# Patient Record
Sex: Male | Born: 1960
Health system: Southern US, Community
[De-identification: ages and names within clinical notes are randomized; demographics above are authoritative.]

## PROBLEM LIST (undated history)

## (undated) DIAGNOSIS — F1910 Other psychoactive substance abuse, uncomplicated: Secondary | ICD-10-CM

## (undated) DIAGNOSIS — I10 Essential (primary) hypertension: Secondary | ICD-10-CM

## (undated) DIAGNOSIS — F419 Anxiety disorder, unspecified: Secondary | ICD-10-CM

## (undated) DIAGNOSIS — K219 Gastro-esophageal reflux disease without esophagitis: Secondary | ICD-10-CM

## (undated) DIAGNOSIS — I2510 Atherosclerotic heart disease of native coronary artery without angina pectoris: Secondary | ICD-10-CM

## (undated) DIAGNOSIS — Z951 Presence of aortocoronary bypass graft: Secondary | ICD-10-CM

## (undated) DIAGNOSIS — F1010 Alcohol abuse, uncomplicated: Secondary | ICD-10-CM

## (undated) DIAGNOSIS — I219 Acute myocardial infarction, unspecified: Secondary | ICD-10-CM

## (undated) DIAGNOSIS — I6529 Occlusion and stenosis of unspecified carotid artery: Secondary | ICD-10-CM

## (undated) DIAGNOSIS — E785 Hyperlipidemia, unspecified: Secondary | ICD-10-CM

## (undated) DIAGNOSIS — I739 Peripheral vascular disease, unspecified: Secondary | ICD-10-CM

## (undated) DIAGNOSIS — I509 Heart failure, unspecified: Secondary | ICD-10-CM

## (undated) DIAGNOSIS — I5042 Chronic combined systolic (congestive) and diastolic (congestive) heart failure: Secondary | ICD-10-CM

## (undated) DIAGNOSIS — R911 Solitary pulmonary nodule: Secondary | ICD-10-CM

## (undated) DIAGNOSIS — I6521 Occlusion and stenosis of right carotid artery: Secondary | ICD-10-CM

## (undated) DIAGNOSIS — I714 Abdominal aortic aneurysm, without rupture: Secondary | ICD-10-CM

## (undated) DIAGNOSIS — I639 Cerebral infarction, unspecified: Secondary | ICD-10-CM

## (undated) HISTORY — DX: Other psychoactive substance abuse, uncomplicated: F19.10

## (undated) HISTORY — DX: Abdominal aortic aneurysm, without rupture: I71.4

## (undated) HISTORY — DX: Peripheral vascular disease, unspecified: I73.9

## (undated) HISTORY — DX: Acute myocardial infarction, unspecified: I21.9

## (undated) HISTORY — DX: Essential (primary) hypertension: I10

## (undated) HISTORY — DX: Occlusion and stenosis of right carotid artery: I65.21

## (undated) HISTORY — DX: Occlusion and stenosis of unspecified carotid artery: I65.29

## (undated) HISTORY — DX: Alcohol abuse, uncomplicated: F10.10

## (undated) HISTORY — DX: Hyperlipidemia, unspecified: E78.5

## (undated) HISTORY — DX: Chronic combined systolic (congestive) and diastolic (congestive) heart failure: I50.42

---

## 2001-01-09 ENCOUNTER — Emergency Department (HOSPITAL_COMMUNITY): Admission: EM | Admit: 2001-01-09 | Discharge: 2001-01-09 | Payer: Self-pay

## 2001-01-10 ENCOUNTER — Emergency Department (HOSPITAL_COMMUNITY): Admission: EM | Admit: 2001-01-10 | Discharge: 2001-01-10 | Payer: Self-pay

## 2001-01-11 ENCOUNTER — Emergency Department (HOSPITAL_COMMUNITY): Admission: EM | Admit: 2001-01-11 | Discharge: 2001-01-11 | Payer: Self-pay | Admitting: Emergency Medicine

## 2001-01-12 ENCOUNTER — Emergency Department (HOSPITAL_COMMUNITY): Admission: EM | Admit: 2001-01-12 | Discharge: 2001-01-12 | Payer: Self-pay | Admitting: Emergency Medicine

## 2002-06-24 ENCOUNTER — Encounter: Payer: Self-pay | Admitting: Emergency Medicine

## 2002-06-24 ENCOUNTER — Emergency Department (HOSPITAL_COMMUNITY): Admission: EM | Admit: 2002-06-24 | Discharge: 2002-06-24 | Payer: Self-pay | Admitting: Emergency Medicine

## 2003-03-08 ENCOUNTER — Emergency Department (HOSPITAL_COMMUNITY): Admission: EM | Admit: 2003-03-08 | Discharge: 2003-03-08 | Payer: Self-pay | Admitting: Emergency Medicine

## 2004-02-25 ENCOUNTER — Emergency Department (HOSPITAL_COMMUNITY): Admission: EM | Admit: 2004-02-25 | Discharge: 2004-02-25 | Payer: Self-pay | Admitting: Emergency Medicine

## 2004-07-08 ENCOUNTER — Emergency Department (HOSPITAL_COMMUNITY): Admission: EM | Admit: 2004-07-08 | Discharge: 2004-07-09 | Payer: Self-pay | Admitting: Emergency Medicine

## 2006-08-25 ENCOUNTER — Emergency Department (HOSPITAL_COMMUNITY): Admission: EM | Admit: 2006-08-25 | Discharge: 2006-08-25 | Payer: Self-pay | Admitting: Emergency Medicine

## 2006-10-03 ENCOUNTER — Emergency Department (HOSPITAL_COMMUNITY): Admission: EM | Admit: 2006-10-03 | Discharge: 2006-10-03 | Payer: Self-pay | Admitting: Emergency Medicine

## 2008-06-30 DIAGNOSIS — I219 Acute myocardial infarction, unspecified: Secondary | ICD-10-CM

## 2008-06-30 DIAGNOSIS — Z951 Presence of aortocoronary bypass graft: Secondary | ICD-10-CM

## 2008-06-30 HISTORY — PX: CORONARY ARTERY BYPASS GRAFT: SHX141

## 2008-06-30 HISTORY — DX: Acute myocardial infarction, unspecified: I21.9

## 2008-06-30 HISTORY — DX: Presence of aortocoronary bypass graft: Z95.1

## 2008-10-05 ENCOUNTER — Inpatient Hospital Stay (HOSPITAL_COMMUNITY): Admission: EM | Admit: 2008-10-05 | Discharge: 2008-10-15 | Payer: Self-pay | Admitting: Emergency Medicine

## 2008-10-05 ENCOUNTER — Ambulatory Visit: Payer: Self-pay | Admitting: Surgery

## 2008-10-05 ENCOUNTER — Ambulatory Visit: Payer: Self-pay | Admitting: Internal Medicine

## 2008-10-06 ENCOUNTER — Encounter: Payer: Self-pay | Admitting: Internal Medicine

## 2008-10-06 ENCOUNTER — Encounter: Payer: Self-pay | Admitting: Surgery

## 2008-10-10 ENCOUNTER — Encounter: Payer: Self-pay | Admitting: Surgery

## 2008-10-11 ENCOUNTER — Encounter: Payer: Self-pay | Admitting: Internal Medicine

## 2008-10-11 ENCOUNTER — Encounter: Payer: Self-pay | Admitting: Surgery

## 2008-10-19 DIAGNOSIS — F1910 Other psychoactive substance abuse, uncomplicated: Secondary | ICD-10-CM

## 2008-10-19 DIAGNOSIS — E785 Hyperlipidemia, unspecified: Secondary | ICD-10-CM

## 2008-10-19 DIAGNOSIS — D62 Acute posthemorrhagic anemia: Secondary | ICD-10-CM

## 2008-10-19 DIAGNOSIS — I25810 Atherosclerosis of coronary artery bypass graft(s) without angina pectoris: Secondary | ICD-10-CM | POA: Insufficient documentation

## 2008-10-19 DIAGNOSIS — I10 Essential (primary) hypertension: Secondary | ICD-10-CM | POA: Insufficient documentation

## 2008-10-19 DIAGNOSIS — M5137 Other intervertebral disc degeneration, lumbosacral region: Secondary | ICD-10-CM

## 2008-10-19 DIAGNOSIS — R079 Chest pain, unspecified: Secondary | ICD-10-CM

## 2008-10-19 HISTORY — DX: Atherosclerosis of coronary artery bypass graft(s) without angina pectoris: I25.810

## 2008-10-20 ENCOUNTER — Ambulatory Visit (HOSPITAL_COMMUNITY): Admission: RE | Admit: 2008-10-20 | Discharge: 2008-10-20 | Payer: Self-pay | Admitting: Surgery

## 2008-10-20 ENCOUNTER — Encounter: Payer: Self-pay | Admitting: Surgery

## 2008-10-30 ENCOUNTER — Ambulatory Visit: Payer: Self-pay | Admitting: Surgery

## 2008-11-07 ENCOUNTER — Encounter: Admission: RE | Admit: 2008-11-07 | Discharge: 2008-11-07 | Payer: Self-pay | Admitting: Surgery

## 2008-11-07 ENCOUNTER — Ambulatory Visit: Payer: Self-pay | Admitting: Surgery

## 2008-11-07 ENCOUNTER — Encounter: Payer: Self-pay | Admitting: Cardiology

## 2008-11-13 ENCOUNTER — Ambulatory Visit: Payer: Self-pay | Admitting: Cardiovascular Disease

## 2008-11-21 ENCOUNTER — Telehealth: Payer: Self-pay | Admitting: Cardiovascular Disease

## 2008-12-12 ENCOUNTER — Emergency Department (HOSPITAL_COMMUNITY): Admission: EM | Admit: 2008-12-12 | Discharge: 2008-12-13 | Payer: Self-pay | Admitting: Emergency Medicine

## 2009-01-16 ENCOUNTER — Encounter: Payer: Self-pay | Admitting: Cardiology

## 2009-12-13 ENCOUNTER — Emergency Department (HOSPITAL_COMMUNITY): Admission: EM | Admit: 2009-12-13 | Discharge: 2009-12-13 | Payer: Self-pay | Admitting: Emergency Medicine

## 2010-03-02 ENCOUNTER — Ambulatory Visit: Payer: Self-pay | Admitting: Interventional Cardiology

## 2010-03-03 ENCOUNTER — Inpatient Hospital Stay (HOSPITAL_COMMUNITY): Admission: EM | Admit: 2010-03-03 | Discharge: 2010-03-04 | Payer: Self-pay | Admitting: Emergency Medicine

## 2010-03-07 ENCOUNTER — Encounter (INDEPENDENT_AMBULATORY_CARE_PROVIDER_SITE_OTHER): Payer: Self-pay | Admitting: *Deleted

## 2010-04-19 ENCOUNTER — Emergency Department (HOSPITAL_COMMUNITY): Admission: EM | Admit: 2010-04-19 | Discharge: 2010-04-19 | Payer: Self-pay | Admitting: Emergency Medicine

## 2010-07-30 NOTE — Letter (Signed)
Summary: Appointment - Missed  [facility], Main Office  [address]   [place], [place] [address]   Phone: [phone]  Fax: [phone]     March 07, 2010 MRN: [id number]   [facility] [address] [place], [place]  [address]   Dear Mr. Grob,  Our records indicate we need to contact you to make an appointment with Dr. Clifton James. It is very important that we reach you to schedule this appointment to follow up on your care since your hospital stay. We look forward to participating in your health care needs. Please contact us at the number listed above at your earliest convenience to reschedule this appointment.     Sincerely, Neurosurgeon Team LG

## 2010-09-12 LAB — DIFFERENTIAL
Eosinophils Absolute: 0.1 10*3/uL (ref 0.0–0.7)
Lymphs Abs: 2 10*3/uL (ref 0.7–4.0)
Neutro Abs: 2.6 10*3/uL (ref 1.7–7.7)
Neutrophils Relative %: 51 % (ref 43–77)

## 2010-09-12 LAB — CBC
MCH: 30.8 pg (ref 26.0–34.0)
Platelets: 200 10*3/uL (ref 150–400)
RBC: 5.81 MIL/uL (ref 4.22–5.81)
WBC: 5 10*3/uL (ref 4.0–10.5)

## 2010-09-12 LAB — POCT CARDIAC MARKERS: Troponin i, poc: <0.05 ng/mL (ref 0.00–0.09)

## 2010-09-12 LAB — BASIC METABOLIC PANEL
Calcium: 9.3 mg/dL (ref 8.4–10.5)
Creatinine, Ser: 0.96 mg/dL (ref 0.4–1.5)
GFR calc Af Amer: >60 mL/min (ref >60)
GFR calc non Af Amer: >60 mL/min (ref >60)

## 2010-09-12 LAB — TROPONIN I: Troponin I: 0.01 ng/mL (ref 0.00–0.06)

## 2010-09-12 LAB — LIPID PANEL
LDL Cholesterol: 214 mg/dL — ABNORMAL HIGH (ref 0–99)
VLDL: 20 mg/dL (ref 0–40)

## 2010-09-12 LAB — D-DIMER, QUANTITATIVE: D-Dimer, Quant: <0.22 ug/mL-FEU (ref 0.00–0.48)

## 2010-09-12 LAB — CARDIAC PANEL(CRET KIN+CKTOT+MB+TROPI)
CK, MB: 2.1 ng/mL (ref 0.3–4.0)
Relative Index: INVALID (ref 0.0–2.5)
Total CK: 42 U/L (ref 7–232)
Total CK: 45 U/L (ref 7–232)
Troponin I: 0.01 ng/mL (ref 0.00–0.06)

## 2010-09-12 LAB — MRSA PCR SCREENING: MRSA by PCR: NEGATIVE

## 2010-09-15 LAB — DIFFERENTIAL
Basophils Relative: 1 % (ref 0–1)
Eosinophils Absolute: 0.2 10*3/uL (ref 0.0–0.7)
Neutrophils Relative %: 48 % (ref 43–77)

## 2010-09-15 LAB — CBC
Platelets: 166 10*3/uL (ref 150–400)
WBC: 6.2 10*3/uL (ref 4.0–10.5)

## 2010-09-15 LAB — PROTIME-INR
INR: 1.06 (ref 0.00–1.49)
Prothrombin Time: 13.7 seconds (ref 11.6–15.2)

## 2010-09-15 LAB — CK TOTAL AND CKMB (NOT AT ARMC)
CK, MB: 1.6 ng/mL (ref 0.3–4.0)
Relative Index: INVALID (ref 0.0–2.5)

## 2010-09-15 LAB — TROPONIN I: Troponin I: 0.01 ng/mL (ref 0.00–0.06)

## 2010-09-15 LAB — APTT: aPTT: 29 seconds (ref 24–37)

## 2010-09-15 LAB — BASIC METABOLIC PANEL
BUN: 6 mg/dL (ref 6–23)
Calcium: 9.1 mg/dL (ref 8.4–10.5)
Creatinine, Ser: 0.91 mg/dL (ref 0.4–1.5)
GFR calc non Af Amer: >60 mL/min (ref >60)

## 2010-10-07 LAB — POCT I-STAT, CHEM 8
BUN: 10 mg/dL (ref 6–23)
Creatinine, Ser: 1.2 mg/dL (ref 0.4–1.5)
Glucose, Bld: 85 mg/dL (ref 70–99)
Hemoglobin: 14.6 g/dL (ref 13.0–17.0)
TCO2: 19 mmol/L (ref 0–100)

## 2010-10-07 LAB — CBC
Hemoglobin: 14.1 g/dL (ref 13.0–17.0)
MCHC: 32.8 g/dL (ref 30.0–36.0)
MCV: 86.4 fL (ref 78.0–100.0)
RBC: 4.97 MIL/uL (ref 4.22–5.81)
WBC: 6.2 10*3/uL (ref 4.0–10.5)

## 2010-10-07 LAB — POCT CARDIAC MARKERS: CKMB, poc: <1 ng/mL — ABNORMAL LOW (ref 1.0–8.0)

## 2010-10-09 LAB — BASIC METABOLIC PANEL
CO2: 27 mEq/L (ref 19–32)
CO2: 32 mEq/L (ref 19–32)
CO2: 28 mEq/L (ref 19–32)
CO2: 29 mEq/L (ref 19–32)
CO2: 30 mEq/L (ref 19–32)
Calcium: 8.6 mg/dL (ref 8.4–10.5)
Calcium: 9.4 mg/dL (ref 8.4–10.5)
Chloride: 100 mEq/L (ref 96–112)
Chloride: 105 mEq/L (ref 96–112)
Chloride: 103 mEq/L (ref 96–112)
Chloride: 108 mEq/L (ref 96–112)
Creatinine, Ser: 0.77 mg/dL (ref 0.4–1.5)
Creatinine, Ser: 0.92 mg/dL (ref 0.4–1.5)
Creatinine, Ser: 1.19 mg/dL (ref 0.4–1.5)
GFR calc Af Amer: >60 mL/min (ref >60)
GFR calc Af Amer: >60 mL/min (ref >60)
GFR calc Af Amer: >60 mL/min (ref >60)
GFR calc Af Amer: >60 mL/min (ref >60)
GFR calc Af Amer: >60 mL/min (ref >60)
GFR calc non Af Amer: 60 mL/min — ABNORMAL LOW (ref >60)
GFR calc non Af Amer: >60 mL/min (ref >60)
GFR calc non Af Amer: >60 mL/min (ref >60)
Potassium: 3.5 mEq/L (ref 3.5–5.1)
Potassium: 4.1 mEq/L (ref 3.5–5.1)
Potassium: 4 mEq/L (ref 3.5–5.1)
Potassium: 4.2 mEq/L (ref 3.5–5.1)
Sodium: 137 mEq/L (ref 135–145)
Sodium: 136 mEq/L (ref 135–145)
Sodium: 139 mEq/L (ref 135–145)
Sodium: 139 mEq/L (ref 135–145)

## 2010-10-09 LAB — HEPATIC FUNCTION PANEL
ALT: 16 U/L (ref 0–53)
AST: 17 U/L (ref 0–37)
Albumin: 3.5 g/dL (ref 3.5–5.2)
Alkaline Phosphatase: 80 U/L (ref 39–117)
Total Protein: 6.2 g/dL (ref 6.0–8.3)

## 2010-10-09 LAB — POCT I-STAT 4, (NA,K, GLUC, HGB,HCT)
Glucose, Bld: 86 mg/dL (ref 70–99)
Glucose, Bld: 89 mg/dL (ref 70–99)
Glucose, Bld: 97 mg/dL (ref 70–99)
Glucose, Bld: 99 mg/dL (ref 70–99)
HCT: 28 % — ABNORMAL LOW (ref 39.0–52.0)
HCT: 29 % — ABNORMAL LOW (ref 39.0–52.0)
HCT: 31 % — ABNORMAL LOW (ref 39.0–52.0)
HCT: 42 % (ref 39.0–52.0)
Hemoglobin: 10.5 g/dL — ABNORMAL LOW (ref 13.0–17.0)
Hemoglobin: 14.3 g/dL (ref 13.0–17.0)
Hemoglobin: 15 g/dL (ref 13.0–17.0)
Hemoglobin: 9.5 g/dL — ABNORMAL LOW (ref 13.0–17.0)
Potassium: 4.4 mEq/L (ref 3.5–5.1)
Potassium: 4.5 mEq/L (ref 3.5–5.1)
Potassium: 5.5 mEq/L — ABNORMAL HIGH (ref 3.5–5.1)
Potassium: 5.5 mEq/L — ABNORMAL HIGH (ref 3.5–5.1)
Potassium: 6.1 mEq/L — ABNORMAL HIGH (ref 3.5–5.1)
Sodium: 135 mEq/L (ref 135–145)
Sodium: 139 mEq/L (ref 135–145)

## 2010-10-09 LAB — RAPID URINE DRUG SCREEN, HOSP PERFORMED
Amphetamines: NOT DETECTED
Barbiturates: NOT DETECTED
Benzodiazepines: NOT DETECTED
Tetrahydrocannabinol: POSITIVE — AB

## 2010-10-09 LAB — GLUCOSE, CAPILLARY
Glucose-Capillary: 102 mg/dL — ABNORMAL HIGH (ref 70–99)
Glucose-Capillary: 104 mg/dL — ABNORMAL HIGH (ref 70–99)
Glucose-Capillary: 109 mg/dL — ABNORMAL HIGH (ref 70–99)
Glucose-Capillary: 112 mg/dL — ABNORMAL HIGH (ref 70–99)
Glucose-Capillary: 121 mg/dL — ABNORMAL HIGH (ref 70–99)
Glucose-Capillary: 104 mg/dL — ABNORMAL HIGH (ref 70–99)
Glucose-Capillary: 116 mg/dL — ABNORMAL HIGH (ref 70–99)
Glucose-Capillary: 125 mg/dL — ABNORMAL HIGH (ref 70–99)
Glucose-Capillary: 98 mg/dL (ref 70–99)

## 2010-10-09 LAB — URINALYSIS, ROUTINE W REFLEX MICROSCOPIC
Bilirubin Urine: NEGATIVE
Glucose, UA: NEGATIVE mg/dL
Hgb urine dipstick: NEGATIVE
Protein, ur: NEGATIVE mg/dL
Urobilinogen, UA: 0.2 mg/dL (ref 0.0–1.0)

## 2010-10-09 LAB — CROSSMATCH: ABO/RH(D): O POS

## 2010-10-09 LAB — CREATININE, SERUM
Creatinine, Ser: 0.9 mg/dL (ref 0.4–1.5)
Creatinine, Ser: 1.01 mg/dL (ref 0.4–1.5)
GFR calc Af Amer: >60 mL/min (ref >60)
GFR calc Af Amer: >60 mL/min (ref >60)
GFR calc non Af Amer: >60 mL/min (ref >60)

## 2010-10-09 LAB — POCT I-STAT 3, ART BLOOD GAS (G3+)
Acid-base deficit: 2 mmol/L (ref 0.0–2.0)
Bicarbonate: 23.9 mEq/L (ref 20.0–24.0)
Bicarbonate: 24.1 mEq/L — ABNORMAL HIGH (ref 20.0–24.0)
Bicarbonate: 24.6 mEq/L — ABNORMAL HIGH (ref 20.0–24.0)
O2 Saturation: 100 %
O2 Saturation: 96 %
O2 Saturation: 97 %
Patient temperature: 34.7
Patient temperature: 36.3
Patient temperature: 36.3
TCO2: 25 mmol/L (ref 0–100)
TCO2: 25 mmol/L (ref 0–100)
TCO2: 26 mmol/L (ref 0–100)
TCO2: 26 mmol/L (ref 0–100)
pCO2 arterial: 41.9 mmHg (ref 35.0–45.0)
pCO2 arterial: 44.5 mmHg (ref 35.0–45.0)
pH, Arterial: 7.353 (ref 7.350–7.450)
pO2, Arterial: 61 mmHg — ABNORMAL LOW (ref 80.0–100.0)

## 2010-10-09 LAB — CBC
HCT: 26.8 % — ABNORMAL LOW (ref 39.0–52.0)
HCT: 29.6 % — ABNORMAL LOW (ref 39.0–52.0)
HCT: 46.3 % (ref 39.0–52.0)
Hemoglobin: 9.5 g/dL — ABNORMAL LOW (ref 13.0–17.0)
Hemoglobin: 10.5 g/dL — ABNORMAL LOW (ref 13.0–17.0)
Hemoglobin: 15.9 g/dL (ref 13.0–17.0)
Hemoglobin: 16.4 g/dL (ref 13.0–17.0)
Hemoglobin: 9.8 g/dL — ABNORMAL LOW (ref 13.0–17.0)
MCHC: 35.6 g/dL (ref 30.0–36.0)
MCHC: 33 g/dL (ref 30.0–36.0)
MCHC: 34.6 g/dL (ref 30.0–36.0)
MCHC: 35 g/dL (ref 30.0–36.0)
MCV: 88.7 fL (ref 78.0–100.0)
MCV: 88.1 fL (ref 78.0–100.0)
MCV: 89 fL (ref 78.0–100.0)
MCV: 89.1 fL (ref 78.0–100.0)
Platelets: 133 10*3/uL — ABNORMAL LOW (ref 150–400)
Platelets: 135 10*3/uL — ABNORMAL LOW (ref 150–400)
Platelets: 143 10*3/uL — ABNORMAL LOW (ref 150–400)
RBC: 3.02 MIL/uL — ABNORMAL LOW (ref 4.22–5.81)
RBC: 3.07 MIL/uL — ABNORMAL LOW (ref 4.22–5.81)
RBC: 3.16 MIL/uL — ABNORMAL LOW (ref 4.22–5.81)
RBC: 3.2 MIL/uL — ABNORMAL LOW (ref 4.22–5.81)
RBC: 5.1 MIL/uL (ref 4.22–5.81)
RBC: 5.11 MIL/uL (ref 4.22–5.81)
RDW: 13.9 % (ref 11.5–15.5)
WBC: 7.3 10*3/uL (ref 4.0–10.5)
WBC: 5.8 10*3/uL (ref 4.0–10.5)
WBC: 6.4 10*3/uL (ref 4.0–10.5)
WBC: 7.5 10*3/uL (ref 4.0–10.5)
WBC: 7.5 10*3/uL (ref 4.0–10.5)
WBC: 7.6 10*3/uL (ref 4.0–10.5)
WBC: 8.2 10*3/uL (ref 4.0–10.5)

## 2010-10-09 LAB — MAGNESIUM
Magnesium: 2.2 mg/dL (ref 1.5–2.5)
Magnesium: 2.2 mg/dL (ref 1.5–2.5)

## 2010-10-09 LAB — POCT I-STAT GLUCOSE
Operator id: 173791
Operator id: 3406

## 2010-10-09 LAB — CARDIAC PANEL(CRET KIN+CKTOT+MB+TROPI)
CK, MB: 1.3 ng/mL (ref 0.3–4.0)
CK, MB: 1.5 ng/mL (ref 0.3–4.0)
Relative Index: INVALID (ref 0.0–2.5)
Relative Index: INVALID (ref 0.0–2.5)
Relative Index: INVALID (ref 0.0–2.5)
Total CK: 40 U/L (ref 7–232)
Total CK: 43 U/L (ref 7–232)
Total CK: 44 U/L (ref 7–232)
Total CK: 46 U/L (ref 7–232)
Troponin I: <0.01 ng/mL (ref 0.00–0.06)
Troponin I: <0.01 ng/mL (ref 0.00–0.06)
Troponin I: <0.01 ng/mL (ref 0.00–0.06)

## 2010-10-09 LAB — PREPARE PLATELETS

## 2010-10-09 LAB — POCT I-STAT, CHEM 8
BUN: 9 mg/dL (ref 6–23)
Calcium, Ion: 1.12 mmol/L (ref 1.12–1.32)
Chloride: 105 mEq/L (ref 96–112)
Creatinine, Ser: 1.2 mg/dL (ref 0.4–1.5)
Sodium: 137 mEq/L (ref 135–145)

## 2010-10-09 LAB — DIFFERENTIAL
Basophils Absolute: 0.1 10*3/uL (ref 0.0–0.1)
Eosinophils Relative: 5 % (ref 0–5)
Lymphocytes Relative: 25 % (ref 12–46)
Lymphs Abs: 1.9 10*3/uL (ref 0.7–4.0)
Monocytes Absolute: 0.5 10*3/uL (ref 0.1–1.0)
Neutro Abs: 4.7 10*3/uL (ref 1.7–7.7)

## 2010-10-09 LAB — LIPID PANEL
Cholesterol: 287 mg/dL — ABNORMAL HIGH (ref 0–200)
LDL Cholesterol: 206 mg/dL — ABNORMAL HIGH (ref 0–99)
Total CHOL/HDL Ratio: 8 RATIO
Triglycerides: 225 mg/dL — ABNORMAL HIGH (ref <150)

## 2010-10-09 LAB — POCT CARDIAC MARKERS
Myoglobin, poc: 34 ng/mL (ref 12–200)
Myoglobin, poc: 53.1 ng/mL (ref 12–200)
Troponin i, poc: <0.05 ng/mL (ref 0.00–0.09)

## 2010-10-09 LAB — CK TOTAL AND CKMB (NOT AT ARMC): Total CK: 54 U/L (ref 7–232)

## 2010-10-09 LAB — PLATELET COUNT: Platelets: 120 10*3/uL — ABNORMAL LOW (ref 150–400)

## 2010-11-12 NOTE — Consult Note (Signed)
NAMEHAMMAD, FINKLER NO.:  [id number]   MEDICAL RECORD NO.:  [id number]          PATIENT TYPE:  INP   LOCATION:  4734                         FACILITY:  [facility]   PHYSICIAN:  Evelene Croon, M.D.     DATE OF BIRTH:  1960/07/29   DATE OF CONSULTATION:  10/07/2008  DATE OF DISCHARGE:                                 CONSULTATION   REFERRING PHYSICIAN:  Arturo Morton. Riley Kill, MD, [facility]   REASON FOR CONSULTATION:  Severe three-vessel coronary artery disease.   CLINICAL HISTORY:  I was asked by Dr. Riley Kill to evaluate Mr. Rehberg for  consideration of coronary artery bypass graft surgery.  He is a 50-year-  old gentleman with a history of cigarette smoking as well as  polysubstance abuse including marijuana, ethanol, and cocaine.  He  reports having a several-month history of intermittent left-sided chest  pain, which he describes as sharp.  This has typically not been  associated with same types of activity and have occurred with exertion  and rest.  He was admitted on October 05, 2008, after presenting with an  acute episode of sharp left-sided chest pain occurring after standing or  putting on his boots.  He described the pain as worse with the call for  deep breathing, it was also made worse by twisting motion or leaning  over.  His cardiac enzymes have been negative.  His initial  electrocardiogram showed no acute changes, but the followup  echocardiogram on October 06, 2008, showed new T-wave inversions  inferiorly.  He has continued to have chest pain since admission, which  has been fairly constant and not responding very well to pain medicine.  He underwent cardiac catheterization on October 06, 2008, by Dr. Riley Kill,  which showed three-vessel coronary artery disease.  The LAD was  diffusely diseased proximally with 80% stenosis.  There is a small first  diagonal branch at 90% proximal stenosis.  The LAD also had about 80%  midvessel stenosis.  The left circumflex had 60%  proximal stenosis and  then gave off a large marginal branch and had about 50% stenosis.  The  AV groove portion left circumflex had 50% stenosis.  The right coronary  artery was diffusely diseased with 80% and 90% proximal and mid vessel  stenosis.  There was about 70% stenosis from the down before the  posterior descending branch.  There was a small posterior descending and  a couple small posterior lateral branches distally.  Ejection fraction  was about 45% with global hypokinesis.   REVIEW OF SYSTEMS:  GENERAL:  He denies any fever or chills.  He has had  no recent weight changes.  He does report some fatigue.  EYES:  Negative.  ENT:  Negative.  ENDOCRINE:  He denies diabetes and  hypothyroidism.  CARDIOVASCULAR:  As above.  He denies any history of  PND or orthopnea.  He denies exertional dyspnea.  He has had no  peripheral edema or palpitations.  RESPIRATORY:  He denies cough and  sputum production.  GI:  He denies nausea, vomiting.  He denies melena  and bright red blood per rectum.  GU:  He denies dysuria and hematuria.  MUSCULOSKELETAL:  He denies arthralgias and myalgias.  He does have some  lower back pain.  NEUROLOGIC:  He denies any focal weakness.  He does  have numbness in his left leg and hip down, which has been a chronic  problem for him related to lumbar disk disease.  He was told in the past  that he needed to have surgery for his lumbar disk disease, but refused  to have it.  He denies any history of TIA or stroke.  HEMATOLOGIC:  Negative.   ALLERGIES:  Negative.   MEDICATIONS:  None.   PAST MEDICAL HISTORY:  Significant for hypertension.  He has a history  of chronic back pain due to lumbar disk disease.  He has had no previous  surgery.  He did have spider bite in the past that was treated as an  outpatient with antibiotics.  He has not been hospitalized.  He does not  have a primary physician.   SOCIAL HISTORY:  He lives in [place].  He works in  [employer].  He  is not married, but has a significant other.  He smoked 1 pack of  cigarette per day for 30 years.  He drinks about 6-pack per day of  alcohol and beer.  He smokes marijuana frequently and denies cocaine  use, but had positive drug screen.   FAMILY HISTORY:  Positive for history of hypertension and diabetes.   PHYSICAL EXAMINATION:  VITAL SIGNS:  His blood pressure is 140/84, his  pulse is 55 and regular, respiratory rate is 20 and unlabored.  Oxygen  saturation is 97% on room air.  GENERAL:  He is a well-developed white male who looks slightly older  than his stated age.  HEENT:  Normocephalic and atraumatic.  Pupils are equal and reactive to  light and accommodation.  Extraocular muscles are intact.  His throat is  clear.  Teeth are in poor condition.  NECK:  Normal carotid pulses bilaterally.  There are no bruits.  There  is no adenopathy or thyromegaly.  CARDIAC:  Regular rate and rhythm with normal S1 and S2.  There is no  murmur, rub, or gallop.  LUNGS:  Clear.  ABDOMEN:  Active bowel sounds.  His abdomen is soft, flat and nontender.  There are no palpable masses or organomegaly.  EXTREMITIES:  No peripheral edema.  Pedal pulses are palpable in the  right foot.  Dorsalis pedis and posterior tibialis diminished, but  palpable in the left foot.  SKIN:  Warm and dry.  NEUROLOGIC:  Alert and oriented x3.  Motor and sensory exam is grossly  normal.   LABORATORY EXAMINATION:  Laboratory examination reveals normal  electrolytes with BUN of 12, creatinine of 0.94 on admission.  White  blood cell count was 7.5 with hemoglobin of 16.4, hematocrit 46.3, and  platelet count 183.  Cardiac enzymes were negative.  Liver function  profile was within normal limits with an albumin of 3.5.  His hemoglobin  A1c was 5.3.  His drug screen was positive for opiates and cocaine as  well as THC.   IMPRESSION:  Mr. Santillana has severe three-vessel coronary artery disease   presenting with left-sided chest pain.  He ruled out for myocardial  infarction.  He has had some new inferior EKG changes since admission.  His symptoms are unusual for angina and that they have been persistent  and made worse by deep  breathing and position changes, which gives him  more of a pleuritic or pericardial quality.  It is certainly possible  that he could have some element of pericarditis causing persistent pain.  I agree that coronary artery bypass surgery is the best treatment to  prevent further ischemia and infarction.  I have discussed the operative  procedure with the patient and his family.  We discussed alternatives,  benefits, and risks including, but not limited to bleeding, blood  transfusion, infection, stroke, myocardial infarction, graft failure,  and death.  He understands and agrees to proceed.  We will tentatively  plan to do surgery next Wednesday, which is the first available day.  He  did receive 300 mg Plavix on presentation.      Evelene Croon, M.D.  Electronically Signed     BB/MEDQ  D:  10/07/2008  T:  10/08/2008  Job:  [id number]

## 2010-11-12 NOTE — Assessment & Plan Note (Signed)
OFFICE VISIT   Randy, [name]  DOB:  1960-08-26                                        Nov 07, 2008  CHART #:  [id number]   The patient returned today for followup status post coronary artery  bypass graft surgery x4 on October 11, 2008.  He has been feeling well  overall since discharge.  He has continued to abstain from smoking.   PHYSICAL EXAMINATION:  VITAL SIGNS:  Blood pressure is 116/72, pulse is  72 and regular, and his respiratory rate is 18 and nonlabored.  Oxygen  saturation on room air is 99%.  GENERAL:  He looks well.  CARDIAC:  Regular rate and rhythm with normal heart sounds.  LUNGS:  Clear.  CHEST:  Incision is healing well and the sternum is stable.  EXTREMITIES:  His leg incision is healing well.  There is no peripheral  edema.   Followup chest x-ray today shows clear lung fields and no pleural  effusions.   MEDICATIONS:  1. Lopressor 25 mg b.i.d.  2. Lisinopril 2.5 mg daily.  3. Zocor 40 mg daily.  4. Alprazolam 0.25 mg b.i.d. p.r.n.  5. Aspirin 81 mg daily.  6. Ultram p.r.n. for pain.   IMPRESSION:  Overall, the patient is recovering well following his  surgery.  I encouraged him to continue abstaining from smoking and to  continue walking as much as possible.  I told him he can return to  driving a car at this time, but should refrain from lifting anything  heavier than 10  pounds for a total of 3 months from date of surgery.  He has a followup  appointment to see Dr. Daleen [name] on Nov 13, 2008, and will contact me if he  develops any problems with his incision.   Randy [name], M.D.  Electronically Signed   BB/MEDQ  D:  11/07/2008  T:  11/08/2008  Job:  [id number]   cc:   Randy C. Wall, MD, [facility]

## 2010-11-12 NOTE — Op Note (Signed)
Randy [name], Randy [name]                ACCOUNT NO.:  [id number]   MEDICAL RECORD NO.:  [id number]          PATIENT TYPE:  INP   LOCATION:  2310                         FACILITY:  [facility]   PHYSICIAN:  Evelene Croon, M.D.     DATE OF BIRTH:  06-Oct-1960   DATE OF PROCEDURE:  10/11/2008  DATE OF DISCHARGE:                               OPERATIVE REPORT   PREOPERATIVE DIAGNOSIS:  Severe three-vessel coronary artery disease.   POSTOPERATIVE DIAGNOSIS:  Severe three-vessel coronary artery disease.   OPERATIVE PROCEDURE:  Median sternotomy, extracorporeal circulation,  coronary bypass graft surgery x4 using a left internal mammary artery  graft to left anterior descending coronary, with a saphenous vein graft  to diagonal branch of the LAD, a saphenous vein graft to the obtuse  marginal branch of the left circumflex coronary, and a saphenous vein  graft to the posterior descending branch of the right coronary.  Endoscopic vein harvesting from the right leg.   ATTENDING SURGEON:  Evelene Croon, MD   ASSISTANT:  Rowe Clack, P.A.-C.   ANESTHESIA:  General endotracheal.   CLINICAL HISTORY:  This patient is a 50 year old gentleman with history  of cigarette smoking as well as polysubstance abuse including marijuana,  ethanol, and cocaine who presented with reports of several month several-  month history of intermittent left-sided chest pain which is described  as sharp.  This has typically not been associated with the same type of  activity and has recurred with exertion and rest.  He was admitted on  October 05, 2008, after presenting with an acute episode of sharp left-  sided chest pain occurring after standing up after putting on his boots.  He described the pain as being worse with deep breathing.  His cardiac  enzymes were negative.  His initial electrocardiogram showed no acute  changes but the followup echocardiogram showed new T-wave inversions  inferiorly.  He continued to have chest  pain while in the hospital which  was more poor pleuritic in nature, worsening with position changes and  with deep breathing.  He underwent cardiac catheterization on October 07, 2007, by Dr. Riley Kill, which showed a three-vessel coronary artery  disease.  The LAD was diffusely diseased proximally with 80% stenosis.  There was small first diagonal branch that had 90% ostial and proximal  stenosis.  The LAD also had about 80% midvessel stenosis.  The left  circumflex had 60% proximal stenosis and then gave off a large marginal  branch that about 50% stenosis.  The right coronary artery was diffusely  diseased with 80% and 90% proximal and mid vessel stenosis.  There is  about 70% stenosis from the mid-to-distal portion before the posterior  descending branch.  The posterior descending and posterolateral branches  were fairly small vessels that had diffuse irregularities.  Ejection  fraction about 45% with global hypokinesis.  After review of the  angiogram examination, the patient was felt that coronary bypass graft  surgery was probably the best treatment.  The patient had preoperative  carotid Dopplers that showed greater than 80% a long right internal  carotid artery stenosis as well as a 60%-80% left internal carotid  artery stenosis.  He was seen in consultation by Dr. Myra Gianotti of vascular  surgery and Dr. Myra Gianotti felt that given the findings on his Doppler  examination and possibly some symptoms of cerebral ischemia but the  patient reported that he should have concomitant right carotid  endarterectomy.  I discussed the operative procedure of coronary bypass  surgery with he and his family including alternatives, benefits, and  risks including but not limited to bleeding, blood transfusion,  infection, stroke, myocardial infarction, graft failure, and death.  Also discussed the importance of maximum cardiac risk factor reduction  including complete smoking cessation.  He said he  understood all this  and agreed to proceed.   OPERATIVE PROCEDURE:  The patient was taken operating room and placed on  table in supine position.  After induction of general endotracheal  anesthesia, a Foley catheter was placed in bladder using sterile  technique.  Then the chest, abdomen and both lower extremities were  prepped and draped in usual sterile manner.  The right neck was prepped  into the operative field.  Preoperative intravenous antibiotics were  given.  Then, right carotid endarterectomy was performed by Dr. Durene Cal and will be dictated by him.  At the conclusion of that  procedure, the right neck was packed with gauze and closed with  interrupted silk sutures with hemostasis.  Then attention was turned to  coronary bypass surgery.  The chest was entered through a median  sternotomy incision.  Pericardium was opened in midline.  Examination  heart showed good ventricular contractility.  The ascending aorta had no  palpable plaques in it.  The patient did have a barrel-shaped chest with  obvious COPD.   Then, the left internal mammary artery was harvested from the chest wall  as pedicle graft.  This is a medium caliber vessel with excellent blood  flow through it.  At the same time, a segment of greater saphenous vein  was harvested from the right leg using endoscopic vein harvest  technique.  This vein was of medium size and good quality.   Then the patient was heparinized and when an adequate activated clotting  time was achieved, the distal ascending aorta was cannulated using a 20-  [place] aortic cannula for arterial inflow.  Venous outflow was achieved  using a 2-stage venous cannula for the right atrial appendage.  Antegrade cardioplegia and vent cannula was inserted aortic root.   The patient placed on cardiopulmonary bypass and distal coronaries  identified.  The LAD was a moderate-sized graftable vessel that was  diffusely diseased in this disease  extended down to the distal vessel.  The diagonal branch was a small moderate-sized graftable vessel that was  also diffusely diseased.  The obtuse marginal branch was intramyocardial  throughout its proximal and midportions.  It was located distally as the  exit muscle where it was suitable for grafting.  There were still some  patchy distal disease beyond this.  The right coronary artery was  diffusely diseased with a calcific plaque.  This extended down to the  distal vessel beyond the posterior descending branch.  The posterior  descending branch itself was suitable for grafting and had minimal  disease in it.  The posterolateral branches were small nongraftable  vessel vessels.  They appeared to communicate well with the posterior  descending on catheterization.   Then the aorta was cross-clamped and 500 mL of cold  blood antegrade  cardioplegia was administered in the aortic root with quick arrest the  heart.  Systemic hypothermia to 20 degrees centigrade and topical  hypothermia with iced saline was used.  A temperature probe was placed  in septum and an insulating pad in the pericardium.   The first distal anastomosis was performed to the obtuse marginal  branch.  The internal diameter distally was still about 1.75 mm.  The  conduit used was a segment of greater saphenous vein anastomosis was  performed in end-to-side manner using continuous 7-0 Prolene suture.  Flow was noted through the graft and was excellent.   The second distal anastomosis was performed to the posterior descending  coronary.  The internal diameter of this vessel was about 1.6 mm.  The  conduit used was the second segment of greater saphenous vein and the  anastomosis was performed in an end-to-side manner using continuous 7-0  Prolene suture.  Flow was noted through the graft and was excellent.  Then another dose of cardioplegia given down the vein grafts and the  aortic root.   The third distal  anastomosis was performed to the diagonal branch.  The  internal diameter was about 1.5 mm.  The conduit used was  third segment  greater saphenous vein and the anastomosis was performed in an end-to-  side manner using continuous 7-0 Prolene suture.  Flow was noted through  the graft and was excellent.   Fourth distal anastomosis was performed to the midportion of the left  anterior descending coronary.  The internal diameter of this  vessel was  about 1.75 mm.  The conduit used was the left internal mammary graft and  this was brought through an opening a left pericardium and anterior to  the phrenic nerve.  It was anastomosed the LAD in an end-to-side manner  using continuous 8-0 Prolene suture.  The pedicle was sutured to the  epicardium with 6-0 Prolene sutures.  The patient was then rewarmed to  37 degrees centigrade.  With the cross-clamp in place, the 3 proximal  vein graft with anastomoses were performed through the aortic root end-  to-side manner using continuous 8-0 Prolene suture.  The pedicle was  sutured to the epicardium with 6-0 Prolene sutures.  The patient was  then rewarmed to 37 degrees centigrade.  Then, the clamp was removed  from mammary pedicle.  There was rapid warming of the ventricular septum  and return of spontaneous ventricular fibrillation.  The cross-clamp was  removed with the time of 73 minutes.  The patient was returned to sinus  rhythm.  The proximal and distal anastomoses appeared hemostatic and  allowed the grafts satisfactory.  The graft markers were placed around  the proximal anastomoses.  Two temporary right ventricular and right  atrial pacing wires placed and brought out through the skin.   When the patient rewarmed to 37 degrees centigrade, he was weaned from  cardiopulmonary bypass on no inotropic agents.  Total bypass time was 89  minutes.  The cardiac function appeared excellent with a cardiac output  of 5 liters per minute.  Protamine was  given and the venous and aortic  cannulas were removed without difficult difficulty.  Hemostasis was  achieved.  Three chest tubes were placed with one in the posterior  pericardium, one in the left pleural space and one in the anterior  mediastinum.  The pericardium was closed over the heart.  The sternum  was closed with #6 stainless steel wires.  The fascia was closed with  continuous #1 Vicryl suture.  The subcutaneous tissue was closed with  continuous 2-0 Vicryl and skin with a 3-0 Vicryl subcuticular closure.  The lower extremity vein harvest site was closed in layers in similar  manner.   Then attention was turned to the right neck incision.  The silk sutures  were removed and the gauze packing removed.  There was complete  hemostasis of the carotid patch.  There was good pulsation in the  internal carotid artery beyond the patch.  The Doppler were used and  there was a good Doppler signal in the carotid artery distal to the  patch.  Then, a 15-French Blake drain was brought through a separate  stab incision and positioned in the bed of the carotid artery.  The  wound was closed in layers using continuous 2-0 Vicryl to close the  muscle fascia, 3-0 Vicryl to reapproximate the platysma muscle and 3-0  Vicryl subcuticular skin closure.  Sponge, needle and counts were  correct according to the scrub nurse.  A dry sterile dressing was  applied over the incisions and around the tubes.  The chest tubes were  connected to Pleur-Evac suction and the neck drain to bulb suction.  The  patient remained hemodynamically stable, transferred to the SICU in  guarded but stable condition.      Evelene Croon, M.D.  Electronically Signed     BB/MEDQ  D:  10/11/2008  T:  10/12/2008  Job:  [id number]   cc:   [name], MD

## 2010-11-12 NOTE — Op Note (Signed)
NAME:  Randy [name], Randy [name]                ACCOUNT NO.:  [id number]   MEDICAL RECORD NO.:  [id number]          PATIENT TYPE:  INP   LOCATION:  2310                         FACILITY:  [facility]   PHYSICIAN:  Juleen China IV, MDDATE OF BIRTH:  [birth year]   DATE OF PROCEDURE:  10/11/2008  DATE OF DISCHARGE:                               OPERATIVE REPORT   PREOPERATIVE DIAGNOSIS:  Symptomatic right carotid stenosis.   POSTOPERATIVE DIAGNOSIS:  Symptomatic right carotid stenosis.   PROCEDURE PERFORMED:  Right carotid endarterectomy with patch  angioplasty.   TYPE OF ANESTHESIA:  General.   BLOOD LOSS:  100 mL.   COMPLICATIONS:  None.   FINDINGS:  95% stenosis.  The plaque extended approximately 3 cm into  the internal carotid artery.   SPECIMENS:  Plaque.   ASSISTANT:  Wilmon Arms, PA   SURGEON:  1. Charlena Cross, MD   INDICATIONS:  Mr. Randy [name] is a 50 year old gentleman who was admitted  with chest pain.  His pre-CABG workup including carotid Dopplers, which  revealed a high-grade right carotid stenosis and moderate left carotid  stenosis.  After specific questioning regarding stroke symptoms, the  patient did describe a couple of episodes of slurred speech and trouble  getting his thoughts out.  I felt that this potentially represented  TIAs.  The most recent of his symptoms was over a month ago.  For that  reason, I have elected to proceed with carotid endarterectomy at the  time of his coronary bypass.   PROCEDURE:  The patient was identified in the holding area and taken to  room 12.  He was placed supine on the table.  General endotracheal  anesthesia was administered.  The patient was prepped and draped in  standard sterile fashion.  A time-out was called.  Antibiotics were  given.  Incision was made along the anterior border of the right  sternocleidomastoid.  Cautery was used to divide the subcutaneous  tissue.  The platysma muscle was divided with cautery.  The  internal  jugular vein was identified on its anterior medial border.  It was  traced cephalad until the common facial vein was identified.  I then  opened the carotid sheath above the common carotid artery.  Common  carotid artery was then fully exposed and mobilized down to the omohyoid  muscle.  An umbilical tape was passed.  The common facial vein was  dissected free and divided between 2-0 silk ties.  The superior thyroid  artery was identified and encircled with a 2-0 silk tie.  The external  carotid artery was identified and mobilized proximally and encircled  with blue vessel loop.  Next, proceeding cephalad, I identified the  hypoglossal nerve.  The nerve had to be fully mobilized in order to get  to the end of the plaque.  The plaque extended well up into his internal  carotid above the level of the hypoglossal nerve.  An [employer]  retractor was used to facilitate exposure.  Once I was able to dissect  the internal carotid artery above the plaque, it was encircled with  an  umbilical tape.  At this point in time, the patient was given systemic  heparinization.  Due to the high location of his plaque, I elected to  check a stump pressure to see if a shunt would be required.  The common  carotid artery was occluded followed by the external carotid artery and  the superior thyroid artery.  A 21-gauge needle was placed in the common  carotid artery and a stump pressure was checked.  The mean pressure was  28.  Based on this value, I felt that I should attempt to perform his  endarterectomy with a shunt in place.  Next, the internal carotid artery  was occluded with a Gregory clamp.  A #11 blade was used to make an  arteriotomy, which was extended along the anterior lateral surface of  the common and internal carotid artery.  Once I had opened the artery  beyond the plaque, a 10-French shunt was used.  The stenosis was  approximately 90%.  An endarterectomy was performed using a  Kleinert-  Administrator, arts.  Eversion endarterectomy was performed in the external  carotid artery.  A good distal endpoint was encountered and the plaque  was removed.  I did not feel like I could get further up on the internal  carotid artery and even though all the plaque was removed, I felt that  two tacking sutures should be placed.  These were done with 7-0 Prolene.  Next, the endarterectomized plane was irrigated with heparinized saline.  All potential embolic debris was removed.  A bovine pericardial patch  was selected.  Patch angioplasty was performed with a running 6-0  Prolene suture.  Prior to completion of the anastomosis, the shunt was  removed.  The internal, external, and common carotid arteries were all  flushed.  Heparinized saline was then again used to irrigate the artery.  The patch repair was then completed.  Clamp was removed from the  external carotid artery followed by the common carotid artery.  Approximately 15 seconds later, the internal carotid clamp was removed.  Two repair stitches were required at the distal patch.  Next, Doppler  was used to evaluate signal in the common external and internal carotid  arteries.  All had good signals.  At this point in time, hemostasis was  satisfactory.  A piece of Surgicel was placed over the patch repair  followed by two Ray-Tec.  Incision was partially reapproximated with two  horizontal mattress silk sutures.  Dr. Laneta Simmers then came in and performed  his portion of the procedure.  Dr. Laneta Simmers closed the carotid incision  with the drain.  The patient was taken to the Surgical Intensive Care  Unit intubated in stable condition.      [name], MD  Electronically Signed     VWB/MEDQ  D:  10/11/2008  T:  10/12/2008  Job:  [id number]

## 2010-11-12 NOTE — H&P (Signed)
NAMEKENNETT, SYMES NO.:  [id number]   MEDICAL RECORD NO.:  [id number]          PATIENT TYPE:  INP   LOCATION:  4740                         FACILITY:  [facility]   PHYSICIAN:  Hillis Range, MD       DATE OF BIRTH:  03/09/1961   DATE OF ADMISSION:  10/05/2008  DATE OF DISCHARGE:                              HISTORY & PHYSICAL   CHIEF COMPLAINT:  Chest pain.   HISTORY OF PRESENT ILLNESS:  Mr. Rinck is a 50 year old gentleman with  a history of hypertension and polysubstance abuse, who now presents with  acute onset of left-sided chest pain.  The patient reports being in his  usual state of health upon waking this morning.  He was putting his  clothes on and then over to tie up his boot.  He stood up and abruptly  had left-sided chest pain.  He describes his pain is severe (10/10) with  radiation to his back.  He dropped to the floor, but denies loss of  consciousness.  He reports ongoing pain since that time.  His left-sided  chest pain has been worse with cough and deep inspiration.  It is also  exacerbated by movement of his chest wall and palpation over the left  pectoralis muscle.  He denies shortness of breath, cough, nausea,  fevers, chills, presyncope, or syncope.  He denies any recent episodes  of similar pain in the past.  The patient has a history of noncompliance  and polysubstance abuse.  He drinks more than a six-pack a day, smokes 1  pack of cigarettes per day, smokes marijuana frequently, and is also  looking positive today.  He is unaware as to how cocaine may have  entered his system.  He is presently otherwise without complaint.   PAST MEDICAL HISTORY:  1. Hypertension.  2. Polysubstance abuse.  3. Intermittent chronic back pain.   PAST SURGICAL HISTORY:  None.   ALLERGIES:  None.   MEDICATIONS:  None.   SOCIAL HISTORY:  The patient lives in [place] and works in  [employer].  He has a 30 pack-year history of tobacco, which is  ongoing.  He drinks more than a 6-pack per day.  He frequently uses  marijuana.  He is unaware as to how cocaine may have entered his system.  He reports that he cannot afford blood pressure medications and  therefore does not take medications regularly for his hypertension.   FAMILY HISTORY:  Hypertension and diabetes.  The patient's sister had  multiple pulmonary emboli previously.   REVIEW OF SYSTEMS:  All systems are reviewed and negative except as  outlined in the HPI above.   PHYSICAL EXAMINATION:  VITALS: Blood pressure 140/87, heart rate 67,  respirations 18, sats 98% on room air, afebrile.  GENERAL:  The patient is a thin gentleman in no acute distress.  He is  alert and oriented x3.  He is mildly disheveled.  HEENT:  Normocephalic, atraumatic.  Sclerae clear.  Conjunctivae pink.  Oropharynx clear.  NECK:  Supple.  No thyromegaly, JVD, or bruits.  LUNGS:  Clear to  auscultation bilaterally.  HEART:  Regular rate and rhythm.  No murmurs, rubs, or gallops.  GI:  Soft, nontender, and nondistended.  Positive bowel sounds.  EXTREMITIES:  No clubbing, cyanosis, or edema.  No Homan or cords.  SKIN:  No ecchymoses or lacerations.  MUSCULOSKELETAL:  The patient has moderate tenderness to palpation over  the left pectoralis muscle and movement of the left pectoralis muscle  reproduces his chest pain.  PSYCHIATRIC:  Euthymic mood.  Full affect.  NEUROLOGIC:  Cranial nerves II through XII are intact.  Strength and  sensation are intact.   EKG today reveals sinus rhythm at 77 beats per minute and is otherwise  normal.   LABORATORY DATA:  Drug screen reveals positive for cocaine and opiates.  Urinalysis, otherwise unremarkable.  CK-MB 1.1, troponin less than 0.05,  and creatinine 1.2.  White blood cell count 7.5, hematocrit 46, and  platelets 183.   Chest x-ray, I personally reviewed the patient's chest x-ray, which  appears to me to be normal.   IMPRESSION:  Mr. Toso is a  50 year old gentleman with a history of  hypertension, polysubstance abuse, and noncompliance.  He now presents  with atypical chest pain.  I suspect that his chest pain may be  musculoskeletal in origin.  However, does have a pleuritic component and  the patient has a sister, who has had prior multiple pulmonary emboli.  I think that it is therefore prudent to proceed with a chest CT scan to  rule out pulmonary embolus.  This may also be helpful in evaluating for  any aortic diseases, which I think would be unlikely at this time.  I  think that acute coronary syndrome is also unlikely.  However, we will  admit the patient to telemetry and rule out myocardial infarction with  serial cardiac markers.  We will obtain a fasting lipid profile and  initiate the patient on simvastatin 20 mg daily at this time as well as  aspirin.  I think that his blood pressure will require an  antihypertensive.  I will initiate the patient on lisinopril, which is  on the [employer] $4 plan and once he quit smoking, consuming large  amounts of alcohol, and marijuana, hopefully he will be able to afford  this medication.  Further evaluation and management of the patient's  chest pain will be decided pending the results of the above.      Hillis Range, MD  Electronically Signed     JA/MEDQ  D:  10/05/2008  T:  10/06/2008  Job:  [id number]

## 2010-11-12 NOTE — Discharge Summary (Signed)
NAMEJACION, Randy [name] NO.:  [id number]   MEDICAL RECORD NO.:  [id number]          PATIENT TYPE:  INP   LOCATION:  2018                         FACILITY:  [facility]   PHYSICIAN:  Evelene Croon, M.D.     DATE OF BIRTH:  [birth year]   DATE OF ADMISSION:  10/05/2008  DATE OF DISCHARGE:  10/15/2008                               DISCHARGE SUMMARY   HISTORY:  The patient is a 50 year old gentleman who was admitted with  chest pain.  He has a history of cigarette smoking as well as  polysubstance abuse.  Positive for marijuana, ethanol, and cocaine on  his urine drug screen.  He reported having a several month history of  intermittent left-sided chest pain, which he would describe as sharp.  It was typically not associated with same types of activity and that had  occurred with both exertion and at rest.  He presented with acute  episode of sharp left-sided chest pain that occurred after standing or  putting on his boots.  He described the pain was worse with deep  breathing and was also worse with twisting motion or leaning over.  He  was felt to require admission for further evaluation and treatment.  His  initial electrocardiogram showed no acute changes.  His initial cardiac  enzymes were negative.  He was admitted for further medical  stabilization and study to include cardiac catheterization.   ALLERGIES:  No known drug allergies.   MEDICATIONS PRIOR TO ADMISSION:  None.   PAST MEDICAL HISTORY:  1. Hypertension.  2. 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   PAST SURGICAL HISTORY:  None.   PREVIOUS HOSPITALIZATIONS:  None.   PRIMARY CARE PHYSICIAN:  None.   REVIEW OF SYMPTOMS:  Please see the history and physical.   SOCIAL HISTORY:  He works in [employer] in [employer].  He smokes 1  pack of cigarettes per day for 30 years.  He drinks 6 packs of beer  daily.  He smokes marijuana frequently, but denies cocaine use, but as  noted did have a  positive drug screen.   FAMILY HISTORY:  Positive for hypertension and diabetes.   PHYSICAL EXAMINATION:  Please see the history and physical done at the  time of admission.   HOSPITAL COURSE:  The patient was admitted and additional EKG did reveal  some inferior T-wave changes that was new from the initial EKG.  He was  started on aspirin, statin, and ACE inhibitor.  He had a CT angiogram to  rule out pulmonary embolism.  A 2-D echocardiogram was also performed.  Cardiac catheterization was done on October 06, 2008, by Dr. Riley Kill.  This  revealed severe coronary artery disease to include diffuse LAD disease  with a proximal 80% stenosis, a small first diagonal branch with a 90%  proximal stenosis.  LAD also had a mid 80% distal stenosis.  The left  circumflex had a 60% proximal stenosis, it gave off a large marginal  branch that had a 50% stenosis.  The AV groove portion of the left  circumflex had  a 50% stenosis.  The right coronary artery was diffusely  diseased with an 80 and a 90% proximal and mid vessel stenosis.  There  was 70% stenosis in the posterior descending branch.  An ejection  fraction was 45% with evidence of global hypokinesis.  Disease finding  surgical consultation was obtained with Evelene Croon, M.D. to evaluate  the patient for surgical revascularization.  Dr. Laneta Simmers reviewed the  patient and studies and did agree with the recommendations.  A  preoperative carotid ultrasound was done, which revealed an 80% right  carotid stenosis with diastolic velocities of greater than 200.  He was  found to have a 60-80% left carotid stenosis.  Due to these findings,  the Vascular Surgery consultation was obtained with Dr. Myra Gianotti.  It was  Dr. Estanislado Spire recommendation upon evaluating his studies and the patient  that he should undergo a concurrent right carotid endarterectomy at the  time of coronary surgery so as to lower his risk of cerebrovascular  accident.   PROCEDURE:   The patient was felt to be medically stable to proceed and  on October 11, 2008, he underwent the following procedure, coronary artery  bypass grafting x4.  The following grafts were placed.  1. Left internal mammary artery to the LAD.  2. Saphenous vein graft to the diagonal.  3. Saphenous vein graft to the obtuse marginal.  4. Saphenous vein graft to the posterior descending.  Additionally, he      underwent a right carotid endarterectomy with bovine patch      angioplasty.  The coronary surgery was done by Dr. Myra Gianotti.  The      patient tolerated the procedure well, was taken to the Surgical      Intensive Care Unit in stable condition.   POSTOPERATIVE HOSPITAL COURSE:  The patient has progressed nicely  overall.  He has maintained stable hemodynamics.  He was neurologically  intact with no new findings.  All routine lines, monitors, drains,  devices have been discontinued in the standard fashion.  The patient  does have a moderate postoperative acute blood loss anemia, which was  stabilized.  His most recent hemoglobin and the hematocrit dated October 13, 2008, are 9.5 and 26.8 respectively.  Electrolytes, BUN and  creatinine are within normal limits.  He has required a moderate  diuresis for postoperative volume overload.  He has been started on a  statin for hyperlipidemia.  He has been started on low-dose ACE  inhibitor.  He has been weaned from oxygen and maintains good saturation  on room air.  He is currently maintaining a normal sinus rhythm, but has  had some ventricular ectopy.  His beta-blocker has been adjusted for  this.  He has also required some potassium supplementation.  He is  tolerating routine cardiac rehabilitation modality using standard  protocols.  Incisions are healing well without evidence of infection.  His overall status was felt to be tentatively stable for discharge on  today, date October 15, 2008, pending further evaluation by Cardiology to  see if he  requires any additional medication adjustment prior to going  home.   MEDICATIONS AT THE TIME OF THIS DICTATION:  1. Lopressor 25 mg twice daily.  2. Lasix 40 mg daily for 5 days.  3. Potassium chloride 20 mEq daily for 5 days.  4. Lisinopril 2.5 mg daily.  5. Zocor 40 mg daily.  6. Ultram 50 mg 1 every 4-6 hours p.r.n. as needed for pain.   INSTRUCTIONS:  The patient will receive written instructions regarding  medications, activity, diet, wound care, and followup.   FOLLOWUP:  Will include Dr. Daleen Squibb 2 weeks post discharge; Dr. Laneta Simmers on  Nov 01, 2008; and Dr. Myra Gianotti 2 weeks post discharge.  He has been seen  by social work.  He has also been seen by smoking cessation.  He  understands he requires aggressive lifestyle changes and appears to have  a good comprehension of the need for these changes.   FINAL DIAGNOSES:  1. Severe coronary artery disease, now status post surgical      revascularization as described above.  Other diagnoses extracranial      cerebrovascular occlusive disease, now status post right carotid      endarterectomy.  He will follow up with Dr. Myra Gianotti for a long-term      ultrasound monitoring.  2. Polysubstance abuse.  3. Hypertension.  4. Hyperlipidemia.  5. Acute blood loss anemia.  6. 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      Rowe Clack, P.A.-C.      Evelene Croon, M.D.  Electronically Signed    WEG/MEDQ  D:  10/15/2008  T:  10/16/2008  Job:  [id number]   cc:   [name], MD  Jesse Sans Wall, MD, [facility]

## 2010-11-12 NOTE — Cardiovascular Report (Signed)
NAME:  Randy [name], Randy [name] NO.:  [id number]   MEDICAL RECORD NO.:  [id number]           PATIENT TYPE:   LOCATION:                                 FACILITY:   PHYSICIAN:  Arturo Morton. Riley Kill, MD, FACCDATE OF BIRTH:  Apr 25, 1961   DATE OF PROCEDURE:  10/06/2008  DATE OF DISCHARGE:                            CARDIAC CATHETERIZATION   INDICATIONS:  Randy [name] is a 50 year old gentleman who presents with  chest pain.  He has had some recent cocaine exposure.  He says this  occurred last week unknowingly.  The current study was done to assess  coronary anatomy.   PROCEDURE:  1. Left heart catheterization.  2. Selective coronary territory.  3. Selective left ventriculography.  4. Intracoronary nitroglycerin administration.   DESCRIPTION OF THE PROCEDURE:  The patient was brought to cath lab,  prepped and draped in the usual fashion.  Through an anterior puncture,  the right femoral artery was easily entered and a 5-French sheath  placed.  Views of the left and right coronary arteries were obtained.  Left coronary artery arteriography was performed before and after  intracoronary nitroglycerin.  A Williams catheter was used to engage the  right coronary.  Central aortic and left ventricular pressures were  measured with a pigtail.  Ventriculography was performed in the RAO  projection.  Following a pressure pullback, I explained the need for  bypass surgery to the patient and also spoke with his family.  Surgical  consult was obtained.   He was taken to the holding area for sheath removal.  There were no  complications.   HEMODYNAMIC DATA:  1. Central aortic pressure was 143/97, mean 117.  2. LV pressure 120/7.  3. There was no gradient or pullback across the aortic valve.   ANGIOGRAPHIC DATA:  1. The left main coronary artery demonstrates some calcification and      abnormalities.  It seems opened up after intracoronary      nitroglycerin administration without  critical obstruction, but in      some views it does look as if there is plaque.  2. The left anterior descending artery is calcified, and is a severely      diseased vessel.  There are multiple areas of plaque up and down.      The whole segment proximally is narrow with about an 80% focal      stenosis before the diagonal and a 90% ostial diagonal.  There is      severe disease in the mid vessel as well.  The segment of the mid      vessel opens up and then the distal portion opens up as well after      intracoronary nitroglycerin with residual focal 80% narrowing      between these areas.  The LAD does look graftable, but is not ideal      distally for this.  3. The circumflex is a large-caliber vessel.  It also is somewhat      diseased.  There is 60% segmental plaquing before the AV  circumflex.  After intracoronary nitroglycerin, the AV circumflex      demonstrates about 50% area of proximal narrowing.  The mid      circumflex has 50-60% diffuse plaque and then diffuse luminal      irregularities distally, although the circumflex itself appears to      be a graftable vessel.  4. The right coronary artery is severely diseased from the proximal      mid junction all the way down to the distal vessel.  There are      multiple lesions including 80, 90, and 70% with diffuse disease      interspersed between these areas, probably is 50% involving the      posterolateral branch, and the distal vasculature, while graftable      does not appear to be normal as well.  5. The ventriculogram demonstrates what appears to be moderate global      hypokinesis with an ejection fraction of 45%.  There appears to be      global hypokinesis.  There is not definite mitral regurgitation.   CONCLUSION:  1. Severe three-vessel coronary artery disease.  2. Ischemic left ventricular dysfunction.   PLAN:  At the present time, we will get a surgical consult.  The patient  has severe coronary artery  disease.  The LAD is severely involved as is  the RCA diffusely and is not appropriate for percutaneous intervention.      Arturo Morton. Riley Kill, MD, [facility]  Electronically Signed     TDS/MEDQ  D:  10/06/2008  T:  10/07/2008  Job:  [id number]   cc:   Thomas C. Wall, MD, [facility]  CV Laboratory.

## 2010-11-12 NOTE — Consult Note (Signed)
NAME:  Randy [name], Randy [name]                ACCOUNT NO.:  [id number]   MEDICAL RECORD NO.:  [id number]          PATIENT TYPE:  INP   LOCATION:  2310                         FACILITY:  [facility]   PHYSICIAN:  Juleen China IV, MDDATE OF BIRTH:  Sep 18, 1960   DATE OF CONSULTATION:  10/10/2008  DATE OF DISCHARGE:                                 CONSULTATION   REASON FOR CONSULTATION:  Carotid disease.   HISTORY:  This is a 50 year old gentleman who was admitted with chest  pain.  He has undergone cardiac catheterization and is scheduled for  coronary artery bypass graft tomorrow.  His pre CABG workup revealed  that he has high-grade greater than 80% stenosis in his right carotid  artery and moderate 60-80% stenosis in his left carotid artery.  No flow  was noted in the left vertebral artery.  When asked about the specific  stroke-like symptoms, the patient reports chronic pain and numbness in  his left leg, however this has been attributed to his back and has been  going on for several years.  He does describe however, an episode which  has happened more than once where he has had difficulty getting his  thoughts out and his sister has described him as having some slurred  speech.  He even states that he wondered if at that time he was having a  mild stroke.  His most recent episode was several months ago.   The patient has a history of polysubstance abuse including marijuana,  alcohol and cocaine.  He also has a significant smoking history.  He  also suffers from hypertension.   REVIEW OF SYSTEMS:  Positive for chest pain and pain with deep  inspiration.  He does describe left leg pain as well as claudication  type symptoms.  He denies GI or GU complaints.  He denies hematology  complaints and psychiatric complaint other than substance abuse.   PAST MEDICAL HISTORY:  Positive for:  1. Hypertension.  2. Polysubstance abuse.  3. Chronic back pain.   MEDICATIONS:  None.   ALLERGIES:   None.   SOCIAL HISTORY:  Positive for 30-pack year cigarette history, six packs  of alcohol a day.  Occasional marijuana and cocaine.   FAMILY HISTORY:  Positive for hypertension and diabetes.   PHYSICAL EXAMINATION:  VITALS:  He is afebrile, hemodynamically stable.  GENERAL:  He is well appearing in no acute distress.  HEENT:  Normocephalic, atraumatic.  Pupils are equal.  NECK:  Supple.  No carotid bruits.  CARDIOVASCULAR:  Regular rate and rhythm.  PULMONARY:  Lungs are clear bilaterally.  ABDOMEN:  Soft, nontender.  EXTREMITIES:  Warm and well-perfused.  SKIN:  Without rash.  NEUROLOGIC:  Cranial nerves II-XII are grossly intact.  He has no focal  deficits.   DIAGNOSTIC STUDIES:  Carotid ultrasound was performed which reveals  greater than 80% right carotid stenosis with diastolic velocities  greater than 200.  He has 60-80% left carotid stenosis, right vertebral  flow was antegrade, the left vertebral artery is occluded.  Lower  extremity Dopplers:  The patient has a monophasic  signal in the left and  biphasic in the right.   ASSESSMENT AND PLAN:  Possible symptomatic right carotid stenosis.   PLAN:  I had a lengthy conversation with the patient as well as his  family.  After specific questioning, I felt that the episodes he had  with regards to difficulty word finding and slurred speech are likely  representative of a TIA.  Based on his carotid duplex which shows  moderate left-sided stenosis and high-grade right carotid stenosis with  significantly elevated diastolic velocities on the right, I feel that it  is safest to proceed with combined right carotid endarterectomy in  conjunction with his coronary artery bypass graft.  I spent an extensive  amount of time discussing this with the patient.  We discussed the risks  of the procedure including the risk of stroke and nerve injury.  The  patient and his family are all in agreement to proceed with combined  CABG  carotid.      [name], MD  Electronically Signed     VWB/MEDQ  D:  10/10/2008  T:  10/11/2008  Job:  [id number]

## 2010-11-12 NOTE — Assessment & Plan Note (Signed)
OFFICE VISIT   Randy, [name]  DOB:  Nov 19, 1960                                       10/30/2008  CHART#:[id number]   REASON FOR VISIT:  Followup.   HISTORY:  This is a 50 year old who had questionable TIAs.  He underwent  coronary artery bypass graft by Dr. Laneta Simmers.  At that time he underwent  concomitant right carotid endarterectomy with patch angioplasty.  He had  an uneventful recovery.  He is here for followup.  He is doing very  well.  He has no neurologic deficits.  He has stopped smoking.  His  incision looks okay, and he is going to see me back in 6 months.  We  will need to evaluate his left side, as it was 60-80% stenosed at his  original duplex.   [name], MD  Electronically Signed   VWB/MEDQ  D:  10/30/2008  T:  10/31/2008  Job:  [id number]

## 2010-11-12 NOTE — Assessment & Plan Note (Signed)
OFFICE VISIT   MAXWEL, MEADOWCROFT  DOB:  Feb 24, 1961                                        October 20, 2008  CHART #:  [id number]   The patient called today to report some pain in the right calf, which he  described as a burning pain which seems to improve with ambulation.  He  has also reported some swelling in his right lower leg, ankle, and foot.  When he was in the hospital he had no complaints of this.  We asked him  to go to the [facility] to have a lower  extremity Doppler exam to rule out DVT.  He is going to have his niece  drive him there this afternoon and get the study done which would be  called to our office.   Evelene Croon, M.D.  Electronically Signed   BB/MEDQ  D:  10/20/2008  T:  10/21/2008  Job:  [id number]

## 2011-03-18 ENCOUNTER — Observation Stay (HOSPITAL_COMMUNITY)
Admission: EM | Admit: 2011-03-18 | Discharge: 2011-03-20 | Disposition: A | Discharge status: Home / Self Care | Payer: Self-pay | Source: Ambulatory Visit | Attending: Internal Medicine | Admitting: Internal Medicine

## 2011-03-18 ENCOUNTER — Emergency Department (HOSPITAL_COMMUNITY): Payer: Self-pay

## 2011-03-18 ENCOUNTER — Encounter (HOSPITAL_COMMUNITY): Payer: Self-pay | Admitting: Radiology

## 2011-03-18 DIAGNOSIS — Z9119 Patient's noncompliance with other medical treatment and regimen: Secondary | ICD-10-CM | POA: Insufficient documentation

## 2011-03-18 DIAGNOSIS — R0789 Other chest pain: Principal | ICD-10-CM | POA: Insufficient documentation

## 2011-03-18 DIAGNOSIS — I2510 Atherosclerotic heart disease of native coronary artery without angina pectoris: Secondary | ICD-10-CM | POA: Insufficient documentation

## 2011-03-18 DIAGNOSIS — Z91199 Patient's noncompliance with other medical treatment and regimen due to unspecified reason: Secondary | ICD-10-CM | POA: Insufficient documentation

## 2011-03-18 DIAGNOSIS — I10 Essential (primary) hypertension: Secondary | ICD-10-CM | POA: Insufficient documentation

## 2011-03-18 DIAGNOSIS — I739 Peripheral vascular disease, unspecified: Secondary | ICD-10-CM | POA: Insufficient documentation

## 2011-03-18 DIAGNOSIS — R0602 Shortness of breath: Secondary | ICD-10-CM | POA: Insufficient documentation

## 2011-03-18 DIAGNOSIS — R911 Solitary pulmonary nodule: Secondary | ICD-10-CM | POA: Insufficient documentation

## 2011-03-18 DIAGNOSIS — Z951 Presence of aortocoronary bypass graft: Secondary | ICD-10-CM | POA: Insufficient documentation

## 2011-03-18 HISTORY — DX: Atherosclerotic heart disease of native coronary artery without angina pectoris: I25.10

## 2011-03-18 HISTORY — DX: Presence of aortocoronary bypass graft: Z95.1

## 2011-03-18 HISTORY — DX: Essential (primary) hypertension: I10

## 2011-03-18 LAB — COMPREHENSIVE METABOLIC PANEL
AST: 25 U/L (ref 0–37)
Albumin: 3.9 g/dL (ref 3.5–5.2)
Alkaline Phosphatase: 81 U/L (ref 39–117)
CO2: 22 mEq/L (ref 19–32)
Chloride: 100 mEq/L (ref 96–112)
Potassium: 4.2 mEq/L (ref 3.5–5.1)
Total Bilirubin: 0.3 mg/dL (ref 0.3–1.2)

## 2011-03-18 LAB — DIFFERENTIAL
Basophils Absolute: 0.1 10*3/uL (ref 0.0–0.1)
Lymphocytes Relative: 39 % (ref 12–46)
Lymphs Abs: 2.9 10*3/uL (ref 0.7–4.0)
Monocytes Absolute: 0.5 10*3/uL (ref 0.1–1.0)
Neutro Abs: 3.8 10*3/uL (ref 1.7–7.7)

## 2011-03-18 LAB — CBC
HCT: 52.4 % — ABNORMAL HIGH (ref 39.0–52.0)
Hemoglobin: 18.5 g/dL — ABNORMAL HIGH (ref 13.0–17.0)
MCHC: 35.3 g/dL (ref 30.0–36.0)
MCV: 86.8 fL (ref 78.0–100.0)

## 2011-03-18 LAB — POCT I-STAT TROPONIN I

## 2011-03-18 LAB — D-DIMER, QUANTITATIVE: D-Dimer, Quant: 0.37 ug/mL-FEU (ref 0.00–0.48)

## 2011-03-18 LAB — TROPONIN I: Troponin I: <0.3 ng/mL (ref <0.30)

## 2011-03-18 MED ORDER — IOHEXOL 300 MG/ML  SOLN
100.0000 mL | Freq: Once | INTRAMUSCULAR | Status: AC | PRN
  Administered 2011-03-18: 100 mL via INTRAVENOUS

## 2011-03-19 ENCOUNTER — Observation Stay (HOSPITAL_COMMUNITY): Payer: Self-pay

## 2011-03-19 DIAGNOSIS — R072 Precordial pain: Secondary | ICD-10-CM

## 2011-03-19 DIAGNOSIS — R911 Solitary pulmonary nodule: Secondary | ICD-10-CM

## 2011-03-19 DIAGNOSIS — F17200 Nicotine dependence, unspecified, uncomplicated: Secondary | ICD-10-CM

## 2011-03-19 DIAGNOSIS — J449 Chronic obstructive pulmonary disease, unspecified: Secondary | ICD-10-CM

## 2011-03-19 DIAGNOSIS — R0902 Hypoxemia: Secondary | ICD-10-CM

## 2011-03-19 LAB — DIFFERENTIAL
Basophils Absolute: 0.1 10*3/uL (ref 0.0–0.1)
Basophils Relative: 1 % (ref 0–1)
Eosinophils Absolute: 0.3 10*3/uL (ref 0.0–0.7)
Eosinophils Relative: 4 % (ref 0–5)
Lymphocytes Relative: 50 % — ABNORMAL HIGH (ref 12–46)
Monocytes Absolute: 0.5 10*3/uL (ref 0.1–1.0)

## 2011-03-19 LAB — CK TOTAL AND CKMB (NOT AT ARMC)
CK, MB: 2.1 ng/mL (ref 0.3–4.0)
CK, MB: 2.3 ng/mL (ref 0.3–4.0)
CK, MB: 2.5 ng/mL (ref 0.3–4.0)
Relative Index: INVALID (ref 0.0–2.5)
Relative Index: INVALID (ref 0.0–2.5)
Total CK: 36 U/L (ref 7–232)
Total CK: 38 U/L (ref 7–232)
Total CK: 42 U/L (ref 7–232)

## 2011-03-19 LAB — BASIC METABOLIC PANEL
BUN: 11 mg/dL (ref 6–23)
Creatinine, Ser: 1.25 mg/dL (ref 0.50–1.35)
GFR calc Af Amer: >60 mL/min (ref >60)
GFR calc non Af Amer: >60 mL/min (ref >60)
Glucose, Bld: 88 mg/dL (ref 70–99)
Potassium: 4 mEq/L (ref 3.5–5.1)

## 2011-03-19 LAB — CBC
HCT: 47.3 % (ref 39.0–52.0)
MCHC: 33.8 g/dL (ref 30.0–36.0)
Platelets: 197 10*3/uL (ref 150–400)
RDW: 14 % (ref 11.5–15.5)
WBC: 6.8 10*3/uL (ref 4.0–10.5)

## 2011-03-19 LAB — TROPONIN I: Troponin I: <0.3 ng/mL (ref <0.30)

## 2011-03-19 LAB — LIPID PANEL
HDL: 32 mg/dL — ABNORMAL LOW (ref >39)
LDL Cholesterol: 208 mg/dL — ABNORMAL HIGH (ref 0–99)

## 2011-03-19 MED ORDER — TECHNETIUM TC 99M TETROFOSMIN IV KIT
10.0000 | PACK | Freq: Once | INTRAVENOUS | Status: AC | PRN
  Administered 2011-03-19: 10 via INTRAVENOUS

## 2011-03-19 MED ORDER — TECHNETIUM TC 99M TETROFOSMIN IV KIT
30.0000 | PACK | Freq: Once | INTRAVENOUS | Status: AC | PRN
  Administered 2011-03-19: 30 via INTRAVENOUS

## 2011-03-20 LAB — BASIC METABOLIC PANEL
BUN: 15 mg/dL (ref 6–23)
CO2: 27 mEq/L (ref 19–32)
Calcium: 9.1 mg/dL (ref 8.4–10.5)
Creatinine, Ser: 0.88 mg/dL (ref 0.50–1.35)
Glucose, Bld: 97 mg/dL (ref 70–99)

## 2011-03-20 LAB — URINE DRUGS OF ABUSE SCREEN W ALC, ROUTINE (REF LAB)
Barbiturate Quant, Ur: NEGATIVE
Benzodiazepines.: POSITIVE — AB
Marijuana Metabolite: POSITIVE — AB
Opiate Screen, Urine: POSITIVE — AB
Phencyclidine (PCP): NEGATIVE
Propoxyphene: NEGATIVE

## 2011-03-20 LAB — CBC
Hemoglobin: 18.4 g/dL — ABNORMAL HIGH (ref 13.0–17.0)
MCH: 31.5 pg (ref 26.0–34.0)
MCHC: 35.8 g/dL (ref 30.0–36.0)
MCV: 87.9 fL (ref 78.0–100.0)
RBC: 5.85 MIL/uL — ABNORMAL HIGH (ref 4.22–5.81)

## 2011-03-23 LAB — OPIATE, QUANTITATIVE, URINE
Hydrocodone: NEGATIVE NG/ML
Oxycodone, ur: 4018 NG/ML — ABNORMAL HIGH
Oxymorphone: 847 NG/ML — ABNORMAL HIGH

## 2011-03-23 LAB — THC (MARIJUANA), URINE, CONFIRMATION: Marijuana, Ur-Confirmation: 200 NG/ML — ABNORMAL HIGH

## 2011-03-23 LAB — BENZODIAZEPINE, QUANTITATIVE, URINE
Lorazepam UR QT: NEGATIVE NG/ML
Oxazepam GC/MS Conf: NEGATIVE NG/ML

## 2011-03-24 NOTE — Discharge Summary (Signed)
Randy [name], HAGOOD NO.:  [id number]  MEDICAL RECORD NO.:  [id number]  LOCATION:  5504                         FACILITY:  [facility]  PHYSICIAN:  Marinda Elk, M.D.DATE OF BIRTH:  [birth year]  DATE OF ADMISSION:  03/18/2011 DATE OF DISCHARGE:                              DISCHARGE SUMMARY   PRIMARY CARE DOCTOR:  None.  CARDIOLOGIST:  Dr. Riley Kill.  DISCHARGE DIAGNOSES: 1. Atypical chest pain with a negative nuclear stress test. 2. Left lower lobe pulmonary nodule measuring 8 x 7 which has     increased in size to 10.9.  He will followup with Pulmonary as an     outpatient. 3. History of coronary artery disease. 4. Peripheral vascular disease. 5. Uncontrolled hypertension.  DISCHARGE MEDICATIONS: 1. Coreg 6.25 mg p.o. b.i.d. 2. Opana 100 mg t.i.d. 3. Vicodin 1-2 tablets q.4 h. p.r.n. 4. Prilosec 40 mg daily. 5. Simvastatin 40 mg daily. 6. Aspirin 81 mg daily.  PROCEDURES PERFORMED: 1. Nuclear stress test that showed mild LV with no ischemic changes.     His EF was 45%.  CT angio of the chest that showed no evidence of     aortic dissection or pulmonary embolism, small hiatal hernia.     Pulmonary nodule seen compared to prior study. 2. Chest x-ray showed no evidence of acute cardiopulmonary disease.  BRIEF ADMITTING H AND P:  This is a 50 year old male with past medical history of CABG, history of peripheral vascular disease, ongoing smoking that comes in polysubstance abuse with history of noncompliance, comes in with the above-mentioned complaint.  The patient is started having left chest pain, located on the left side below.  He claims the pain is worse with breathing.  Last year, apparently, he was supposed to have an outpatient stress test which he could not.  Pain is reproducible by light palpation.  But it is progressively gotten worse, so we are asked to admit him for further evaluate.  Please refer to dictation of March 18, 2011,  for further details.  ASSESSMENT/PLAN: 1. Atypical chest pain.  He was admitted to the telemetry floor with     no events.  Cardiac enzymes were cycled which were negative.  A     nuclear stress test was ordered that showed negative results.  So,     he will be followed up by his Cardiology as an outpatient.  He has     been noncompliant with his medication.  We will started him on     Coreg and a statin and we have given him his prescription.  He is     to follow up as an outpatient. 2. Left lower lobe pulmonary nodule.  Pulmonary was consulted.  They     recommended to follow up as an outpatient with a chest x-ray.  They     will call him with an appointment. 3. Peripheral vascular disease, on aspirin and statins.  Continue     current treatment. 4. Uncontrolled hypertension.  The patient has not been taking his     blood pressure medication.  He was started on Coreg and his blood     pressure slowly coming  down with a Coreg 6.125.  I will not add an     ACE at this time.  I do not how he is going to respond to the     Coreg.  He will follow up with primary care doctor as an     outpatient.     Marinda Elk, M.D.     AF/MEDQ  D:  03/20/2011  T:  03/20/2011  Job:  [id number]  cc:   Pulmonary Critical Care office  Electronically Signed by Marinda Elk M.D. on 03/24/2011 01:32:59 PM

## 2011-04-08 NOTE — H&P (Signed)
NAMEDERICO, MITTON NO.:  [id number]  MEDICAL RECORD NO.:  [id number]  LOCATION:  [facility]                         FACILITY:  [facility]  PHYSICIAN:  Jeoffrey Massed, MD    DATE OF BIRTH:  [birth year]  DATE OF ADMISSION:  03/18/2011 DATE OF DISCHARGE:                             HISTORY & PHYSICAL   PRIMARY CARDIOLOGIST:  Arturo Morton. Riley Kill, MD, [facility]  CHIEF COMPLAINT:  Chest pain, left-sided for the past 2 days.  HISTORY OF PRESENT ILLNESS:  The patient is a 50 year old white male with a history of coronary artery disease status post CABG, history of peripheral vascular disease status post carotid endarterectomy, history of hypertension, history of polysubstance abuse, history of noncompliance to medications comes in with the above-noted complaints. Per the patient, he has been having this left-sided chest pain mostly located on the left lateral part of his chest below his left nipple area for the past 2 days.  He claims that the pain is worse with breathing. He claims he has some mild shortness of breath with it.  Last year apparently he was supposed to have an outpatient stress test, which he does not have.  Pain is exquisitely reproducible even by with light touch and deep touch as well.  Because of significant cardiac risk factors, the patient is being admitted to the Hospitalist Service for further evaluation and treatment.  ALLERGIES:  The patient does not have any known drug allergies.  PAST MEDICAL HISTORY: 1. Coronary artery disease status post CABG. 2. History of carotid artery disease status post carotid     endarterectomy. 3. Noncompliance. 4. Hypertension. 5. Polysubstance abuse.  PAST SURGICAL HISTORY:  As above.  MEDICATIONS:  Noncompliant to medications.  SOCIAL HISTORY:  Lives in [place].  Works in [employer].  He has a 30- to 40-year pack history.  He apparently drinks of 3-4 bottles of beer daily.  FAMILY HISTORY:  Hypertension and  diabetes.  His sister had pulmonary emboli in the past.  REVIEW OF SYSTEMS:  A detailed review of 12 systems was done and these are negative except for the ones noted in the HPI.  PHYSICAL EXAM:  GENERAL:  The patient is lying in bed and holding his left side of chest in some mild-to-moderate discomfort, but awake and alert with speech clear. VITAL SIGNS:  His last set of vital signs shows a temperature to be afebrile, heart rate of 75, blood pressure 141/75, and pulse ox of 98% on 2 liters. HEENT:  Atraumatic, normocephalic.  Pupils equally reactive to light and accommodation. NECK:  Supple. CHEST:  Bilaterally clear to auscultation. CARDIOVASCULAR:  Heart sounds are regular.  No murmurs heard. ABDOMEN:  Soft, nontender, nondistended. EXTREMITIES:  No edema. NEUROLOGIC:  The patient is awake and alert and no focal neurological deficits. MUSCULOSKELETAL:  The patient has reproducible pain on the left lower part of his chest below his left nipple.  LABORATORY DATA: 1. CBC shows WBC of 7.5, hemoglobin of 18.5, hematocrit of 52.4, and a     platelet count of 290. 2. First set of cardiac enzymes are negative. 3. D-dimer 0.37. 4. Chemistry shows sodium of 136, potassium 4.2, chloride of 100,  bicarb of 22, glucose of 90, BUN of 6, creatinine of 0.94, and a     calcium of 9.5. 5. Total bilirubin is 0.3, alkaline phosphatase of 81, AST of 25, ALT     of 25, total protein of 7.3, albumin of 3.9.  RADIOLOGICAL STUDIES: 1. X-ray of the chest showed no evidence of acute cardiopulmonary     disease. 2. CT angiogram of the chest showed no evidence of aortic dissection     or pulmonary embolism.  Pulmonary nodule seen in the left lower     lobe on a prior study has increased from 8 x 7 mm to 10 x 9 mm.  ASSESSMENT: 1. Chest pain, persistent and severe, however, with reproducibility,     this is likely to be atypical.  The patient also has had a CT     angiogram of the chest, which  is negative for both pulmonary     embolism and aortic dissection. 2. History of coronary artery disease status post CABG. 3. History of hypertension. 4. Noncompliance to medications.  PLAN: 1. The patient will be admitted to telemetry unit. 2. Cardiac enzymes will be cycled. 3. He will be provided NSAIDs in the form of IV Toradol for pain along     with narcotics. 4. We will attempt to titrate of his Tridil drip as well. 5. Coreg, aspirin, and simvastatin will be started. 6. Further plan will depend as the patient's clinical course evolves. 7. If he is chest pain-free, he will be scheduled for a stress test by     [facility]. 8. DVT prophylaxis with Lovenox. 9. Code status, full code.  Total time spent for admission 45 minutes.     Jeoffrey Massed, MD     SG/MEDQ  D:  03/18/2011  T:  03/18/2011  Job:  [id number]  Electronically Signed by Jeoffrey Massed  on 04/08/2011 03:53:17 PM

## 2011-05-14 ENCOUNTER — Institutional Professional Consult (permissible substitution): Payer: Self-pay | Admitting: Pulmonary Disease

## 2011-05-16 ENCOUNTER — Encounter: Payer: Self-pay | Admitting: Pulmonary Disease

## 2011-05-19 ENCOUNTER — Encounter: Payer: Self-pay | Admitting: Pulmonary Disease

## 2011-05-19 ENCOUNTER — Ambulatory Visit (INDEPENDENT_AMBULATORY_CARE_PROVIDER_SITE_OTHER): Payer: Self-pay | Admitting: Pulmonary Disease

## 2011-05-19 VITALS — BP 156/100 | HR 78 | Temp 98.2°F | Ht 70.0 in | Wt 175.8 lb

## 2011-05-19 DIAGNOSIS — J984 Other disorders of lung: Secondary | ICD-10-CM

## 2011-05-19 DIAGNOSIS — R911 Solitary pulmonary nodule: Secondary | ICD-10-CM | POA: Insufficient documentation

## 2011-05-19 NOTE — Progress Notes (Signed)
  Subjective:    Patient ID: Randy [name], male    DOB: [birth year], 50 y.o.   MRN: [id number]  HPI The patient is a 50 year old male who I have been asked to see for an abnormal CT chest.  He was recently in the hospital where a CT of his chest showed a 1 cm lesion in the left lower lobe, and the nodule in question had very smooth borders.  No other significant abnormalities were noted.  The patient does have a long-standing history of smoking, and continues to do so.  He denies any hemoptysis, weight loss, or anorexia.  He has never lived in [place] or [place], and denies any prior tuberculosis exposure.  He has never had a PPD skin test done.  He did have a CT scan in 2010 which showed the lesion in question to be 7 x 8 mm.   Review of Systems  Constitutional: Negative for fever and unexpected weight change.  HENT: Positive for trouble swallowing and dental problem. Negative for ear pain, nosebleeds, congestion, sore throat, rhinorrhea, sneezing, postnasal drip and sinus pressure.   Eyes: Negative for redness and itching.  Respiratory: Positive for shortness of breath. Negative for cough, chest tightness and wheezing.   Cardiovascular: Positive for chest pain and palpitations. Negative for leg swelling.  Gastrointestinal: Negative for nausea and vomiting.  Genitourinary: Negative for dysuria.  Musculoskeletal: Negative for joint swelling.  Skin: Negative for rash.  Neurological: Negative for headaches.  Hematological: Does not bruise/bleed easily.  Psychiatric/Behavioral: Positive for dysphoric mood. The patient is not nervous/anxious.        Objective:   Physical Exam Constitutional:  Well developed, no acute distress  HENT:  Nares patent without discharge, but narrowed bilat.  Oropharynx without exudate, palate and uvula are normal  Eyes:  Perrla, eomi, no scleral icterus  Neck:  No JVD, no TMG  Cardiovascular:  Normal rate, regular rhythm, no rubs or gallops.  No  murmurs        Intact distal pulses  Pulmonary :  Normal breath sounds, no stridor or respiratory distress   No rales or wheezing, a few rhonchi noted.   Abdominal:  Soft, nondistended, bowel sounds present.  No tenderness noted.   Musculoskeletal:  No lower extremity edema noted.  Lymph Nodes:  No cervical lymphadenopathy noted  Skin:  No cyanosis noted  Neurologic:  Alert, appropriate, moves all 4 extremities without obvious deficit.         Assessment & Plan:

## 2011-05-19 NOTE — Assessment & Plan Note (Signed)
The patient has a 1 cm lesion in his left lower lobe that has grown by a few millimeters since 2010.  He has had significant risk for bronchogenic cancer with his long-standing history of smoking, but the lesion in question is also very round and smooth.  I have outlined various approaches to this including surveillance and biopsy.  The lesion in question is very small and deep inside the lung, and I have recommended surveillance for now.  I will do a PET scan to help guide more aggressive management if needed.  The patient and his family are agreeable to this approach.

## 2011-05-19 NOTE — Patient Instructions (Signed)
Will schedule for PET scan to further evaluate the spot on your lung. Will call with results once available.

## 2011-05-30 ENCOUNTER — Encounter (HOSPITAL_COMMUNITY): Payer: Self-pay

## 2011-05-30 ENCOUNTER — Encounter (HOSPITAL_COMMUNITY)
Admission: RE | Admit: 2011-05-30 | Discharge: 2011-05-30 | Disposition: A | Discharge status: Home / Self Care | Payer: Self-pay | Source: Ambulatory Visit | Attending: Pulmonary Disease | Admitting: Pulmonary Disease

## 2011-05-30 DIAGNOSIS — I714 Abdominal aortic aneurysm, without rupture, unspecified: Secondary | ICD-10-CM | POA: Insufficient documentation

## 2011-05-30 DIAGNOSIS — R911 Solitary pulmonary nodule: Secondary | ICD-10-CM

## 2011-05-30 DIAGNOSIS — J984 Other disorders of lung: Secondary | ICD-10-CM | POA: Insufficient documentation

## 2011-05-30 HISTORY — DX: Solitary pulmonary nodule: R91.1

## 2011-05-30 MED ORDER — FLUDEOXYGLUCOSE F - 18 (FDG) INJECTION
16.9000 | Freq: Once | INTRAVENOUS | Status: AC | PRN
  Administered 2011-05-30: 16.9 via INTRAVENOUS

## 2011-06-03 ENCOUNTER — Other Ambulatory Visit: Payer: Self-pay | Admitting: Pulmonary Disease

## 2011-06-03 DIAGNOSIS — R911 Solitary pulmonary nodule: Secondary | ICD-10-CM

## 2011-12-29 DIAGNOSIS — I714 Abdominal aortic aneurysm, without rupture, unspecified: Secondary | ICD-10-CM

## 2011-12-29 HISTORY — DX: Abdominal aortic aneurysm, without rupture: I71.4

## 2011-12-29 HISTORY — DX: Abdominal aortic aneurysm, without rupture, unspecified: I71.40

## 2012-01-07 ENCOUNTER — Encounter (HOSPITAL_COMMUNITY): Payer: Self-pay | Admitting: *Deleted

## 2012-01-07 ENCOUNTER — Emergency Department (HOSPITAL_COMMUNITY)
Admission: EM | Admit: 2012-01-07 | Discharge: 2012-01-07 | Disposition: A | Discharge status: Home / Self Care | Payer: Self-pay | Attending: Emergency Medicine | Admitting: Emergency Medicine

## 2012-01-07 ENCOUNTER — Emergency Department (HOSPITAL_COMMUNITY): Payer: Self-pay

## 2012-01-07 DIAGNOSIS — I714 Abdominal aortic aneurysm, without rupture, unspecified: Secondary | ICD-10-CM | POA: Insufficient documentation

## 2012-01-07 DIAGNOSIS — I10 Essential (primary) hypertension: Secondary | ICD-10-CM | POA: Insufficient documentation

## 2012-01-07 DIAGNOSIS — M62830 Muscle spasm of back: Secondary | ICD-10-CM

## 2012-01-07 DIAGNOSIS — M5380 Other specified dorsopathies, site unspecified: Secondary | ICD-10-CM | POA: Insufficient documentation

## 2012-01-07 DIAGNOSIS — I25810 Atherosclerosis of coronary artery bypass graft(s) without angina pectoris: Secondary | ICD-10-CM | POA: Insufficient documentation

## 2012-01-07 DIAGNOSIS — I252 Old myocardial infarction: Secondary | ICD-10-CM | POA: Insufficient documentation

## 2012-01-07 DIAGNOSIS — M549 Dorsalgia, unspecified: Secondary | ICD-10-CM | POA: Insufficient documentation

## 2012-01-07 MED ORDER — SODIUM CHLORIDE 0.9 % IV BOLUS (SEPSIS)
1000.0000 mL | Freq: Once | INTRAVENOUS | Status: AC
  Administered 2012-01-07: 1000 mL via INTRAVENOUS

## 2012-01-07 MED ORDER — ONDANSETRON 4 MG PO TBDP
8.0000 mg | ORAL_TABLET | Freq: Once | ORAL | Status: AC
  Administered 2012-01-07: 8 mg via ORAL
  Filled 2012-01-07: qty 2

## 2012-01-07 MED ORDER — DIAZEPAM 5 MG/ML IJ SOLN
10.0000 mg | Freq: Once | INTRAMUSCULAR | Status: AC
  Administered 2012-01-07: 10 mg via INTRAMUSCULAR
  Filled 2012-01-07: qty 2

## 2012-01-07 MED ORDER — HYDROCODONE-ACETAMINOPHEN 5-325 MG PO TABS: 1.0000 | ORAL_TABLET | Freq: Four times a day (QID) | ORAL | Status: AC | PRN

## 2012-01-07 MED ORDER — DIAZEPAM 5 MG PO TABS: 5.0000 mg | ORAL_TABLET | Freq: Four times a day (QID) | ORAL | Status: AC | PRN

## 2012-01-07 MED ORDER — HYDROMORPHONE HCL PF 2 MG/ML IJ SOLN
2.0000 mg | Freq: Once | INTRAMUSCULAR | Status: AC
  Administered 2012-01-07: 2 mg via INTRAMUSCULAR
  Filled 2012-01-07: qty 1

## 2012-01-07 MED ORDER — ONDANSETRON HCL 4 MG/2ML IJ SOLN
4.0000 mg | Freq: Once | INTRAMUSCULAR | Status: AC
  Administered 2012-01-07: 4 mg via INTRAVENOUS
  Filled 2012-01-07: qty 2

## 2012-01-07 NOTE — ED Notes (Addendum)
Pt states he was lifting his back and twisted his back. Pt states "It feels like someone took a sledgehammer up against my back." Pt states he is unable to walk without a lot of pain. Pt states "my pain is all right if I don't move, but if I barely move it hurts real bad."  PT also c/o LLE pain/numbness x couple weeks. States "it hurts just to walk on it 553ft.

## 2012-01-07 NOTE — ED Notes (Signed)
Reports injuring lower back today while working. No acute distress noted at triage.

## 2012-01-07 NOTE — ED Provider Notes (Signed)
History     CSN: [id number]  Arrival date & time 01/07/12  1830   First MD Initiated Contact with Patient 01/07/12 2009      Chief Complaint  Patient presents with  . Back Pain   HPI  History provided by the patient. 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 Patient states that he had a heavy piece of flooring and was trying to push into place and was bending over and stretching. Patient felt some stretching and sharp pains in the back following this they gradually increased and worsened throughout the evening and night. Patient reports taking 2 hydrocodone without any improvement of symptoms. Patient states pain is the point that he can no longer get up or move. Patient states she required significant help in coming to the emergency room. Patient also mentions having some low back soreness and pains for over the past month. Patient denies any other associated symptoms. Denies any urinary or fecal incontinence, urinary retention or perineal numbness. Denies any radiating pain to lower extremities, numbness or tingling in legs or weakness.      Past Medical History  Diagnosis Date  . Hypertension   . CAD (coronary artery disease)   . Hx of CABG 2010  . PVD (peripheral vascular disease)   . Polysubstance abuse   . Abnormal heart rhythm   . Heart attack 2010  . Hyperlipidemia   . Lung nodule     Past Surgical History  Procedure Date  . Coronary artery bypass graft 2010  . Carotid endarterectomy     Family History  Problem Relation Age of Onset  . Asthma Mother   . Lung cancer Father     History  Substance Use Topics  . Smoking status: Current Everyday Smoker -- 1.0 packs/day for 30 years    Types: Cigarettes  . Smokeless tobacco: Not on file  . Alcohol Use: Not on file      Review of Systems    Constitutional: Negative for fever, chills and unexpected weight change.  Respiratory: Negative for shortness of breath.   Cardiovascular: Negative for chest pain.  Gastrointestinal: Negative for nausea, vomiting, abdominal pain, diarrhea and constipation.  Genitourinary: Negative for dysuria, frequency, hematuria and flank pain.  Musculoskeletal: Positive for back pain.    Allergies  Review of patient's allergies indicates no known allergies.  Home Medications   Current Outpatient Rx  Name Route Sig Dispense Refill  . ASPIRIN 81 MG PO TABS Oral Take 81 mg by mouth daily.       BP 148/103  Pulse 83  Temp 98.5 F (36.9 C) (Oral)  Resp 18  SpO2 97%  Physical Exam  Nursing note and vitals reviewed. Constitutional: He is oriented to person, place, and time. He appears well-developed and well-nourished. No distress.  HENT:  Head: Normocephalic.  Cardiovascular: Normal rate and regular rhythm.   Pulmonary/Chest: Effort normal and breath sounds normal.  Abdominal: Soft. He exhibits no distension and no mass. There is no tenderness.  Musculoskeletal:       Lumbar back: He exhibits no bony tenderness.       Back:       Patient very sore and tight over bilateral erectus muscles of lumbar spine.  Neurological: He is alert and oriented to person, place, and time. He has normal strength. No sensory deficit.  Skin: Skin is  warm.  Psychiatric: He has a normal mood and affect. His behavior is normal.    ED Course  Procedures   Dg Lumbar Spine Complete  01/07/2012  *RADIOLOGY REPORT*  Clinical Data: Back injury  LUMBAR SPINE - COMPLETE 4+ VIEW  Comparison: None.  Findings: Anatomic alignment.  No vertebral body compression deformity.  No pars defect.  Mild facet arthropathy and disc narrowing in the lower lumbar spine.  Vascular calcifications are noted.  Infrarenal abdominal aortic aneurysm is suspected based on vascular calcifications.  IMPRESSION: Degenerative change  No acute bony  pathology.  Abdominal aortic aneurysm is suspected .  A PET CT from 2012 does demonstrate infrarenal abdominal aortic aneurysm.  Follow-up ultrasound can be performed.  Original Report Authenticated By: Donavan Burnet, M.D.     1. Back pain   2. Muscle spasm of back   3. AAA (abdominal aortic aneurysm)       MDM  8:38 PM patient seen and evaluated. Patient no acute distress. Patient with mechanical injury and soreness low back.  Patient developed some nausea and vomiting after pain medication administration. Patient given IV fluids and Zofran. Patient now feeling much better. Patient has a soft abdomen without pulsatile mass. Pain is also improved back pain significantly with increased movements and comfort. At this time patient requesting to return home. Patient advised of x-ray findings including signs of AAA. Patient was offered additional testing to evaluate his AAA today to be sure this was not causing his problems. Patient refused any further testing and reports feeling well. Patient wishes to return home and followup with his doctors. Patient advised he'll require followup with his doctors for possible ultrasound studies. He expresses understanding. Patient and family given strict return precautions.      Angus Seller, [place] 01/07/12 2256

## 2012-01-07 NOTE — ED Notes (Signed)
Pt d/c home in NAD. Pt voiced understanding of d/c instructions and this RN stressed the importance of follow up care.

## 2012-01-07 NOTE — ED Notes (Signed)
Provider aware of vital signs.

## 2012-01-08 ENCOUNTER — Telehealth: Payer: Self-pay | Admitting: Pulmonary Disease

## 2012-01-08 NOTE — ED Provider Notes (Signed)
Medical screening examination/treatment/procedure(s) were performed by non-physician practitioner and as supervising physician I was immediately available for consultation/collaboration.  Nora Rooke, MD 01/08/12 0906 

## 2012-01-08 NOTE — Telephone Encounter (Signed)
I spoke with pt and he states he was dx with AAA last night from the ED and was told to f/u with his pcp. I advised pt KC is his lung doctor not his pcp and will need to see his PCP. Pt states he is going to call his cardiologists to see what they rec first. Pt needed nothing further

## 2012-01-12 ENCOUNTER — Encounter (HOSPITAL_COMMUNITY): Payer: Self-pay | Admitting: *Deleted

## 2012-01-12 ENCOUNTER — Observation Stay (HOSPITAL_COMMUNITY)
Admission: EM | Admit: 2012-01-12 | Discharge: 2012-01-12 | Disposition: A | Discharge status: Home / Self Care | Payer: Self-pay | Attending: Emergency Medicine | Admitting: Emergency Medicine

## 2012-01-12 ENCOUNTER — Emergency Department (HOSPITAL_COMMUNITY): Payer: Self-pay

## 2012-01-12 DIAGNOSIS — I739 Peripheral vascular disease, unspecified: Secondary | ICD-10-CM | POA: Insufficient documentation

## 2012-01-12 DIAGNOSIS — M549 Dorsalgia, unspecified: Secondary | ICD-10-CM

## 2012-01-12 DIAGNOSIS — I714 Abdominal aortic aneurysm, without rupture, unspecified: Principal | ICD-10-CM | POA: Insufficient documentation

## 2012-01-12 DIAGNOSIS — I2510 Atherosclerotic heart disease of native coronary artery without angina pectoris: Secondary | ICD-10-CM | POA: Insufficient documentation

## 2012-01-12 DIAGNOSIS — Z951 Presence of aortocoronary bypass graft: Secondary | ICD-10-CM | POA: Insufficient documentation

## 2012-01-12 DIAGNOSIS — F17200 Nicotine dependence, unspecified, uncomplicated: Secondary | ICD-10-CM | POA: Insufficient documentation

## 2012-01-12 DIAGNOSIS — R109 Unspecified abdominal pain: Secondary | ICD-10-CM | POA: Insufficient documentation

## 2012-01-12 DIAGNOSIS — I10 Essential (primary) hypertension: Secondary | ICD-10-CM | POA: Insufficient documentation

## 2012-01-12 LAB — BASIC METABOLIC PANEL
BUN: 10 mg/dL (ref 6–23)
Creatinine, Ser: 0.89 mg/dL (ref 0.50–1.35)
GFR calc Af Amer: >90 mL/min (ref >90)
GFR calc non Af Amer: >90 mL/min (ref >90)
Potassium: 3.9 mEq/L (ref 3.5–5.1)

## 2012-01-12 LAB — CBC
HCT: 49.7 % (ref 39.0–52.0)
MCHC: 35.4 g/dL (ref 30.0–36.0)
RDW: 13.5 % (ref 11.5–15.5)

## 2012-01-12 MED ORDER — HYDROMORPHONE HCL PF 1 MG/ML IJ SOLN
1.0000 mg | Freq: Once | INTRAMUSCULAR | Status: AC
  Administered 2012-01-12: 1 mg via INTRAVENOUS
  Filled 2012-01-12: qty 1

## 2012-01-12 MED ORDER — GI COCKTAIL ~~LOC~~
30.0000 mL | Freq: Once | ORAL | Status: AC
  Administered 2012-01-12: 30 mL via ORAL
  Filled 2012-01-12: qty 30

## 2012-01-12 MED ORDER — ONDANSETRON HCL 4 MG/2ML IJ SOLN
4.0000 mg | INTRAMUSCULAR | Status: DC | PRN
  Administered 2012-01-12: 4 mg via INTRAVENOUS
  Filled 2012-01-12: qty 2

## 2012-01-12 MED ORDER — LABETALOL HCL 5 MG/ML IV SOLN
20.0000 mg | Freq: Once | INTRAVENOUS | Status: AC
  Administered 2012-01-12: 20 mg via INTRAVENOUS
  Filled 2012-01-12: qty 4

## 2012-01-12 MED ORDER — IOHEXOL 350 MG/ML SOLN
100.0000 mL | Freq: Once | INTRAVENOUS | Status: AC | PRN
  Administered 2012-01-12: 100 mL via INTRAVENOUS

## 2012-01-12 NOTE — ED Provider Notes (Signed)
Medical screening examination/treatment/procedure(s) were conducted as a shared visit with non-physician practitioner(s) and myself.  I personally evaluated the patient during the encounter  Miyeko Mahlum, MD 01/12/12 1523 

## 2012-01-12 NOTE — ED Notes (Addendum)
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 Pt was seen here on the 10th for same. States it is not helping and pain was so intense last night that he fell to the ground.

## 2012-01-12 NOTE — ED Notes (Signed)
Discharged with instructions verb understand pt ambulates without difficulty to exit

## 2012-01-12 NOTE — ED Provider Notes (Signed)
Medical screening examination/treatment/procedure(s) were conducted as a shared visit with non-physician practitioner(s) and myself.  I personally evaluated the patient during the encounter Smoker. Has htn.  C/o back pain, leg weakness, abd pain.  Pain so severe last night that he fainted.  abd soft with epigastric ttp.  Radial pulses nl, dp and pt pulses diminished.  ? Aaa.   Will get ct and consult vasc surgery.     Cheri Guppy, MD 01/12/12 [phone]

## 2012-01-12 NOTE — ED Provider Notes (Addendum)
History     CSN: [id number]  Arrival date & time 01/12/12  4098   First MD Initiated Contact with Patient 01/12/12 1000      Chief Complaint  Patient presents with  . Back Pain    (Consider location/radiation/quality/duration/timing/severity/associated sxs/prior treatment) HPI Comments: Patient is a current every day smoker with a history of uncontrolled hypertension, CAD, CABG, polysubstance abuse and peripheral vascular disease presents emergency department with chief complaint of back pain. Pain is located in the thoracic/lumbar spine area and has been present for a couple of weeks now, but has progressively worsened.  Severity now 10/10 described as throbbing & sharp, worsened by the slightest movements.associated s/s include mild abdominal pain & bilateral leg weakness, but he denies any chest pain, shortness of breath, dyspnea on exertion, lightheadedness, hematuria, dizziness. Patient states when he got up last night to go to the restroom he could not make it due to pain.  He also reports an episode of syncope on Friday triggered by pain.  Patient was found this morning lying on the floor by his relative and was unable to move because of his pain.   Patient was evaluated here on the 10th and it was suspected that he had a AAA, further imaging was recommend which was refused by pt.   Patient is a 51 y.o. male presenting with back pain. The history is provided by the patient.  Back Pain  Associated symptoms include abdominal pain. Pertinent negatives include no chest pain, no fever, no numbness, no headaches, no dysuria and no weakness.    Past Medical History  Diagnosis Date  . Hypertension   . CAD (coronary artery disease)   . Hx of CABG 2010  . PVD (peripheral vascular disease)   . Polysubstance abuse   . Abnormal heart rhythm   . Heart attack 2010  . Hyperlipidemia   . Lung nodule     Past Surgical History  Procedure Date  . Coronary artery bypass graft 2010  . Carotid  endarterectomy     Family History  Problem Relation Age of Onset  . Asthma Mother   . Lung cancer Father     History  Substance Use Topics  . Smoking status: Current Everyday Smoker -- 1.0 packs/day for 30 years    Types: Cigarettes  . Smokeless tobacco: Not on file  . Alcohol Use: Yes      Review of Systems  Constitutional: Positive for diaphoresis and activity change. Negative for fever, chills and appetite change.  HENT: Negative for congestion.   Eyes: Negative for visual disturbance.  Respiratory: Negative for shortness of breath.   Cardiovascular: Negative for chest pain and leg swelling.  Gastrointestinal: Positive for abdominal pain.  Genitourinary: Negative for dysuria, urgency and frequency.  Musculoskeletal: Positive for back pain.  Neurological: Positive for syncope. Negative for dizziness, weakness, light-headedness, numbness and headaches.  Psychiatric/Behavioral: Negative for confusion.  All other systems reviewed and are negative.    Allergies  Review of patient's allergies indicates no known allergies.  Home Medications   Current Outpatient Rx  Name Route Sig Dispense Refill  . ASPIRIN 81 MG PO TABS Oral Take 81 mg by mouth daily.     [name] DIAZEPAM 5 MG PO TABS Oral Take 1 tablet (5 mg total) by mouth every 6 (six) hours as needed for anxiety (spasms). 10 tablet 0  . HYDROCODONE-ACETAMINOPHEN 5-325 MG PO TABS Oral Take 1 tablet by mouth every 6 (six) hours as needed for pain. 10 tablet  0  . IBUPROFEN 200 MG PO TABS Oral Take 400 mg by mouth every 6 (six) hours as needed. For pain      BP 131/100  Pulse 54  Temp 97.6 F (36.4 C) (Oral)  Resp 5  SpO2 98%  Physical Exam  Nursing note and vitals reviewed. Constitutional: He is oriented to person, place, and time. He appears well-developed and well-nourished. He appears distressed.       Pt tearful d/t pain, hypertensive 169/101  HENT:  Head: Normocephalic and atraumatic.  Eyes: Conjunctivae and  EOM are normal.  Neck: Normal range of motion.  Cardiovascular: Normal rate, regular rhythm and normal heart sounds.        Intact radial pulses, decreased pedal pulse bilaterally. no edema. RRR  Pulmonary/Chest: Effort normal.  Abdominal:       Tender to mild palpation, no pulsatile mass. Bowel sounds and pulse on auscultation  Musculoskeletal: Normal range of motion.       Unable to asses ROM d/t pain   Neurological: He is alert and oriented to person, place, and time.       Intact distal sensation. Hamstring strength 4/5 bilaterally   Skin: Skin is warm and dry. No rash noted. He is not diaphoretic.  Psychiatric: He has a normal mood and affect. His behavior is normal.    ED Course  Procedures (including critical care time)  Labs Reviewed  CBC - Abnormal; Notable for the following:    Hemoglobin 17.6 (*)     All other components within normal limits  BASIC METABOLIC PANEL - Abnormal; Notable for the following:    Glucose, Bld 186 (*)     All other components within normal limits   Ct Angio Chest W/cm &/or Wo Cm  01/12/2012  *RADIOLOGY REPORT*  Clinical Data:  Severe mid to lower back pain question aneurysm, history hypertension, coronary artery disease post CABG, peripheral vascular disease, hyperlipidemia, lung nodule  CT ANGIOGRAPHY CHEST, ABDOMEN AND PELVIS  Technique:  Multidetector CT imaging through the chest, abdomen and pelvis was performed using the standard protocol during bolus administration of intravenous contrast.  Multiplanar reconstructed images including MIPs were obtained and reviewed to evaluate the vascular anatomy.  Contrast: OMNIPAQUE IOHEXOL 350 MG/ML SOLN  Comparison:  CTA chest 03/18/2011; noncontrast PET CT 05/30/2011  CTA CHEST  Findings: On precontrast images, extensive atherosclerotic calcifications are identified with postsurgical changes of CABG. No aneurysmal dilatation or high density crescents are seen at the aortic wall. 11 mm diameter isthmic  thyroid nodule. No aortic dissection identified post contrast. Small hiatal hernia. Pulmonary arteries grossly patent on non dedicated exam. No thoracic adenopathy. Dependent atelectasis in both lungs. No infiltrate, pleural effusion or pneumothorax. Noncalcified 12 x 9 mm left lower lobe pulmonary nodule, previously 11 x 9 mm. No new mass, nodule, infiltrate, or pleural effusion. No acute osseous findings.   Review of the MIP images confirms the above findings.  IMPRESSION: Extensive atherosclerotic disease changes with evidence of prior CABG. No evidence of thoracic aortic aneurysm or dissection. Isthmic thyroid nodule 11 mm diameter, unchanged, did not accumulate FDG on prior PET CT. Stable left lower lobe noncalcified pulmonary nodule. Small hiatal hernia.  CTA ABDOMEN AND PELVIS  Findings: Aneurysmal dilatation of infrarenal abdominal aorta up to 4.1 x 3.7 cm image 196, extending 4.3 cm length. Eccentric thrombus within the aortic lumen. Aneurysm extends to above the aortic bifurcation though there is aneurysmal dilatation of the left common iliac artery up to 14 mm  diameter. Thrombus fills left common iliac artery, question occlusion versus sub total occlusion.  No perianeurysmal hemorrhage or infiltration identified to suggest leak/retroperitoneal hemorrhage. No evidence of aortic dissection. 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 Mild plaque at origin of the SMA, likely less than 50%. Plaque at origin of IMA though it is is patent/enhances. Mild narrowing of the origins of the renal arteries noted bilaterally.  Tiny cyst at posterior aspect lower pole left kidney 8 mm diameter. Liver, spleen, pancreas, kidneys, and adrenal glands otherwise normal. Normal appendix. Stomach and bowel loops unremarkable for technique. Minimal dilatation of proximal left inguinal canal containing fat question inguinal hernia. Unremarkable bladder, ureters and prostate. No mass, adenopathy,  free fluid, or inflammatory process.   Review of the MIP images confirms the above findings.  IMPRESSION: 4 x 1 x 3.7 cm diameter abdominal aortic aneurysm without perianeurysmal hemorrhage/infiltration to suggest leak. 1.4 cm diameter aneurysmal dilatation of left common iliac artery, filled with low attenuation thrombus question occlusion versus sub total occlusion. Stenoses at the origins of the celiac artery, less at SMA, IMA and renal arteries. Cannot exclude tiny fat containing left inguinal hernia.  Original Report Authenticated By: Lollie Marrow, M.D.   Ct Angio Abd/pel W/ And/or W/o  01/12/2012  *RADIOLOGY REPORT*  Clinical Data:  Severe mid to lower back pain question aneurysm, history hypertension, coronary artery disease post CABG, peripheral vascular disease, hyperlipidemia, lung nodule  CT ANGIOGRAPHY CHEST, ABDOMEN AND PELVIS  Technique:  Multidetector CT imaging through the chest, abdomen and pelvis was performed using the standard protocol during bolus administration of intravenous contrast.  Multiplanar reconstructed images including MIPs were obtained and reviewed to evaluate the vascular anatomy.  Contrast: OMNIPAQUE IOHEXOL 350 MG/ML SOLN  Comparison:  CTA chest 03/18/2011; noncontrast PET CT 05/30/2011  CTA CHEST  Findings: On precontrast images, extensive atherosclerotic calcifications are identified with postsurgical changes of CABG. No aneurysmal dilatation or high density crescents are seen at the aortic wall. 11 mm diameter isthmic thyroid nodule. No aortic dissection identified post contrast. Small hiatal hernia. Pulmonary arteries grossly patent on non dedicated exam. No thoracic adenopathy. Dependent atelectasis in both lungs. No infiltrate, pleural effusion or pneumothorax. Noncalcified 12 x 9 mm left lower lobe pulmonary nodule, previously 11 x 9 mm. No new mass, nodule, infiltrate, or pleural effusion. No acute osseous findings.   Review of the MIP images confirms the above  findings.  IMPRESSION: Extensive atherosclerotic disease changes with evidence of prior CABG. No evidence of thoracic aortic aneurysm or dissection. Isthmic thyroid nodule 11 mm diameter, unchanged, did not accumulate FDG on prior PET CT. Stable left lower lobe noncalcified pulmonary nodule. Small hiatal hernia.  CTA ABDOMEN AND PELVIS  Findings: Aneurysmal dilatation of infrarenal abdominal aorta up to 4.1 x 3.7 cm image 196, extending 4.3 cm length. Eccentric thrombus within the aortic lumen. Aneurysm extends to above the aortic bifurcation though there is aneurysmal dilatation of the left common iliac artery up to 14 mm diameter. Thrombus fills left common iliac artery, question occlusion versus sub total occlusion.  No perianeurysmal hemorrhage or infiltration identified to suggest leak/retroperitoneal hemorrhage. No evidence of aortic dissection. 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 Mild plaque at origin of the SMA, likely less than 50%. Plaque at origin of IMA though it is is patent/enhances. Mild narrowing of the origins of the renal arteries noted bilaterally.  Tiny cyst at posterior aspect lower pole left  kidney 8 mm diameter. Liver, spleen, pancreas, kidneys, and adrenal glands otherwise normal. Normal appendix. Stomach and bowel loops unremarkable for technique. Minimal dilatation of proximal left inguinal canal containing fat question inguinal hernia. Unremarkable bladder, ureters and prostate. No mass, adenopathy, free fluid, or inflammatory process.   Review of the MIP images confirms the above findings.  IMPRESSION: 4 x 1 x 3.7 cm diameter abdominal aortic aneurysm without perianeurysmal hemorrhage/infiltration to suggest leak. 1.4 cm diameter aneurysmal dilatation of left common iliac artery, filled with low attenuation thrombus question occlusion versus sub total occlusion. Stenoses at the origins of the celiac artery, less at SMA, IMA and renal arteries.  Cannot exclude tiny fat containing left inguinal hernia.  Original Report Authenticated By: Lollie Marrow, M.D.     No diagnosis found.    MDM  Back pain, Abdominal pain, syncope, Hypertension, AAA  Consult: Vascular surgery, Dr. Arbie Cookey; discussed above CT findings and will come see pt in the ER.  The patient appears reasonably stabilized for admission considering the current resources, flow, and capabilities available in the ED at this time, and I doubt any other [facility] requiring further screening and/or treatment in the ED prior to admission.     3:26 PM - pt states that he does not want to be admitted to hospital and is comfortable going home. Pt does not appear distressed at all in comparison to earlier. Pt with history of polysubstance abuse and non compliance. Explained to pt I am not comfortable giving him a pain prescription and discussed conservative home therapies. Pt is to establish relationship with PCP Dr. Loleta Chance this week.       Jaci Carrel, PA-C 01/12/12 [address], PA-C 01/12/12 1530

## 2012-01-12 NOTE — Consult Note (Signed)
The patient presents to the emergency room today with back pain has been present since Friday. He does have a history of chronic back pain reports this to become more severe and he presented to the emergency room for further evaluation. He currently is denying any abdominal pain. 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 Vascular consultation was requested. The patient currently is comfortable on the ER stretcher specifically denying any abdominal pain he reports his back pain is better with pain medication. The pain is associated with movement and positioning causes more pain. It is rated as severe pain.  Past medical history is significant for coronary bypass grafting and combined carotid endarterectomy in 2010. The patient reports that he was supposed to followup regarding moderate stenosis in the left carotid did not keep his followup. I will arrange this as an outpatient.  Past Medical History  Diagnosis Date  . Hypertension   . CAD (coronary artery disease)   . Hx of CABG 2010  . PVD (peripheral vascular disease)   . Polysubstance abuse   . Abnormal heart rhythm   . Heart attack 2010  . Hyperlipidemia   . Lung nodule     History  Substance Use Topics  . Smoking status: Current Everyday Smoker -- 1.0 packs/day for 30 years    Types: Cigarettes  . Smokeless tobacco: Not on file  . Alcohol Use: Yes    Family History  Problem Relation Age of Onset  . Asthma Mother   . Lung cancer Father     No Known Allergies  Current facility-administered medications:gi cocktail (Maalox,Lidocaine,Donnatal), 30 mL, Oral, Once, Lisette Paz, PA-C, 30 mL at 01/12/12 1349;  HYDROmorphone (DILAUDID) injection 1 mg, 1 mg, Intravenous, Once, [employer], PA-C, 1 mg at 01/12/12 1109;  HYDROmorphone (DILAUDID) injection 1 mg, 1 mg, Intravenous, Once, [employer], PA-C, 1 mg at  01/12/12 1149 HYDROmorphone (DILAUDID) injection 1 mg, 1 mg, Intravenous, Once, [employer], PA-C, 1 mg at 01/12/12 1349;  iohexol (OMNIPAQUE) 350 MG/ML injection 100 mL, 100 mL, Intravenous, Once PRN, Medication Radiologist, MD, 100 mL at 01/12/12 1233;  labetalol (NORMODYNE,TRANDATE) injection 20 mg, 20 mg, Intravenous, Once, Lisette Paz, PA-C, 20 mg at 01/12/12 1141 ondansetron (ZOFRAN) injection 4 mg, 4 mg, Intravenous, PRN, Lisette Paz, PA-C, 4 mg at 01/12/12 1109 Current outpatient prescriptions:aspirin 81 MG tablet, Take 81 mg by mouth daily. , Disp: , Rfl: ;  diazepam (VALIUM) 5 MG tablet, Take 1 tablet (5 mg total) by mouth every 6 (six) hours as needed for anxiety (spasms)., Disp: 10 tablet, Rfl: 0;  HYDROcodone-acetaminophen (NORCO) 5-325 MG per tablet, Take 1 tablet by mouth every 6 (six) hours as needed for pain., Disp: 10 tablet, Rfl: 0 ibuprofen (ADVIL,MOTRIN) 200 MG tablet, Take 400 mg by mouth every 6 (six) hours as needed. For pain, Disp: , Rfl:   BP 149/103  Pulse 72  Temp 98.1 F (36.7 C) (Oral)  Resp 18  SpO2 99%  There is no height or weight on file to calculate BMI.       Review of systems: Negative except for past history  Physical exam well-developed white male no acute distress lying on a stretcher. 2+ radial pulses bilaterally 2+ right dorsalis pedis pulse and absent left pedal pulse Chest clear to auscultation bilaterally Neurologically he is grossly intact Extremities without cyanosis or deformities or edema Abdominal exam: No tenderness noted  no pulsatile mass noted Skin without ulcers or rashes  CT scan abdomen was reviewed. This does show 4.1 cm infrarenal aneurysm with no evidence of leak. He does have evidence of chronic occlusion of his left common iliac artery with some dilatation.  Impression and plan: Incidental finding of an asymptomatic 4.1 cm infrarenal abdominal aortic aneurysm. The patient does report clear-cut left leg claudication symptoms  which are not limiting to him. Patient also has questionable history of left carotid stenosis which is not had followup. I discussed all this at length with the patient and his family present. I feel it is acceptable for discharge from a vascular surgery standpoint. I will arrange followup in our office for ultrasound of his aorta in one year and followup of his left carotid as indicated

## 2012-01-13 ENCOUNTER — Telehealth: Payer: Self-pay | Admitting: Vascular Surgery

## 2012-01-13 ENCOUNTER — Other Ambulatory Visit: Payer: Self-pay | Admitting: *Deleted

## 2012-01-13 DIAGNOSIS — Z48812 Encounter for surgical aftercare following surgery on the circulatory system: Secondary | ICD-10-CM

## 2012-01-13 DIAGNOSIS — I6529 Occlusion and stenosis of unspecified carotid artery: Secondary | ICD-10-CM

## 2012-01-13 NOTE — ED Provider Notes (Signed)
Medical screening examination/treatment/procedure(s) were conducted as a shared visit with non-physician practitioner(s) and myself.  I personally evaluated the patient during the encounter  Woodfin Kiss, MD 01/13/12 1548 

## 2012-01-13 NOTE — Telephone Encounter (Addendum)
Message copied by Rosalyn Charters on Tue Jan 13, 2012 12:30 PM ------      Message from: Melene Plan      Created: Tue Jan 13, 2012 11:16 AM       Dr Ilean Skill saw him in the hosp. Dr Deneise Lever did a Right CEA on 10/11/08.He needs a carotid duplex and office visit with Rusty/Dr

## 2012-01-16 ENCOUNTER — Encounter: Payer: Self-pay | Admitting: Neurosurgery

## 2012-01-19 ENCOUNTER — Ambulatory Visit (INDEPENDENT_AMBULATORY_CARE_PROVIDER_SITE_OTHER): Payer: Self-pay | Admitting: Vascular Surgery

## 2012-01-19 ENCOUNTER — Ambulatory Visit (INDEPENDENT_AMBULATORY_CARE_PROVIDER_SITE_OTHER): Payer: Self-pay | Admitting: Neurosurgery

## 2012-01-19 ENCOUNTER — Encounter: Payer: Self-pay | Admitting: Neurosurgery

## 2012-01-19 VITALS — BP 187/118 | HR 72 | Resp 16 | Ht 70.0 in | Wt 178.8 lb

## 2012-01-19 DIAGNOSIS — I6522 Occlusion and stenosis of left carotid artery: Secondary | ICD-10-CM

## 2012-01-19 DIAGNOSIS — I6529 Occlusion and stenosis of unspecified carotid artery: Secondary | ICD-10-CM

## 2012-01-19 DIAGNOSIS — Z48812 Encounter for surgical aftercare following surgery on the circulatory system: Secondary | ICD-10-CM

## 2012-01-19 HISTORY — DX: Occlusion and stenosis of left carotid artery: I65.22

## 2012-01-19 NOTE — Progress Notes (Signed)
Carotid duplex performed @ [facility] 01/19/2012 

## 2012-01-19 NOTE — Progress Notes (Addendum)
[facility] Carotid Office Note  CC: Six-month carotid duplex Referring Physician: Brabham  History of Present Illness: 50 year old outpatient Dr. Myra Gianotti seen for known carotid stenosis. The patient was seen in the emergency room by Dr. Arbie Cookey just a couple weeks ago when he was evaluated for back pain and there was an incidental finding of a 4.1 cm AAA. At that point Dr. Arbie Cookey recommended yearly followup in that has been scheduled. The patient denies any signs or symptoms of CVA, TIA, amaurosis fugax or any neural deficit. The patient's main complaint is back pain. The patient is status post right CEA in April 2010.  Past Medical History  Diagnosis Date  . Hypertension   . CAD (coronary artery disease)   . Hx of CABG 2010  . PVD (peripheral vascular disease)   . Polysubstance abuse   . Abnormal heart rhythm   . Heart attack 2010  . Hyperlipidemia   . Lung nodule     ROS: [x]  Positive   [ ]  Denies    General: [ ]  Weight loss, [ ]  Fever, [ ]  chills Neurologic: [x ] Dizziness, [ ]  Blackouts, [ ]  Seizure [ ]  Stroke, [ ]  "Mini stroke", [ ]  Slurred speech, [ ]  Temporary blindness; [ x] weakness in arms or legs, [ ]  Hoarseness Cardiac: [x ] Chest pain/pressure, [ ]  Shortness of breath at rest [ ]  Shortness of breath with exertion, [ x] Atrial fibrillation or irregular heartbeat Vascular: [x ] Pain in legs with walking, [ ]  Pain in legs at rest, [ ]  Pain in legs at night,  [ ]  Non-healing ulcer, [ ]  Blood clot in vein/DVT,   Pulmonary: [ ]  Home oxygen, [ ]  Productive cough, [ ]  Coughing up blood, [ ]  Asthma,  [ ]  Wheezing Musculoskeletal:  [ ]  Arthritis, [ ]  Low back pain, [ ]  Joint pain Hematologic: [ ]  Easy Bruising, [ ]  Anemia; [ ]  Hepatitis Gastrointestinal: [ ]  Blood in stool, [ ]  Gastroesophageal Reflux/heartburn, [ ]  Trouble swallowing Urinary: [ ]  chronic Kidney disease, [ ]  on HD - [ ]  MWF or [ ]  TTHS, [ ]  Burning with urination, [ ]  Difficulty  urinating Skin: [ ]  Rashes, [ ]  Wounds Psychological: [ ]  Anxiety, [ ]  Depression   Social History History  Substance Use Topics  . Smoking status: Current Everyday Smoker -- 1.0 packs/day for 30 years    Types: Cigarettes  . Smokeless tobacco: Not on file  . Alcohol Use: Yes    Family History Family History  Problem Relation Age of Onset  . Asthma Mother   . Lung cancer Father     No Known Allergies  Current Outpatient Prescriptions  Medication Sig Dispense Refill  . aspirin 81 MG tablet Take 81 mg by mouth daily.       [name] ibuprofen (ADVIL,MOTRIN) 200 MG tablet Take 400 mg by mouth every 6 (six) hours as needed. For pain        Physical Examination  Filed Vitals:   01/19/12 1558  BP: 187/118  Pulse: 72  Resp:     Body mass index is 25.66 kg/(m^2).  General:  WDWN in NAD Gait: Normal HEENT: WNL Eyes: Pupils equal Pulmonary: normal non-labored breathing , without Rales, rhonchi,  wheezing Cardiac: RRR, without  Murmurs, rubs or gallops; Abdomen: soft, NT, no masses Skin: no rashes, ulcers noted  Vascular Exam Pulses: 3+ radial pulses bilaterally Carotid bruits: Mild carotid bruit heard on the left, right sided carotid  pulse to auscultation. Extremities without ischemic changes, no Gangrene , no cellulitis; no open wounds;  Musculoskeletal: no muscle wasting or atrophy   Neurologic: A&O X 3; Appropriate Affect ; SENSATION: normal; MOTOR FUNCTION:  moving all extremities equally. Speech is fluent/normal  Non-Invasive Vascular Imaging CAROTID DUPLEX 01/19/2012  Right ICA 20 - 39 % stenosis Left ICA 60 - 79 % stenosis   ASSESSMENT/PLAN: Asymptomatic patient with significant left-sided ICA stenosis, and a new finding of a 4.1 cm AAA. The patient will followup here in 6 months for repeat carotid duplex, and one year from his hospital visit for AAA duplex. The patient's questions were encouraged and answered, he is in agreement with this plan. The patient did have  significantly elevated blood pressure in both arms today and was recommended strongly he contact his primary care doctor in the next day or 2 to discuss medications and he agreed.  Lauree Chandler ANP   Clinic MD: Myra Gianotti

## 2012-01-26 NOTE — Procedures (Unsigned)
CAROTID DUPLEX EXAM  INDICATION:  Carotid stenosis.  HISTORY: Diabetes:  No Cardiac:  CAD, CABG, MI Hypertension:  Yes Smoking:  Currently Previous Surgery:  Right carotid endarterectomy 10/11/2008 CV History:  Currently asymptomatic, residual numbness of the right side of the neck since April 2010. Amaurosis [place] No, Paresthesias No, Hemiparesis No                                      RIGHT               LEFT Brachial systolic pressure:         174                 166 Brachial Doppler waveforms:         WNL                 WNL Vertebral direction of flow:        Antegrade           Antegrade DUPLEX VELOCITIES (cm/sec) CCA peak systolic                   54                  71 ECA peak systolic                   540                 141 ICA peak systolic                   109-P/49-P          161 ICA end diastolic                   38-P/15-P           70 PLAQUE MORPHOLOGY:                  Homogenous          Heterogenous PLAQUE AMOUNT:                      Extensive           Moderate to severe PLAQUE LOCATION:                    Endarterectomy/ECA  CCA/ICA  IMPRESSION: 1. Right internal carotid artery is patent with history of     endarterectomy, although previous site of DPA is larger in diameter     when compared to proximal to mid common carotid artery and     extensive homogenous plaque is noted, velocities are not     hemodynamically significant. 2. Right external carotid artery stenosis with peak systolic velocity     of 540 cm/sec present. 3. Left internal carotid artery stenosis present in the 60-79% range     by end diastolic velocity criteria. 4. Bilateral vertebral arteries appear patent and antegrade.  ___________________________________________ V. Charlena Cross, MD  SH/MEDQ  D:  01/19/2012  T:  01/19/2012  Job:  [id number]

## 2012-02-02 ENCOUNTER — Ambulatory Visit (HOSPITAL_COMMUNITY)
Admission: RE | Admit: 2012-02-02 | Discharge: 2012-02-02 | Disposition: A | Discharge status: Home / Self Care | Payer: Self-pay | Source: Ambulatory Visit | Attending: Family Medicine | Admitting: Family Medicine

## 2012-02-02 ENCOUNTER — Ambulatory Visit (INDEPENDENT_AMBULATORY_CARE_PROVIDER_SITE_OTHER): Payer: Self-pay | Admitting: Family Medicine

## 2012-02-02 ENCOUNTER — Encounter: Payer: Self-pay | Admitting: Family Medicine

## 2012-02-02 VITALS — BP 198/108 | HR 86 | Temp 98.7°F | Ht 70.0 in | Wt 177.0 lb

## 2012-02-02 DIAGNOSIS — Z720 Tobacco use: Secondary | ICD-10-CM | POA: Insufficient documentation

## 2012-02-02 DIAGNOSIS — R079 Chest pain, unspecified: Secondary | ICD-10-CM

## 2012-02-02 DIAGNOSIS — Z598 Other problems related to housing and economic circumstances: Secondary | ICD-10-CM

## 2012-02-02 DIAGNOSIS — E785 Hyperlipidemia, unspecified: Secondary | ICD-10-CM

## 2012-02-02 DIAGNOSIS — I25810 Atherosclerosis of coronary artery bypass graft(s) without angina pectoris: Secondary | ICD-10-CM

## 2012-02-02 DIAGNOSIS — F17200 Nicotine dependence, unspecified, uncomplicated: Secondary | ICD-10-CM

## 2012-02-02 DIAGNOSIS — I10 Essential (primary) hypertension: Secondary | ICD-10-CM

## 2012-02-02 DIAGNOSIS — Z5987 Material hardship due to limited financial resources, not elsewhere classified: Secondary | ICD-10-CM

## 2012-02-02 HISTORY — DX: Essential (primary) hypertension: I10

## 2012-02-02 MED ORDER — PRAVASTATIN SODIUM 40 MG PO TABS: 40.0000 mg | ORAL_TABLET | Freq: Every day | ORAL | Status: DC

## 2012-02-02 MED ORDER — LISINOPRIL-HYDROCHLOROTHIAZIDE 10-12.5 MG PO TABS: 1.0000 | ORAL_TABLET | Freq: Every day | ORAL | Status: DC

## 2012-02-02 MED ORDER — CARVEDILOL 12.5 MG PO TABS: 12.5000 mg | ORAL_TABLET | Freq: Two times a day (BID) | ORAL | Status: DC

## 2012-02-02 NOTE — Patient Instructions (Addendum)
Nice to meet you. Make sure to contact Rudell Cobb to relieve some financial stress. Need to start blood pressure medication to relieve stress on your heart and blood vessels.  Avoid using excess motrin/ibuprofen. Quitting smoking would be very beneficial. Make an appointment again in 1-2 weeks.  Hypertension As your heart beats, it forces blood through your arteries. This force is your blood pressure. If the pressure is too high, it is called hypertension (HTN) or high blood pressure. HTN is dangerous because you may have it and not know it. High blood pressure may mean that your heart has to work harder to pump blood. Your arteries may be narrow or stiff. The extra work puts you at risk for heart disease, stroke, and other problems.  Blood pressure consists of two numbers, a higher number over a lower, 110/72, for example. It is stated as "110 over 72." The ideal is below 120 for the top number (systolic) and under 80 for the bottom (diastolic). Write down your blood pressure today. You should pay close attention to your blood pressure if you have certain conditions such as:  Heart failure.   Prior heart attack.   Diabetes   Chronic kidney disease.   Prior stroke.   Multiple risk factors for heart disease.  To see if you have HTN, your blood pressure should be measured while you are seated with your arm held at the level of the heart. It should be measured at least twice. A one-time elevated blood pressure reading (especially in the Emergency Department) does not mean that you need treatment. There may be conditions in which the blood pressure is different between your right and left arms. It is important to see your caregiver soon for a recheck. Most people have essential hypertension which means that there is not a specific cause. This type of high blood pressure may be lowered by changing lifestyle factors such as:  Stress.   Smoking.   Lack of exercise.   Excessive weight.    Drug/tobacco/alcohol use.   Eating less salt.  Most people do not have symptoms from high blood pressure until it has caused damage to the body. Effective treatment can often prevent, delay or reduce that damage. TREATMENT  When a cause has been identified, treatment for high blood pressure is directed at the cause. There are a large number of medications to treat HTN. These fall into several categories, and your caregiver will help you select the medicines that are best for you. Medications may have side effects. You should review side effects with your caregiver. If your blood pressure stays high after you have made lifestyle changes or started on medicines,   Your medication(s) may need to be changed.   Other problems may need to be addressed.   Be certain you understand your prescriptions, and know how and when to take your medicine.   Be sure to follow up with your caregiver within the time frame advised (usually within two weeks) to have your blood pressure rechecked and to review your medications.   If you are taking more than one medicine to lower your blood pressure, make sure you know how and at what times they should be taken. Taking two medicines at the same time can result in blood pressure that is too low.  SEEK IMMEDIATE MEDICAL CARE IF:  You develop a severe headache, blurred or changing vision, or confusion.   You have unusual weakness or numbness, or a faint feeling.   You have  severe chest or abdominal pain, vomiting, or breathing problems.  MAKE SURE YOU:   Understand these instructions.   Will watch your condition.   Will get help right away if you are not doing well or get worse.  Document Released: 06/16/2005 Document Revised: 06/05/2011 Document Reviewed: 02/04/2008 [facility] Patient Information 2012 [place], [place].

## 2012-02-02 NOTE — Assessment & Plan Note (Signed)
Recommended cessation. Barrier is anxiety currently. F/u next visit.

## 2012-02-02 NOTE — Assessment & Plan Note (Signed)
Provided with info packet for orange card. SW consulted.

## 2012-02-02 NOTE — Assessment & Plan Note (Signed)
Will continue daily aspirin. Recommend smoking cessation. Will start beta blocker, ACE inhibitor, statin today.

## 2012-02-02 NOTE — Assessment & Plan Note (Signed)
Start pravastatin, followup lipid panel in 3 months. Likely will need high dose.

## 2012-02-02 NOTE — Assessment & Plan Note (Signed)
Patient with chronic chest pain since his post operative course from CABG. Most likely related tochest wall scarring. Normal EKG today. Will continue with medications and it risk factor modification. Ideally could send for echo to evaluate for pericardial changes.

## 2012-02-02 NOTE — Assessment & Plan Note (Signed)
Chronically uncontrolled in the setting of known CAD and AAA. No sign of malignant hypertension and negative EKG today. Will start beta blocker and low-dose ACE inhibitor/HCTZ according to [facility] formulary. Patient to start Coreg 12.5 mg twice a day tonight and start lisinopril 10/12.5 in 2 days. Followup in one to 2 weeks. Discussed applying for Medicaid versus orange card. Obtain labs at next visit including renal function, cholesterol, glucose, TSH.

## 2012-02-02 NOTE — Progress Notes (Signed)
Subjective:    Patient ID: Randy [name], male    DOB: 02-Jan-1961, 51 y.o.   MRN: [id number]  HPI New patient to establish care.  1. HTN. Seen by multiple providers recently with HTN. Most recent was 01/19/12 visit with blood pressure 180/118. Patient was told to find a PCP for treatment. He states he has never been on antihypertensive medication despite the fact he had a coronary artery bypass and MI in 2010. Recently found to have a 4 centimeter AAA during in ED visit. States he has some intermittent blurry vision and spots in his vision for the past several months. Has been taking ibuprofen for back pain. Smokes 2-3 cigarettes per day. Endorses some chronic chest pain with inspiration present since CABG in 2010. Denies leg edema, increased exertional dyspnea.  2. Pulmonary nodule. Followed by CT scan by Pulmonology at [facility].   3. HLD. States this has never been treated due to no insurance. Lab Results  Component Value Date   CHOL 283* 03/19/2011   HDL 32* 03/19/2011   LDLCALC 208* 03/19/2011   TRIG 214* 03/19/2011   CHOLHDL 8.8 03/19/2011   4. Tobacco abuse. Cut down to 2-3 cigarrettes daily. Continues due to anxiety. Not ready to quit.  Past Medical History  Diagnosis Date  . Hypertension   . CAD (coronary artery disease)   . Hx of CABG 2010  . PVD (peripheral vascular disease)   . Polysubstance abuse   . Abnormal heart rhythm   . Heart attack 2010  . Hyperlipidemia   . Lung nodule   . Irregular heartbeat   . AAA (abdominal aortic aneurysm) July 2013   Past Surgical History  Procedure Date  . Coronary artery bypass graft 2010  . Carotid endarterectomy    Family History  Problem Relation Age of Onset  . Asthma Mother   . Diabetes Mother   . Hyperlipidemia Mother   . Hypertension Mother   . Lung cancer Father   . Cancer Father   . Stroke Sister    History   Social History  . Marital Status: Widowed    Spouse Name: N/A    Number of Children: Y  . Years of  Education: N/A   Occupational History  . not working.     Social History Main Topics  . Smoking status: Current Everyday Smoker -- 1.0 packs/day for 30 years    Types: Cigarettes  . Smokeless tobacco: Not on file  . Alcohol Use: Yes  . Drug Use: No  . Sexually Active: Not on file   Other Topics Concern  . Not on file   Social History Narrative   Pt is widowed and lives alone.    Review of Systems See HPI otherwise negative.    Objective:   Physical Exam  Vitals reviewed. Constitutional: He is oriented to person, place, and time. He appears well-developed and well-nourished. No distress.  HENT:  Head: Normocephalic and atraumatic.  Right Ear: External ear normal.  Left Ear: External ear normal.  Nose: Nose normal.  Mouth/Throat: Oropharynx is clear and moist. No oropharyngeal exudate.  Eyes: EOM are normal. Pupils are equal, round, and reactive to light.  Neck: Normal range of motion. Neck supple. No thyromegaly present.  Cardiovascular: Normal rate, regular rhythm and normal heart sounds.   No murmur heard. Pulmonary/Chest: Effort normal and breath sounds normal. No respiratory distress. He has no wheezes. He has no rales.  Abdominal: Soft. Bowel sounds are normal. He exhibits no distension. There  is no tenderness. There is no rebound.  Musculoskeletal: He exhibits no edema and no tenderness.       Normal gait.  Neurological: He is alert and oriented to person, place, and time. No cranial nerve deficit. He exhibits normal muscle tone. Coordination normal.  Skin: No rash noted. He is not diaphoretic.  Psychiatric:       Mildly anxious. Normal speech and dress.     EKG: normal EKG, normal sinus rhythm, 72 bpm,  no ischemic changes or abnormal intervals.       Assessment & Plan:

## 2012-02-03 ENCOUNTER — Telehealth: Payer: Self-pay | Admitting: Clinical

## 2012-02-03 NOTE — Telephone Encounter (Signed)
Clinical Child psychotherapist (CSW) received a referral to assist pt with the Medicaid process. CSW contacted pt and informed him of where to apply for both medicaid (DSS) and disability (SSA). CSW encouraged pt to contact CSW if he had any trouble with the process. Pt appreciative and had no additional concerns.  Theresia Bough, MSW, Theresia Majors [phone]

## 2012-02-03 NOTE — Telephone Encounter (Signed)
Message copied by Theresia Bough on Tue Feb 03, 2012 11:17 AM ------      Message from: Durwin Reges      Created: Mon Feb 02, 2012  4:18 PM      Regarding: financial assist       This patient may qualify for medicaid and needs some help. Poor literacy. Please contact him with information. He was given [employer] info if he doesn't get medicaid.

## 2012-02-10 ENCOUNTER — Encounter: Payer: Self-pay | Admitting: Family Medicine

## 2012-02-10 ENCOUNTER — Ambulatory Visit (INDEPENDENT_AMBULATORY_CARE_PROVIDER_SITE_OTHER): Payer: Self-pay | Admitting: Family Medicine

## 2012-02-10 VITALS — BP 158/89 | HR 66 | Temp 98.2°F | Ht 70.0 in | Wt 179.0 lb

## 2012-02-10 DIAGNOSIS — F411 Generalized anxiety disorder: Secondary | ICD-10-CM

## 2012-02-10 DIAGNOSIS — I10 Essential (primary) hypertension: Secondary | ICD-10-CM

## 2012-02-10 DIAGNOSIS — F419 Anxiety disorder, unspecified: Secondary | ICD-10-CM

## 2012-02-10 DIAGNOSIS — I25810 Atherosclerosis of coronary artery bypass graft(s) without angina pectoris: Secondary | ICD-10-CM

## 2012-02-10 DIAGNOSIS — E785 Hyperlipidemia, unspecified: Secondary | ICD-10-CM

## 2012-02-10 DIAGNOSIS — Z720 Tobacco use: Secondary | ICD-10-CM

## 2012-02-10 DIAGNOSIS — F17200 Nicotine dependence, unspecified, uncomplicated: Secondary | ICD-10-CM

## 2012-02-10 DIAGNOSIS — I739 Peripheral vascular disease, unspecified: Secondary | ICD-10-CM

## 2012-02-10 LAB — CBC
HCT: 50.9 % (ref 39.0–52.0)
Hemoglobin: 17.1 g/dL — ABNORMAL HIGH (ref 13.0–17.0)
MCV: 90.9 fL (ref 78.0–100.0)
Platelets: 226 10*3/uL (ref 150–400)
RBC: 5.6 MIL/uL (ref 4.22–5.81)
WBC: 7.5 10*3/uL (ref 4.0–10.5)

## 2012-02-10 MED ORDER — LISINOPRIL-HYDROCHLOROTHIAZIDE 10-12.5 MG PO TABS: 2.0000 | ORAL_TABLET | Freq: Every day | ORAL | Status: DC

## 2012-02-10 NOTE — Patient Instructions (Addendum)
NIce to see you again Your blood pressure is much better! You have vascular disease. Best to quit smoking. Keep taking cholesterol medicine. Increase your lisinopril/HCTZ to twice daily. Make an appointment in 3 weeks.

## 2012-02-11 LAB — COMPREHENSIVE METABOLIC PANEL
BUN: 12 mg/dL (ref 6–23)
CO2: 29 mEq/L (ref 19–32)
Creat: 1 mg/dL (ref 0.50–1.35)
Glucose, Bld: 102 mg/dL — ABNORMAL HIGH (ref 70–99)
Total Bilirubin: 0.6 mg/dL (ref 0.3–1.2)
Total Protein: 6.5 g/dL (ref 6.0–8.3)

## 2012-02-12 DIAGNOSIS — F411 Generalized anxiety disorder: Secondary | ICD-10-CM

## 2012-02-12 DIAGNOSIS — I739 Peripheral vascular disease, unspecified: Secondary | ICD-10-CM | POA: Insufficient documentation

## 2012-02-12 HISTORY — DX: Generalized anxiety disorder: F41.1

## 2012-02-12 NOTE — Assessment & Plan Note (Signed)
Improved, still above goal. Will increase lisinopril-HCTZ to 20/25. Recheck renal function today. F/u in 3 weeks. Encouraged exercise as tolerated.

## 2012-02-12 NOTE — Assessment & Plan Note (Signed)
Has cut back. Not ready for cessation. F/u next visit.

## 2012-02-12 NOTE — Assessment & Plan Note (Signed)
Baseline LDL elevated to 165. His ideal goal is <70. He has never been compliant with a statin before, started pravastatin due to affordability. Will likely need to increase dose if tolerated. F/u lipid panel in 2 months.

## 2012-02-12 NOTE — Assessment & Plan Note (Signed)
Will re-address at next visit due to time constraints. With hx of substance abuse, not a great BZD candidate. Will screen for depression, mood disorder and likely start SSRI if desired.

## 2012-02-12 NOTE — Progress Notes (Signed)
  Subjective:    Patient ID: Randy [name], male    DOB: 07-11-1960, 51 y.o.   MRN: [id number]  HPI  1. HTN. Started coreg and lisinopril-HCTZ as directed. No side effects. His vision and energy are mildly improved. Denies any dyspnea, leg swelling.  2. HLD and history MI. Started pravastatin. No side effects noted. Has not been able to exercise much as he has some left thigh claudication with walking, resolves with rest.  3. PVD. Has been seen by Dr. Arbie Cookey for AAA, chronic stenosis of left iliac artery, asymptomatic carotid disease. Medical management recommended with surveillance. Patient still smoking, but cutting down. Unsure if he will be able to quit, states stress is a big barrier. Denies rest pain, syncope, abdominal pain.  4. Stress. "my nerves are shot." states he tried his friend's klonopin and slept well. Requests an rx for this today. No overwhelming depression. In with his daughter today.   Review of Systems See HPI otherwise negative.  reports that he has been smoking Cigarettes.  He has a 7.5 pack-year smoking history. He does not have any smokeless tobacco history on file.     Objective:   Physical Exam  Vitals reviewed. Constitutional: He is oriented to person, place, and time. He appears well-developed and well-nourished. No distress.  HENT:  Head: Normocephalic and atraumatic.  Mouth/Throat: Oropharynx is clear and moist.  Eyes: EOM are normal. Pupils are equal, round, and reactive to light.  Cardiovascular: Normal rate, regular rhythm and normal heart sounds.   No murmur heard. Pulmonary/Chest: Effort normal and breath sounds normal. No respiratory distress. He has no wheezes. He has no rales.  Abdominal: Soft. He exhibits no distension and no mass. There is no tenderness.  Musculoskeletal: He exhibits no edema and no tenderness.       Left thigh slightly atrophic. Left PT pulse diminished. Right DP pulse present.  Neurological: He is alert and oriented to person,  place, and time. No cranial nerve deficit. Coordination normal.  Skin: No rash noted. He is not diaphoretic.  Psychiatric: He has a normal mood and affect. His behavior is normal. Thought content normal.        Assessment & Plan:

## 2012-02-12 NOTE — Assessment & Plan Note (Signed)
Stable chest pain likely since CABG, nonexertional. Patient needs to continue being followed by cardiology. Now has orange card and will attempt new referral if required.

## 2012-02-12 NOTE — Assessment & Plan Note (Addendum)
No rest pain. Claudication symptoms stable to left thigh. Discussed behavior and medication therapy. Smoking cessation importance. BP and lipid control. He requested handicap sticker for car today, I discussed importance of maintaining activity level and will not provide this.

## 2012-03-10 ENCOUNTER — Ambulatory Visit: Payer: Self-pay | Admitting: Family Medicine

## 2012-07-09 ENCOUNTER — Other Ambulatory Visit: Payer: Self-pay | Admitting: *Deleted

## 2012-07-09 DIAGNOSIS — I6529 Occlusion and stenosis of unspecified carotid artery: Secondary | ICD-10-CM

## 2012-07-09 DIAGNOSIS — Z48812 Encounter for surgical aftercare following surgery on the circulatory system: Secondary | ICD-10-CM

## 2012-07-16 ENCOUNTER — Encounter: Payer: Self-pay | Admitting: Neurosurgery

## 2012-07-19 ENCOUNTER — Ambulatory Visit: Payer: Self-pay | Admitting: Neurosurgery

## 2012-07-19 ENCOUNTER — Other Ambulatory Visit: Payer: Self-pay

## 2012-10-28 ENCOUNTER — Telehealth: Payer: Self-pay | Admitting: Family Medicine

## 2012-10-28 ENCOUNTER — Encounter: Payer: Self-pay | Admitting: Family Medicine

## 2012-10-28 ENCOUNTER — Ambulatory Visit (INDEPENDENT_AMBULATORY_CARE_PROVIDER_SITE_OTHER): Payer: BC Managed Care – PPO | Admitting: Family Medicine

## 2012-10-28 VITALS — BP 162/90 | HR 71 | Temp 98.8°F | Ht 70.0 in | Wt 180.6 lb

## 2012-10-28 DIAGNOSIS — I10 Essential (primary) hypertension: Secondary | ICD-10-CM

## 2012-10-28 DIAGNOSIS — M545 Low back pain: Secondary | ICD-10-CM | POA: Insufficient documentation

## 2012-10-28 DIAGNOSIS — I714 Abdominal aortic aneurysm, without rupture, unspecified: Secondary | ICD-10-CM

## 2012-10-28 DIAGNOSIS — Z598 Other problems related to housing and economic circumstances: Secondary | ICD-10-CM

## 2012-10-28 DIAGNOSIS — E785 Hyperlipidemia, unspecified: Secondary | ICD-10-CM

## 2012-10-28 HISTORY — DX: Abdominal aortic aneurysm, without rupture, unspecified: I71.40

## 2012-10-28 HISTORY — DX: Abdominal aortic aneurysm, without rupture: I71.4

## 2012-10-28 MED ORDER — CYCLOBENZAPRINE HCL 10 MG PO TABS: 10.0000 mg | ORAL_TABLET | Freq: Three times a day (TID) | ORAL | Status: DC | PRN

## 2012-10-28 MED ORDER — HYDROCODONE-ACETAMINOPHEN 5-325 MG PO TABS: 1.0000 | ORAL_TABLET | Freq: Four times a day (QID) | ORAL | Status: DC | PRN

## 2012-10-28 MED ORDER — KETOROLAC TROMETHAMINE 60 MG/2ML IM SOLN
60.0000 mg | Freq: Once | INTRAMUSCULAR | Status: AC
  Administered 2012-10-28: 60 mg via INTRAMUSCULAR

## 2012-10-28 MED ORDER — LISINOPRIL-HYDROCHLOROTHIAZIDE 10-12.5 MG PO TABS: 2.0000 | ORAL_TABLET | Freq: Every day | ORAL | Status: DC

## 2012-10-28 MED ORDER — RANITIDINE HCL 300 MG PO CAPS: 300.0000 mg | ORAL_CAPSULE | Freq: Every evening | ORAL | Status: DC

## 2012-10-28 MED ORDER — CARVEDILOL 12.5 MG PO TABS: 12.5000 mg | ORAL_TABLET | Freq: Two times a day (BID) | ORAL | Status: DC

## 2012-10-28 MED ORDER — LOVASTATIN 20 MG PO TABS: 20.0000 mg | ORAL_TABLET | Freq: Every day | ORAL | Status: DC

## 2012-10-28 NOTE — Assessment & Plan Note (Signed)
Pravastatin is NOT on the $4 plan at any pharmacy, only at [facility]. Switching to lovastatin as this is on the $4 plan This will not likely be a high enough potency statin for his current hyperlipidemia. However taking ANY statin is better than not taking one at all, and he will not pravastatin secondary to financial difficulties. If needs increase in medication can attempt 40 mg (2 pills) of lovastatin in the future.

## 2012-10-28 NOTE — Telephone Encounter (Signed)
I just got off the phone with the [facility] pharmacy and they confirmed that Zantac IS on the $4. They said they will give the patient call. I sent in the lisinopril electronically

## 2012-10-28 NOTE — Assessment & Plan Note (Signed)
Improved on recheck. I provided him with refills for his blood pressure medications.

## 2012-10-28 NOTE — Assessment & Plan Note (Signed)
Reason for switch to lovastatin.

## 2012-10-28 NOTE — Assessment & Plan Note (Addendum)
Pain not evidence of current abdominal aneurysm. He has no thoracic pain.  Also fact that it is gradually improving albeit minimally and fact he has not had any catastrophic cardiovascular collapse argues against ruptured AAA. His blood pressure is much better on recheck once is able to sit for some time. He has good pulses bilateral lower extremities. No evidence of rupture nor dissection currently. Red flags regarding rupture/dissection provided for him.

## 2012-10-28 NOTE — Assessment & Plan Note (Signed)
Lumbar muscle spasm. Toradol 60 mg provided here in clinic. Treated with short-term course of hydrocodone plus Flexeril for pain relief. Limiting factor is that he has difficulty with ambulation secondary to chronic left leg pain. Did discuss natural course of back pain. Heat and massage for relief as well. Discussed that if he needs further medication for the back pain we can switch to something like tramadol.

## 2012-10-28 NOTE — Telephone Encounter (Signed)
Pt went to pharmacy to pick up meds and they didn't have the Lisinopril (says- no print) and the Zantac was not on the $4 plan - needs something cheaper  [facility] [address]

## 2012-10-28 NOTE — Telephone Encounter (Signed)
error 

## 2012-10-28 NOTE — Addendum Note (Signed)
Addended by: Farrell Ours on: 10/28/2012 05:38 PM   Modules accepted: Orders

## 2012-10-28 NOTE — Patient Instructions (Signed)
New medicines:  Lovastatin for cholesterol.  Stop the Pravastatin.  Ranitidine for acid reflux.  For your back:  Take the Norco if needed. Take the Flexeril for relief.    It was good to see you today

## 2012-10-28 NOTE — Progress Notes (Signed)
Subjective:    Randy [name] is a 52 y.o. male who presents to [facility] today with complaints of back pain:  1.  Back pain:  Describes aching pain in Left lumbar region of back, worse when standing or sitting for prolonged periods of time.  No pain cervical nor thoracic region of back.  Started on Sunday after he awoke. No inciting injuries or trauma. Is worse on left versus right side. Pain is 8 / 10, not relieved with OTC analgesics.  Does little exercise.   Has chronic numbness left thigh for the past year and a half. This is not changed with recent pain in his back. Otherwise he denies any  LE weakness or changes in gait.  No fevers or chills.  No incontinence of bladder or bowel.    Patient told he had herniated disc after ED visit for back pain several years ago. He has back spasms one to 2 times a year that only lasts about a week and a time. This feels like his usual back spasm. Also of note he had diagnosis of abdominal aortic aneurysm that was 4.1 cm in size. This was in July he has been followed by vascular surgery. His back pain has been constant and nonradiating he feels is gradually starting this very slowly improved in the past 4 days. Due to insurance he did not present until today.  2.   Hypertension:  Long-term problem for this patient.  No adverse effects from medication.  Not checking it regularly.  No HA, CP, dizziness, shortness of breath, palpitations, or LE swelling.  Has not been on any blood pressure medications "for months" as he has been out of them.  BP Readings from Last 3 Encounters:  10/28/12 186/96  02/10/12 158/89  02/02/12 198/108    3.  HLD:  Last lipid panel listed below.    Currently is supposed to be on pravastatin but he has not taken this for past several months to 2 insurance costs.  When taking the statin he denies any myalgias, icterus, jaundice.   Lab Results  Component Value Date   CHOL 283* 03/19/2011   CHOL  Value: 282        ATP III CLASSIFICATION:   <200     mg/dL   Desirable  161-096  mg/dL   Borderline High  >=045    mg/dL   High       * 4/0/9811   CHOL  Value: 287        ATP III CLASSIFICATION:  <200     mg/dL   Desirable  914-782  mg/dL   Borderline High  >=956    mg/dL   High       * 07/31/3084   Lab Results  Component Value Date   HDL 32* 03/19/2011   HDL 48 03/03/2010   HDL 36* 11/04/8467   Lab Results  Component Value Date   LDLCALC 208* 03/19/2011   LDLCALC  Value: 214        Total Cholesterol/HDL:CHD Risk Coronary Heart Disease Risk Table                     Men   Women  1/2 Average Risk   3.4   3.3  Average Risk       5.0   4.4  2 X Average Risk   9.6   7.1  3 X Average Risk  23.4   11.0  Use the calculated Patient Ratio above and the CHD Risk Table to determine the patient's CHD Risk.        ATP III CLASSIFICATION (LDL):  <100     mg/dL   Optimal  409-811  mg/dL   Near or Above                    Optimal  130-159  mg/dL   Borderline  914-782  mg/dL   High  >956     mg/dL   Very High* 07/31/3084   LDLCALC  Value: 206        Total Cholesterol/HDL:CHD Risk Coronary Heart Disease Risk Table                     Men   Women  1/2 Average Risk   3.4   3.3  Average Risk       5.0   4.4  2 X Average Risk   9.6   7.1  3 X Average Risk  23.4   11.0        Use the calculated Patient Ratio above and the CHD Risk Table to determine the patient's CHD Risk.        ATP III CLASSIFICATION (LDL):  <100     mg/dL   Optimal  578-469  mg/dL   Near or Above                    Optimal  130-159  mg/dL   Borderline  629-528  mg/dL   High  >413     mg/dL   Very High* 08/03/4008   Lab Results  Component Value Date   TRIG 214* 03/19/2011   TRIG 102 03/03/2010   TRIG 225* 10/06/2008   Lab Results  Component Value Date   CHOLHDL 8.8 03/19/2011   CHOLHDL 5.9 03/03/2010   CHOLHDL 8.0 10/06/2008    The following portions of the patient's history were reviewed and updated as appropriate: allergies, current medications, past medical history, family and social history, and  problem list. Patient is a nonsmoker.    PMH reviewed.  Past Medical History  Diagnosis Date  . Hypertension   . CAD (coronary artery disease)   . Hx of CABG 2010  . PVD (peripheral vascular disease)   . Polysubstance abuse   . Abnormal heart rhythm   . Heart attack 2010  . Hyperlipidemia   . Lung nodule   . Irregular heartbeat   . AAA (abdominal aortic aneurysm) July 2013   Past Surgical History  Procedure Laterality Date  . Coronary artery bypass graft  2010  . Carotid endarterectomy      Medications reviewed. Current Outpatient Prescriptions  Medication Sig Dispense Refill  . aspirin 81 MG tablet Take 81 mg by mouth daily.       . carvedilol (COREG) 12.5 MG tablet Take 1 tablet (12.5 mg total) by mouth 2 (two) times daily with a meal.  60 tablet  3  . cyclobenzaprine (FLEXERIL) 10 MG tablet Take 1 tablet (10 mg total) by mouth 3 (three) times daily as needed for muscle spasms.  30 tablet  0  . HYDROcodone-acetaminophen (NORCO) 5-325 MG per tablet Take 1 tablet by mouth every 6 (six) hours as needed for pain.  30 tablet  0  . ibuprofen (ADVIL,MOTRIN) 200 MG tablet Take 400 mg by mouth every 6 (six) hours as needed. For pain      . lisinopril-hydrochlorothiazide (PRINZIDE,ZESTORETIC) 10-12.5  MG per tablet Take 2 tablets by mouth daily.  30 tablet  3  . lovastatin (MEVACOR) 20 MG tablet Take 1 tablet (20 mg total) by mouth at bedtime.  90 tablet  3  . ranitidine (ZANTAC) 300 MG capsule Take 1 capsule (300 mg total) by mouth every evening.  30 capsule  3   No current facility-administered medications for this visit.    ROS as above otherwise neg.  No chest pain, palpitations, SOB, Fever, Chills, Abd pain, N/V/D.   Objective:   Physical Exam BP 186/96  Pulse 71  Temp(Src) 98.8 F (37.1 C) (Oral)  Ht 5\' 10"  (1.778 m)  Wt 180 lb 9.6 oz (81.92 kg)  BMI 25.91 kg/m2 Gen:  Alert, cooperative patient who appears stated age in no acute distress.  Vital signs reviewed. HEENT:  EOMI,  MMM Cardiac:  Regular rate and rhythm without murmur auscultated.  Good S1/S2. Pulm:  Clear to auscultation bilaterally with good air movement.  No wheezes or rales noted.   Abd:  Soft/nondistended/nontender.  Good bowel sounds throughout all four quadrants.  No masses noted, no palpable aneurysm nor pulsatile masses. Exts: Non edematous BL  LE, warm and well perfused.  Back:  Normal skin, Spine with normal alignment and no deformity.  No tenderness to vertebral process palpation.  Paraspinous muscles tender bilateral lumbar region but worse in the left side. He has palpable spasm noted left lumbar region. No pain thoracic area..   Range of motion is full at neck but decreased forward flexion secondary to pain in lumbar sacral regions.  Straight leg raise is positive for contralateral back pain. Neuro:  Sensation and motor function 5/5 bilateral lower extremities.  Patellar and Achilles  DTR's +2 patellar BL    No results found for this or any previous visit (from the past 72 hour(s)).

## 2012-11-04 ENCOUNTER — Telehealth: Payer: Self-pay | Admitting: Family Medicine

## 2012-11-04 NOTE — Telephone Encounter (Signed)
In order for the patient to get his Zantac (generic) on the $4 plan at [facility], it needs to be in the form of a tablet, not a capsule so he needs this corrected.  He would really appreciate if this could be corrected today please.

## 2012-11-05 ENCOUNTER — Other Ambulatory Visit: Payer: Self-pay | Admitting: Family Medicine

## 2012-11-05 MED ORDER — RANITIDINE HCL 300 MG PO TABS: 300.0000 mg | ORAL_TABLET | Freq: Every day | ORAL | Status: DC

## 2012-11-05 NOTE — Telephone Encounter (Signed)
rx sent to [facility]. Please notify pt.

## 2012-11-05 NOTE — Telephone Encounter (Signed)
Unable to reach patient,phone is off. Randy [name], Randy [name]

## 2012-12-30 ENCOUNTER — Other Ambulatory Visit (INDEPENDENT_AMBULATORY_CARE_PROVIDER_SITE_OTHER): Payer: BC Managed Care – PPO | Admitting: *Deleted

## 2012-12-30 DIAGNOSIS — I6529 Occlusion and stenosis of unspecified carotid artery: Secondary | ICD-10-CM

## 2012-12-30 DIAGNOSIS — Z48812 Encounter for surgical aftercare following surgery on the circulatory system: Secondary | ICD-10-CM

## 2013-01-03 ENCOUNTER — Telehealth: Payer: Self-pay | Admitting: Surgery

## 2013-01-03 ENCOUNTER — Ambulatory Visit (INDEPENDENT_AMBULATORY_CARE_PROVIDER_SITE_OTHER): Payer: BC Managed Care – PPO | Admitting: Surgery

## 2013-01-03 ENCOUNTER — Encounter: Payer: Self-pay | Admitting: Surgery

## 2013-01-03 DIAGNOSIS — M79609 Pain in unspecified limb: Secondary | ICD-10-CM | POA: Insufficient documentation

## 2013-01-03 DIAGNOSIS — I6529 Occlusion and stenosis of unspecified carotid artery: Secondary | ICD-10-CM

## 2013-01-03 NOTE — Telephone Encounter (Signed)
Informed patient of consult appt. with Dr.  Haroldine Laws on 01-10-13 at 12:30 for evaluation of vocal cords prior to have carotid surgery on 01-03-13 as per dr. Megan Salon orders on blue d/c 01-03-13

## 2013-01-03 NOTE — Progress Notes (Signed)
[facility] [place]   Patient name: Randy [name] MRN: [id number] DOB: 07/07/1960 Sex: male    No chief complaint on file.   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 On surveillance ultrasound he was found to have greater than 80% left carotid stenosis. He remained asymptomatic. Specifically, he denies numbness or weakness in either extremity. He denies slurred speech. He denies amaurosis fugax. The patient has a history of right carotid endarterectomy in 2010 which was done in conjunction with coronary artery bypass grafting.  The patient was found to have a dominant aortic aneurysm in July of 2013 on CT scan. He also suffers from claudication. CT scan suggested iliac occlusive disease. He states he cannot walk approximately one block before he has to stop because of leg pain.  The patient is managed for his hypercholesterolemia with a statin. He is on 2 medications for hypertension, however his blood pressure continues to run on the high side.  Past Medical History  Diagnosis Date  . Hypertension   . CAD (coronary artery disease)   . Hx of CABG 2010  . PVD (peripheral vascular disease)   . Polysubstance abuse   . Abnormal heart rhythm   . Heart attack 2010  . Hyperlipidemia   . Lung nodule   . Irregular heartbeat   . AAA (abdominal aortic aneurysm) July 2013    Past Surgical History  Procedure Laterality Date  . Coronary artery bypass graft  2010  . Carotid endarterectomy      History   Social History  . Marital Status: Widowed    Spouse Name: N/A    Number of Children: Y  . Years of Education: N/A   Occupational History  . not working.     Social History Main Topics  . Smoking status: Current Every Day Smoker -- 0.25 packs/day for 30 years    Types: Cigarettes  . Smokeless tobacco: Not on file  . Alcohol Use: Yes     Comment: 6/wk  . Drug Use: No  . Sexually Active: Not on file   Other  Topics Concern  . Not on file   Social History Narrative   Pt is widowed and lives alone.          Family History  Problem Relation Age of Onset  . Asthma Mother   . Diabetes Mother   . Hyperlipidemia Mother   . Hypertension Mother   . Lung cancer Father   . Cancer Father   . Stroke Sister     Allergies as of 01/03/2013  . (No Known Allergies)    Current Outpatient Prescriptions on File Prior to Visit  Medication Sig Dispense Refill  . aspirin 81 MG tablet Take 81 mg by mouth daily.       . carvedilol (COREG) 12.5 MG tablet Take 1 tablet (12.5 mg total) by mouth 2 (two) times daily with a meal.  60 tablet  3  . cyclobenzaprine (FLEXERIL) 10 MG tablet Take 1 tablet (10 mg total) by mouth 3 (three) times daily as needed for muscle spasms.  30 tablet  0  . HYDROcodone-acetaminophen (NORCO) 5-325 MG per tablet Take 1 tablet by mouth every 6 (six) hours as needed for pain.  30 tablet  0  . ibuprofen (ADVIL,MOTRIN) 200 MG tablet Take 400 mg by mouth every 6 (six) hours as needed. For pain      . lisinopril-hydrochlorothiazide (PRINZIDE,ZESTORETIC) 10-12.5 MG per tablet Take 2   tablets by mouth daily.  30 tablet  3  . lovastatin (MEVACOR) 20 MG tablet Take 1 tablet (20 mg total) by mouth at bedtime.  90 tablet  3  . ranitidine (ZANTAC) 300 MG tablet Take 1 tablet (300 mg total) by mouth at bedtime.  90 tablet  1   No current facility-administered medications on file prior to visit.     REVIEW OF SYSTEMS: Cardiovascular: No chest pain, chest pressure, palpitations, positive for pain in his legs with walking Pulmonary: No productive cough, asthma or wheezing. Positive for shortness of breath Neurologic: No weakness, paresthesias, aphasia, or amaurosis. No dizziness. Hematologic: No bleeding problems or clotting disorders. Musculoskeletal: No joint pain or joint swelling. Gastrointestinal: No blood in stool or hematemesis Genitourinary: No dysuria or hematuria. Psychiatric:: No  history of major depression. Integumentary: No rashes or ulcers. Constitutional: No fever or chills.  PHYSICAL EXAMINATION:   Vital signs are There were no vitals taken for this visit. General: The patient appears their stated age. HEENT:  No gross abnormalities Pulmonary:  Non labored breathing Musculoskeletal: There are no major deformities. Neurologic: No focal weakness or paresthesias are detected, Skin: There are no ulcer or rashes noted. Psychiatric: The patient has normal affect. Cardiovascular: There is a regular rate and rhythm without significant murmur appreciated. No carotid bruits. Pedal pulses are not palpable   Diagnostic Studies Carotid duplex has been reviewed. This shows 80-99% stenosis of the left internal carotid artery. There is irregular soft plaque within the right carotid artery with a proximally 60-79% stenosis.  Assessment: Asymptomatic left carotid stenosis Plan: The patient was scheduled for left carotid endarterectomy. The risks and benefits of the procedure were discussed with the patient. This included the risk of nerve injury, and the risk of stroke. He will continue his aspirin. His right carotid stenosis extended 3 cm into his internal carotid was a very high dissection. I suspect this will be the same Because he has previously had a right carotid endarterectomy I will send him to ENT for preoperative vocal cord evaluation.  Once he is recovered from his carotid endarterectomy he will need further evaluation of his claudication an abdominal aortic aneurysm. I suspect the best way to start his with a CT angiogram of the abdomen and pelvis. This could potentially be done while he is an inpatient.  His blood pressure remained elevated. I have encouraged him to speak with his medical doctor for further treatment of this.   .vqi  V. Wells Bran Aldridge IV, M.D. [facility] [place] Office: [phone] Pager:  [phone]    

## 2013-01-05 ENCOUNTER — Encounter (HOSPITAL_COMMUNITY): Payer: Self-pay | Admitting: Respiratory Therapy

## 2013-01-06 ENCOUNTER — Other Ambulatory Visit: Payer: Self-pay

## 2013-01-07 ENCOUNTER — Encounter (HOSPITAL_COMMUNITY): Payer: Self-pay

## 2013-01-07 ENCOUNTER — Encounter (HOSPITAL_COMMUNITY)
Admission: RE | Admit: 2013-01-07 | Discharge: 2013-01-07 | Disposition: A | Discharge status: Home / Self Care | Payer: BC Managed Care – PPO | Source: Ambulatory Visit | Attending: Surgery | Admitting: Surgery

## 2013-01-07 ENCOUNTER — Ambulatory Visit (HOSPITAL_COMMUNITY)
Admission: RE | Admit: 2013-01-07 | Discharge: 2013-01-07 | Disposition: A | Discharge status: Home / Self Care | Payer: BC Managed Care – PPO | Source: Ambulatory Visit | Attending: Anesthesiology | Admitting: Anesthesiology

## 2013-01-07 DIAGNOSIS — F17200 Nicotine dependence, unspecified, uncomplicated: Secondary | ICD-10-CM | POA: Insufficient documentation

## 2013-01-07 DIAGNOSIS — R911 Solitary pulmonary nodule: Secondary | ICD-10-CM | POA: Insufficient documentation

## 2013-01-07 DIAGNOSIS — Z01818 Encounter for other preprocedural examination: Secondary | ICD-10-CM | POA: Insufficient documentation

## 2013-01-07 DIAGNOSIS — Z951 Presence of aortocoronary bypass graft: Secondary | ICD-10-CM | POA: Insufficient documentation

## 2013-01-07 DIAGNOSIS — Z01812 Encounter for preprocedural laboratory examination: Secondary | ICD-10-CM | POA: Insufficient documentation

## 2013-01-07 HISTORY — DX: Gastro-esophageal reflux disease without esophagitis: K21.9

## 2013-01-07 HISTORY — DX: Anxiety disorder, unspecified: F41.9

## 2013-01-07 LAB — CBC
HCT: 44.1 % (ref 39.0–52.0)
Hemoglobin: 15.6 g/dL (ref 13.0–17.0)
MCH: 31 pg (ref 26.0–34.0)
MCHC: 35.4 g/dL (ref 30.0–36.0)

## 2013-01-07 LAB — TYPE AND SCREEN
ABO/RH(D): O POS
Antibody Screen: NEGATIVE

## 2013-01-07 LAB — COMPREHENSIVE METABOLIC PANEL
BUN: 7 mg/dL (ref 6–23)
Calcium: 9.6 mg/dL (ref 8.4–10.5)
Creatinine, Ser: 0.92 mg/dL (ref 0.50–1.35)
GFR calc Af Amer: >90 mL/min (ref >90)
Glucose, Bld: 95 mg/dL (ref 70–99)
Sodium: 140 mEq/L (ref 135–145)
Total Protein: 7.2 g/dL (ref 6.0–8.3)

## 2013-01-07 LAB — SURGICAL PCR SCREEN: Staphylococcus aureus: NEGATIVE

## 2013-01-07 LAB — PROTIME-INR: Prothrombin Time: 12.5 seconds (ref 11.6–15.2)

## 2013-01-07 NOTE — Pre-Procedure Instructions (Signed)
Randy [name]  01/07/2013   Your procedure is scheduled on:  01-13-2013  Thursday   Report to [facility] at 5:30 AM.    Call this number if you have problems the morning of surgery: [phone]   Remember:   Do not eat food or drink liquids after midnight.    Take these medicines the morning of surgery with A SIP OF WATER: carvedilol (Coreg),omeprazole (Prilosec)   Do not wear jewelry,.  Do not wear lotions, powders, or perfumes.   Do not shave 48 hours prior to surgery. Men may shave face and neck.  Do not bring valuables to the hospital.  [facility] is not responsible  for any belongings or valuables.  Contacts, dentures or bridgework may not be worn into surgery.  Leave suitcase in the car. After surgery it may be brought to your room.   For patients admitted to the hospital, checkout time is 11:00 AM the day of discharge.   Patients discharged the day of surgery will not be allowed to drive home.    Special Instructions: Shower using CHG 2 nights before surgery and the night before surgery.  If you shower the day of surgery use CHG.  Use special wash - you have one bottle of CHG for all showers.  You should use approximately 1/3 of the bottle for each shower.   Please read over the following fact sheets that you were given: Pain Booklet, Blood Transfusion Information and Surgical Site Infection Prevention

## 2013-01-12 MED ORDER — DEXTROSE 5 % IV SOLN
1.5000 g | INTRAVENOUS | Status: AC
  Administered 2013-01-13: 1.5 g via INTRAVENOUS
  Filled 2013-01-12: qty 1.5

## 2013-01-13 ENCOUNTER — Encounter (HOSPITAL_COMMUNITY)
Admission: RE | Disposition: A | Discharge status: Home / Self Care | Payer: Self-pay | Source: Ambulatory Visit | Attending: Surgery

## 2013-01-13 ENCOUNTER — Encounter (HOSPITAL_COMMUNITY): Payer: Self-pay | Admitting: Anesthesiology

## 2013-01-13 ENCOUNTER — Inpatient Hospital Stay (HOSPITAL_COMMUNITY): Payer: BC Managed Care – PPO

## 2013-01-13 ENCOUNTER — Inpatient Hospital Stay (HOSPITAL_COMMUNITY): Payer: BC Managed Care – PPO | Admitting: Anesthesiology

## 2013-01-13 ENCOUNTER — Inpatient Hospital Stay (HOSPITAL_COMMUNITY)
Admission: RE | Admit: 2013-01-13 | Discharge: 2013-01-14 | DRG: 015 | Disposition: A | Discharge status: Home / Self Care | Payer: BC Managed Care – PPO | Source: Ambulatory Visit | Attending: Surgery | Admitting: Surgery

## 2013-01-13 ENCOUNTER — Encounter (HOSPITAL_COMMUNITY): Payer: Self-pay | Admitting: *Deleted

## 2013-01-13 DIAGNOSIS — I2510 Atherosclerotic heart disease of native coronary artery without angina pectoris: Secondary | ICD-10-CM | POA: Diagnosis present

## 2013-01-13 DIAGNOSIS — Z79899 Other long term (current) drug therapy: Secondary | ICD-10-CM

## 2013-01-13 DIAGNOSIS — F411 Generalized anxiety disorder: Secondary | ICD-10-CM | POA: Diagnosis present

## 2013-01-13 DIAGNOSIS — I252 Old myocardial infarction: Secondary | ICD-10-CM

## 2013-01-13 DIAGNOSIS — E7800 Pure hypercholesterolemia, unspecified: Secondary | ICD-10-CM | POA: Diagnosis present

## 2013-01-13 DIAGNOSIS — Z7982 Long term (current) use of aspirin: Secondary | ICD-10-CM

## 2013-01-13 DIAGNOSIS — I499 Cardiac arrhythmia, unspecified: Secondary | ICD-10-CM | POA: Diagnosis present

## 2013-01-13 DIAGNOSIS — F1910 Other psychoactive substance abuse, uncomplicated: Secondary | ICD-10-CM | POA: Diagnosis present

## 2013-01-13 DIAGNOSIS — I10 Essential (primary) hypertension: Secondary | ICD-10-CM | POA: Diagnosis present

## 2013-01-13 DIAGNOSIS — I6529 Occlusion and stenosis of unspecified carotid artery: Principal | ICD-10-CM | POA: Diagnosis present

## 2013-01-13 DIAGNOSIS — Z538 Procedure and treatment not carried out for other reasons: Secondary | ICD-10-CM

## 2013-01-13 DIAGNOSIS — E785 Hyperlipidemia, unspecified: Secondary | ICD-10-CM | POA: Diagnosis present

## 2013-01-13 DIAGNOSIS — K219 Gastro-esophageal reflux disease without esophagitis: Secondary | ICD-10-CM | POA: Diagnosis present

## 2013-01-13 DIAGNOSIS — F17200 Nicotine dependence, unspecified, uncomplicated: Secondary | ICD-10-CM | POA: Diagnosis present

## 2013-01-13 DIAGNOSIS — Z951 Presence of aortocoronary bypass graft: Secondary | ICD-10-CM

## 2013-01-13 HISTORY — PX: ENDARTERECTOMY: SHX5162

## 2013-01-13 HISTORY — PX: CAROTID ENDARTERECTOMY: SUR193

## 2013-01-13 LAB — URINALYSIS, ROUTINE W REFLEX MICROSCOPIC
Bilirubin Urine: NEGATIVE
Hgb urine dipstick: NEGATIVE
Protein, ur: NEGATIVE mg/dL
Urobilinogen, UA: 1 mg/dL (ref 0.0–1.0)

## 2013-01-13 SURGERY — ENDARTERECTOMY, CAROTID
Anesthesia: General | Site: Neck | Laterality: Left | Wound class: Clean

## 2013-01-13 MED ORDER — FENTANYL CITRATE 0.05 MG/ML IJ SOLN
INTRAMUSCULAR | Status: DC | PRN
  Administered 2013-01-13: 100 ug via INTRAVENOUS

## 2013-01-13 MED ORDER — HYDROMORPHONE HCL PF 1 MG/ML IJ SOLN: 0.2500 mg | INTRAMUSCULAR | Status: DC | PRN

## 2013-01-13 MED ORDER — POTASSIUM CHLORIDE CRYS ER 20 MEQ PO TBCR: 20.0000 meq | EXTENDED_RELEASE_TABLET | Freq: Once | ORAL | Status: AC | PRN

## 2013-01-13 MED ORDER — SODIUM CHLORIDE 0.9 % IV SOLN
INTRAVENOUS | Status: DC
  Administered 2013-01-13: 20:00:00 via INTRAVENOUS
  Administered 2013-01-13: 125 mL/h via INTRAVENOUS

## 2013-01-13 MED ORDER — LIDOCAINE HCL (PF) 1 % IJ SOLN
INTRAMUSCULAR | Status: AC
  Filled 2013-01-13: qty 30

## 2013-01-13 MED ORDER — ALBUTEROL SULFATE HFA 108 (90 BASE) MCG/ACT IN AERS
INHALATION_SPRAY | RESPIRATORY_TRACT | Status: DC | PRN
  Administered 2013-01-13: 4 via RESPIRATORY_TRACT

## 2013-01-13 MED ORDER — GLYCOPYRROLATE 0.2 MG/ML IJ SOLN
INTRAMUSCULAR | Status: DC | PRN
  Administered 2013-01-13: 0.4 mg via INTRAVENOUS

## 2013-01-13 MED ORDER — SODIUM CHLORIDE 0.9 % IR SOLN
Status: DC | PRN
  Administered 2013-01-13: 07:00:00

## 2013-01-13 MED ORDER — PROMETHAZINE HCL 25 MG/ML IJ SOLN
INTRAMUSCULAR | Status: AC
  Filled 2013-01-13: qty 1

## 2013-01-13 MED ORDER — ACETAMINOPHEN 325 MG PO TABS: 325.0000 mg | ORAL_TABLET | ORAL | Status: DC | PRN

## 2013-01-13 MED ORDER — MORPHINE SULFATE 2 MG/ML IJ SOLN: 2.0000 mg | INTRAMUSCULAR | Status: DC | PRN

## 2013-01-13 MED ORDER — CARVEDILOL 12.5 MG PO TABS
12.5000 mg | ORAL_TABLET | Freq: Once | ORAL | Status: AC
  Administered 2013-01-13: 12.5 mg via ORAL
  Filled 2013-01-13 (×2): qty 1

## 2013-01-13 MED ORDER — ALUM & MAG HYDROXIDE-SIMETH 200-200-20 MG/5ML PO SUSP
15.0000 mL | ORAL | Status: DC | PRN
  Administered 2013-01-14: 30 mL via ORAL
  Filled 2013-01-13: qty 30

## 2013-01-13 MED ORDER — 0.9 % SODIUM CHLORIDE (POUR BTL) OPTIME
TOPICAL | Status: DC | PRN
  Administered 2013-01-13: 1000 mL

## 2013-01-13 MED ORDER — LIDOCAINE HCL 4 % MT SOLN
OROMUCOSAL | Status: DC | PRN
  Administered 2013-01-13: 4 mL via TOPICAL

## 2013-01-13 MED ORDER — METOPROLOL TARTRATE 1 MG/ML IV SOLN: 2.0000 mg | INTRAVENOUS | Status: DC | PRN

## 2013-01-13 MED ORDER — FAMOTIDINE IN NACL 20-0.9 MG/50ML-% IV SOLN
20.0000 mg | Freq: Two times a day (BID) | INTRAVENOUS | Status: DC
  Administered 2013-01-13: 20 mg via INTRAVENOUS
  Filled 2013-01-13 (×3): qty 50

## 2013-01-13 MED ORDER — CARVEDILOL 12.5 MG PO TABS
12.5000 mg | ORAL_TABLET | Freq: Two times a day (BID) | ORAL | Status: DC
  Administered 2013-01-13 – 2013-01-14 (×2): 12.5 mg via ORAL
  Filled 2013-01-13 (×4): qty 1

## 2013-01-13 MED ORDER — LISINOPRIL 20 MG PO TABS
20.0000 mg | ORAL_TABLET | Freq: Every day | ORAL | Status: DC
  Administered 2013-01-13: 20 mg via ORAL
  Filled 2013-01-13 (×2): qty 1

## 2013-01-13 MED ORDER — ACETAMINOPHEN 650 MG RE SUPP: 325.0000 mg | RECTAL | Status: DC | PRN

## 2013-01-13 MED ORDER — DOPAMINE-DEXTROSE 3.2-5 MG/ML-% IV SOLN: 3.0000 ug/kg/min | INTRAVENOUS | Status: DC

## 2013-01-13 MED ORDER — MAGNESIUM SULFATE 40 MG/ML IJ SOLN
2.0000 g | Freq: Once | INTRAMUSCULAR | Status: AC | PRN
  Filled 2013-01-13: qty 50

## 2013-01-13 MED ORDER — DEXTROSE 5 % IV SOLN
1.5000 g | Freq: Two times a day (BID) | INTRAVENOUS | Status: AC
  Administered 2013-01-13 – 2013-01-14 (×2): 1.5 g via INTRAVENOUS
  Filled 2013-01-13 (×2): qty 1.5

## 2013-01-13 MED ORDER — PROPOFOL 10 MG/ML IV BOLUS
INTRAVENOUS | Status: DC | PRN
  Administered 2013-01-13: 200 mg via INTRAVENOUS

## 2013-01-13 MED ORDER — GUAIFENESIN-DM 100-10 MG/5ML PO SYRP: 15.0000 mL | ORAL_SOLUTION | ORAL | Status: DC | PRN

## 2013-01-13 MED ORDER — PROMETHAZINE HCL 25 MG/ML IJ SOLN: 6.2500 mg | INTRAMUSCULAR | Status: DC | PRN

## 2013-01-13 MED ORDER — PHENOL 1.4 % MT LIQD: 1.0000 | OROMUCOSAL | Status: DC | PRN

## 2013-01-13 MED ORDER — ASPIRIN 81 MG PO CHEW
81.0000 mg | CHEWABLE_TABLET | Freq: Every day | ORAL | Status: DC
  Administered 2013-01-13: 81 mg via ORAL
  Filled 2013-01-13 (×2): qty 1

## 2013-01-13 MED ORDER — ROCURONIUM BROMIDE 100 MG/10ML IV SOLN
INTRAVENOUS | Status: DC | PRN
  Administered 2013-01-13: 50 mg via INTRAVENOUS

## 2013-01-13 MED ORDER — IOHEXOL 350 MG/ML SOLN
100.0000 mL | Freq: Once | INTRAVENOUS | Status: AC | PRN
  Administered 2013-01-13: 100 mL via INTRAVENOUS

## 2013-01-13 MED ORDER — SODIUM CHLORIDE 0.9 % IV SOLN: INTRAVENOUS | Status: DC

## 2013-01-13 MED ORDER — SIMVASTATIN 10 MG PO TABS
10.0000 mg | ORAL_TABLET | Freq: Every day | ORAL | Status: DC
  Administered 2013-01-13: 10 mg via ORAL
  Filled 2013-01-13 (×2): qty 1

## 2013-01-13 MED ORDER — LABETALOL HCL 5 MG/ML IV SOLN: 10.0000 mg | INTRAVENOUS | Status: DC | PRN

## 2013-01-13 MED ORDER — OXYCODONE-ACETAMINOPHEN 5-325 MG PO TABS: 1.0000 | ORAL_TABLET | ORAL | Status: DC | PRN

## 2013-01-13 MED ORDER — LISINOPRIL-HYDROCHLOROTHIAZIDE 10-12.5 MG PO TABS: 2.0000 | ORAL_TABLET | Freq: Every day | ORAL | Status: DC

## 2013-01-13 MED ORDER — SODIUM CHLORIDE 0.9 % IV SOLN: 500.0000 mL | Freq: Once | INTRAVENOUS | Status: AC | PRN

## 2013-01-13 MED ORDER — NEOSTIGMINE METHYLSULFATE 1 MG/ML IJ SOLN
INTRAMUSCULAR | Status: DC | PRN
  Administered 2013-01-13: 3 mg via INTRAVENOUS

## 2013-01-13 MED ORDER — OXYCODONE HCL 5 MG PO TABS: 5.0000 mg | ORAL_TABLET | Freq: Once | ORAL | Status: DC | PRN

## 2013-01-13 MED ORDER — OMEPRAZOLE MAGNESIUM 20 MG PO TBEC: 20.0000 mg | DELAYED_RELEASE_TABLET | Freq: Every day | ORAL | Status: DC

## 2013-01-13 MED ORDER — HYDRALAZINE HCL 20 MG/ML IJ SOLN: 10.0000 mg | INTRAMUSCULAR | Status: DC | PRN

## 2013-01-13 MED ORDER — OXYCODONE HCL 5 MG/5ML PO SOLN: 5.0000 mg | Freq: Once | ORAL | Status: DC | PRN

## 2013-01-13 MED ORDER — EPHEDRINE SULFATE 50 MG/ML IJ SOLN
INTRAMUSCULAR | Status: DC | PRN
  Administered 2013-01-13: 10 mg via INTRAVENOUS
  Administered 2013-01-13: 5 mg via INTRAVENOUS

## 2013-01-13 MED ORDER — ONDANSETRON HCL 4 MG/2ML IJ SOLN
INTRAMUSCULAR | Status: DC | PRN
  Administered 2013-01-13: 4 mg via INTRAVENOUS

## 2013-01-13 MED ORDER — THROMBIN 20000 UNITS EX SOLR
CUTANEOUS | Status: AC
  Filled 2013-01-13: qty 20000

## 2013-01-13 MED ORDER — MIDAZOLAM HCL 5 MG/5ML IJ SOLN
INTRAMUSCULAR | Status: DC | PRN
  Administered 2013-01-13: 2 mg via INTRAVENOUS

## 2013-01-13 MED ORDER — PANTOPRAZOLE SODIUM 20 MG PO TBEC
20.0000 mg | DELAYED_RELEASE_TABLET | Freq: Every day | ORAL | Status: DC
  Administered 2013-01-13: 20 mg via ORAL
  Filled 2013-01-13 (×2): qty 1

## 2013-01-13 MED ORDER — HYDROCHLOROTHIAZIDE 25 MG PO TABS
25.0000 mg | ORAL_TABLET | Freq: Every day | ORAL | Status: DC
  Administered 2013-01-13: 25 mg via ORAL
  Filled 2013-01-13 (×2): qty 1

## 2013-01-13 MED ORDER — DOCUSATE SODIUM 100 MG PO CAPS: 100.0000 mg | ORAL_CAPSULE | Freq: Every day | ORAL | Status: DC

## 2013-01-13 MED ORDER — PHENYLEPHRINE HCL 10 MG/ML IJ SOLN
10.0000 mg | INTRAVENOUS | Status: DC | PRN
  Administered 2013-01-13: 20 ug/min via INTRAVENOUS

## 2013-01-13 MED ORDER — LACTATED RINGERS IV SOLN
INTRAVENOUS | Status: DC | PRN
  Administered 2013-01-13: 07:00:00 via INTRAVENOUS

## 2013-01-13 MED ORDER — ONDANSETRON HCL 4 MG/2ML IJ SOLN: 4.0000 mg | Freq: Four times a day (QID) | INTRAMUSCULAR | Status: DC | PRN

## 2013-01-13 SURGICAL SUPPLY — 52 items
ADH SKN CLS APL DERMABOND .7 (GAUZE/BANDAGES/DRESSINGS) ×1
CANISTER SUCTION 2500CC (MISCELLANEOUS) ×2 IMPLANT
CATH ROBINSON RED A/P 18FR (CATHETERS) ×2 IMPLANT
CATH SUCT 10FR WHISTLE TIP (CATHETERS) ×2 IMPLANT
CLIP TI MEDIUM 24 (CLIP) ×2 IMPLANT
CLIP TI WIDE RED SMALL 24 (CLIP) ×2 IMPLANT
CLOTH BEACON ORANGE TIMEOUT ST (SAFETY) ×2 IMPLANT
COVER SURGICAL LIGHT HANDLE (MISCELLANEOUS) ×2 IMPLANT
CRADLE DONUT ADULT HEAD (MISCELLANEOUS) ×2 IMPLANT
DERMABOND ADVANCED (GAUZE/BANDAGES/DRESSINGS) ×1
DERMABOND ADVANCED .7 DNX12 (GAUZE/BANDAGES/DRESSINGS) ×1 IMPLANT
DRAIN CHANNEL 15F RND FF W/TCR (WOUND CARE) IMPLANT
DRAPE WARM FLUID 44X44 (DRAPE) ×2 IMPLANT
ELECT REM PT RETURN 9FT ADLT (ELECTROSURGICAL) ×2
ELECTRODE REM PT RTRN 9FT ADLT (ELECTROSURGICAL) ×1 IMPLANT
EVACUATOR SILICONE 100CC (DRAIN) IMPLANT
GLOVE BIO SURGEON STRL SZ7.5 (GLOVE) ×1 IMPLANT
GLOVE BIOGEL PI IND STRL 7.0 (GLOVE) IMPLANT
GLOVE BIOGEL PI IND STRL 7.5 (GLOVE) ×1 IMPLANT
GLOVE BIOGEL PI INDICATOR 7.0 (GLOVE) ×1
GLOVE BIOGEL PI INDICATOR 7.5 (GLOVE) ×4
GLOVE SS BIOGEL STRL SZ 7.5 (GLOVE) IMPLANT
GLOVE SUPERSENSE BIOGEL SZ 7.5 (GLOVE) ×1
GLOVE SURG SS PI 7.0 STRL IVOR (GLOVE) ×2 IMPLANT
GLOVE SURG SS PI 7.5 STRL IVOR (GLOVE) ×3 IMPLANT
GOWN PREVENTION PLUS XXLARGE (GOWN DISPOSABLE) ×3 IMPLANT
GOWN STRL NON-REIN LRG LVL3 (GOWN DISPOSABLE) ×4 IMPLANT
GOWN STRL REIN XL XLG (GOWN DISPOSABLE) ×2 IMPLANT
HEMOSTAT SNOW SURGICEL 2X4 (HEMOSTASIS) IMPLANT
INSERT FOGARTY SM (MISCELLANEOUS) IMPLANT
KIT BASIN OR (CUSTOM PROCEDURE TRAY) ×2 IMPLANT
KIT ROOM TURNOVER OR (KITS) ×2 IMPLANT
NS IRRIG 1000ML POUR BTL (IV SOLUTION) ×4 IMPLANT
PACK CAROTID (CUSTOM PROCEDURE TRAY) ×2 IMPLANT
PAD ARMBOARD 7.5X6 YLW CONV (MISCELLANEOUS) ×4 IMPLANT
PATCH VASCULAR VASCU GUARD 1X6 (Vascular Products) IMPLANT
SHUNT CAROTID BYPASS 10 (VASCULAR PRODUCTS) IMPLANT
SHUNT CAROTID BYPASS 12FRX15.5 (VASCULAR PRODUCTS) IMPLANT
SPONGE INTESTINAL PEANUT (DISPOSABLE) ×3 IMPLANT
SUT ETHILON 3 0 PS 1 (SUTURE) IMPLANT
SUT PROLENE 6 0 BV (SUTURE) ×3 IMPLANT
SUT PROLENE 7 0 BV 1 (SUTURE) IMPLANT
SUT SILK 3 0 (SUTURE) ×2
SUT SILK 3 0 TIES 17X18 (SUTURE)
SUT SILK 3-0 18XBRD TIE 12 (SUTURE) IMPLANT
SUT SILK 3-0 18XBRD TIE BLK (SUTURE) IMPLANT
SUT VIC AB 3-0 SH 27 (SUTURE) ×4
SUT VIC AB 3-0 SH 27X BRD (SUTURE) ×2 IMPLANT
SUT VICRYL 4-0 PS2 18IN ABS (SUTURE) ×2 IMPLANT
TOWEL OR 17X24 6PK STRL BLUE (TOWEL DISPOSABLE) ×2 IMPLANT
TOWEL OR 17X26 10 PK STRL BLUE (TOWEL DISPOSABLE) ×2 IMPLANT
WATER STERILE IRR 1000ML POUR (IV SOLUTION) ×2 IMPLANT

## 2013-01-13 NOTE — Interval H&P Note (Signed)
History and Physical Interval Note:  01/13/2013 7:13 AM  Randy [name]  has presented today for surgery, with the diagnosis of Left Internal Carotid Artery Stenosis  The various methods of treatment have been discussed with the patient and family. After consideration of risks, benefits and other options for treatment, the patient has consented to  Procedure(s): ENDARTERECTOMY CAROTID (Left) as a surgical intervention .  The patient's history has been reviewed, patient examined, no change in status, stable for surgery.  I have reviewed the patient's chart and labs.  Questions were answered to the patient's satisfaction.     Xan Sparkman IV, V. WELLS

## 2013-01-13 NOTE — H&P (View-Only) (Signed)
[facility]   Patient name: Randy [name] MRN: [id number] DOB: May 03, 1961 Sex: male    No chief complaint on file.   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 On surveillance ultrasound he was found to have greater than 80% left carotid stenosis. He remained asymptomatic. Specifically, he denies numbness or weakness in either extremity. He denies slurred speech. He denies amaurosis fugax. The patient has a history of right carotid endarterectomy in 2010 which was done in conjunction with coronary artery bypass grafting.  The patient was found to have a dominant aortic aneurysm in July of 2013 on CT scan. He also suffers from claudication. CT scan suggested iliac occlusive disease. He states he cannot walk approximately one block before he has to stop because of leg pain.  The patient is managed for his hypercholesterolemia with a statin. He is on 2 medications for hypertension, however his blood pressure continues to run on the high side.  Past Medical History  Diagnosis Date  . Hypertension   . CAD (coronary artery disease)   . Hx of CABG 2010  . PVD (peripheral vascular disease)   . Polysubstance abuse   . Abnormal heart rhythm   . Heart attack 2010  . Hyperlipidemia   . Lung nodule   . Irregular heartbeat   . AAA (abdominal aortic aneurysm) July 2013    Past Surgical History  Procedure Laterality Date  . Coronary artery bypass graft  2010  . Carotid endarterectomy      History   Social History  . Marital Status: Widowed    Spouse Name: N/A    Number of Children: Y  . Years of Education: N/A   Occupational History  . not working.     Social History Main Topics  . Smoking status: Current Every Day Smoker -- 0.25 packs/day for 30 years    Types: Cigarettes  . Smokeless tobacco: Not on file  . Alcohol Use: Yes     Comment: 6/wk  . Drug Use: No  . Sexually Active: Not on file   Other  Topics Concern  . Not on file   Social History Narrative   Pt is widowed and lives alone.          Family History  Problem Relation Age of Onset  . Asthma Mother   . Diabetes Mother   . Hyperlipidemia Mother   . Hypertension Mother   . Lung cancer Father   . Cancer Father   . Stroke Sister     Allergies as of 01/03/2013  . (No Known Allergies)    Current Outpatient Prescriptions on File Prior to Visit  Medication Sig Dispense Refill  . aspirin 81 MG tablet Take 81 mg by mouth daily.       . carvedilol (COREG) 12.5 MG tablet Take 1 tablet (12.5 mg total) by mouth 2 (two) times daily with a meal.  60 tablet  3  . cyclobenzaprine (FLEXERIL) 10 MG tablet Take 1 tablet (10 mg total) by mouth 3 (three) times daily as needed for muscle spasms.  30 tablet  0  . HYDROcodone-acetaminophen (NORCO) 5-325 MG per tablet Take 1 tablet by mouth every 6 (six) hours as needed for pain.  30 tablet  0  . ibuprofen (ADVIL,MOTRIN) 200 MG tablet Take 400 mg by mouth every 6 (six) hours as needed. For pain      . lisinopril-hydrochlorothiazide (PRINZIDE,ZESTORETIC) 10-12.5 MG per tablet Take 2  tablets by mouth daily.  30 tablet  3  . lovastatin (MEVACOR) 20 MG tablet Take 1 tablet (20 mg total) by mouth at bedtime.  90 tablet  3  . ranitidine (ZANTAC) 300 MG tablet Take 1 tablet (300 mg total) by mouth at bedtime.  90 tablet  1   No current facility-administered medications on file prior to visit.     REVIEW OF SYSTEMS: Cardiovascular: No chest pain, chest pressure, palpitations, positive for pain in his legs with walking Pulmonary: No productive cough, asthma or wheezing. Positive for shortness of breath Neurologic: No weakness, paresthesias, aphasia, or amaurosis. No dizziness. Hematologic: No bleeding problems or clotting disorders. Musculoskeletal: No joint pain or joint swelling. Gastrointestinal: No blood in stool or hematemesis Genitourinary: No dysuria or hematuria. Psychiatric:: No  history of major depression. Integumentary: No rashes or ulcers. Constitutional: No fever or chills.  PHYSICAL EXAMINATION:   Vital signs are There were no vitals taken for this visit. General: The patient appears their stated age. HEENT:  No gross abnormalities Pulmonary:  Non labored breathing Musculoskeletal: There are no major deformities. Neurologic: No focal weakness or paresthesias are detected, Skin: There are no ulcer or rashes noted. Psychiatric: The patient has normal affect. Cardiovascular: There is a regular rate and rhythm without significant murmur appreciated. No carotid bruits. Pedal pulses are not palpable   Diagnostic Studies Carotid duplex has been reviewed. This shows 80-99% stenosis of the left internal carotid artery. There is irregular soft plaque within the right carotid artery with a proximally 60-79% stenosis.  Assessment: Asymptomatic left carotid stenosis Plan: The patient was scheduled for left carotid endarterectomy. The risks and benefits of the procedure were discussed with the patient. This included the risk of nerve injury, and the risk of stroke. He will continue his aspirin. His right carotid stenosis extended 3 cm into his internal carotid was a very high dissection. I suspect this will be the same Because he has previously had a right carotid endarterectomy I will send him to ENT for preoperative vocal cord evaluation.  Once he is recovered from his carotid endarterectomy he will need further evaluation of his claudication an abdominal aortic aneurysm. I suspect the best way to start his with a CT angiogram of the abdomen and pelvis. This could potentially be done while he is an inpatient.  His blood pressure remained elevated. I have encouraged him to speak with his medical doctor for further treatment of this.   Marland Kitchenvqi  V. Charlena Cross, M.D. [facility] [place] Office: [phone] Pager:  [phone]

## 2013-01-13 NOTE — Anesthesia Postprocedure Evaluation (Signed)
Anesthesia Post Note  Patient: Randy [name]  Procedure(s) Performed: Procedure(s) (LRB): ATTEMPTED ENDARTERECTOMY CAROTID (Left)  Anesthesia type: general  Patient location: PACU  Post pain: Pain level controlled  Post assessment: Patient's Cardiovascular Status Stable  Last Vitals:  Filed Vitals:   01/13/13 1005  BP: 160/66  Pulse: 66  Temp:   Resp: 13    Post vital signs: Reviewed and stable  Level of consciousness: sedated  Complications: No apparent anesthesia complications

## 2013-01-13 NOTE — Anesthesia Preprocedure Evaluation (Addendum)
Anesthesia Evaluation  Patient identified by MRN, date of birth, ID band Patient awake    Reviewed: Allergy & Precautions, H&P , NPO status , Patient's Chart, lab work & pertinent test results  History of Anesthesia Complications Negative for: history of anesthetic complications  Airway Mallampati: II TM Distance: >3 FB Neck ROM: Full    Dental  (+) Dental Advisory Given and Poor Dentition   Pulmonary shortness of breath and lying, Current Smoker,    Pulmonary exam normal       Cardiovascular hypertension, Pt. 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   Neuro/Psych Anxiety negative neurological ROS     GI/Hepatic GERD-  Medicated,(+)     substance abuse   ,   Endo/Other    Renal/GU      Musculoskeletal   Abdominal   Peds  Hematology   Anesthesia Other Findings   Reproductive/Obstetrics                       Anesthesia Physical Anesthesia Plan  ASA: III  Anesthesia Plan: General   Post-op Pain Management:    Induction: Intravenous  Airway Management Planned: Oral ETT  Additional Equipment: Arterial line  Intra-op Plan:   Post-operative Plan: Extubation in OR  Informed Consent: I have reviewed the patients History and Physical, chart, labs and discussed the procedure including the risks, benefits and alternatives for the proposed anesthesia with the patient or authorized representative who has indicated his/her understanding and acceptance.   Dental advisory given  Plan Discussed with: CRNA, Anesthesiologist and Surgeon  Anesthesia Plan Comments:        Anesthesia Quick Evaluation

## 2013-01-13 NOTE — Preoperative (Signed)
Beta Blockers   Reason not to administer Beta Blockers:Not Applicable 

## 2013-01-13 NOTE — Transfer of Care (Signed)
Immediate Anesthesia Transfer of Care Note  Patient: Randy [name]  Procedure(s) Performed: Procedure(s): ATTEMPTED ENDARTERECTOMY CAROTID (Left)  Patient Location: PACU  Anesthesia Type:General  Level of Consciousness: awake, alert  and oriented  Airway & Oxygen Therapy: Patient Spontanous Breathing and Patient connected to nasal cannula oxygen  Post-op Assessment: Report given to PACU RN and Post -op Vital signs reviewed and stable  Post vital signs: Reviewed and stable  Complications: No apparent anesthesia complications

## 2013-01-13 NOTE — Op Note (Signed)
[facility]  Patient name: Randy [name] MRN: [id number] DOB: 1960/10/18 Sex: male  01/13/2013 Pre-operative Diagnosis: Asymptomatic left carotid stenosis Post-operative diagnosis:  Same Surgeon:  [name] Assistants:  Narda Amber Procedure:   1:  Exposure of left carotid artery   2:  Aborted left carotid endarterectomy secondary to calcification extending to the skull base Anesthesia:  Gen.  Blood Loss:  See anesthesia record Specimens:  None  Findings:  The patient's internal carotid artery was calcified and had plaque extending up to the skull base. I did not feel that it was safe to open the artery because of the amount of plaque present and because I could not get beyond the plaque despite full exposure.  Indications:  The patient is previously undergone right carotid endarterectomy. He has been followed with ultrasound and has not progressed to greater than 80% stenosis on the left. He comes in today for elective carotid endarterectomy.  Procedure:  The patient was identified in the holding area and taken to [facility] OR ROOM 11  The patient was then placed supine on the table. general anesthesia was administered.  The patient was prepped and draped in the usual sterile fashion.  A time out was called and antibiotics were administered.  An incision was made along the medial border of the sternocleidomastoid. Cautery was used to divide the subcutaneous tissue. The platysma muscle was divided with cautery. I then exposed the internal jugular vein. The common facial vein was dissected free and then ligated between 2-0 silk ties and metal clips. I then proceeded with dissection of the common carotid artery. The vagus nerve was identified on the posterior medial border and protected. The common carotid artery was then encircled with a umbilical tape. I then proceeded with cephalad dissection. The superior thyroid artery was identified and encircled with a 2-0 silk  tie. I then dissected out the external carotid artery. The hypoglossal nerve was identified. I then proceeded with dissection of the internal carotid artery. The plaque was very dense at the carotid bifurcation and then extended cephalad. I had to completely mobilize the hypoglossal nerve. This included ligation of the occipital artery. I was ultimately able to dissect out the internal carotid artery up to the styloid process. Even at this level there was significant calcification of the artery as well as plaque which could be visualized externally. I consulted several of my partners and after much deliberation, I did not feel that it was safe to open the artery because I did not feel like to get a distal endpoint. I did listen to the distal internal carotid artery with a hand-held Doppler and there was a good signal. With this I elected to terminate the procedure. The wound was irrigated. After hemostasis was satisfactory the carotid sheath was closed with a running 3-0 Vicryl. The platysma muscle was closed with a running 3-0 Vicryl and the skin was closed with a 4-0 Vicryl. Dermabond was then applied.   Disposition:  To PACU in stable condition.   Juleen China, M.D. [facility] [place] Office: [phone] Pager:  [phone]

## 2013-01-13 NOTE — Progress Notes (Signed)
Utilization review completed.  

## 2013-01-14 LAB — CBC
MCH: 29.9 pg (ref 26.0–34.0)
Platelets: 176 10*3/uL (ref 150–400)
RBC: 4.79 MIL/uL (ref 4.22–5.81)
RDW: 14.4 % (ref 11.5–15.5)

## 2013-01-14 LAB — BASIC METABOLIC PANEL
CO2: 27 mEq/L (ref 19–32)
Calcium: 8.3 mg/dL — ABNORMAL LOW (ref 8.4–10.5)
Creatinine, Ser: 0.9 mg/dL (ref 0.50–1.35)
GFR calc Af Amer: >90 mL/min (ref >90)
GFR calc non Af Amer: >90 mL/min (ref >90)
Sodium: 133 mEq/L — ABNORMAL LOW (ref 135–145)

## 2013-01-14 MED ORDER — OXYCODONE-ACETAMINOPHEN 5-325 MG PO TABS: 1.0000 | ORAL_TABLET | ORAL | Status: DC | PRN

## 2013-01-14 NOTE — Discharge Summary (Signed)
Physician Discharge Summary   Patient ID: Randy [name] MRN: [id number] DOB/AGE: 54-01-1961 54 y.o.   Admit date: 01/13/2013 Discharge date: 01/14/2013   Admission Diagnosis: Left carotid stenosis  Discharge Diagnoses:  Left carotid stenosis  Secondary Diagnoses: Past Medical History  Diagnosis Date  . Hypertension   . CAD (coronary artery disease)   . Hx of CABG 2010  . Polysubstance abuse   . Abnormal heart rhythm   . Heart attack 2010  . Hyperlipidemia   . Lung nodule   . Irregular heartbeat   . Shortness of breath     when lying flat on back  . PVD (peripheral vascular disease)   . AAA (abdominal aortic aneurysm) July 2013  . Carotid artery occlusion   . GERD (gastroesophageal reflux disease)   . Anxiety      Procedures: 1.  left carotid endarterectomy was not performed due to length of stenosis and calcification extending to the skull base   (01/13/2013)   Discharged Condition: good   Hospital Course:  Randy [name] is a 52 y.o. male with left asymptomatic internal carotid stenosis >80%.  Aborted left carotid endarterectomy secondary to calcification extending to the skull base  The patient post-operative course was:  unremarkable with intact neurologic function and stable hemodynamics.  The patient's incision had:  no hematoma.  The patient was  tolerating food by mouth on discharge.  The patient will follow up with Dr. Myra Gianotti in 2 weeks for wound check.   Consults:  none   Significant Diagnostic Studies: CBC    Component Value Date/Time   WBC 10.2 01/14/2013 0433   RBC 4.79 01/14/2013 0433   HGB 14.3 01/14/2013 0433   HCT 42.3 01/14/2013 0433   PLT 176 01/14/2013 0433   MCV 88.3 01/14/2013 0433   MCH 29.9 01/14/2013 0433   MCHC 33.8 01/14/2013 0433   RDW 14.4 01/14/2013 0433   LYMPHSABS 3.4 03/19/2011 0455   MONOABS 0.5 03/19/2011 0455   EOSABS 0.3 03/19/2011 0455   BASOSABS 0.1 03/19/2011 0455    BMET    Component Value Date/Time   NA 133*  01/14/2013 0433   K 3.8 01/14/2013 0433   CL 99 01/14/2013 0433   CO2 27 01/14/2013 0433   GLUCOSE 115* 01/14/2013 0433   BUN 10 01/14/2013 0433   CREATININE 0.90 01/14/2013 0433   CREATININE 1.00 02/10/2012 1654   CALCIUM 8.3* 01/14/2013 0433   GFRNONAA >90 01/14/2013 0433   GFRAA >90 01/14/2013 0433    COAG Lab Results  Component Value Date   INR 0.95 01/07/2013   INR 1.06 12/13/2009   INR 1.4 10/11/2008   No results found for this basename: PTT     Disposition: 01-Home or Self Care  Discharge Orders   Future Orders Complete By Expires     Call MD for:  redness, tenderness, or signs of infection (pain, swelling, bleeding, redness, odor or green/yellow discharge around incision site)  As directed     Call MD for:  severe or increased pain, loss or decreased feeling  in affected limb(s)  As directed     Call MD for:  temperature >100.5  As directed     Driving Restrictions  As directed     Comments:      No driving for 4 weeks    Increase activity slowly  As directed     Comments:      Walk with assistance use walker or cane as needed    Lifting  restrictions  As directed     Comments:      No lifting for 6 weeks    May shower   As directed     Resume previous diet  As directed          Medication List         aspirin 81 MG tablet  Take 81 mg by mouth daily.     carvedilol 12.5 MG tablet  Commonly known as:  COREG  Take 1 tablet (12.5 mg total) by mouth 2 (two) times daily with a meal.     lisinopril-hydrochlorothiazide 10-12.5 MG per tablet  Commonly known as:  PRINZIDE,ZESTORETIC  Take 2 tablets by mouth daily.     lovastatin 20 MG tablet  Commonly known as:  MEVACOR  Take 1 tablet (20 mg total) by mouth at bedtime.     omeprazole 20 MG tablet  Commonly known as:  PRILOSEC OTC  Take 20 mg by mouth daily.     oxyCODONE-acetaminophen 5-325 MG per tablet  Commonly known as:  PERCOCET/ROXICET  Take 1-2 tablets by mouth every 4 (four) hours as needed.      ranitidine 300 MG tablet  Commonly known as:  ZANTAC  Take 1 tablet (300 mg total) by mouth at bedtime.       F/U with Dr. Myra Gianotti in 2 weeks for wound check and discuss further treatment plans.    Thomasena Edis Jagger Demonte [facility] PA-C [facility] [place] Office: [phone]   01/14/2013, 7:52 AM --- For VQI Registry use --- Instructions: Press F2 to tab through selections.  Delete question if not applicable.   Modified Rankin score at D/C (0-6): Rankin Score=0  IV medication needed for:  1. Hypertension: No 2. Hypotension: No  Post-op Complications: No  1. Post-op CVA or TIA: No  If yes: Event classification (right eye, left eye, right cortical, left cortical, verterobasilar, other):   If yes: Timing of event (intra-op, <6 hrs post-op, >=6 hrs post-op, unknown):   2. CN injury: No  If yes: CN  injuried   3. Myocardial infarction: No  If yes: Dx by (EKG or clinical, Troponin):   4.  CHF: No  5.  Dysrhythmia (new): No  6. Wound infection: No  7. Reperfusion symptoms: No  8. Return to OR: No  If yes: return to OR for (bleeding, neurologic, other CEA incision, other):   Discharge medications: Statin use:  Yes ASA use:  Yes Beta blocker use:  Yes ACE-Inhibitor use:  Yes P2Y12 Antagonist use: [x ] None, [ ]  Plavix, [ ]  Plasugrel, [ ]  Ticlopinine, [ ]  Ticagrelor, [ ]  Other, [ ]  No for medical reason, [ ]  Non-compliant, [ ]  Not-indicated Anti-coagulant use:  [x ] None, [ ]  Warfarin, [ ]  Rivaroxaban, [ ]  Dabigatran, [ ]  Other, [ ]  No for medical reason, [ ]  Non-compliant, [ ]  Not-indicated

## 2013-01-14 NOTE — Progress Notes (Signed)
VASCULAR AND VEIN SURGERY POST - OP CEA NOTE  HPI:.id had a left Carotid Endarterectomy 1 day ago. Patient is doing well.  Patient denies headache; denies difficulty swallowing; denies weakness; denies symptoms of stroke or TIA  Filed Vitals:   01/14/13 0417  BP: 109/72  Pulse: 69  Temp: 98.5 F (36.9 C)  Resp: 18     Physical Exam: Pt is A&O x 3 Speech is fluent left Neck Wound is clean, dry, intact or healed Negative weakness in all extremities Negative tongue deviation Negative facial droop  Plan: Follow-up in 4 weeks with Carotid Duplex scan

## 2013-01-14 NOTE — Progress Notes (Signed)
Pt discharged to home via w/c with belongings and daugther, discharge instructions explained and discussed with patient and daugther, prescriptions, f/u appt given to patient. No complaints of pain at this time. Beryle Quant

## 2013-01-17 ENCOUNTER — Encounter (HOSPITAL_COMMUNITY): Payer: Self-pay | Admitting: Surgery

## 2013-01-21 ENCOUNTER — Telehealth: Payer: Self-pay | Admitting: Surgery

## 2013-01-21 ENCOUNTER — Telehealth: Payer: Self-pay

## 2013-01-21 NOTE — Telephone Encounter (Signed)
Phone call from pt's. daughter.  Stated that pt. did not receive any instructions regarding incision care when he was discharged from hospital.   Phone call returned to pt.  Advised he can shower and to use an antibacterial soap to cleanse incisional area.  Encouraged to keep area clean and dry.  Pt. questioned about "some swelling" in left neck area.  Advised small to moderate swelling is expected in the immediate post-operative period.  Encouraged to monitor for any increased swelling, redness, warmth, increased tenderness, fever/chills, and call to report symptoms.  Pt. verb. understanding.

## 2013-01-21 NOTE — Telephone Encounter (Signed)
Message copied by Jena Gauss on Fri Jan 21, 2013  4:17 PM ------      Message from: Phillips Odor      Created: Fri Jan 21, 2013  9:58 AM      Regarding: needs f/u appt. w/ [facility]      Contact: [phone]       S/P attempted CEA 7/17/ needs 2 week hospital f/u w/ Dr. Myra Gianotti for incision check.  (I was talking to pt. about his incision care, and he inquired about his f/u appt. ) ------

## 2013-01-25 NOTE — Discharge Summary (Signed)
Agree with above  Randy [name] 

## 2013-01-28 ENCOUNTER — Encounter: Payer: Self-pay | Admitting: Surgery

## 2013-01-31 ENCOUNTER — Other Ambulatory Visit: Payer: Self-pay

## 2013-01-31 ENCOUNTER — Encounter: Payer: Self-pay | Admitting: Surgery

## 2013-01-31 ENCOUNTER — Ambulatory Visit (INDEPENDENT_AMBULATORY_CARE_PROVIDER_SITE_OTHER): Payer: BC Managed Care – PPO | Admitting: Surgery

## 2013-01-31 VITALS — BP 176/104 | HR 70 | Temp 98.3°F | Resp 16 | Ht 70.5 in | Wt 181.0 lb

## 2013-01-31 DIAGNOSIS — I6529 Occlusion and stenosis of unspecified carotid artery: Secondary | ICD-10-CM

## 2013-01-31 DIAGNOSIS — M79609 Pain in unspecified limb: Secondary | ICD-10-CM

## 2013-01-31 MED ORDER — CLOPIDOGREL BISULFATE 75 MG PO TABS: ORAL_TABLET | ORAL | Status: DC

## 2013-01-31 NOTE — Progress Notes (Signed)
[facility]   Patient name: Randy [name] MRN: [id number] DOB: [birth year] Sex: male     Chief Complaint  Patient presents with  . Carotid    ATTEMPTED  left cea 01-13-13 C/O discuss what the next steps are for Carotid stenosis Left neck-painful    HISTORY OF PRESENT ILLNESS: The patient is back today for followup. He is status post attempt at left carotid endarterectomy on 01/13/2013. He has previously undergone right carotid endarterectomy in May of 2012 at the time of coronary artery bypass grafting. In the operating room in July, I was unable to palpate an end to his disease, dissecting all the way out to the skull base. After discussions with several of my partners I felt it was not safe to open the artery and therefore the incision was close. He is back today for followup. He complains of burning around his incision site as well as numbness. He has no other neurologic deficits.  Past Medical History  Diagnosis Date  . Hypertension   . CAD (coronary artery disease)   . Hx of CABG 2010  . Polysubstance abuse   . Abnormal heart rhythm   . Heart attack 2010  . Hyperlipidemia   . Lung nodule   . Irregular heartbeat   . Shortness of breath     when lying flat on back  . PVD (peripheral vascular disease)   . AAA (abdominal aortic aneurysm) July 2013  . Carotid artery occlusion   . GERD (gastroesophageal reflux disease)   . Anxiety     Past Surgical History  Procedure Laterality Date  . Coronary artery bypass graft  2010  . Endarterectomy Left 01/13/2013    Procedure: ATTEMPTED ENDARTERECTOMY CAROTID;  Surgeon: Nada Libman, MD;  Location: [facility] OR;  Service: Vascular;  Laterality: Left;  . Carotid endarterectomy Left 01-13-13    Attempted cea    History   Social History  . Marital Status: Widowed    Spouse Name: N/A    Number of Children: Y  . Years of Education: N/A   Occupational History  . not working.     Social History Main Topics   . Smoking status: Former Smoker -- 0.25 packs/day for 30 years    Types: Cigarettes    Quit date: 01/13/2013  . Smokeless tobacco: Never Used  . Alcohol Use: Yes     Comment: occasionally  . Drug Use: No  . Sexually Active: Not on file   Other Topics Concern  . Not on file   Social History Narrative   Pt is widowed and lives alone.          Family History  Problem Relation Age of Onset  . Asthma Mother   . Diabetes Mother   . Hyperlipidemia Mother   . Hypertension Mother   . Lung cancer Father   . Cancer Father   . Stroke Sister     Allergies as of 01/31/2013  . (No Known Allergies)    Current Outpatient Prescriptions on File Prior to Visit  Medication Sig Dispense Refill  . aspirin 81 MG tablet Take 81 mg by mouth daily.       . carvedilol (COREG) 12.5 MG tablet Take 1 tablet (12.5 mg total) by mouth 2 (two) times daily with a meal.  60 tablet  3  . lisinopril-hydrochlorothiazide (PRINZIDE,ZESTORETIC) 10-12.5 MG per tablet Take 2 tablets by mouth daily.  30 tablet  3  . lovastatin (MEVACOR) 20 MG tablet  Take 1 tablet (20 mg total) by mouth at bedtime.  90 tablet  3  . omeprazole (PRILOSEC OTC) 20 MG tablet Take 20 mg by mouth daily.      [name] oxyCODONE-acetaminophen (PERCOCET/ROXICET) 5-325 MG per tablet Take 1-2 tablets by mouth every 4 (four) hours as needed.  30 tablet  0  . ranitidine (ZANTAC) 300 MG tablet Take 1 tablet (300 mg total) by mouth at bedtime.  90 tablet  1   No current facility-administered medications on file prior to visit.     REVIEW OF SYSTEMS: No change from prior visit  PHYSICAL EXAMINATION:   Vital signs are BP 176/104  Pulse 70  Temp(Src) 98.3 F (36.8 C) (Oral)  Resp 16  Ht 5' 10.5" (1.791 m)  Wt 181 lb (82.101 kg)  BMI 25.6 kg/m2  SpO2 100% General: The patient appears their stated age. HEENT:  No gross abnormalities Pulmonary:  Non labored breathing Musculoskeletal: There are no major deformities. Neurologic: No focal  weakness or paresthesias are detected, Skin: There are no ulcer or rashes noted. Psychiatric: The patient has normal affect. Cardiovascular: There is a regular rate and rhythm without significant murmur appreciated.   Diagnostic Studies None  Assessment: Asymptomatic left carotid stenosis Plan: The patient is no longer a candidate for open carotid endarterectomy do to the extent of his disease. The only option available to treat his left carotid stenosis is carotid stenting. I have scheduled this for Tuesday, August 26. He will start Plavix 1 week prior to the procedure. I discussed the risks and benefits which include the risk of stroke with the patient.  [name], M.D. [facility] [place] Office: [phone] Pager:  [phone]

## 2013-01-31 NOTE — Progress Notes (Signed)
Per verbal order Dr. Myra Gianotti, Plavix 75 mg., tab daily, # 30; refills x 6 ordered at pharmacy.  Pt. Instructed to start Plavix 1 wk. Prior to procedure; instructed to start 1st dose 02/15/13.

## 2013-02-04 ENCOUNTER — Other Ambulatory Visit: Payer: Self-pay

## 2013-02-15 ENCOUNTER — Telehealth: Payer: Self-pay | Admitting: Family Medicine

## 2013-02-15 ENCOUNTER — Telehealth: Payer: Self-pay

## 2013-02-15 MED ORDER — CYCLOBENZAPRINE HCL 10 MG PO TABS: 10.0000 mg | ORAL_TABLET | Freq: Three times a day (TID) | ORAL | Status: DC | PRN

## 2013-02-15 NOTE — Telephone Encounter (Signed)
Pt. Called to request refill on Oxycodone.  Stated "my back is killing me."  Reports "increased pain in low back since last Wednesday or Thursday".  Also c/o numbness in left leg. States "I can't hardly walk."   No c/o abdominal pain. Voiced concern that he "has an aneurysm that Dr. Myra Gianotti is going to fix sometime."  Noted hx of 4.1cm. infrarenal AAA, and chronic left common iliac artery occlusion in ER note of 01/11/13.  Discussed with Dr. Myra Gianotti.  Advised that pt. should call his medical doctor, since the chronic back pain is not being managed by [facility].  Phone call back to pt.; spoke with daughter.  Informed that pt. needs to get evaluated by his medical doctor or neurologist, whomever pt. is managing his chronic back pain.  Daughter verb. understanding.

## 2013-02-15 NOTE — Telephone Encounter (Signed)
Dr. Jarvis Newcomer is now this patient's PCP.  Will send in 1 month's worth medication but he needs to make an appt to meet his new PCP.

## 2013-02-15 NOTE — Telephone Encounter (Signed)
The patients sister is calling asking Dr. Gwendolyn Grant for a refill on Cyclobenzaprine.  Randy [name] 's back has gone back out.

## 2013-02-17 ENCOUNTER — Encounter (HOSPITAL_COMMUNITY): Payer: Self-pay | Admitting: Pharmacy Technician

## 2013-02-22 ENCOUNTER — Encounter (HOSPITAL_COMMUNITY): Payer: Self-pay | Admitting: *Deleted

## 2013-02-22 ENCOUNTER — Encounter (HOSPITAL_COMMUNITY)
Admission: RE | Disposition: A | Discharge status: Home / Self Care | Payer: Self-pay | Source: Ambulatory Visit | Attending: Surgery

## 2013-02-22 ENCOUNTER — Inpatient Hospital Stay (HOSPITAL_COMMUNITY)
Admission: RE | Admit: 2013-02-22 | Discharge: 2013-02-23 | DRG: 839 | Disposition: A | Discharge status: Home / Self Care | Payer: BC Managed Care – PPO | Source: Ambulatory Visit | Attending: Surgery | Admitting: Surgery

## 2013-02-22 ENCOUNTER — Other Ambulatory Visit: Payer: Self-pay

## 2013-02-22 DIAGNOSIS — I252 Old myocardial infarction: Secondary | ICD-10-CM

## 2013-02-22 DIAGNOSIS — K219 Gastro-esophageal reflux disease without esophagitis: Secondary | ICD-10-CM | POA: Diagnosis present

## 2013-02-22 DIAGNOSIS — Z87891 Personal history of nicotine dependence: Secondary | ICD-10-CM

## 2013-02-22 DIAGNOSIS — I2510 Atherosclerotic heart disease of native coronary artery without angina pectoris: Secondary | ICD-10-CM | POA: Diagnosis present

## 2013-02-22 DIAGNOSIS — I6529 Occlusion and stenosis of unspecified carotid artery: Principal | ICD-10-CM | POA: Diagnosis present

## 2013-02-22 DIAGNOSIS — E785 Hyperlipidemia, unspecified: Secondary | ICD-10-CM | POA: Diagnosis present

## 2013-02-22 DIAGNOSIS — Z951 Presence of aortocoronary bypass graft: Secondary | ICD-10-CM

## 2013-02-22 DIAGNOSIS — Z7982 Long term (current) use of aspirin: Secondary | ICD-10-CM

## 2013-02-22 DIAGNOSIS — I739 Peripheral vascular disease, unspecified: Secondary | ICD-10-CM | POA: Diagnosis present

## 2013-02-22 DIAGNOSIS — I499 Cardiac arrhythmia, unspecified: Secondary | ICD-10-CM | POA: Diagnosis present

## 2013-02-22 DIAGNOSIS — I10 Essential (primary) hypertension: Secondary | ICD-10-CM | POA: Diagnosis present

## 2013-02-22 DIAGNOSIS — F411 Generalized anxiety disorder: Secondary | ICD-10-CM | POA: Diagnosis present

## 2013-02-22 DIAGNOSIS — I714 Abdominal aortic aneurysm, without rupture, unspecified: Secondary | ICD-10-CM | POA: Diagnosis present

## 2013-02-22 DIAGNOSIS — Z79899 Other long term (current) drug therapy: Secondary | ICD-10-CM

## 2013-02-22 HISTORY — PX: CAROTID STENT INSERTION: SHX5505

## 2013-02-22 LAB — POCT I-STAT, CHEM 8
BUN: 10 mg/dL (ref 6–23)
Calcium, Ion: 1.21 mmol/L (ref 1.12–1.23)
Creatinine, Ser: 0.9 mg/dL (ref 0.50–1.35)
TCO2: 25 mmol/L (ref 0–100)

## 2013-02-22 LAB — POCT ACTIVATED CLOTTING TIME
Activated Clotting Time: 319 seconds
Activated Clotting Time: 309 seconds

## 2013-02-22 SURGERY — CAROTID STENT INSERTION
Anesthesia: LOCAL | Laterality: Left

## 2013-02-22 MED ORDER — HYDRALAZINE HCL 20 MG/ML IJ SOLN: 10.0000 mg | INTRAMUSCULAR | Status: DC | PRN

## 2013-02-22 MED ORDER — METOPROLOL TARTRATE 1 MG/ML IV SOLN: 2.0000 mg | INTRAVENOUS | Status: DC | PRN

## 2013-02-22 MED ORDER — LISINOPRIL-HYDROCHLOROTHIAZIDE 10-12.5 MG PO TABS: 2.0000 | ORAL_TABLET | Freq: Every day | ORAL | Status: DC

## 2013-02-22 MED ORDER — ASPIRIN 81 MG PO TABS: 81.0000 mg | ORAL_TABLET | Freq: Every day | ORAL | Status: DC

## 2013-02-22 MED ORDER — NOREPINEPHRINE BITARTRATE 1 MG/ML IJ SOLN
INTRAMUSCULAR | Status: AC
  Filled 2013-02-22: qty 4

## 2013-02-22 MED ORDER — SIMVASTATIN 10 MG PO TABS
10.0000 mg | ORAL_TABLET | Freq: Every day | ORAL | Status: DC
  Administered 2013-02-22: 10 mg via ORAL
  Filled 2013-02-22 (×2): qty 1

## 2013-02-22 MED ORDER — MORPHINE SULFATE 10 MG/ML IJ SOLN
2.0000 mg | INTRAMUSCULAR | Status: DC | PRN
  Administered 2013-02-22 (×2): 2 mg via INTRAVENOUS

## 2013-02-22 MED ORDER — ATROPINE SULFATE 1 MG/ML IJ SOLN
INTRAMUSCULAR | Status: AC
  Filled 2013-02-22: qty 1

## 2013-02-22 MED ORDER — MORPHINE SULFATE 2 MG/ML IJ SOLN
INTRAMUSCULAR | Status: AC
  Filled 2013-02-22: qty 1

## 2013-02-22 MED ORDER — HYDROCHLOROTHIAZIDE 12.5 MG PO CAPS
12.5000 mg | ORAL_CAPSULE | Freq: Every day | ORAL | Status: DC
  Filled 2013-02-22: qty 1

## 2013-02-22 MED ORDER — SODIUM CHLORIDE 0.9 % IV SOLN
INTRAVENOUS | Status: DC
  Administered 2013-02-22: 1000 mL via INTRAVENOUS

## 2013-02-22 MED ORDER — GUAIFENESIN-DM 100-10 MG/5ML PO SYRP: 15.0000 mL | ORAL_SOLUTION | ORAL | Status: DC | PRN

## 2013-02-22 MED ORDER — SODIUM CHLORIDE 0.9 % IV SOLN: INTRAVENOUS | Status: DC

## 2013-02-22 MED ORDER — OMEPRAZOLE MAGNESIUM 20 MG PO TBEC: 20.0000 mg | DELAYED_RELEASE_TABLET | Freq: Every day | ORAL | Status: DC

## 2013-02-22 MED ORDER — ACETAMINOPHEN 325 MG RE SUPP
325.0000 mg | RECTAL | Status: DC | PRN
  Filled 2013-02-22: qty 2

## 2013-02-22 MED ORDER — ASPIRIN 81 MG PO CHEW
81.0000 mg | CHEWABLE_TABLET | Freq: Every day | ORAL | Status: DC
  Administered 2013-02-22: 81 mg via ORAL
  Filled 2013-02-22: qty 1

## 2013-02-22 MED ORDER — PANTOPRAZOLE SODIUM 40 MG PO TBEC
40.0000 mg | DELAYED_RELEASE_TABLET | Freq: Every day | ORAL | Status: DC
  Administered 2013-02-22: 40 mg via ORAL
  Filled 2013-02-22: qty 1

## 2013-02-22 MED ORDER — LIDOCAINE HCL (PF) 1 % IJ SOLN
INTRAMUSCULAR | Status: AC
  Filled 2013-02-22: qty 30

## 2013-02-22 MED ORDER — OXYCODONE-ACETAMINOPHEN 5-325 MG PO TABS: 1.0000 | ORAL_TABLET | ORAL | Status: DC | PRN

## 2013-02-22 MED ORDER — ONDANSETRON HCL 4 MG/2ML IJ SOLN: 4.0000 mg | Freq: Four times a day (QID) | INTRAMUSCULAR | Status: DC | PRN

## 2013-02-22 MED ORDER — CARVEDILOL 12.5 MG PO TABS
12.5000 mg | ORAL_TABLET | Freq: Two times a day (BID) | ORAL | Status: DC
  Administered 2013-02-23: 12.5 mg via ORAL
  Filled 2013-02-22 (×4): qty 1

## 2013-02-22 MED ORDER — LABETALOL HCL 5 MG/ML IV SOLN: 10.0000 mg | INTRAVENOUS | Status: DC | PRN

## 2013-02-22 MED ORDER — FAMOTIDINE 10 MG PO TABS
10.0000 mg | ORAL_TABLET | Freq: Two times a day (BID) | ORAL | Status: DC
  Administered 2013-02-22: 10 mg via ORAL
  Filled 2013-02-22 (×3): qty 1

## 2013-02-22 MED ORDER — CYCLOBENZAPRINE HCL 10 MG PO TABS: 10.0000 mg | ORAL_TABLET | Freq: Three times a day (TID) | ORAL | Status: DC | PRN

## 2013-02-22 MED ORDER — CLOPIDOGREL BISULFATE 75 MG PO TABS
75.0000 mg | ORAL_TABLET | Freq: Every day | ORAL | Status: DC
  Administered 2013-02-23: 75 mg via ORAL
  Filled 2013-02-22 (×2): qty 1

## 2013-02-22 MED ORDER — MORPHINE SULFATE 2 MG/ML IJ SOLN
INTRAMUSCULAR | Status: AC
  Administered 2013-02-22: 2 mg via INTRAVENOUS
  Filled 2013-02-22: qty 1

## 2013-02-22 MED ORDER — LISINOPRIL 10 MG PO TABS
10.0000 mg | ORAL_TABLET | Freq: Every day | ORAL | Status: DC
  Filled 2013-02-22: qty 1

## 2013-02-22 MED ORDER — ALUM & MAG HYDROXIDE-SIMETH 200-200-20 MG/5ML PO SUSP: 15.0000 mL | ORAL | Status: DC | PRN

## 2013-02-22 MED ORDER — MORPHINE SULFATE 2 MG/ML IJ SOLN
2.0000 mg | INTRAMUSCULAR | Status: DC | PRN
  Administered 2013-02-22: 2 mg via INTRAVENOUS

## 2013-02-22 MED ORDER — ACETAMINOPHEN 325 MG PO TABS: 325.0000 mg | ORAL_TABLET | ORAL | Status: DC | PRN

## 2013-02-22 MED ORDER — BIVALIRUDIN 250 MG IV SOLR
INTRAVENOUS | Status: AC
  Filled 2013-02-22: qty 250

## 2013-02-22 MED ORDER — PHENOL 1.4 % MT LIQD: 1.0000 | OROMUCOSAL | Status: DC | PRN

## 2013-02-22 NOTE — Interval H&P Note (Signed)
History and Physical Interval Note:  02/22/2013 7:30 AM  Randy [name]  has presented today for surgery, with the diagnosis of Stenosis  The various methods of treatment have been discussed with the patient and family. After consideration of risks, benefits and other options for treatment, the patient has consented to  Procedure(s): CAROTID STENT INSERTION (Left) as a surgical intervention .  The patient's history has been reviewed, patient examined, no change in status, stable for surgery.  I have reviewed the patient's chart and labs.  Questions were answered to the patient's satisfaction.     BRABHAM IV, V. WELLS

## 2013-02-22 NOTE — Op Note (Signed)
[facility]  Patient name: Randy [name] MRN: [id number] DOB: 09/26/1960 Sex: male  02/22/2013 Pre-operative Diagnosis: Asymptomatic left carotid stenosis Post-operative diagnosis:  Same Surgeon:  [name] Co-Surgeon:  Nanetta Batty Procedure Performed:  1.  ultrasound-guided access, right femoral artery  2.  aortic arch angiogram  3.  left carotid stem with distal embolic protection \   Indications:  The patient comes in today for possible carotid stenting. He has previously undergone an attempt at carotid endarterectomy, however because of the calcification up to the skull base, I did not open his carotid artery, as I felt that I could not get a good endpoint intraoperatively. He comes in today for stenting  Procedure:  The patient was identified in the holding area and taken to room 8.  The patient was then placed supine on the table and prepped and draped in the usual sterile fashion.  A time out was called.  Ultrasound was used to evaluate the right common femoral artery.  It was patent .  A digital ultrasound image was acquired.  A micropuncture needle was used to access the right common femoral artery under ultrasound guidance.  An 018 wire was advanced without resistance and a micropuncture sheath was placed.  The 018 wire was removed and a benson wire was placed.  The micropuncture sheath was exchanged for a 5 french sheath. Over the wire, a Omni flush catheter was advanced into the descending aorta and an aortic arch angiogram was performed. Next using a Simmons 2 catheter, the left carotid artery was selected and a left carotid angiogram with intracranial images was acquired. The intracranial images will be interpreted separately by neuroradiology  Findings:   Aortic arch:  A bovine aortic arch is identified. The innominate artery is widely patent. The right subclavian artery is widely patent. The right common carotid artery is widely patent.  There is a patulous dilatation of the right carotid bifurcation. The left common carotid artery is widely patent. There is a focal 80% stenosis at the carotid bifurcation. The left subclavian artery is patent throughout it's course.   Left carotid:  Left common carotid artery is patent throughout it's course. At the carotid bifurcation there is approximately 80% stenosis with ulcerated plaque.    Intervention:  After the above images were acquired, the decision was made to proceed with intervention. Over an 035 Benson wire a 6 [place] long sheath was inserted. An Angiomax bolus and continuous infusion was administered which was confirmed with an ACT greater than 300. Using a Simmons 2 catheter catheter the left common carotid artery was selected. The sheath was then advanced into the distal left common carotid artery. Next, a large NAV-6 filter was prepared on the back table filter was then positioned in the distal internal carotid artery and successfully deployed. Next, a 3 x 2 balloon was used to predilate the lesion. An Abbott XACT 10x8x40 was selected. This was successfully deployed across the lesion and then molded to confirmation with a 5.5x20 balloon. Completion angiography revealed excellent result with no residual stenosis. Completion images were then acquired. The filter was removed. The long sheath was exchanged out for a short 6 [place] sheath. The patient remained neurologically intact throughout the procedure.  Impression:  #1  successful stenting of the left carotid artery with distal embolic protection using a large NAV-6 filter and aXACT 10x8x40 stent  #2  3 intervention stenosis was 80%, down to less than 10% post procedure  Theotis Burrow, M.D. [facility] [place] Office: [phone] Pager:  [phone]

## 2013-02-22 NOTE — Progress Notes (Signed)
Utilization review completed.  

## 2013-02-22 NOTE — H&P (View-Only) (Signed)
[facility] [place]   Patient name: Randy [name] MRN: [id number] DOB: 04/24/1961 Sex: male     Chief Complaint  Patient presents with  . Carotid    ATTEMPTED  left cea 01-13-13 C/O discuss what the next steps are for Carotid stenosis Left neck-painful    HISTORY OF PRESENT ILLNESS: The patient is back today for followup. He is status post attempt at left carotid endarterectomy on 01/13/2013. He has previously undergone right carotid endarterectomy in May of 2012 at the time of coronary artery bypass grafting. In the operating room in July, I was unable to palpate an end to his disease, dissecting all the way out to the skull base. After discussions with several of my partners I felt it was not safe to open the artery and therefore the incision was close. He is back today for followup. He complains of burning around his incision site as well as numbness. He has no other neurologic deficits.  Past Medical History  Diagnosis Date  . Hypertension   . CAD (coronary artery disease)   . Hx of CABG 2010  . Polysubstance abuse   . Abnormal heart rhythm   . Heart attack 2010  . Hyperlipidemia   . Lung nodule   . Irregular heartbeat   . Shortness of breath     when lying flat on back  . PVD (peripheral vascular disease)   . AAA (abdominal aortic aneurysm) July 2013  . Carotid artery occlusion   . GERD (gastroesophageal reflux disease)   . Anxiety     Past Surgical History  Procedure Laterality Date  . Coronary artery bypass graft  2010  . Endarterectomy Left 01/13/2013    Procedure: ATTEMPTED ENDARTERECTOMY CAROTID;  Surgeon: Vance W Brabham, MD;  Location: [facility] OR;  Service: Vascular;  Laterality: Left;  . Carotid endarterectomy Left 01-13-13    Attempted cea    History   Social History  . Marital Status: Widowed    Spouse Name: N/A    Number of Children: Y  . Years of Education: N/A   Occupational History  . not working.     Social History Main Topics   . Smoking status: Former Smoker -- 0.25 packs/day for 30 years    Types: Cigarettes    Quit date: 01/13/2013  . Smokeless tobacco: Never Used  . Alcohol Use: Yes     Comment: occasionally  . Drug Use: No  . Sexually Active: Not on file   Other Topics Concern  . Not on file   Social History Narrative   Pt is widowed and lives alone.          Family History  Problem Relation Age of Onset  . Asthma Mother   . Diabetes Mother   . Hyperlipidemia Mother   . Hypertension Mother   . Lung cancer Father   . Cancer Father   . Stroke Sister     Allergies as of 01/31/2013  . (No Known Allergies)    Current Outpatient Prescriptions on File Prior to Visit  Medication Sig Dispense Refill  . aspirin 81 MG tablet Take 81 mg by mouth daily.       . carvedilol (COREG) 12.5 MG tablet Take 1 tablet (12.5 mg total) by mouth 2 (two) times daily with a meal.  60 tablet  3  . lisinopril-hydrochlorothiazide (PRINZIDE,ZESTORETIC) 10-12.5 MG per tablet Take 2 tablets by mouth daily.  30 tablet  3  . lovastatin (MEVACOR) 20 MG tablet   Take 1 tablet (20 mg total) by mouth at bedtime.  90 tablet  3  . omeprazole (PRILOSEC OTC) 20 MG tablet Take 20 mg by mouth daily.      . oxyCODONE-acetaminophen (PERCOCET/ROXICET) 5-325 MG per tablet Take 1-2 tablets by mouth every 4 (four) hours as needed.  30 tablet  0  . ranitidine (ZANTAC) 300 MG tablet Take 1 tablet (300 mg total) by mouth at bedtime.  90 tablet  1   No current facility-administered medications on file prior to visit.     REVIEW OF SYSTEMS: No change from prior visit  PHYSICAL EXAMINATION:   Vital signs are BP 176/104  Pulse 70  Temp(Src) 98.3 F (36.8 C) (Oral)  Resp 16  Ht 5' 10.5" (1.791 m)  Wt 181 lb (82.101 kg)  BMI 25.6 kg/m2  SpO2 100% General: The patient appears their stated age. HEENT:  No gross abnormalities Pulmonary:  Non labored breathing Musculoskeletal: There are no major deformities. Neurologic: No focal  weakness or paresthesias are detected, Skin: There are no ulcer or rashes noted. Psychiatric: The patient has normal affect. Cardiovascular: There is a regular rate and rhythm without significant murmur appreciated.   Diagnostic Studies None  Assessment: Asymptomatic left carotid stenosis Plan: The patient is no longer a candidate for open carotid endarterectomy do to the extent of his disease. The only option available to treat his left carotid stenosis is carotid stenting. I have scheduled this for Tuesday, August 26. He will start Plavix 1 week prior to the procedure. I discussed the risks and benefits which include the risk of stroke with the patient.  V. Wells Brabham IV, M.D. [facility] [place] Office: [phone] Pager:  [phone]   

## 2013-02-23 ENCOUNTER — Telehealth: Payer: Self-pay | Admitting: Surgery

## 2013-02-23 ENCOUNTER — Other Ambulatory Visit: Payer: Self-pay | Admitting: *Deleted

## 2013-02-23 DIAGNOSIS — Z01810 Encounter for preprocedural cardiovascular examination: Secondary | ICD-10-CM

## 2013-02-23 DIAGNOSIS — I739 Peripheral vascular disease, unspecified: Secondary | ICD-10-CM

## 2013-02-23 DIAGNOSIS — Z48812 Encounter for surgical aftercare following surgery on the circulatory system: Secondary | ICD-10-CM

## 2013-02-23 MED ORDER — CLOPIDOGREL BISULFATE 75 MG PO TABS: ORAL_TABLET | ORAL | Status: DC

## 2013-02-23 MED FILL — Sodium Chloride IV Soln 0.9%: INTRAVENOUS | Qty: 50 | Status: AC

## 2013-02-23 NOTE — Telephone Encounter (Addendum)
Message copied by Fredrich Birks on Wed Feb 23, 2013  1:21 PM ------      Message from: [place], [place] K      Created: Wed Feb 23, 2013  8:22 AM      Regarding: schedule                   ----- Message -----         From: Dara Lords, PA-C         Sent: 02/22/2013   3:32 PM           To: Sharee Pimple, CMA            S/p left carotid stent placement 02/22/13.  F/u with Dr. Myra Gianotti in 4 weeks.            Thanks,      Samantha ------  02/23/2013: spoke with patient to confirm, dpm

## 2013-02-23 NOTE — Discharge Summary (Signed)
I agree with the above. The patient is doing very well status post left carotid stent  Durene Cal

## 2013-02-23 NOTE — Progress Notes (Signed)
[facility] Progress Note  02/23/2013 8:18 AM 1 Day Post-Op  Subjective:  No complaints  Afebrile x 24 hrs VSS 98% RA  Filed Vitals:   02/23/13 0743  BP: 144/82  Pulse: 63  Temp: 97.6 F (36.4 C)  Resp:      Physical Exam: Neuro:  In tact Incision:  Right groin is soft; bandage is in tact and clean Extremity:  Right DP palpable pulse and right foot warm.    CBC    Component Value Date/Time   WBC 10.2 01/14/2013 0433   RBC 4.79 01/14/2013 0433   HGB 15.3 02/22/2013 0603   HCT 45.0 02/22/2013 0603   PLT 176 01/14/2013 0433   MCV 88.3 01/14/2013 0433   MCH 29.9 01/14/2013 0433   MCHC 33.8 01/14/2013 0433   RDW 14.4 01/14/2013 0433   LYMPHSABS 3.4 03/19/2011 0455   MONOABS 0.5 03/19/2011 0455   EOSABS 0.3 03/19/2011 0455   BASOSABS 0.1 03/19/2011 0455    BMET    Component Value Date/Time   NA 138 02/22/2013 0603   K 4.2 02/22/2013 0603   CL 101 02/22/2013 0603   CO2 27 01/14/2013 0433   GLUCOSE 107* 02/22/2013 0603   BUN 10 02/22/2013 0603   CREATININE 0.90 02/22/2013 0603   CREATININE 1.00 02/10/2012 1654   CALCIUM 8.3* 01/14/2013 0433   GFRNONAA >90 01/14/2013 0433   GFRAA >90 01/14/2013 0433     Intake/Output Summary (Last 24 hours) at 02/23/13 0818 Last data filed at 02/22/13 1400  Gross per 24 hour  Intake    465 ml  Output      0 ml  Net    465 ml      Assessment/Plan:  This is a 52 y.o. male who is s/p  1. ultrasound-guided access, right femoral artery  2. aortic arch angiogram  3. left carotid stem with distal embolic protection   1 Day Post-Op  -pt is doing well this am. -pt neuro exam is in tact -pt has ambulated -will discharge home today -f/u with Dr. Myra Gianotti in 4 weeks.   Doreatha Massed, PA-C [facility] [phone]

## 2013-02-23 NOTE — Discharge Summary (Signed)
[facility] Discharge Summary  Randy [name] May 03, 1961 52 y.o. male  [id number]  Admission Date: 02/22/2013  Discharge Date: 02/23/13  Physician: Nada Libman, MD  Admission Diagnosis: Stenosis   HPI:   This is a 52 y.o. male who is status post attempt at left carotid endarterectomy on 01/13/2013. He has previously undergone right carotid endarterectomy in May of 2012 at the time of coronary artery bypass grafting. In the operating room in July, I was unable to palpate an end to his disease, dissecting all the way out to the skull base. After discussions with several of my partners I felt it was not safe to open the artery and therefore the incision was close. He is back today for followup. He complains of burning around his incision site as well as numbness. He has no other neurologic deficits.  Hospital Course:  The patient was admitted to the hospital and taken to the operating room on 02/22/2013 and underwent  1. ultrasound-guided access, right femoral artery  2. aortic arch angiogram  3. left carotid stem with distal embolic protection  The pt tolerated the procedure well and was transported to the PACU in good condition.   By POD 1, the pt neuro status is in tact.  He is restarted on his plavix and given the nature of his atherosclerotic disease, he should stay on this medication.  The remainder of the hospital course consisted of increasing mobilization and increasing intake of solids without difficulty.    Recent Labs  02/22/13 0603  NA 138  K 4.2  CL 101  GLUCOSE 107*  BUN 10    Recent Labs  02/22/13 0603  HGB 15.3  HCT 45.0   No results found for this basename: INR,  in the last 72 hours  Discharge Instructions:   The patient is discharged to home with extensive instructions on wound care and progressive ambulation.  They are instructed not to drive or perform any heavy lifting until returning to see the physician in his  office.  Discharge Orders   Future Orders Complete By Expires   Call MD for:  redness, tenderness, or signs of infection (pain, swelling, bleeding, redness, odor or green/yellow discharge around incision site)  As directed    Call MD for:  severe or increased pain, loss or decreased feeling  in affected limb(s)  As directed    Call MD for:  temperature >100.5  As directed    CAROTID Sugery: Call MD for difficulty swallowing or speaking; weakness in arms or legs that is a new symtom; severe headache.  If you have increased swelling in the neck and/or  are having difficulty breathing, CALL 911  As directed    Discharge wound care:  As directed    Comments:     Shower daily with soap and water starting 02/23/13   Driving Restrictions  As directed    Comments:     No driving for 2 weeks   Lifting restrictions  As directed    Comments:     No lifting for 2 weeks   Resume previous diet  As directed       Discharge Diagnosis:  Stenosis  Secondary Diagnosis: Patient Active Problem List   Diagnosis Date Noted  . Pain in limb 01/03/2013  . Lumbago 10/28/2012  . AAA (abdominal aortic aneurysm) without rupture 10/28/2012  . Peripheral vascular disease 02/12/2012  . Anxiety 02/12/2012  . Uncontrolled hypertension 02/02/2012  . Tobacco abuse 02/02/2012  .  Financial difficulties 02/02/2012  . Occlusion and stenosis of carotid artery without mention of cerebral infarction 01/19/2012  . Pulmonary nodule 05/19/2011  . HYPERLIPIDEMIA-MIXED 10/19/2008  . ACUTE POSTHEMORRHAGIC ANEMIA 10/19/2008  . OTH MIXED/UNSPEC NONDEPENDENT DRUG ABUSE UNSPEC 10/19/2008  . HYPERTENSION, UNSPECIFIED 10/19/2008  . CAD, ARTERY BYPASS GRAFT 10/19/2008  . DISC DISEASE, LUMBAR 10/19/2008  . CHEST PAIN-UNSPECIFIED 10/19/2008   Past Medical History  Diagnosis Date  . Hypertension   . CAD (coronary artery disease)   . Hx of CABG 2010  . Polysubstance abuse   . Abnormal heart rhythm   . Heart attack 2010  .  Hyperlipidemia   . Lung nodule   . Irregular heartbeat   . Shortness of breath     when lying flat on back  . PVD (peripheral vascular disease)   . AAA (abdominal aortic aneurysm) July 2013  . Carotid artery occlusion   . GERD (gastroesophageal reflux disease)   . Anxiety       Medication List         aspirin 81 MG tablet  Take 81 mg by mouth daily.     carvedilol 12.5 MG tablet  Commonly known as:  COREG  Take 1 tablet (12.5 mg total) by mouth 2 (two) times daily with a meal.     clopidogrel 75 MG tablet  Commonly known as:  PLAVIX  Take 1 tablet daily, beginning 1 week prior to procedure; start 1st dose on 02/15/13.     cyclobenzaprine 10 MG tablet  Commonly known as:  FLEXERIL  Take 1 tablet (10 mg total) by mouth 3 (three) times daily as needed for muscle spasms.     lisinopril-hydrochlorothiazide 10-12.5 MG per tablet  Commonly known as:  PRINZIDE,ZESTORETIC  Take 2 tablets by mouth daily.     lovastatin 20 MG tablet  Commonly known as:  MEVACOR  Take 1 tablet (20 mg total) by mouth at bedtime.     omeprazole 20 MG tablet  Commonly known as:  PRILOSEC OTC  Take 20 mg by mouth daily.     oxyCODONE-acetaminophen 5-325 MG per tablet  Commonly known as:  PERCOCET/ROXICET  Take 1-2 tablets by mouth every 4 (four) hours as needed.     ranitidine 300 MG tablet  Commonly known as:  ZANTAC  Take 1 tablet (300 mg total) by mouth at bedtime.         Disposition: home  Patient's condition: is Good  Follow up: 1. Dr.  Myra Gianotti in 4 weeks.   Doreatha Massed, PA-C [facility] [phone]  --- For [facility] use --- Instructions: Press F2 to tab through selections.  Delete question if not applicable.   Modified Rankin score at D/C (0-6): 0  IV medication needed for:  1. Hypertension: No 2. Hypotension: No  Post-op Complications: No  1. Post-op CVA or TIA: No  If yes: Event classification (right eye, left eye, right cortical, left  cortical, verterobasilar, other): n/a  If yes: Timing of event (intra-op, <6 hrs post-op, >=6 hrs post-op, unknown): n/a  2. CN injury: No  If yes: CN n/a injuried   3. Myocardial infarction: No  If yes: Dx by (EKG or clinical, Troponin): n/a  4.  CHF: No  5.  Dysrhythmia (new): No  6. Wound infection: No  7. Reperfusion symptoms: No  8. Return to OR: No  If yes: return to OR for (bleeding, neurologic, other CEA incision, other): n/a  Discharge medications: Statin use:  Yes If  No: [ ]  For Medical reasons, [ ]  Non-compliant, [ ]  Not-indicated ASA use:  Yes  If No: [ ]  For Medical reasons, [ ]  Non-compliant, [ ]  Not-indicated Beta blocker use:  Yes If No: [ ]  For Medical reasons, [ ]  Non-compliant, [ ]  Not-indicated ACE-Inhibitor use:  Yes If No: [ ]  For Medical reasons, [ ]  Non-compliant, [ ]  Not-indicated P2Y12 Antagonist use: yes, [x ] Plavix, [ ]  Plasugrel, [ ]  Ticlopinine, [ ]  Ticagrelor, [ ]  Other, [ ]  No for medical reason, [ ]  Non-compliant, [ ]  Not-indicated Anti-coagulant use:  no, [ ]  Warfarin, [ ]  Rivaroxaban, [ ]  Dabigatran, [ ]  Other, [ ]  No for medical reason, [ ]  Non-compliant, [ ]  Not-indicated

## 2013-02-23 NOTE — Telephone Encounter (Addendum)
Message copied by Fredrich Birks on Wed Feb 23, 2013  1:30 PM ------      Message from: [place], [place] K      Created: Wed Feb 23, 2013 10:39 AM      Regarding: schedule                   ----- Message -----         From: Dara Lords, PA-C         Sent: 02/23/2013   8:37 AM           To: Sharee Pimple, CMA            When he comes back for his appt in 4 weeks, he needs a carotid duplex, bilateral lower extremity duplex and ABI's.            Thanks,      Samantha ------  02/23/13: spoke with patient again to change dated and time to allow for ultrasounds, pt is aware, mailed letter, dpm

## 2013-02-25 ENCOUNTER — Telehealth: Payer: Self-pay

## 2013-02-25 NOTE — Telephone Encounter (Signed)
Phone call from pt.  Reports noticing increased bruising in right groin and right scrotal area since yesterday.  Describes as "a little swollen, and soft".  Denies pain.  Stated he walked around a lot yesterday, and questioned if he "over-did- it."   Pt. on Plavix and Aspirin, post (L) carotid stent.  Reassured pt. that with the blood thinners he is taking, and with the amt. Of pressure that is applied post-procedure, the bruising is not unusual.  Encouraged to continue to take it slow, and avoid doing anything strenuous for the next few days.  Advised to monitor for any signs if increased swelling, firmness, or tenderness of site.  Encouraged to call office with any worsening of symptoms, or any concerns.  Verb. understanding.

## 2013-03-01 ENCOUNTER — Telehealth: Payer: Self-pay

## 2013-03-01 ENCOUNTER — Encounter: Payer: Self-pay | Admitting: Vascular Surgery

## 2013-03-01 ENCOUNTER — Ambulatory Visit (INDEPENDENT_AMBULATORY_CARE_PROVIDER_SITE_OTHER): Payer: BC Managed Care – PPO | Admitting: Vascular Surgery

## 2013-03-01 DIAGNOSIS — I6529 Occlusion and stenosis of unspecified carotid artery: Secondary | ICD-10-CM

## 2013-03-01 NOTE — Telephone Encounter (Signed)
Phone call from pt. With c/o increased swelling in the right scrotum.  Stated it is "a little firm, tender, and pulls when I walk."  Denies fever/ chills.  Denies redness.  States it looks purple.  Denies any increase in swelling of right groin, from Friday, when initially reported.  States "it pulls so much when I walk, it feels like it could tear the skin."    Discussed with Dr. Arbie Cookey.  Advised to bring to office today for evaluation.  Offered appt. Today at 2:00 PM.  Agrees.

## 2013-03-01 NOTE — Progress Notes (Signed)
The patient has today for evaluation of his right groin. He called late last week and this morning with for concern regarding swelling and discomfort in his right groin and testicles. He is status post stenting of his left carotid artery on 02/22/2013 be a right groin approach.  Is in does have a fullness and thickening with bruising and also bruising into the scrotum. He does not have a great deal of hematoma in the scrotum. There is no pulsatile nature of this does not have any evidence of false aneurysm in his right foot is well-perfused  Impression and plan: Hematoma right groin status post right groin access for her left carotid stenting and Plavix use. He is having a great deal of discomfort and is requesting narcotics. He was written prescription for Percocet 10/31/2023 #40 no refills. He will keep his appointment as planned in one month with Dr. Myra Gianotti

## 2013-03-03 NOTE — Consult Note (Signed)
NAMEZEBEDIAH, Randy [name] NO.:  [id number]  MEDICAL RECORD NO.:  [id number]  LOCATION:  3S15C                        FACILITY:  [facility]  PHYSICIAN:  Jaquaveon Bilal K. Sostenes Kauffmann, M.D.DATE OF BIRTH:  10/27/1960  DATE OF CONSULTATION: DATE OF DISCHARGE:  02/23/2013                                CONSULTATION   CLINICAL HISTORY:  Progressive  internal carotid artery stenosis.  EXAMINATION:  Intracranial interpretation of left common carotid arteriogram before and after placement of stent assisted angioplasty of  the proximal left internal carotid artery with distal protection.  Left common carotid arteriogram before stent  placement demonstrates the left internal carotid artery to be patent.  There is suggestion of a focal stenosis of the left internal carotid artery proximal petrous segment.  Distal to this, the flow appears normal.  This  is  associated with approximately 50% stenosis of the left internal carotid artery in the petrous cavernous junction.  Mild atherosclerotic irregularity seen of the vessel distal to this.  The supraclinoid segment is widely patent.  The left middle and the left anterior cerebral arteries opacify normally into the capillary and venous phases.  Post stent placement with angioplasty demonstrates brisk  flow into the left internal carotid artery cervical segment with no change  in the  50% stenosis of the petrous cavernous segment.  __________ is also seen in the left middle and the left anterior cerebral artery into the capillary and venous phases.  No changes seen in the mild narrowing of the distal cervical segment of the left internal carotid artery.  IMPRESSION: 1. Pre  And  post left internal carotid artery angioplasty     demonstrating no  distal intracranial, intraluminal     filling defects or stenosis. 2. No change in the 50% stenosis of the left internal carotid artery petrous cavernous segment.    ______________________________ [place]ville Silos. Corliss Skains, M.D.    SKD/MEDQ  D:  03/02/2013  T:  03/02/2013  Job:  [id number]

## 2013-03-28 ENCOUNTER — Other Ambulatory Visit: Payer: BC Managed Care – PPO

## 2013-03-28 ENCOUNTER — Ambulatory Visit (INDEPENDENT_AMBULATORY_CARE_PROVIDER_SITE_OTHER): Payer: BC Managed Care – PPO | Admitting: Family Medicine

## 2013-03-28 ENCOUNTER — Ambulatory Visit: Payer: BC Managed Care – PPO | Admitting: Surgery

## 2013-03-28 ENCOUNTER — Encounter: Payer: Self-pay | Admitting: Family Medicine

## 2013-03-28 VITALS — BP 146/81 | HR 77 | Temp 97.9°F | Ht 70.0 in | Wt 176.1 lb

## 2013-03-28 DIAGNOSIS — Z789 Other specified health status: Secondary | ICD-10-CM

## 2013-03-28 DIAGNOSIS — F1010 Alcohol abuse, uncomplicated: Secondary | ICD-10-CM

## 2013-03-28 DIAGNOSIS — M545 Low back pain: Secondary | ICD-10-CM

## 2013-03-28 DIAGNOSIS — K219 Gastro-esophageal reflux disease without esophagitis: Secondary | ICD-10-CM | POA: Insufficient documentation

## 2013-03-28 MED ORDER — RANITIDINE HCL 300 MG PO TABS: 150.0000 mg | ORAL_TABLET | Freq: Two times a day (BID) | ORAL | Status: DC

## 2013-03-28 MED ORDER — CELECOXIB 100 MG PO CAPS: 100.0000 mg | ORAL_CAPSULE | Freq: Two times a day (BID) | ORAL | Status: DC

## 2013-03-28 NOTE — Assessment & Plan Note (Signed)
My first visit with pt. Multi-modal etiology, complicated with vasculopathy. Difficult to tease out component treatable by NSAID or other pain medication. Reluctant to treat with any sedating medication with history of polysubstance abuse and current drinking 10-12 beers per week.  P: Heating pad/massage Celebrex 100mg  BID trial Discussed plan to see back in 1 month, and at least 2 weeks after any surgery to assess chronic pain and healthcare maintenance.

## 2013-03-28 NOTE — Assessment & Plan Note (Signed)
CAGE negative (0/4). Reports cutting down from 12 beers/day to current 12 beers per week. Denied any social/professional problems. We discussed interactions with medications used to treat back pain.

## 2013-03-28 NOTE — Progress Notes (Deleted)
PROCEDURE NOTE: Shave biopsy of wart.   Procedure precepted with Dr. Armen Pickup.   Patient given informed consent, signed copy in the chart. Appropriate time out taken. Anal area on right buttock was prepped with iodine and 5 cc lidocaine 2% with epinephrine was injected. Once proper anesthesia was achieved, a razor blade was used to shave an area of skin approximately 1.5 cm by 1.5 cm.  Hemostasis achieved with gauze, and a sterile dressing was applied.  The specimen was sent for pathologic examination.  The patient tolerated the procedure well. Minimal bleeding controlled with steri-strips and gauze. Patient given post-procedure instructions and wound care supplies, and all questions were answered.   I will notify her of pathology results.

## 2013-03-28 NOTE — Patient Instructions (Addendum)
Thank you for coming in today!  Start taking Celebrex twice a day.  Continue heating pad, exercise as tolerated.  Come back and see me in a month and we'll see how celebrex has helped.   Please feel free to call with any questions or concerns at any time, at [phone]. - Dr. Jarvis Newcomer

## 2013-03-28 NOTE — Assessment & Plan Note (Signed)
Unsure of previous EGD but patient denies Dx ulcers. Changing zantac to 150mg  BID.

## 2013-03-28 NOTE — Progress Notes (Signed)
Patient ID: Randy [name], male   DOB: July 01, 1960, 52 y.o.   MRN: [id number] Subjective:  HPI:   Randy [name] is a 52 y.o. male presenting for evaluation of low back problems  Back Pain: Symptoms have been present for many years and include pain in the lumbar area midline and L > R (aching and throbbing in character; 10/10 in severity). Initial inciting event: none. Symptoms are constant throughout the day. Alleviating factors identifiable by patient are medication percocet and flexiril and heating pad. Reports taking 2 flexiril in the morning and one in the afternoon, and one percocet every morning. Exacerbating factors identifiable by patient are recumbency, sitting, standing and walking. Previous workup: Xray showing mild degenerative changes. Pt unable to exercise due to this and severe leg pain secondary to claudication. Pt to follow up for continued evaluation with vascular surgeon next week.   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   Pt with history of polysubstance abuse. Reports smoking cessation on July 17, no recent cravings, and has cut down from 12 beers per night to 12 beers per week. CAGE negative (0/4). Denies illicit drug use or history of misusing medications.   Review of Systems:  Per HPI. All other systems reviewed and are negative.    Objective:  Physical Exam: BP 146/81  Pulse 77  Temp(Src) 97.9 F (36.6 C) (Oral)  Ht 5\' 10"  (1.778 m)  Wt 176 lb 1.6 oz (79.878 kg)  BMI 25.27 kg/m2  Gen: 53 y.o. male in NAD HEENT: MMM, EOMI, PERRL CV: RRR, no MRG Resp: Non-labored, CTAB, no wheezes noted Abd: Soft, NTND, BS present, no guarding or organomegaly MSK: No edema noted, full ROM Neuro: Alert and oriented, CN II-XII grossly intact, speech normal Back:  Normal skin. Spine with normal alignment and no deformity. Marked tenderness to palpation along midline and L paraspinal musculature. Muscles without spasm.  Range of  motion is full at neck and diminished flexion at the lumbar sacral regions. Straight leg raise is positive at 90 degrees bilaterally. Neuro:  Sensation limited in L thigh and calf. Patellar and achilles DTR's 2+   Assessment:     Randy [name] is a 52 y.o. male presenting for evaluation of back pain and to establish therapeutic relationship.     Plan:     See problem list for problem-specific plans.  Total visit time was >59min, more than half of which was spent giving direct patient counseling.

## 2013-04-01 ENCOUNTER — Encounter: Payer: Self-pay | Admitting: Surgery

## 2013-04-04 ENCOUNTER — Ambulatory Visit (HOSPITAL_COMMUNITY)
Admission: RE | Admit: 2013-04-04 | Discharge: 2013-04-04 | Disposition: A | Discharge status: Home / Self Care | Payer: BC Managed Care – PPO | Source: Ambulatory Visit | Attending: Surgery | Admitting: Surgery

## 2013-04-04 ENCOUNTER — Ambulatory Visit (INDEPENDENT_AMBULATORY_CARE_PROVIDER_SITE_OTHER): Payer: BC Managed Care – PPO | Admitting: Surgery

## 2013-04-04 ENCOUNTER — Ambulatory Visit (INDEPENDENT_AMBULATORY_CARE_PROVIDER_SITE_OTHER)
Admission: RE | Admit: 2013-04-04 | Discharge: 2013-04-04 | Disposition: A | Discharge status: Home / Self Care | Payer: BC Managed Care – PPO | Source: Ambulatory Visit | Attending: Surgery | Admitting: Surgery

## 2013-04-04 ENCOUNTER — Encounter: Payer: Self-pay | Admitting: Surgery

## 2013-04-04 DIAGNOSIS — I6529 Occlusion and stenosis of unspecified carotid artery: Secondary | ICD-10-CM

## 2013-04-04 DIAGNOSIS — Z01810 Encounter for preprocedural cardiovascular examination: Secondary | ICD-10-CM

## 2013-04-04 DIAGNOSIS — Z951 Presence of aortocoronary bypass graft: Secondary | ICD-10-CM | POA: Insufficient documentation

## 2013-04-04 DIAGNOSIS — Z48812 Encounter for surgical aftercare following surgery on the circulatory system: Secondary | ICD-10-CM

## 2013-04-04 DIAGNOSIS — I739 Peripheral vascular disease, unspecified: Secondary | ICD-10-CM

## 2013-04-04 DIAGNOSIS — M79609 Pain in unspecified limb: Secondary | ICD-10-CM

## 2013-04-04 NOTE — Progress Notes (Signed)
Vascular and Vein Specialist of [facility]   Patient name: Randy [name] MRN: [id number] DOB: 01/15/1961 Sex: male     Chief Complaint  Patient presents with  . Re-evaluation    pt c/o L LE pain/burning - s/p L carotid stent placement states     HISTORY OF PRESENT ILLNESS: The patient comes in today for continued followup of his lower extremity claudication. He recently underwent left carotid stenting with distal embolic protection on 02/22/2013. Previously he had an attempt at carotid endarterectomy. I never opened the carotid artery because of the calcification it went up to the skull base. He has done well from the carotid stenting perspective. His biggest complaint today is that of leg pain. This is been gone on for several months. His left leg bothers him more so than the right. He experiences cramps in his left thigh at approximately 50 feet. He also will have right calf cramping with prolonged ambulation. His symptoms are alleviated with rest. He denies rest pain or ulceration.  Past Medical History  Diagnosis Date  . Hypertension   . CAD (coronary artery disease)   . Hx of CABG 2010  . Polysubstance abuse   . Abnormal heart rhythm   . Heart attack 2010  . Hyperlipidemia   . Lung nodule   . Irregular heartbeat   . Shortness of breath     when lying flat on back  . PVD (peripheral vascular disease)   . AAA (abdominal aortic aneurysm) July 2013  . Carotid artery occlusion   . GERD (gastroesophageal reflux disease)   . Anxiety     Past Surgical History  Procedure Laterality Date  . Coronary artery bypass graft  2010  . Endarterectomy Left 01/13/2013    Procedure: ATTEMPTED ENDARTERECTOMY CAROTID;  Surgeon: Nada Libman, MD;  Location: [facility] OR;  Service: Vascular;  Laterality: Left;  . Carotid endarterectomy Left 01-13-13    Attempted cea    History   Social History  . Marital Status: Widowed    Spouse Name: N/A    Number of Children: Y  . Years of Education: N/A    Occupational History  . not working.     Social History Main Topics  . Smoking status: Former Smoker -- 0.25 packs/day for 30 years    Types: Cigarettes    Quit date: 01/13/2013  . Smokeless tobacco: Never Used  . Alcohol Use: Yes     Comment: occasionally  . Drug Use: No  . Sexual Activity: Not on file   Other Topics Concern  . Not on file   Social History Narrative   Pt is widowed and lives alone.          Family History  Problem Relation Age of Onset  . Asthma Mother   . Diabetes Mother   . Hyperlipidemia Mother   . Hypertension Mother   . Lung cancer Father   . Cancer Father   . Stroke Sister     Allergies as of 04/04/2013  . (No Known Allergies)    Current Outpatient Prescriptions on File Prior to Visit  Medication Sig Dispense Refill  . aspirin 81 MG tablet Take 81 mg by mouth daily.       . carvedilol (COREG) 12.5 MG tablet Take 1 tablet (12.5 mg total) by mouth 2 (two) times daily with a meal.  60 tablet  3  . celecoxib (CELEBREX) 100 MG capsule Take 1 capsule (100 mg total) by mouth 2 (two) times daily.  60 capsule  1  . clopidogrel (PLAVIX) 75 MG tablet Take 1 tablet daily, beginning 1 week prior to procedure; start 1st dose on 02/15/13.  30 tablet  11  . cyclobenzaprine (FLEXERIL) 10 MG tablet Take 1 tablet (10 mg total) by mouth 3 (three) times daily as needed for muscle spasms.  30 tablet  0  . lisinopril-hydrochlorothiazide (PRINZIDE,ZESTORETIC) 10-12.5 MG per tablet Take 2 tablets by mouth daily.  30 tablet  3  . lovastatin (MEVACOR) 20 MG tablet Take 1 tablet (20 mg total) by mouth at bedtime.  90 tablet  3  . oxyCODONE-acetaminophen (PERCOCET/ROXICET) 5-325 MG per tablet Take 1-2 tablets by mouth every 4 (four) hours as needed.  30 tablet  0  . ranitidine (ZANTAC) 300 MG tablet Take 0.5 tablets (150 mg total) by mouth 2 (two) times daily.  90 tablet  1   No current facility-administered medications on file prior to visit.     REVIEW OF  SYSTEMS: No changes from prior visit  PHYSICAL EXAMINATION:   Vital signs are BP 121/84  Pulse 61  Ht 5\' 10"  (1.778 m)  Wt 188 lb 3.2 oz (85.367 kg)  BMI 27 kg/m2  SpO2 100% General: The patient appears their stated age. HEENT:  No gross abnormalities Pulmonary:  Non labored breathing Musculoskeletal: There are no major deformities. Neurologic: No focal weakness or paresthesias are detected, Skin: There are no ulcer or rashes noted. Psychiatric: The patient has normal affect. Cardiovascular: There is a regular rate and rhythm without significant murmur appreciated. Pedal pulses are nonpalpable   Diagnostic Studies Carotid duplex: Left carotid stent is widely patent. Lower extremity duplex shows a high-grade stenosis within the mid right superficial femoral artery. The patient has monophasic left sided waveforms beginning in the common femoral artery, suggesting aortoiliac disease. ABI on the right is 0.83. On the left is 0.64  Assessment: #1: Carotid stenosis #2 lower extremity peripheral vascular disease  Plan: #1: The patient's carotid stent is widely patent. He will continue with routine surveillance in 6 months. #2: The patient's lower extremity symptoms are to the point where intervention is needed. He has quit smoking. His left leg is the most worrisome. My plan will be for ultrasound access in the left groin, performing an abdominal aortogram with bilateral runoff. He most likely has inflow disease on the left which could be stented. This will also enable me to potentially treat the right leg at the same time. This procedure is been scheduled for October 21  V. Charlena Cross, M.D. V[facility] Vein Specialists of [place] Office: [phone] Pager:  [phone]

## 2013-04-07 ENCOUNTER — Other Ambulatory Visit: Payer: Self-pay

## 2013-04-12 NOTE — Progress Notes (Signed)
I agree with the above  Randy [name] 

## 2013-04-18 ENCOUNTER — Encounter (HOSPITAL_COMMUNITY): Payer: Self-pay | Admitting: Pharmacy Technician

## 2013-04-19 ENCOUNTER — Ambulatory Visit (HOSPITAL_COMMUNITY)
Admission: RE | Admit: 2013-04-19 | Discharge: 2013-04-19 | Disposition: A | Discharge status: Home / Self Care | Payer: BC Managed Care – PPO | Source: Ambulatory Visit | Attending: Surgery | Admitting: Surgery

## 2013-04-19 ENCOUNTER — Encounter (HOSPITAL_COMMUNITY)
Admission: RE | Disposition: A | Discharge status: Home / Self Care | Payer: Self-pay | Source: Ambulatory Visit | Attending: Surgery

## 2013-04-19 ENCOUNTER — Telehealth: Payer: Self-pay | Admitting: Surgery

## 2013-04-19 DIAGNOSIS — I70219 Atherosclerosis of native arteries of extremities with intermittent claudication, unspecified extremity: Secondary | ICD-10-CM

## 2013-04-19 DIAGNOSIS — I714 Abdominal aortic aneurysm, without rupture, unspecified: Secondary | ICD-10-CM | POA: Insufficient documentation

## 2013-04-19 DIAGNOSIS — I7092 Chronic total occlusion of artery of the extremities: Secondary | ICD-10-CM | POA: Insufficient documentation

## 2013-04-19 DIAGNOSIS — Z01812 Encounter for preprocedural laboratory examination: Secondary | ICD-10-CM | POA: Insufficient documentation

## 2013-04-19 HISTORY — PX: ABDOMINAL AORTAGRAM: SHX5454

## 2013-04-19 LAB — POCT I-STAT, CHEM 8
BUN: 21 mg/dL (ref 6–23)
Calcium, Ion: 1.23 mmol/L (ref 1.12–1.23)
Chloride: 102 mEq/L (ref 96–112)
Glucose, Bld: 97 mg/dL (ref 70–99)
HCT: 45 % (ref 39.0–52.0)
Potassium: 4.4 mEq/L (ref 3.5–5.1)

## 2013-04-19 LAB — POCT ACTIVATED CLOTTING TIME: Activated Clotting Time: 176 seconds

## 2013-04-19 SURGERY — ABDOMINAL AORTAGRAM
Anesthesia: LOCAL

## 2013-04-19 MED ORDER — METOPROLOL TARTRATE 1 MG/ML IV SOLN: 2.0000 mg | INTRAVENOUS | Status: DC | PRN

## 2013-04-19 MED ORDER — LABETALOL HCL 5 MG/ML IV SOLN: 10.0000 mg | INTRAVENOUS | Status: DC | PRN

## 2013-04-19 MED ORDER — HYDRALAZINE HCL 20 MG/ML IJ SOLN: 10.0000 mg | INTRAMUSCULAR | Status: DC | PRN

## 2013-04-19 MED ORDER — MORPHINE SULFATE 2 MG/ML IJ SOLN
INTRAMUSCULAR | Status: AC
  Filled 2013-04-19: qty 1

## 2013-04-19 MED ORDER — HEPARIN SODIUM (PORCINE) 1000 UNIT/ML IJ SOLN
INTRAMUSCULAR | Status: AC
  Filled 2013-04-19: qty 1

## 2013-04-19 MED ORDER — ALUM & MAG HYDROXIDE-SIMETH 200-200-20 MG/5ML PO SUSP: 15.0000 mL | ORAL | Status: DC | PRN

## 2013-04-19 MED ORDER — FENTANYL CITRATE 0.05 MG/ML IJ SOLN
INTRAMUSCULAR | Status: AC
  Filled 2013-04-19: qty 2

## 2013-04-19 MED ORDER — OXYCODONE HCL 5 MG PO TABS
5.0000 mg | ORAL_TABLET | ORAL | Status: DC | PRN
  Administered 2013-04-19: 10 mg via ORAL
  Filled 2013-04-19: qty 2

## 2013-04-19 MED ORDER — MIDAZOLAM HCL 2 MG/2ML IJ SOLN
INTRAMUSCULAR | Status: AC
  Filled 2013-04-19: qty 2

## 2013-04-19 MED ORDER — GUAIFENESIN-DM 100-10 MG/5ML PO SYRP: 15.0000 mL | ORAL_SOLUTION | ORAL | Status: DC | PRN

## 2013-04-19 MED ORDER — LIDOCAINE HCL (PF) 1 % IJ SOLN
INTRAMUSCULAR | Status: AC
  Filled 2013-04-19: qty 30

## 2013-04-19 MED ORDER — ACETAMINOPHEN 325 MG RE SUPP: 325.0000 mg | RECTAL | Status: DC | PRN

## 2013-04-19 MED ORDER — MORPHINE SULFATE 10 MG/ML IJ SOLN: 2.0000 mg | INTRAMUSCULAR | Status: DC | PRN

## 2013-04-19 MED ORDER — ONDANSETRON HCL 4 MG/2ML IJ SOLN: 4.0000 mg | Freq: Four times a day (QID) | INTRAMUSCULAR | Status: DC | PRN

## 2013-04-19 MED ORDER — ACETAMINOPHEN 325 MG PO TABS: 325.0000 mg | ORAL_TABLET | ORAL | Status: DC | PRN

## 2013-04-19 MED ORDER — PHENOL 1.4 % MT LIQD: 1.0000 | OROMUCOSAL | Status: DC | PRN

## 2013-04-19 MED ORDER — HEPARIN (PORCINE) IN NACL 2-0.9 UNIT/ML-% IJ SOLN
INTRAMUSCULAR | Status: AC
  Filled 2013-04-19: qty 1000

## 2013-04-19 MED ORDER — SODIUM CHLORIDE 0.9 % IV SOLN: 1.0000 mL/kg/h | INTRAVENOUS | Status: DC

## 2013-04-19 MED ORDER — SODIUM CHLORIDE 0.9 % IV SOLN
INTRAVENOUS | Status: DC
  Administered 2013-04-19: 07:00:00 via INTRAVENOUS

## 2013-04-19 MED ORDER — LABETALOL HCL 5 MG/ML IV SOLN
INTRAVENOUS | Status: AC
  Filled 2013-04-19: qty 4

## 2013-04-19 NOTE — Telephone Encounter (Addendum)
Message copied by Fredrich Birks on Tue Apr 19, 2013 11:44 AM ------      Message from: Melene Plan      Created: Tue Apr 19, 2013 10:48 AM                   ----- Message -----         From: Nada Libman, MD         Sent: 04/19/2013   9:19 AM           To: Reuel Derby, Melene Plan, RN            04/19/2013:                  Surgeon:  [name]      Procedure Performed:       1.  ultrasound-guided access, left femoral artery       2.  ultrasound-guided access, right femoral artery       3.  abdominal aortogram       4.   Bilateral lower extremity runoff       5.  failed attempt at stenting of the left common iliac artery                  Please have the patient followup in the office within the next one to 2 weeks ------  04/19/13: lm for pt, dpm

## 2013-04-19 NOTE — Op Note (Signed)
Vascular and Vein Specialists of [facility]  Patient name: Randy [name] MRN: [id number] DOB: 23-Jul-1960 Sex: male  04/19/2013 Pre-operative Diagnosis: Bilateral claudication Post-operative diagnosis:  Same Surgeon:  [name] Procedure Performed:  1.  ultrasound-guided access, left femoral artery  2.  ultrasound-guided access, right femoral artery  3.  abdominal aortogram  4.   Bilateral lower extremity runoff  5.  failed attempt at stenting of the left common iliac artery   Indications:  The patient comes in for further evaluation and possible treatment of bilateral claudication, left greater than right  Procedure:  The patient was identified in the holding area and taken to room 8.  The patient was then placed supine on the table and prepped and draped in the usual sterile fashion.  A time out was called.  Ultrasound was used to evaluate the left common femoral artery.  It was patent .  A digital ultrasound image was acquired.  A micropuncture needle was used to access the left common femoral artery under ultrasound guidance.  An 018 wire was advanced without resistance and a micropuncture sheath was placed.  The 018 wire was removed and a benson wire was placed.  The micropuncture sheath was exchanged for a 5 french sheath.  I could not get the wire to advance into the aorta.  A Kumpe catheter was inserted.  A contrast injection was performed with a Kumpe catheter which revealed a left common iliac occlusion. After this finding, I elected to gain access into the right common femoral artery for better diagnostic imaging.  Under ultrasound guidance the right common femoral artery was cannulated with a micropuncture needle after local anesthesia was infiltrated.  An 018 wire was advanced without resistance into the aorta and a micropuncture sheath was placed.  A Benson wire followed and a 5 French sheath was inserted.  An omni-flush catheter was advanced to the level of L1 and an  abdominal aortogram was performed.  The catheter was then pulled down to the aortic bifurcation and bilateral lower extremity runoff was performed.  Findings:   Aortogram:  No significant renal artery stenosis is identified.  Diffuse irregularity is visualized within the infrarenal abdominal aorta.  Aneurysmal degeneration is also visualized.  The right common, external, and internal iliac arteries are widely patent.  The left common iliac artery is occluded just beyond its origin.  There is reconstitution at the level of the hypogastric artery.  The left external iliac artery appears to be widely patent.  Right Lower Extremity:  The right common femoral and profunda femoral arteries are widely patent.  The superficial femoral artery is patent however there is approximately a 99% short segment stenosis vs. occlusion at the adductor canal.  The popliteal artery is patent throughout it's course.  Two-vessel runoff via the peroneal and posterior tibial artery are encountered.  Left Lower Extremity:  The left common femoral artery is patent throughout it's course.   The profunda femoral artery is patent.  The superficial femoral artery is occluded with reconstitution of the above-knee popliteal artery.  Tibial vessel evaluation is limited secondary to proximal disease.  There appears to be a posterior tibial artery down to the ankle.  Intervention:  After the above images were acquired, I tried to cross the total occlusion in the left common iliac artery.  5000 units of heparin were administered.  I used a Glidewire and a quick cross from the left-sided sheath.  I was unable to gain reentry across the lesion.  I then elected to attempt to cross the lesion from the right side.  I initially used an omni-flush catheter but could not push the wire through the occlusion.  I then 5 to advance a quick cross catheter over the bifurcation, however there was not enough support to assist with crossing the lesion.  I then  tried a Motarjame catheter and again tried to cross the lesion without success.  After spending approximately 45 minutes try to cross the lesion which I would have ultimately stented, I elected to terminate the procedure and consider surgical revascularization.  Catheters and wires were removed.  The patient be taken to this holding area for sheath pull.  Impression:  #1  aneurysmal changes identified within the infrarenal abdominal aorta  #2  occluded left common iliac artery  #3  high-grade right superficial femoral artery stenosis vs. occlusion at the adductor canal  #4  occluded left superficial femoral artery  #5  posterior tibial and peroneal runoff on the right with posterior tibial runoff on the left.  Tibial vessel evaluation was severely limited to 2 proximal disease.  The patient was brought back for discussions of aortobifemoral bypass   V. Durene Cal, M.D. [facility] Vein Specialists of [place] Office: [phone] Pager:  [phone]

## 2013-04-20 ENCOUNTER — Ambulatory Visit: Payer: BC Managed Care – PPO | Admitting: Family Medicine

## 2013-04-28 ENCOUNTER — Telehealth: Payer: Self-pay

## 2013-04-28 DIAGNOSIS — I739 Peripheral vascular disease, unspecified: Secondary | ICD-10-CM

## 2013-04-28 MED ORDER — OXYCODONE HCL 5 MG PO TABS: ORAL_TABLET | ORAL | Status: DC

## 2013-04-28 NOTE — Telephone Encounter (Addendum)
Phone call from pt.  Requesting refill of Oxycodone and Flexeril.  States he has a "burning" in the left lower leg; describes that "it burns like a wildfire".  Reports swelling and brusing in the left groin; states "this is how it was with my procedure in August, when he went in my right groin."  States the swelling has moved down in the scrotal area.  Describes as purple/black in color, and soft.  Advised to position hips/ pelvic area so it is elevated above the level of the heart, to reduce the swelling.  Notified Dr. Myra Gianotti about pt's request for refill of Oxycodone and Flexeril.  Advised may give pt. Oxycodone 5 mg IR tabs, take 1 tab, po. q 4 hrs / prn; # 30; no refills.  Notified pt. that an Rx will be available to pick up at the front desk.  States he will pick up the Rx tomorrow.

## 2013-05-06 ENCOUNTER — Encounter: Payer: Self-pay | Admitting: Surgery

## 2013-05-09 ENCOUNTER — Encounter: Payer: Self-pay | Admitting: Surgery

## 2013-05-09 ENCOUNTER — Ambulatory Visit (INDEPENDENT_AMBULATORY_CARE_PROVIDER_SITE_OTHER): Payer: BC Managed Care – PPO | Admitting: Surgery

## 2013-05-09 VITALS — BP 168/86 | HR 60 | Ht 70.0 in | Wt 188.0 lb

## 2013-05-09 DIAGNOSIS — I739 Peripheral vascular disease, unspecified: Secondary | ICD-10-CM

## 2013-05-09 HISTORY — DX: Peripheral vascular disease, unspecified: I73.9

## 2013-05-09 NOTE — Progress Notes (Signed)
Vascular and Vein Specialist of [facility]   Patient name: Randy [name] MRN: [id number] DOB: Jul 31, 1960 Sex: male     Chief Complaint  Patient presents with  . Re-evaluation    2 wk f/u s/p aortogram     HISTORY OF PRESENT ILLNESS: The patient is back today for discussions regarding his claudication.  He is well known to me, having undergone right carotid endarterectomy and left carotid stenting.  He suffers from lifestyle limiting claudication, particularly in his left leg at approximately one block.  He recently underwent angiography which revealed an occluded left common iliac artery as well as an occluded left superficial femoral artery and a high-grade stenosis vs. occlusion within the distal right superficial femoral artery.  He denies ulcers or rest pain.  Past Medical History  Diagnosis Date  . Hypertension   . CAD (coronary artery disease)   . Hx of CABG 2010  . Polysubstance abuse   . Abnormal heart rhythm   . Heart attack 2010  . Hyperlipidemia   . Lung nodule   . Irregular heartbeat   . Shortness of breath     when lying flat on back  . PVD (peripheral vascular disease)   . AAA (abdominal aortic aneurysm) July 2013  . Carotid artery occlusion   . GERD (gastroesophageal reflux disease)   . Anxiety     Past Surgical History  Procedure Laterality Date  . Coronary artery bypass graft  2010  . Endarterectomy Left 01/13/2013    Procedure: ATTEMPTED ENDARTERECTOMY CAROTID;  Surgeon: Nada Libman, MD;  Location: [facility] OR;  Service: Vascular;  Laterality: Left;  . Carotid endarterectomy Left 01-13-13    Attempted cea    History   Social History  . Marital Status: Widowed    Spouse Name: N/A    Number of Children: Y  . Years of Education: N/A   Occupational History  . not working.     Social History Main Topics  . Smoking status: Former Smoker -- 0.25 packs/day for 30 years    Types: Cigarettes    Quit date: 01/13/2013  . Smokeless tobacco: Never Used  .  Alcohol Use: Yes     Comment: occasionally  . Drug Use: No  . Sexual Activity: Not on file   Other Topics Concern  . Not on file   Social History Narrative   Pt is widowed and lives alone.          Family History  Problem Relation Age of Onset  . Asthma Mother   . Diabetes Mother   . Hyperlipidemia Mother   . Hypertension Mother   . Lung cancer Father   . Cancer Father   . Stroke Sister     Allergies as of 05/09/2013  . (No Known Allergies)    Current Outpatient Prescriptions on File Prior to Visit  Medication Sig Dispense Refill  . aspirin EC 81 MG tablet Take 81 mg by mouth daily.      . carvedilol (COREG) 12.5 MG tablet Take 1 tablet (12.5 mg total) by mouth 2 (two) times daily with a meal.  60 tablet  3  . celecoxib (CELEBREX) 100 MG capsule Take 1 capsule (100 mg total) by mouth 2 (two) times daily.  60 capsule  1  . clopidogrel (PLAVIX) 75 MG tablet Take 1 tablet daily, beginning 1 week prior to procedure; start 1st dose on 02/15/13.  30 tablet  11  . cyclobenzaprine (FLEXERIL) 10 MG tablet Take 1 tablet (10  mg total) by mouth 3 (three) times daily as needed for muscle spasms.  30 tablet  0  . lisinopril-hydrochlorothiazide (PRINZIDE,ZESTORETIC) 10-12.5 MG per tablet Take 2 tablets by mouth daily.  30 tablet  3  . lovastatin (MEVACOR) 20 MG tablet Take 1 tablet (20 mg total) by mouth at bedtime.  90 tablet  3  . oxyCODONE (OXY IR/ROXICODONE) 5 MG immediate release tablet Take 1 tab, po, q 4 hrs. prn/ pain  30 tablet  0  . ranitidine (ZANTAC) 300 MG tablet Take 0.5 tablets (150 mg total) by mouth 2 (two) times daily.  90 tablet  1   No current facility-administered medications on file prior to visit.     REVIEW OF SYSTEMS: No changes from prior visit  PHYSICAL EXAMINATION:   Vital signs are BP 168/86  Pulse 60  Ht 5\' 10"  (1.778 m)  Wt 188 lb (85.276 kg)  BMI 26.98 kg/m2  SpO2 98% General: The patient appears their stated age. HEENT:  No gross  abnormalities Pulmonary:  Non labored breathing Abdomen: Soft and non-tender Musculoskeletal: There are no major deformities. Neurologic: No focal weakness or paresthesias are detected, Skin: There are no ulcer or rashes noted. Psychiatric: The patient has normal affect.    Diagnostic Studies None  Assessment: Bilateral claudication, left greater than right Plan: I spent approximately 30 minutes discussing the patient's angiogram and treatment options.  We discussed proceeding with an aortobifemoral bypass graft to help with his claudication symptoms.  This would also treat his small aneurysm within his aorta. I would not address his left superficial femoral artery stenosis or right superficial femoral artery stenosis at the time of his aortobifem.  We discussed the risks and benefits of the operation as well as the recovery period.  I talked about the risk of sexual dysfunction, incisional hernia, cardiopulmonary complications.  The patient like to discuss this with his daughter and contact me for scheduling.  He will need to be off of his Plavix for 5 days prior to his operation.  [name], M.D. [facility] Specialists of [place] Office: [phone] Pager:  [phone]

## 2013-05-10 ENCOUNTER — Telehealth: Payer: Self-pay | Admitting: Family Medicine

## 2013-05-10 DIAGNOSIS — I10 Essential (primary) hypertension: Secondary | ICD-10-CM

## 2013-05-10 MED ORDER — LISINOPRIL-HYDROCHLOROTHIAZIDE 10-12.5 MG PO TABS: 2.0000 | ORAL_TABLET | Freq: Every day | ORAL | Status: DC

## 2013-05-10 NOTE — Telephone Encounter (Signed)
Pt called and needs to have a refill on his lisinopril jw

## 2013-05-10 NOTE — Telephone Encounter (Signed)
Refill sent electronically. Thanks.

## 2013-05-11 ENCOUNTER — Encounter: Payer: Self-pay | Admitting: Family Medicine

## 2013-05-11 ENCOUNTER — Ambulatory Visit (INDEPENDENT_AMBULATORY_CARE_PROVIDER_SITE_OTHER): Payer: BC Managed Care – PPO | Admitting: Family Medicine

## 2013-05-11 VITALS — BP 170/103 | HR 71 | Temp 98.4°F | Wt 188.0 lb

## 2013-05-11 DIAGNOSIS — M5137 Other intervertebral disc degeneration, lumbosacral region: Secondary | ICD-10-CM

## 2013-05-11 DIAGNOSIS — I10 Essential (primary) hypertension: Secondary | ICD-10-CM

## 2013-05-11 DIAGNOSIS — K219 Gastro-esophageal reflux disease without esophagitis: Secondary | ICD-10-CM

## 2013-05-11 DIAGNOSIS — M5380 Other specified dorsopathies, site unspecified: Secondary | ICD-10-CM

## 2013-05-11 DIAGNOSIS — M62830 Muscle spasm of back: Secondary | ICD-10-CM

## 2013-05-11 MED ORDER — LISINOPRIL-HYDROCHLOROTHIAZIDE 20-25 MG PO TABS: 1.0000 | ORAL_TABLET | Freq: Every day | ORAL | Status: DC

## 2013-05-11 MED ORDER — RANITIDINE HCL 300 MG PO TABS: 300.0000 mg | ORAL_TABLET | Freq: Two times a day (BID) | ORAL | Status: DC

## 2013-05-11 MED ORDER — CYCLOBENZAPRINE HCL 10 MG PO TABS: 10.0000 mg | ORAL_TABLET | Freq: Three times a day (TID) | ORAL | Status: DC | PRN

## 2013-05-11 NOTE — Patient Instructions (Addendum)
Thank you for coming in today.  KEEP taking RANITIDINE, but take a whole pill twice a day.   As we spoke about, I think the flexeril can really help you when you need it, and only when you need it.  They should be taken for a few days following your back going out, and not everyday.  Use caution if taking it and operating a vehicle or other heavy machinery.  I expect these to last more than 2 months.   As you leave, make an appointment to follow up with me in 6 months.    Take care and seek immediate care sooner than 6 months if you develop any concerns.  Please feel free to call with any questions or concerns at any time, at [phone]. - Dr. Jarvis Newcomer

## 2013-05-14 NOTE — Assessment & Plan Note (Signed)
PPI may interfere with plavix. Continuing zantac at 300mg  BID. Encouraged patient not to take baking soda at night, but reviewed lifestyle modifications.

## 2013-05-14 NOTE — Assessment & Plan Note (Signed)
Medications refilled

## 2013-05-14 NOTE — Progress Notes (Signed)
Patient ID: [name], male   DOB: [birth year], 52 y.o.   MRN: [id number] Subjective:  HPI:   Randy [name] is a 52 y.o. male with h/o HTN, CAD s/p CABG, PVD s/p CEA, AAA, lumbar DJD, and tobacco use is here for back pain and medicine refills.   Randy [name] has chronic back pain that limits his ability to function some days. He reports "throwing his back out" usually by rotation while lifting. Episodes of exacerbation last several days during which he lays in bed doing nothing. He has tried tylenol and NSAIDs without relief. He has had oxycodone which does help and says flexeril also helps very much. He has taken these 3 times per day in the past, everyday. Pain is worse when sitting upright. He denies saddle anesthesia, urinary/bowel incontinence.   Review of Systems:  Per HPI. All other systems reviewed and are negative.    Objective:  Physical Exam: BP 170/103  Pulse 71  Temp(Src) 98.4 F (36.9 C) (Oral)  Wt 188 lb (85.276 kg)  Gen: 52 y.o. male in NAD HEENT: MMM, oropharynx clear, poor dentition.  CV: RRR, no MRG Resp: Non-labored, CTAB, diminished air movement bilaterally Abd: Soft, NTND, BS present, no guarding or organomegaly MSK: Back flexion mildly limited, extension moderately limited, no midline tenderness, paraspinal spasm noted in lower thoracic/lumber region.  Neuro: Alert and oriented, CN II-XII grossly intact, speech normal    Assessment:     Randy [name] is a 52 y.o. male with history of HTN, CAD s/p CABG, PVD s/p CEA, AAA, lumbar DJD, and tobacco use is here for back pain and medicine refills.    Plan:  Meds refilled for HTN, GERD Flu shot today.  Flexeril 10mg  #30 expected to last > 2 months.   See problem list for problem-specific plans.

## 2013-05-14 NOTE — Assessment & Plan Note (Signed)
Do not think opioid analgesic or muscle relaxant are long-term solutions, but did prescribe flexeril 10mg  #30 for sparing use only as needed. Expect this quantity to last > 2 months.

## 2013-05-27 ENCOUNTER — Other Ambulatory Visit: Payer: Self-pay | Admitting: Family Medicine

## 2013-06-22 ENCOUNTER — Telehealth: Payer: Self-pay | Admitting: *Deleted

## 2013-06-22 NOTE — Telephone Encounter (Signed)
I called to check with Randy [name] about scheduling his Aortobifem BPG with Dr Myra Gianotti. He wants to wait to schedule sometime after he gets everything caught up after the first of the year. He said he would call us back to schedule.

## 2013-10-22 ENCOUNTER — Encounter (HOSPITAL_COMMUNITY): Payer: Self-pay | Admitting: Emergency Medicine

## 2013-10-22 ENCOUNTER — Emergency Department (HOSPITAL_COMMUNITY): Payer: 59

## 2013-10-22 ENCOUNTER — Observation Stay (HOSPITAL_COMMUNITY)
Admission: EM | Admit: 2013-10-22 | Discharge: 2013-10-23 | Disposition: A | Discharge status: Home / Self Care | Payer: 59 | Attending: Emergency Medicine | Admitting: Emergency Medicine

## 2013-10-22 DIAGNOSIS — I739 Peripheral vascular disease, unspecified: Secondary | ICD-10-CM

## 2013-10-22 DIAGNOSIS — Z7902 Long term (current) use of antithrombotics/antiplatelets: Secondary | ICD-10-CM | POA: Insufficient documentation

## 2013-10-22 DIAGNOSIS — Z7982 Long term (current) use of aspirin: Secondary | ICD-10-CM | POA: Insufficient documentation

## 2013-10-22 DIAGNOSIS — I2510 Atherosclerotic heart disease of native coronary artery without angina pectoris: Secondary | ICD-10-CM | POA: Insufficient documentation

## 2013-10-22 DIAGNOSIS — I252 Old myocardial infarction: Secondary | ICD-10-CM | POA: Insufficient documentation

## 2013-10-22 DIAGNOSIS — R52 Pain, unspecified: Secondary | ICD-10-CM | POA: Insufficient documentation

## 2013-10-22 DIAGNOSIS — I10 Essential (primary) hypertension: Secondary | ICD-10-CM | POA: Insufficient documentation

## 2013-10-22 DIAGNOSIS — M545 Low back pain, unspecified: Secondary | ICD-10-CM

## 2013-10-22 DIAGNOSIS — E785 Hyperlipidemia, unspecified: Secondary | ICD-10-CM

## 2013-10-22 DIAGNOSIS — Z79899 Other long term (current) drug therapy: Secondary | ICD-10-CM | POA: Insufficient documentation

## 2013-10-22 DIAGNOSIS — F1910 Other psychoactive substance abuse, uncomplicated: Secondary | ICD-10-CM

## 2013-10-22 DIAGNOSIS — R630 Anorexia: Secondary | ICD-10-CM | POA: Insufficient documentation

## 2013-10-22 DIAGNOSIS — R0789 Other chest pain: Principal | ICD-10-CM | POA: Insufficient documentation

## 2013-10-22 DIAGNOSIS — Z720 Tobacco use: Secondary | ICD-10-CM

## 2013-10-22 DIAGNOSIS — Z87891 Personal history of nicotine dependence: Secondary | ICD-10-CM | POA: Insufficient documentation

## 2013-10-22 DIAGNOSIS — Z789 Other specified health status: Secondary | ICD-10-CM

## 2013-10-22 DIAGNOSIS — R079 Chest pain, unspecified: Secondary | ICD-10-CM | POA: Diagnosis present

## 2013-10-22 DIAGNOSIS — M5137 Other intervertebral disc degeneration, lumbosacral region: Secondary | ICD-10-CM | POA: Diagnosis present

## 2013-10-22 DIAGNOSIS — F411 Generalized anxiety disorder: Secondary | ICD-10-CM | POA: Insufficient documentation

## 2013-10-22 DIAGNOSIS — K219 Gastro-esophageal reflux disease without esophagitis: Secondary | ICD-10-CM | POA: Insufficient documentation

## 2013-10-22 DIAGNOSIS — Z951 Presence of aortocoronary bypass graft: Secondary | ICD-10-CM | POA: Insufficient documentation

## 2013-10-22 DIAGNOSIS — I25810 Atherosclerosis of coronary artery bypass graft(s) without angina pectoris: Secondary | ICD-10-CM

## 2013-10-22 LAB — BASIC METABOLIC PANEL
BUN: 12 mg/dL (ref 6–23)
CO2: 23 mEq/L (ref 19–32)
Calcium: 9.1 mg/dL (ref 8.4–10.5)
Chloride: 98 mEq/L (ref 96–112)
Creatinine, Ser: 0.82 mg/dL (ref 0.50–1.35)
GFR calc Af Amer: >90 mL/min (ref >90)
GFR calc non Af Amer: >90 mL/min (ref >90)
Glucose, Bld: 93 mg/dL (ref 70–99)
Potassium: 3.6 mEq/L — ABNORMAL LOW (ref 3.7–5.3)
Sodium: 138 mEq/L (ref 137–147)

## 2013-10-22 LAB — CBC
HCT: 46.8 % (ref 39.0–52.0)
Hemoglobin: 16.4 g/dL (ref 13.0–17.0)
MCH: 30.7 pg (ref 26.0–34.0)
MCHC: 35 g/dL (ref 30.0–36.0)
MCV: 87.6 fL (ref 78.0–100.0)
Platelets: 191 10*3/uL (ref 150–400)
RBC: 5.34 MIL/uL (ref 4.22–5.81)
RDW: 13.9 % (ref 11.5–15.5)
WBC: 8 10*3/uL (ref 4.0–10.5)

## 2013-10-22 LAB — I-STAT TROPONIN, ED: Troponin i, poc: 0.02 ng/mL (ref 0.00–0.08)

## 2013-10-22 LAB — PRO B NATRIURETIC PEPTIDE: Pro B Natriuretic peptide (BNP): 355.2 pg/mL — ABNORMAL HIGH (ref 0–125)

## 2013-10-22 LAB — PROTIME-INR
INR: 0.89 (ref 0.00–1.49)
Prothrombin Time: 11.9 seconds (ref 11.6–15.2)

## 2013-10-22 MED ORDER — ASPIRIN 325 MG PO TABS
325.0000 mg | ORAL_TABLET | ORAL | Status: AC
  Administered 2013-10-22: 325 mg via ORAL
  Filled 2013-10-22: qty 1

## 2013-10-22 MED ORDER — NITROGLYCERIN 0.4 MG SL SUBL
0.4000 mg | SUBLINGUAL_TABLET | SUBLINGUAL | Status: DC | PRN
  Administered 2013-10-22: 0.4 mg via SUBLINGUAL
  Filled 2013-10-22 (×2): qty 1

## 2013-10-22 NOTE — ED Notes (Signed)
Pt. reports mid chest pain with palpitations and SOB onset 2 days ago , denies nausea or diaphoresis , history of CAD / CABG .

## 2013-10-23 ENCOUNTER — Observation Stay (HOSPITAL_COMMUNITY): Payer: 59

## 2013-10-23 ENCOUNTER — Encounter (HOSPITAL_COMMUNITY): Payer: Self-pay | Admitting: *Deleted

## 2013-10-23 DIAGNOSIS — I10 Essential (primary) hypertension: Secondary | ICD-10-CM

## 2013-10-23 DIAGNOSIS — I739 Peripheral vascular disease, unspecified: Secondary | ICD-10-CM

## 2013-10-23 DIAGNOSIS — F17200 Nicotine dependence, unspecified, uncomplicated: Secondary | ICD-10-CM

## 2013-10-23 DIAGNOSIS — M545 Low back pain, unspecified: Secondary | ICD-10-CM

## 2013-10-23 DIAGNOSIS — F1910 Other psychoactive substance abuse, uncomplicated: Secondary | ICD-10-CM

## 2013-10-23 DIAGNOSIS — E785 Hyperlipidemia, unspecified: Secondary | ICD-10-CM

## 2013-10-23 DIAGNOSIS — R079 Chest pain, unspecified: Secondary | ICD-10-CM | POA: Diagnosis present

## 2013-10-23 DIAGNOSIS — K219 Gastro-esophageal reflux disease without esophagitis: Secondary | ICD-10-CM

## 2013-10-23 DIAGNOSIS — F1010 Alcohol abuse, uncomplicated: Secondary | ICD-10-CM

## 2013-10-23 DIAGNOSIS — I25810 Atherosclerosis of coronary artery bypass graft(s) without angina pectoris: Secondary | ICD-10-CM

## 2013-10-23 LAB — RAPID URINE DRUG SCREEN, HOSP PERFORMED
Amphetamines: NOT DETECTED
BARBITURATES: NOT DETECTED
BENZODIAZEPINES: NOT DETECTED
Cocaine: NOT DETECTED
Opiates: NOT DETECTED
Tetrahydrocannabinol: POSITIVE — AB

## 2013-10-23 LAB — ETHANOL: ALCOHOL ETHYL (B): 26 mg/dL — AB (ref 0–11)

## 2013-10-23 LAB — TROPONIN I: Troponin I: <0.3 ng/mL (ref <0.30)

## 2013-10-23 LAB — LIPASE, BLOOD: Lipase: 55 U/L (ref 11–59)

## 2013-10-23 MED ORDER — TECHNETIUM TC 99M SESTAMIBI - CARDIOLITE
30.0000 | Freq: Once | INTRAVENOUS | Status: AC | PRN
  Administered 2013-10-23: 30 via INTRAVENOUS

## 2013-10-23 MED ORDER — HEPARIN SODIUM (PORCINE) 5000 UNIT/ML IJ SOLN
5000.0000 [IU] | Freq: Three times a day (TID) | INTRAMUSCULAR | Status: DC
  Filled 2013-10-23 (×4): qty 1

## 2013-10-23 MED ORDER — VITAMIN B-1 100 MG PO TABS
100.0000 mg | ORAL_TABLET | Freq: Every day | ORAL | Status: DC
  Administered 2013-10-23: 100 mg via ORAL
  Filled 2013-10-23: qty 1

## 2013-10-23 MED ORDER — ASPIRIN EC 81 MG PO TBEC
81.0000 mg | DELAYED_RELEASE_TABLET | Freq: Every day | ORAL | Status: DC
  Administered 2013-10-23: 81 mg via ORAL
  Filled 2013-10-23: qty 1

## 2013-10-23 MED ORDER — FAMOTIDINE 20 MG PO TABS
20.0000 mg | ORAL_TABLET | Freq: Every day | ORAL | Status: DC
  Administered 2013-10-23: 20 mg via ORAL
  Filled 2013-10-23: qty 1

## 2013-10-23 MED ORDER — LISINOPRIL-HYDROCHLOROTHIAZIDE 20-25 MG PO TABS: 1.0000 | ORAL_TABLET | Freq: Every day | ORAL | Status: DC

## 2013-10-23 MED ORDER — ONDANSETRON HCL 4 MG/2ML IJ SOLN: 4.0000 mg | Freq: Three times a day (TID) | INTRAMUSCULAR | Status: AC | PRN

## 2013-10-23 MED ORDER — REGADENOSON 0.4 MG/5ML IV SOLN
0.4000 mg | Freq: Once | INTRAVENOUS | Status: DC
  Filled 2013-10-23: qty 5

## 2013-10-23 MED ORDER — SODIUM CHLORIDE 0.9 % IV BOLUS (SEPSIS)
500.0000 mL | Freq: Once | INTRAVENOUS | Status: AC
  Administered 2013-10-23: 500 mL via INTRAVENOUS

## 2013-10-23 MED ORDER — SIMVASTATIN 10 MG PO TABS
10.0000 mg | ORAL_TABLET | Freq: Every day | ORAL | Status: DC
  Filled 2013-10-23: qty 1

## 2013-10-23 MED ORDER — ADULT MULTIVITAMIN W/MINERALS CH
1.0000 | ORAL_TABLET | Freq: Every day | ORAL | Status: DC
  Administered 2013-10-23: 1 via ORAL
  Filled 2013-10-23: qty 1

## 2013-10-23 MED ORDER — REGADENOSON 0.4 MG/5ML IV SOLN
INTRAVENOUS | Status: AC
  Administered 2013-10-23: 0.4 mg via INTRAVENOUS
  Filled 2013-10-23: qty 5

## 2013-10-23 MED ORDER — REGADENOSON 0.4 MG/5ML IV SOLN
0.4000 mg | Freq: Once | INTRAVENOUS | Status: AC
  Administered 2013-10-23: 0.4 mg via INTRAVENOUS
  Filled 2013-10-23: qty 5

## 2013-10-23 MED ORDER — LORAZEPAM 2 MG/ML IJ SOLN: 1.0000 mg | Freq: Four times a day (QID) | INTRAMUSCULAR | Status: DC | PRN

## 2013-10-23 MED ORDER — ATORVASTATIN CALCIUM 80 MG PO TABS
80.0000 mg | ORAL_TABLET | Freq: Every day | ORAL | Status: DC
  Filled 2013-10-23: qty 1

## 2013-10-23 MED ORDER — CLOPIDOGREL BISULFATE 75 MG PO TABS
75.0000 mg | ORAL_TABLET | Freq: Every day | ORAL | Status: DC
  Administered 2013-10-23: 75 mg via ORAL
  Filled 2013-10-23: qty 1

## 2013-10-23 MED ORDER — CARVEDILOL 12.5 MG PO TABS
12.5000 mg | ORAL_TABLET | Freq: Two times a day (BID) | ORAL | Status: DC
  Administered 2013-10-23: 12.5 mg via ORAL
  Filled 2013-10-23 (×3): qty 1

## 2013-10-23 MED ORDER — SODIUM CHLORIDE 0.9 % IV SOLN
INTRAVENOUS | Status: DC
  Administered 2013-10-23: 10 mL/h via INTRAVENOUS

## 2013-10-23 MED ORDER — THIAMINE HCL 100 MG/ML IJ SOLN
100.0000 mg | Freq: Every day | INTRAMUSCULAR | Status: DC
  Filled 2013-10-23: qty 1

## 2013-10-23 MED ORDER — LISINOPRIL 20 MG PO TABS
20.0000 mg | ORAL_TABLET | Freq: Every day | ORAL | Status: DC
  Administered 2013-10-23: 20 mg via ORAL
  Filled 2013-10-23: qty 1

## 2013-10-23 MED ORDER — HYDROCHLOROTHIAZIDE 25 MG PO TABS
25.0000 mg | ORAL_TABLET | Freq: Every day | ORAL | Status: DC
  Administered 2013-10-23: 25 mg via ORAL
  Filled 2013-10-23: qty 1

## 2013-10-23 MED ORDER — TECHNETIUM TC 99M SESTAMIBI - CARDIOLITE
10.0000 | Freq: Once | INTRAVENOUS | Status: AC | PRN
  Administered 2013-10-23: 10 via INTRAVENOUS

## 2013-10-23 MED ORDER — LORAZEPAM 1 MG PO TABS: 1.0000 mg | ORAL_TABLET | Freq: Four times a day (QID) | ORAL | Status: DC | PRN

## 2013-10-23 MED ORDER — MORPHINE SULFATE 2 MG/ML IJ SOLN
2.0000 mg | INTRAMUSCULAR | Status: DC | PRN
  Administered 2013-10-23: 2 mg via INTRAVENOUS
  Filled 2013-10-23: qty 1

## 2013-10-23 MED ORDER — FOLIC ACID 1 MG PO TABS
1.0000 mg | ORAL_TABLET | Freq: Every day | ORAL | Status: DC
  Administered 2013-10-23: 1 mg via ORAL
  Filled 2013-10-23: qty 1

## 2013-10-23 NOTE — H&P (Signed)
Attending Addendum  I examined the patient and discussed the assessment and plan with Dr. Durene Cal. I have reviewed the note and agree.  53 yo M with chest pain and non CAD s/p 4 vessel bypass admitted for chest pain rule out. Patient with only mild and intermittent pain with coughing this AM. Cough productive of yellow green sputum. Trop negative. EKG normal this AM. CXR w/o infiltrate.  BP 170/102  Pulse 74  Temp(Src) 97.5 F (36.4 C) (Oral)  Resp 18  Ht 5' 10.5" (1.791 m)  Wt 182 lb (82.555 kg)  BMI 25.74 kg/m2  SpO2 92% General appearance: alert, cooperative and no distress Lungs: clear to auscultation bilaterally Heart: regular rate and rhythm, S1, S2 normal, no murmur, click, rub or gallop Extremities: extremities normal, atraumatic, no cyanosis or edema  A/P: Improved.  Likely bronchitis. Agree with and appreciate cardiology plan for cardiac stress test today.     Dessa Phi, MD FAMILY [facility]ICINE TEACHING SERVICE

## 2013-10-23 NOTE — H&P (Signed)
[facility] History and Physical Service Pager: [phone]  Patient name: Randy [name] Medical record number: [id number] Date of birth: 04/15/1961 Age: 53 y.o. Gender: male  Primary Care Provider: Hazeline JunkerGrunz, Ryan, MD Consultants: Cardiology Code Status: Full  Chief Complaint: Chest Pain  Assessment and Plan: Randy [name] is a 53 y.o. male presenting with Chest pain. PMH is significant for MI (2010), CAD s/p CABG and stent in 2010, HTN, PVD, AAA, Carotid artery occlusion s/p CEA 12/2012, and anxiety.   # Cardiovascular: Chest Pain- Atypical chest pain (reproductiable with palpation) likely MSK related but given extensive cardiac Hx as noted above will admit to telemetry. [facility] (reports last seen in 2010). CXR: No acute abnormalities. BNP mildly elevated (355). 03/2011 lexiscan Nuclear stress test that showed mild LV with no ischemic changes. His EF was 45%. Wells Criteria 0, no PE workup planned.  - Cycle Troponins; iTrop negative in ED - Repeat EKG in am; EKG showed NSR, poor baseline, Nonspecific Twave changes in ED - Obtain ECHO - Consult [facility] in am; NPO after midnight  CABG - Evelene CroonBryan Bartle, M.D. - 10/11/08   1. Left internal mammary artery to the LAD.   2. Saphenous vein graft to the diagonal.   3. Saphenous vein graft to the obtuse marginal.   4. Saphenous vein graft to the posterior descending.  - Continue: ASA, Plavix, Nitrostat, and HTN meds as below - Morphine for pain control  # HTN - BP well controlled - Continue Home meds: Coreg 12.5 BID; Lisinopril-HCTZ 20/25 -brief period hypotension in ED but noted as asymptomatic and resolved by time of our visit , held BP meds until this AM  # Alcohol Use; Hx of Illicit drug use (THC, Cocaine) - Reports last drink <12 hrs (several LITs) - Ethanol level: pending (BAC 0.026 consistent with drinking earlier today) - Check UDS (positive for marijuana as  expected) - CIWA  # Hx of CEA 2014 - Attempt at left carotid endarterectomy on 01/13/2013 Dr Myra GianottiBrabham "unable to palpate an end to his disease, dissecting all the way out to the skull base. After discussions with several of my partners I felt it was not safe to open the artery" - Right carotid endarterectomy in May of 2012   # AAA: infrarenal abdominal aortic aneurysm has mildly enlarged, measuring 4.3 cm (01/13/13) and previously measuring 4.1 cm (12/2011) large amount of mural thrombus along the posterior aspect of the aneurysm. There is diffuse atherosclerotic disease throughout the abdominal aorta with irregular plaque - Continue conservative management (watchful waiting)  # PAD: CT Angio 12/2012 shows chronic occlusion or near occlusion of the left common iliac artery. Diffuse peripheral vascular disease. Segmental occlusions of the left mid and distal superficial femoral artery. - Current symptoms of Claudication - [facility]- Last seen 05/09/13  # HLD: Continue Mevacor; Consider Swithcing to Lipitor given extensive  # GERD: Continue Zantac  FEN/GI: NPO sips w/ meds; KVO  Prophylaxis: Heparin Subq  Disposition: Admit to tele; Discharge pending Cardiology recs in AM  History of Present Illness: Randy HooseKeith W Proud is a 53 y.o. male presenting with chest pain x 2-3 days. He describes the pain as "vibrating, fluttering", and says it is left-sided, non-radiating, and worse when lying on his back. He hasn't taken anything to help with the pain, and denies any SOB, N/V, or diaphoresis. He describes the pain as constant. He does not know  whether exertion makes his pain worse as he has severe claudication that limits him to about 50 steps on a regular basis.  He does back pain (lumbar) which he reports is chronic. He admits to alcohol use (several [place] ice teas today) and THC use occasionally but denies current tobacco use (quit 2010- ~40  pack year hx) or other illicit drug use. He reports MI, CABG, and Stents in 2010, CEA in 2014 and current symptoms of leg claudication. He also reports know AAA currently being monitored.   Review Of Systems: Per HPI with the following additions: Daughter present on admission  Otherwise 12 point review of systems was performed and was unremarkable.  Patient Active Problem List   Diagnosis Date Noted  . Acute chest pain 10/23/2013  . PVD (peripheral vascular disease) with claudication 05/09/2013  . Alcohol consumption heavy 03/28/2013  . GERD (gastroesophageal reflux disease) 03/28/2013  . Pain in limb 01/03/2013  . Lumbago 10/28/2012  . AAA (abdominal aortic aneurysm) without rupture 10/28/2012  . Peripheral vascular disease 02/12/2012  . Anxiety 02/12/2012  . Uncontrolled hypertension 02/02/2012  . Tobacco abuse 02/02/2012  . Financial difficulties 02/02/2012  . Occlusion and stenosis of carotid artery without mention of cerebral infarction 01/19/2012  . Pulmonary nodule 05/19/2011  . HYPERLIPIDEMIA-MIXED 10/19/2008  . ACUTE POSTHEMORRHAGIC ANEMIA 10/19/2008  . OTH MIXED/UNSPEC NONDEPENDENT DRUG ABUSE UNSPEC 10/19/2008  . HYPERTENSION, UNSPECIFIED 10/19/2008  . CAD, ARTERY BYPASS GRAFT 10/19/2008  . DISC DISEASE, LUMBAR 10/19/2008  . CHEST PAIN-UNSPECIFIED 10/19/2008   Past Medical History: Past Medical History  Diagnosis Date  . Hypertension   . CAD (coronary artery disease)   . Hx of CABG 2010  . Polysubstance abuse   . Abnormal heart rhythm   . Heart attack 2010  . Hyperlipidemia   . Lung nodule   . Irregular heartbeat   . Shortness of breath     when lying flat on back  . PVD (peripheral vascular disease)   . AAA (abdominal aortic aneurysm) July 2013  . Carotid artery occlusion   . GERD (gastroesophageal reflux disease)   . Anxiety    Past Surgical History: Past Surgical History  Procedure Laterality Date  . Coronary artery bypass graft  2010  .  Endarterectomy Left 01/13/2013    Procedure: ATTEMPTED ENDARTERECTOMY CAROTID;  Surgeon: Nada Libman, MD;  [facility] OR;  Service: Vascular;  Laterality: Left;  . Carotid endarterectomy Left 01-13-13    Attempted cea   Social History: History  Substance Use Topics  . Smoking status: Former Smoker -- 0.25 packs/day for 30 years    Types: Cigarettes    Quit date: 01/13/2013  . Smokeless tobacco: Never Used  . Alcohol Use: Yes     Comment: occasionally   Additional social history:   Please also refer to relevant sections of EMR.  Family History: Family History  Problem Relation Age of Onset  . Asthma Mother   . Diabetes Mother   . Hyperlipidemia Mother   . Hypertension Mother   . Lung cancer Father   . Cancer Father   . Stroke Sister    Allergies and Medications: No Known Allergies No current facility-administered medications on file prior to encounter.   Current Outpatient Prescriptions on File Prior to Encounter  Medication Sig Dispense Refill  . aspirin EC 81 MG tablet Take 81 mg by mouth daily.      [name] lisinopril-hydrochlorothiazide (PRINZIDE,ZESTORETIC) 20-25 MG per tablet Take 1 tablet by mouth daily.  90  tablet  5  . lovastatin (MEVACOR) 20 MG tablet Take 1 tablet (20 mg total) by mouth at bedtime.  90 tablet  3   Objective: BP 89/64  Pulse 76  Resp 17  SpO2 93% Exam: General: male appears older than stated age; NAD, resting comfortably in bed HEENT: MMM, poor dentition, PERRL  Cardiovascular: RRR no m/r/g; chest pain reproducible with palpation approximately mid clavicular line on left side of chest Respiratory: CTAB, decreased breath sounds; No rales, wheezing or rhonchi; occasional cough Abdomen: SNTND Extremities: Warm; No Edema; No pulses palpable in left foot  Neuro: A&O; Gross motor intact  Labs and Imaging: CBC BMET   Recent Labs Lab 10/22/13 2235  WBC 8.0  HGB 16.4  HCT 46.8  PLT 191    Recent Labs Lab 10/22/13 2235  NA 138  K 3.6*   CL 98  CO2 23  BUN 12  CREATININE 0.82  GLUCOSE 93  CALCIUM 9.1     Troponin (Point of Care Test)  Recent Labs  10/22/13 2244  TROPIPOC 0.02   Lipase     Component Value Date/Time   LIPASE 55 10/23/2013 0034   BNP    Component Value Date/Time   PROBNP 355.2* 10/22/2013 2235   Dg Chest 2 View 10/23/2013   IMPRESSION: Post CABG.  No acute abnormalities.  Small hiatal hernia.     Wenda Low, MD 10/23/2013, 12:52 AM PGY-1, [facility] Family Medicine FPTS Intern pager: [phone], text pages welcome

## 2013-10-23 NOTE — Progress Notes (Signed)
Utilization review completed.  

## 2013-10-23 NOTE — Progress Notes (Signed)
Pt stated he had chest pain 4/10 on arrival to department which is worse with deep breathing and lying on his back.  Troponin negative.  VSS, am EKG obtained with no acute changes noted.  O2 at 2L applied per [place], pt repositioned self on left side with some relief.  2 mg morphine sulfate IV per prn orders administered per protocol with relief.  Will continue to monitor closely.

## 2013-10-23 NOTE — ED Notes (Signed)
Family at bedside. 

## 2013-10-23 NOTE — ED Notes (Signed)
BP low; pt asymptomatic. md aware and orders received.

## 2013-10-23 NOTE — Consult Note (Addendum)
CARDIOLOGY CONSULT NOTE   Patient ID: Randy [name] MRN: [id number], DOB/AGE: Feb 28, 1961   Admit date: 10/22/2013 Date of Consult: 10/23/2013  Primary Physician: Hazeline Junker, MD Primary Cardiologist: ?  Reason for consult:  Chest pain  Problem List  Past Medical History  Diagnosis Date  . Hypertension   . CAD (coronary artery disease)   . Hx of CABG 2010  . Polysubstance abuse   . Abnormal heart rhythm   . Heart attack 2010  . Hyperlipidemia   . Lung nodule   . Irregular heartbeat   . Shortness of breath     when lying flat on back  . PVD (peripheral vascular disease)   . AAA (abdominal aortic aneurysm) July 2013  . Carotid artery occlusion   . GERD (gastroesophageal reflux disease)   . Anxiety     Past Surgical History  Procedure Laterality Date  . Coronary artery bypass graft  2010  . Endarterectomy Left 01/13/2013    Procedure: ATTEMPTED ENDARTERECTOMY CAROTID;  Surgeon: Nada Libman, MD;  Location: [facility] OR;  Service: Vascular;  Laterality: Left;  . Carotid endarterectomy Left 01-13-13    Attempted cea    Allergies  No Known Allergies  HPI   53 year old male with high blood pressure, known CAD, CABG in 2010 (CABG x 4 LIMA to LAD, SVG to diagonal, OM, PDA and right carotid endarterectomy ) with no cardiology follow up (per patient nobody told him to follow. He also has h/o hyperlipidemia, hypertension, alcohol abuse and peripheral vascular disease (the patient is planned to have aortobifemoral bypass, h/o B/L endarterectomy at the time of CABG) who presented with anterior chest pressure associated with palpitations. It happen several times in the last couple of days and it is non-exertional. Also associated with mild SOB.  The patient state that he has been complaint with his medicines including aspirin and Plavix and has been doing well since surgery until now. Patient denies diaphoresis however has mild nausea. He stopped smoking the last July. Pain free right  now. He denies syncope, PND. He states that he has experienced orthopnea ever since he had CABG.   Inpatient Medications  . aspirin EC  81 mg Oral Daily  . carvedilol  12.5 mg Oral BID WC  . clopidogrel  75 mg Oral Q breakfast  . famotidine  20 mg Oral Daily  . folic acid  1 mg Oral Daily  . heparin  5,000 Units Subcutaneous 3 times per day  . hydrochlorothiazide  25 mg Oral Daily  . lisinopril  20 mg Oral Daily  . multivitamin with minerals  1 tablet Oral Daily  . simvastatin  10 mg Oral q1800  . thiamine  100 mg Oral Daily   Or  . thiamine  100 mg Intravenous Daily   Family History Family History  Problem Relation Age of Onset  . Asthma Mother   . Diabetes Mother   . Hyperlipidemia Mother   . Hypertension Mother   . Lung cancer Father   . Cancer Father   . Stroke Sister      Social History History   Social History  . Marital Status: Widowed    Spouse Name: N/A    Number of Children: Y  . Years of Education: N/A   Occupational History  . not working.     Social History Main Topics  . Smoking status: Former Smoker -- 0.25 packs/day for 30 years    Types: Cigarettes    Quit date: 01/13/2013  .  Smokeless tobacco: Never Used  . Alcohol Use: Yes     Comment: occasionally  . Drug Use: No  . Sexual Activity: Not on file   Other Topics Concern  . Not on file   Social History Narrative   Pt is widowed and lives alone.           Review of Systems  General:  No chills, fever, night sweats or weight changes.  Cardiovascular:  No chest pain, dyspnea on exertion, edema, orthopnea, palpitations, paroxysmal nocturnal dyspnea. Dermatological: No rash, lesions/masses Respiratory: No cough, dyspnea Urologic: No hematuria, dysuria Abdominal:   No nausea, vomiting, diarrhea, bright red blood per rectum, melena, or hematemesis Neurologic:  No visual changes, wkns, changes in mental status. All other systems reviewed and are otherwise negative except as noted  above.  Physical Exam  Blood pressure 156/88, pulse 76, temperature 97.6 F (36.4 C), temperature source Oral, resp. rate 18, height 5' 10.5" (1.791 m), weight 182 lb (82.555 kg), SpO2 97.00%.  General: Pleasant, NAD Psych: Normal affect. Neuro: Alert and oriented X 3. Moves all extremities spontaneously. HEENT: Normal  Neck: Supple without bruits or JVD. Lungs:  Resp regular and unlabored, CTA. Heart: RRR no s3, s4, or murmurs. Abdomen: Soft, non-tender, non-distended, BS + x 4.  Extremities: No clubbing, no edema, no peripheral pulses on LE. DP/PT/Radials 2+ and equal bilaterally.  Labs  Recent Labs  10/23/13 0034 10/23/13 0602  TROPONINI <0.30 <0.30   Lab Results  Component Value Date   WBC 8.0 10/22/2013   HGB 16.4 10/22/2013   HCT 46.8 10/22/2013   MCV 87.6 10/22/2013   PLT 191 10/22/2013    Recent Labs Lab 10/22/13 2235  NA 138  K 3.6*  CL 98  CO2 23  BUN 12  CREATININE 0.82  CALCIUM 9.1  GLUCOSE 93   Lab Results  Component Value Date   CHOL 283* 03/19/2011   HDL 32* 03/19/2011   LDLCALC 208* 03/19/2011   TRIG 214* 03/19/2011   Lab Results  Component Value Date   DDIMER 0.37 03/18/2011   Radiology/Studies  Dg Chest 2 View  10/23/2013   CLINICAL DATA:  Chest pain, shortness of breath, history heart disease post CABG, hypertension, former smoker  EXAM: CHEST  2 VIEW  COMPARISON:  01/07/2013  FINDINGS: Normal heart size post CABG.  Mediastinal contours and pulmonary vascularity normal.  Lungs clear.  No infiltrate, pleural effusion or pneumothorax.  Stent identified in the LEFT cervical region.  No acute osseous findings.  Small hiatal hernia noted.  IMPRESSION: Post CABG.  No acute abnormalities.  Small hiatal hernia.   Electronically Signed   By: Ulyses Southward M.D.   On: 10/23/2013 00:30   Echocardiogram - 2010 LEFT VENTRICLE: - LVEF is normal at approximately 60%. Endocardium is difficult to see, cannot fully evaluate regional wall motion. - Left ventricular  size was normal. - Left ventricular wall thickness was mildly increased.  AORTIC VALVE: - Aortic valve thickness was mildly increased. - The aortic valve was mildly calcified.  Doppler interpretation(s): - There was no significant aortic valvular regurgitation.  MITRAL VALVE: - There was mild thickening of the mitral valve.  Doppler interpretation(s): - There was no significant mitral valvular regurgitation.  LEFT ATRIUM: - Left atrial size was normal.  RIGHT VENTRICLE: - Right ventricular size was normal. - Right ventricular systolic function was normal.  TRICUSPID VALVE: - The tricuspid valve was grossly normal.  Doppler interpretation(s): - There was trivial tricuspid valvular regurgitation.  RIGHT ATRIUM: - Right atrial size was normal.  SUMMARY - LVEF is normal at approximately 60%. Endocardium is difficult to see, cannot fully evaluate regional wall motion. Left ventricular wall thickness was mildly increased.  ECG: NSR, normal ECG, unchanged from 02/22/2013    ASSESSMENT AND PLAN  A 53 year old male with polysubstance abuse, known CAD, prior CABG in 2010, PVD (s/p endarterectomy, planned B/P aorto-femoral bypass)  1. Chest pain with palpitations - no episodes here, no arrhythmias on telemetry. ECG normal, Troponins negative x 2. However with significant history and planned high risk vascular surgery we will order a Lexiscan nuclear stress test for risk stratification.  If negative, he could be discharged and will need a follow up in our clinic.   2. Hypertension - hypertensive today, but hypotensive on admission, we will follow and adjust as needed  3. Hyperlipidemia - significant TAG and LDL elevation, low HDL, with h/o severe PVD and CAD he nedd to be on at least moderate dose of highly potent statin - we would recommend to switch to atrovastatin 80 mg po daily.     Signed, Lars MassonKatarina H Nashia Remus, MD, [facility] 10/23/2013, 8:02 AM

## 2013-10-23 NOTE — Discharge Summary (Signed)
[facility]  Patient name: Randy HammersKeith W Vahey Medical record number: [id number] Date of birth: 11/16/1960 Age: 53 y.o. Gender: male Date of Admission: 10/22/2013  Date of Discharge: 10/23/13 Admitting Physician: Lora PaulaJosalyn C Funches, MD  Primary Care Provider: Hazeline JunkerGrunz, Ryan, MD Consultants: CardiologY (Dr. Delton SeeNelson)  Indication for Hospitalization: Chest Pain  Discharge Diagnoses/Problem List:  Acute chest pain (resolved) CAD, PAD Hx MI 2010, CAD s/p CABG and stenting AAA, carotid artery occlusion s/p CEA 01/13/13 HTN HLD GERD Hx of EtOH and illicit drug use ([facility])  Disposition: discharge home  Discharge Condition: stable  Brief Hospital Course:  Randy [name] is a 53 y.o. male presenting with Chest pain. PMH is significant for MI (2010), CAD s/p CABG and stent in 2010, HTN, PVD, AAA, Carotid artery occlusion s/p CEA 12/2012, Hx of Cocaine and THC use and anxiety. His EKG showed NSR and Troponin's were negative. Cardiology was consulted due to his extensive cardiac history and [facility] was [facility] which showed no pharmacologic-induced ischemia and EF of 48%. He had resolution of his pain and was discharged home to resume his home medications and to follow-up outpt with cardiology.  # Cardiovascular: Chest Pain- Atypical chest pain (reproductiable with palpation and cough) likely MSK related but given extensive cardiac Hx [MI (2010), CAD s/p CABG and stent in 2010, HTN, PVD, AAA, Carotid artery occlusion s/p CEA 12/2012] was admitted and Cardiology consulted.  CXR showed No acute abnormalities. BNP mildly elevated (355). Lexiscan Nuclear stress test (03/2011) that showed mild LV with no ischemic changes with EF was 45%. Wells Criteria 0, no PE workup done. Troponins were negative x 3, and EKG showed NSR. [facility] that showed no pharmacologic-induced ischemia and EF of 48%. Continued: ASA, Plavix,  Nitrostat, and HTN meds as below   # HTN- BP well controlled. Continued Home meds: Coreg 12.5 BID; Lisinopril-HCTZ 20/25   # Alcohol Use; Hx of Illicit drug use (THC, Cocaine) BAC was 0.026 consistent with drinking prior to admission. UDS positive for marijuana as expected. Maintained on CIWA while inpatient  # Hx of CEA 2014  Status post right carotid endarterectomy in May of 2012.  Attempt left carotid endarterectomy on 01/13/2013 Dr Myra GianottiBrabham "unable to palpate an end to his disease, dissecting all the way out to the skull base. After discussions with several of my partners I felt it was not safe to open the artery"    # AAA: Infrarenal abdominal aortic aneurysm has mildly enlarged, measuring 4.3 cm (01/13/13) and previously measuring 4.1 cm (12/2011) large amount of mural thrombus along the posterior aspect of the aneurysm. There is diffuse atherosclerotic disease throughout the abdominal aorta with irregular plaque. Continue conservative management (watchful waiting)   # PAD: CT Angio 12/2012 shows chronic occlusion or near occlusion of the left common iliac artery. Diffuse peripheral vascular disease. Segmental occlusions of the left mid and distal superficial femoral artery. Has current symptoms of Claudication and is followed [facility]abham [facility]- Last seen 05/09/13.    # HLD: Continue Mevacor; Consider Swithcing to Lipitor given extensive  # GERD: Continue Zantac   Issues for Follow Up:  1. Ensure resolution of chest pain. Encourage good control of long-term issues, esp HLD and HTN. 2. Consider change from Mevacor to high-intensity statin (high-dose Lipitor or Crestor)  Significant Procedures: Lexiscan Myoview, 4/26 FINDINGS:  The stress SPECT images demonstrate physiologic distribution of  radiopharmaceutical.  Rest images demonstrate no perfusion defects.  The gated stress SPECT images demonstrate normal left ventricular  myocardial thickening. No  focal wall motion abnormality is seen.  Calculated left ventricular end-diastolic volume 113 mL,  end-systolic volume 59 mL, ejection fraction of 48%.  IMPRESSION:  1. Negative for pharmacologic-stress induced ischemia.  2. Left ventricular ejection fraction 48%.  Significant Labs and Imaging:   Recent Labs Lab 10/22/13 2235  WBC 8.0  HGB 16.4  HCT 46.8  PLT 191    Recent Labs Lab 10/22/13 2235  NA 138  K 3.6*  CL 98  CO2 23  GLUCOSE 93  BUN 12  CREATININE 0.82  CALCIUM 9.1      Recent Labs Lab 10/23/13 0034 10/23/13 0602 10/23/13 1213  TROPONINI <0.30 <0.30 <0.30   BNP    Component Value Date/Time   PROBNP 355.2* 10/22/2013 2235   Lipase     Component Value Date/Time   LIPASE 55 10/23/2013 0034   Dg Chest 2 View 10/23/2013  FINDINGS: Normal heart size post CABG.  Mediastinal contours and pulmonary vascularity normal.  Lungs clear.  No infiltrate, pleural effusion or pneumothorax.  Stent identified in the LEFT cervical region.  No acute osseous findings.  Small hiatal hernia noted.  IMPRESSION: Post CABG.  No acute abnormalities.  Small hiatal hernia.     Results/Tests Pending at Time of Discharge: none  Discharge Medications:    Medication List         aspirin EC 81 MG tablet  Take 81 mg by mouth daily.     carvedilol 12.5 MG tablet  Commonly known as:  COREG  Take 12.5 mg by mouth 2 (two) times daily with a meal.     clopidogrel 75 MG tablet  Commonly known as:  PLAVIX  Take 75 mg by mouth daily with breakfast.     lisinopril-hydrochlorothiazide 20-25 MG per tablet  Commonly known as:  PRINZIDE,ZESTORETIC  Take 1 tablet by mouth daily.     lovastatin 20 MG tablet  Commonly known as:  MEVACOR  Take 1 tablet (20 mg total) by mouth at bedtime.     ranitidine 300 MG tablet  Commonly known as:  ZANTAC  Take 300 mg by mouth daily.       Discharge Instructions: Please refer to Patient Instructions section of EMR for full details.  Patient was  counseled important signs and symptoms that should prompt return to medical care, changes in medications, dietary instructions, activity restrictions, and follow up appointments.   Follow-Up Appointments: Follow-up Information   Follow up with Hazeline Junker, MD On 10/31/2013. (Apt at 10am)    Specialty:  Family Medicine   Contact information:   [address] [place] [address] [phone]       Schedule an appointment as soon as possible for a visit with [facility]. (Call to schedule hospital follow-up (with a heart doctor))    Contact information:   [address] [place] [address] [phone]     The above was prepared primarily by Dr. Gayla Doss, MD PGY-1, completed in full by me. Bobbye Morton, MD 10/23/2013, 4:49 PM PGY-2, [facility]

## 2013-10-23 NOTE — Discharge Summary (Signed)
Attending Addendum  I examined the patient and discussed the discharge plan with Dr. Casper Harrison. I have reviewed the note and agree.    Patient should be switched to atorvastatin 40 or crestor 20 (high intensity statin) as tolerated.  Dessa Phi, MD FAMILY MEDICINE TEACHING SERVICE

## 2013-10-23 NOTE — Discharge Instructions (Signed)
Randy [name] you were hospitalized for chest pain. Due to your history of heart problems, Cardiology was consulted and Stress test performed. Your stress test was negative. You should continue taking your regular home medicines. You should see Dr. Jarvis Newcomer on 5/4 at 10 AM for follow-up. He may want to adjust some medicines, at that time.

## 2013-10-23 NOTE — Progress Notes (Signed)
Dr. Cristal Deer of [facility] made aware stress test being negative. Received order to D/C home. All materials provided and questions answered.  Pt discharged home, self care with family. Left unit ambulatory.

## 2013-10-23 NOTE — ED Provider Notes (Signed)
CSN: [id number]     Arrival date & time 10/22/13  2231 History   First MD Initiated Contact with Patient 10/22/13 2236     Chief Complaint  Patient presents with  . Chest Pain     (Consider location/radiation/quality/duration/timing/severity/associated sxs/prior Treatment) HPI Comments: 53 year old male with high blood pressure, CABG, CAD, heart attack, lipids, vascular disease, alcohol abuse presents with anterior chest ache and pressure. Patient has had intermittent symptoms the past 2 days lasting 5-10 minutes. They do occur with exertion it certain times. Patient unsure of similar to previous cardiac history symptoms. Patient denies diaphoresis however has mild nausea. Patient is on aspirin and Plavix and has been taking them. Pain nonradiating. Patient denies recent surgery, DVT, PE, leg pain or swelling, active cancer. Patient unsure cardiologist name.  Patient is a 53 y.o. male presenting with chest pain. The history is provided by the patient.  Chest Pain Associated symptoms: no abdominal pain, no back pain, no fever, no headache, no shortness of breath and not vomiting     Past Medical History  Diagnosis Date  . Hypertension   . CAD (coronary artery disease)   . Hx of CABG 2010  . Polysubstance abuse   . Abnormal heart rhythm   . Heart attack 2010  . Hyperlipidemia   . Lung nodule   . Irregular heartbeat   . Shortness of breath     when lying flat on back  . PVD (peripheral vascular disease)   . AAA (abdominal aortic aneurysm) July 2013  . Carotid artery occlusion   . GERD (gastroesophageal reflux disease)   . Anxiety    Past Surgical History  Procedure Laterality Date  . Coronary artery bypass graft  2010  . Endarterectomy Left 01/13/2013    Procedure: ATTEMPTED ENDARTERECTOMY CAROTID;  Surgeon: Nada LibmanVance W Brabham, MD;  [facility];  Service: Vascular;  Laterality: Left;  . Carotid endarterectomy Left 01-13-13    Attempted cea   Family History  Problem Relation  Age of Onset  . Asthma Mother   . Diabetes Mother   . Hyperlipidemia Mother   . Hypertension Mother   . Lung cancer Father   . Cancer Father   . Stroke Sister    History  Substance Use Topics  . Smoking status: Former Smoker -- 0.25 packs/day for 30 years    Types: Cigarettes    Quit date: 01/13/2013  . Smokeless tobacco: Never Used  . Alcohol Use: Yes     Comment: occasionally    Review of Systems  Constitutional: Positive for appetite change. Negative for fever and chills.  HENT: Negative for congestion.   Eyes: Negative for visual disturbance.  Respiratory: Negative for shortness of breath.   Cardiovascular: Positive for chest pain. Negative for leg swelling.  Gastrointestinal: Negative for vomiting and abdominal pain.  Genitourinary: Negative for dysuria and flank pain.  Musculoskeletal: Negative for back pain, neck pain and neck stiffness.  Skin: Negative for rash.  Neurological: Negative for light-headedness and headaches.      Allergies  Review of patient's allergies indicates no known allergies.  Home Medications   Prior to Admission medications   Medication Sig Start Date End Date Taking? Authorizing Provider  aspirin EC 81 MG tablet Take 81 mg by mouth daily.   Yes Historical Provider, MD  carvedilol (COREG) 12.5 MG tablet Take 12.5 mg by mouth 2 (two) times daily with a meal.   Yes Historical Provider, MD  clopidogrel (PLAVIX) 75 MG tablet Take 75 mg  by mouth daily with breakfast.   Yes Historical Provider, MD  lisinopril-hydrochlorothiazide (PRINZIDE,ZESTORETIC) 20-25 MG per tablet Take 1 tablet by mouth daily. 05/11/13  Yes Hazeline Junker, MD  lovastatin (MEVACOR) 20 MG tablet Take 1 tablet (20 mg total) by mouth at bedtime. 10/28/12  Yes Tobey Grim, MD  ranitidine (ZANTAC) 300 MG tablet Take 300 mg by mouth daily.   Yes Historical Provider, MD   BP 156/88  Pulse 76  Temp(Src) 97.6 F (36.4 C) (Oral)  Resp 18  Ht 5' 10.5" (1.791 m)  Wt 182 lb (82.555  kg)  BMI 25.74 kg/m2  SpO2 97% Physical Exam  Nursing note and vitals reviewed. Constitutional: He is oriented to person, place, and time. He appears well-developed and well-nourished.  HENT:  Head: Normocephalic and atraumatic.  Eyes: Conjunctivae are normal. Right eye exhibits no discharge. Left eye exhibits no discharge.  Neck: Normal range of motion. Neck supple. No tracheal deviation present.  Cardiovascular: Normal rate and regular rhythm.   Pulmonary/Chest: Effort normal and breath sounds normal.  Abdominal: Soft. He exhibits no distension. There is no tenderness. There is no guarding.  Musculoskeletal: He exhibits no edema and no tenderness.  Neurological: He is alert and oriented to person, place, and time.  Skin: Skin is warm. No rash noted.  Psychiatric: He has a normal mood and affect.    ED Course  Procedures (including critical care time) Labs Review Labs Reviewed  BASIC METABOLIC PANEL - Abnormal; Notable for the following:    Potassium 3.6 (*)    All other components within normal limits  PRO B NATRIURETIC PEPTIDE - Abnormal; Notable for the following:    Pro B Natriuretic peptide (BNP) 355.2 (*)    All other components within normal limits  URINE RAPID DRUG SCREEN (HOSP PERFORMED) - Abnormal; Notable for the following:    Tetrahydrocannabinol POSITIVE (*)    All other components within normal limits  CBC  PROTIME-INR  TROPONIN I  LIPASE, BLOOD  ETHANOL  TROPONIN I  TROPONIN I  I-STAT TROPOININ, ED    Imaging Review Dg Chest 2 View  10/23/2013   CLINICAL DATA:  Chest pain, shortness of breath, history heart disease post CABG, hypertension, former smoker  EXAM: CHEST  2 VIEW  COMPARISON:  01/07/2013  FINDINGS: Normal heart size post CABG.  Mediastinal contours and pulmonary vascularity normal.  Lungs clear.  No infiltrate, pleural effusion or pneumothorax.  Stent identified in the LEFT cervical region.  No acute osseous findings.  Small hiatal hernia noted.   IMPRESSION: Post CABG.  No acute abnormalities.  Small hiatal hernia.   Electronically Signed   By: Ulyses Southward M.D.   On: 10/23/2013 00:30     EKG Interpretation   Date/Time:  Saturday October 22 2013 22:33:09 EDT Ventricular Rate:  90 PR Interval:  132 QRS Duration: 86 QT Interval:  374 QTC Calculation: 457 R Axis:   64 Text Interpretation:  Normal sinus rhythm Nonspecific T wave abnormality  Abnormal ECG poor baseline Confirmed by Shardee Dieu  MD, Dhara Schepp ([id number]) on  10/22/2013 11:07:19 PM      MDM   Final diagnoses:  Acute chest pain  CAD, ARTERY BYPASS GRAFT   Patient with known coronary artery disease and up abuse history. Chest pain improved in the emergency department patient felt improved however still mild pain with high risk history plan for telemetry observation and further evaluation. Spoke with family practice resident for admission. Patient had aspirin today. EKG no acute ST  findings. Troponin negative. Chest x-ray results reviewed no acute findings.  The patients results and plan were reviewed and discussed.   Any x-rays performed were personally reviewed by myself.   Differential diagnosis were considered with the presenting HPI.  EKG: reviewd  Filed Vitals:   10/23/13 0245 10/23/13 0300 10/23/13 0318 10/23/13 0340  BP: 108/80 102/73  156/88  Pulse: 74 70  76  Temp:    97.6 F (36.4 C)  TempSrc:    Oral  Resp: 16 19  18   Height:    5' 10.5" (1.791 m)  Weight:    182 lb (82.555 kg)  SpO2: 93% 93% 96% 97%    Admission/ observation were discussed with the admitting physician, patient and/or family and they are comfortable with the plan.     Enid Skeens, MD 10/23/13 [phone]

## 2013-10-31 ENCOUNTER — Encounter: Payer: Self-pay | Admitting: Family Medicine

## 2013-10-31 ENCOUNTER — Ambulatory Visit (INDEPENDENT_AMBULATORY_CARE_PROVIDER_SITE_OTHER): Payer: 59 | Admitting: Family Medicine

## 2013-10-31 VITALS — BP 180/90 | HR 88 | Temp 98.0°F | Ht 70.0 in | Wt 183.8 lb

## 2013-10-31 DIAGNOSIS — R079 Chest pain, unspecified: Secondary | ICD-10-CM

## 2013-10-31 DIAGNOSIS — F1910 Other psychoactive substance abuse, uncomplicated: Secondary | ICD-10-CM

## 2013-10-31 DIAGNOSIS — F17200 Nicotine dependence, unspecified, uncomplicated: Secondary | ICD-10-CM

## 2013-10-31 DIAGNOSIS — I714 Abdominal aortic aneurysm, without rupture, unspecified: Secondary | ICD-10-CM

## 2013-10-31 DIAGNOSIS — I739 Peripheral vascular disease, unspecified: Secondary | ICD-10-CM

## 2013-10-31 DIAGNOSIS — M5137 Other intervertebral disc degeneration, lumbosacral region: Secondary | ICD-10-CM

## 2013-10-31 DIAGNOSIS — Z720 Tobacco use: Secondary | ICD-10-CM

## 2013-10-31 DIAGNOSIS — I10 Essential (primary) hypertension: Secondary | ICD-10-CM

## 2013-10-31 MED ORDER — ATORVASTATIN CALCIUM 80 MG PO TABS: 80.0000 mg | ORAL_TABLET | Freq: Every day | ORAL | Status: DC

## 2013-10-31 MED ORDER — CYCLOBENZAPRINE HCL 10 MG PO TABS: 10.0000 mg | ORAL_TABLET | Freq: Three times a day (TID) | ORAL | Status: DC | PRN

## 2013-10-31 MED ORDER — AMLODIPINE BESYLATE 10 MG PO TABS: 10.0000 mg | ORAL_TABLET | Freq: Every day | ORAL | Status: DC

## 2013-10-31 NOTE — Progress Notes (Signed)
Patient ID: Randy [name], male   DOB: 10/23/1960, 53 y.o.   MRN: [id number]   Subjective:  HPI:   Randy [name] is a 53 y.o. male with a history of cigarette smoking, HTN, CAD s/p CABG in 2010, and severe PAD here for hospital follow up.   He was recently admitted for chest pain that was atypical. Myoview showed no inducible ischemia and EF of 48%. He does not have chest pain today.  He quit smoking on January 13, 2013!   He did not take his blood pressure medications this morning because he was out of the house before remembering.    He experiences symptoms of claudication in < 100 meters. A walking stick does help him. He is due for an aortobifem bypass but has been told that recovery would take up to 6 months. 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 He is worried about not making money during that time, so he is not going to have the procedure as long as he is able to work.   His back hurts all the time constantly, unchanged from previous. Oxycodone makes him nauseous and irritable. He has used most of the flexeril prescribed at our last visit. He also reports smoking marijuana for the pain occasionally.   Review of Systems:  Per HPI. All other systems reviewed and are negative.    Past Medical History: Patient Active Problem List   Diagnosis Date Noted  . Acute chest pain 10/23/2013  . Chest pain 10/23/2013  . PVD (peripheral vascular disease) with claudication 05/09/2013  . Alcohol consumption heavy 03/28/2013  . GERD (gastroesophageal reflux disease) 03/28/2013  . Pain in limb 01/03/2013  . Lumbago 10/28/2012  . AAA (abdominal aortic aneurysm) without rupture 10/28/2012  . Peripheral vascular disease 02/12/2012  . Anxiety 02/12/2012  . Uncontrolled hypertension 02/02/2012  . Tobacco abuse 02/02/2012  . Financial difficulties 02/02/2012  . Occlusion and stenosis of carotid artery without mention of cerebral infarction 01/19/2012  .  Pulmonary nodule 05/19/2011  . HYPERLIPIDEMIA-MIXED 10/19/2008  . ACUTE POSTHEMORRHAGIC ANEMIA 10/19/2008  . OTH MIXED/UNSPEC NONDEPENDENT DRUG ABUSE UNSPEC 10/19/2008  . HYPERTENSION, UNSPECIFIED 10/19/2008  . CAD, ARTERY BYPASS GRAFT 10/19/2008  . DISC DISEASE, LUMBAR 10/19/2008  . CHEST PAIN-UNSPECIFIED 10/19/2008    Medications: reviewed and updated Current Outpatient Prescriptions  Medication Sig Dispense Refill  . aspirin EC 81 MG tablet Take 81 mg by mouth daily.      . carvedilol (COREG) 12.5 MG tablet Take 12.5 mg by mouth 2 (two) times daily with a meal.      . clopidogrel (PLAVIX) 75 MG tablet Take 75 mg by mouth daily with breakfast.      . lisinopril-hydrochlorothiazide (PRINZIDE,ZESTORETIC) 20-25 MG per tablet Take 1 tablet by mouth daily.  90 tablet  5  . lovastatin (MEVACOR) 20 MG tablet Take 1 tablet (20 mg total) by mouth at bedtime.  90 tablet  3  . ranitidine (ZANTAC) 300 MG tablet Take 300 mg by mouth daily.       No current facility-administered medications for this visit.    Objective:  Physical Exam: BP 180/90  Pulse 88  Temp(Src) 98 F (36.7 C) (Oral)  Ht 5\' 10"  (1.778 m)  Wt 183 lb 12.8 oz (83.371 kg)  BMI 26.37 kg/m2  Gen: Chronically-ill appearing 53 y.o. male in NAD HEENT: MMM, EOMI, PERRL, anicteric sclerae CV: RRR, no MRG, no JVD Resp: Non-labored, CTAB, no  wheezes noted Abd: Soft, NTND, BS present, no guarding or organomegaly MSK: Normal muscle bulk, full ROM Back:  Normal skin. Spine with normal alignment and no deformity. No tenderness to vertebral process palpation. Paraspinous muscles are tender and with spasm.   Range of motion is full at neck and lumbar sacral regions. Straight leg raise is negative. Neuro:  Sensation and motor function 5/5 bilateral lower extremities. Patellar and achilles DTR's 2+ Neuro: Alert and oriented, speech normal Lower extremities: with sparse hair, good cap refill, weakly palpable DP pulses. No edema. No  dermatitis or varicose veins.  Assessment:     Randy [name] is a 53 y.o. male here for hospital follow up.     Plan:     See problem list for problem-specific plans. - Add norvasc - Return in 2 weeks for BP check - Encouraged continued smoking cessation - Increase statin potency: D/C lovastatin, start lipitor 80mg

## 2013-10-31 NOTE — Assessment & Plan Note (Addendum)
Measured at 4.3cm July 2014. Encouraged continued smoking cessation and strict medical adherence to keep BP down. Added norvasc today. He currently wishes not to undergo bypass due to concern of recovery time and its financial impact on his family.

## 2013-10-31 NOTE — Assessment & Plan Note (Signed)
No symptoms today - Add norvasc - Return in 2 weeks for BP check - Encouraged continued smoking cessation

## 2013-10-31 NOTE — Assessment & Plan Note (Signed)
Discussed smoking cessation at length. Controlling BP: Add norvasc, recheck BP in 2 weeks. Stressed importance of adherence. Augmenting statin to lipitor 80mg . Discussed 5.5cm critical cutoff.

## 2013-10-31 NOTE — Assessment & Plan Note (Signed)
Prescribed flexeril 10 mg #30. No red flags for cauda equina.

## 2013-10-31 NOTE — Patient Instructions (Signed)
Thank you for coming in today!  STOP taking mevacor (lovastatin) START taking lipitor 80mg  once everyday  START taking amlodipine 10mg  once every day TAKE flexeril 10mg  only as needed for back spasms when you absolutely need it.   COME BACK IN 2 WEEKS FOR A BLOOD PRESSURE CHECK. Make sure you take your medications up until then and on that day.   As you leave, make an appointment to follow up with me in 6 months.   Take care and seek immediate care sooner if you develop any concerns.  Please feel free to call with any questions or concerns at any time, at [phone]. - Dr. Jarvis Newcomer

## 2013-10-31 NOTE — Assessment & Plan Note (Signed)
+   THC at admission in April 2015.

## 2013-10-31 NOTE — Assessment & Plan Note (Signed)
Denies today.

## 2013-11-07 ENCOUNTER — Ambulatory Visit: Payer: 59 | Admitting: Pharmacist

## 2013-11-14 ENCOUNTER — Ambulatory Visit (INDEPENDENT_AMBULATORY_CARE_PROVIDER_SITE_OTHER): Payer: 59 | Admitting: *Deleted

## 2013-11-14 VITALS — BP 180/90

## 2013-11-14 DIAGNOSIS — I10 Essential (primary) hypertension: Secondary | ICD-10-CM

## 2013-11-14 NOTE — Progress Notes (Signed)
  Pt in clinic today for blood pressure check.  Blood pressure 180/90 manually.  Pt denies any chest pain, dizzy, numbness/tingling of extremities.   Pt stated after starting Norvasc, he feels tired all day and sometimes lightedness.  Pt took medication as prescribed.  Will forward to PCP.  Clovis Pu, RN

## 2013-11-22 ENCOUNTER — Telehealth: Payer: Self-pay | Admitting: *Deleted

## 2013-11-22 NOTE — Telephone Encounter (Signed)
Message copied by Farrell Ours on Tue Nov 22, 2013  8:52 AM ------      Message from: Jacquelin Hawking A      Created: Fri Nov 18, 2013 11:56 PM       Please inform patient to schedule appointment to see PCP this week so blood pressure can be better managed. Thanks! ------

## 2013-11-25 ENCOUNTER — Encounter: Payer: Self-pay | Admitting: Family Medicine

## 2013-11-25 ENCOUNTER — Ambulatory Visit (INDEPENDENT_AMBULATORY_CARE_PROVIDER_SITE_OTHER): Payer: 59 | Admitting: Family Medicine

## 2013-11-25 VITALS — BP 150/92 | HR 76 | Ht 70.0 in | Wt 180.6 lb

## 2013-11-25 DIAGNOSIS — IMO0002 Reserved for concepts with insufficient information to code with codable children: Secondary | ICD-10-CM

## 2013-11-25 DIAGNOSIS — I10 Essential (primary) hypertension: Secondary | ICD-10-CM

## 2013-11-25 DIAGNOSIS — S86919A Strain of unspecified muscle(s) and tendon(s) at lower leg level, unspecified leg, initial encounter: Secondary | ICD-10-CM | POA: Insufficient documentation

## 2013-11-25 MED ORDER — TRAMADOL HCL 50 MG PO TABS: 50.0000 mg | ORAL_TABLET | Freq: Three times a day (TID) | ORAL | Status: DC | PRN

## 2013-11-25 MED ORDER — HYDRALAZINE HCL 25 MG PO TABS: 25.0000 mg | ORAL_TABLET | Freq: Three times a day (TID) | ORAL | Status: DC

## 2013-11-25 NOTE — Progress Notes (Signed)
Patient ID: Randy [name], male   DOB: 06/26/1961, 53 y.o.   MRN: [id number]   Subjective:  HPI:   Randy [name] is a 53 y.o. male with a history of (previous) cigarette smoking, uncontrolled HTN, CAD s/p CABG in 2010, and severe PAD here for HTN follow up and back pain.   He has been taking his medications approximately 50% of the days since his last visit. He developed blurry vision after starting amlodipine. He feels no different from before - no HA, CP, SOB, dizziness, light-headedness, palpitations, N/V/D, no LE edema.   While working on his car 3 days ago he twisted his body to the right after a deadbolt released and he immediately felt severe pain in his right upper leg/groin. The area became swollen and his ROM has been drastically decreased. He has not been able to work or traverse stairs. He has tried tylenol, NSAIDs, and ice. It is improving slowly.   Review of Systems:  Per HPI. All other systems reviewed and are negative.    Past Medical History: Patient Active Problem List   Diagnosis Date Noted  . Muscle strain of lower extremity 11/25/2013  . Chest pain 10/23/2013  . PVD (peripheral vascular disease) with claudication 05/09/2013  . Alcohol consumption heavy 03/28/2013  . GERD (gastroesophageal reflux disease) 03/28/2013  . Pain in limb 01/03/2013  . Lumbago 10/28/2012  . AAA (abdominal aortic aneurysm) without rupture 10/28/2012  . Peripheral vascular disease 02/12/2012  . Anxiety 02/12/2012  . Uncontrolled hypertension 02/02/2012  . Tobacco abuse 02/02/2012  . Financial difficulties 02/02/2012  . Occlusion and stenosis of carotid artery without mention of cerebral infarction 01/19/2012  . Pulmonary nodule 05/19/2011  . HYPERLIPIDEMIA-MIXED 10/19/2008  . OTH MIXED/UNSPEC NONDEPENDENT DRUG ABUSE UNSPEC 10/19/2008  . CAD, ARTERY BYPASS GRAFT 10/19/2008  . DISC DISEASE, LUMBAR 10/19/2008  . CHEST PAIN-UNSPECIFIED 10/19/2008    Medications: reviewed and  updated Current Outpatient Prescriptions  Medication Sig Dispense Refill  . aspirin EC 81 MG tablet Take 81 mg by mouth daily.      [name]. atorvastatin (LIPITOR) 80 MG tablet Take 1 tablet (80 mg total) by mouth daily.  90 tablet  3  . carvedilol (COREG) 12.5 MG tablet Take 12.5 mg by mouth 2 (two) times daily with a meal.      . clopidogrel (PLAVIX) 75 MG tablet Take 75 mg by mouth daily with breakfast.      . cyclobenzaprine (FLEXERIL) 10 MG tablet Take 1 tablet (10 mg total) by mouth 3 (three) times daily as needed for muscle spasms.  30 tablet  0  . lisinopril-hydrochlorothiazide (PRINZIDE,ZESTORETIC) 20-25 MG per tablet Take 1 tablet by mouth daily.  90 tablet  5  . ranitidine (ZANTAC) 300 MG tablet Take 300 mg by mouth daily.      . traMADol (ULTRAM) 50 MG tablet Take 1 tablet (50 mg total) by mouth every 8 (eight) hours as needed.  20 tablet  0   No current facility-administered medications for this visit.    Objective:  Physical Exam: BP 150/92  Pulse 76  Ht 5\' 10"  (1.778 m)  Wt 180 lb 9.6 oz (81.92 kg)  BMI 25.91 kg/m2  Gen: Chronically ill-appearing 53 y.o. male sitting with right leg extended in NAD HEENT: MMM, EOMI, PERRL, anicteric sclera, poor dentition CV: RRR, no MRG, no JVD, distant heart sounds Resp: Non-labored, CTAB, no wheezes noted MSK: No edema noted, right leg without deformity, nocifensive response to ROM but still full ROM.  Pain to palpation along medial sartorius/adductus longus.   Assessment:     Randy [name] is a 53 y.o. male here for HTN follow up and leg pain.     Plan:     See problem list for problem-specific plans.

## 2013-11-25 NOTE — Assessment & Plan Note (Signed)
Moderate-severe causing severe pain that has been slowly improving but is limiting ability to work. Prescribing short course of tramadol.

## 2013-11-25 NOTE — Assessment & Plan Note (Signed)
Did not tolerate amlodipine (blurry vision) but has been taking it about every other day. Even with that, pressures are improved, but will try hydralazine instead. Could consider adding norvasc back at low dose to reach goal. F/u 4 weeks. Reviewed DASH diet, exercise as tolerated, and congratulated about smoking cessation.

## 2013-12-19 ENCOUNTER — Ambulatory Visit (INDEPENDENT_AMBULATORY_CARE_PROVIDER_SITE_OTHER): Payer: 59 | Admitting: Family Medicine

## 2013-12-19 ENCOUNTER — Encounter: Payer: Self-pay | Admitting: Family Medicine

## 2013-12-19 VITALS — BP 185/96 | HR 80 | Temp 98.1°F | Ht 70.0 in | Wt 182.6 lb

## 2013-12-19 DIAGNOSIS — I10 Essential (primary) hypertension: Secondary | ICD-10-CM

## 2013-12-19 MED ORDER — AMLODIPINE BESYLATE 5 MG PO TABS: 5.0000 mg | ORAL_TABLET | Freq: Every day | ORAL | Status: DC

## 2013-12-19 NOTE — Patient Instructions (Signed)
Let's go back to the low dose of amlodipine (5mg ) once per day and stop hydralazine. I don't think your blurry vision is from a specific medicine. We'll follow up in 1 month.

## 2013-12-19 NOTE — Progress Notes (Signed)
Patient ID: Randy [name], male   DOB: 04/11/1961, 53 y.o.   MRN: [id number]   Subjective:  HPI:   Randy HooseKeith W [name] is a 53 y.o. male with a history of (previous) cigarette smoking, uncontrolled HTN, CAD s/p CABG in 2010, and severe PAD here for HTN follow up.  At our last visit we stopped amlodipine because he has blurry vision going from 5mg  to 10mg . Hydralazine 25mg  TID was started. He has been taking his medications fairly well. Reports rarely taking middle of the day dose of hydralazine and missing other doses an average of 2 days per week. Still with blurry vision worsened by standing up. His claudication is stable (40-50 feet, and walks very slowly).   Denies HA, CP, change in SOB, dizziness, light-headedness, palpitations, N/V/D, LE edema.   Of note, a recent myoview showed no pharmacologic-stress induced ischemia and an EF of 48%.  Review of Systems:  Per HPI. All other systems reviewed and are negative.    Past Medical History: Patient Active Problem List   Diagnosis Date Noted  . Muscle strain of lower extremity 11/25/2013  . Chest pain 10/23/2013  . PVD (peripheral vascular disease) with claudication 05/09/2013  . Alcohol consumption heavy 03/28/2013  . GERD (gastroesophageal reflux disease) 03/28/2013  . Pain in limb 01/03/2013  . Lumbago 10/28/2012  . AAA (abdominal aortic aneurysm) without rupture 10/28/2012  . Peripheral vascular disease 02/12/2012  . Anxiety 02/12/2012  . Uncontrolled hypertension 02/02/2012  . Tobacco abuse 02/02/2012  . Financial difficulties 02/02/2012  . Occlusion and stenosis of carotid artery without mention of cerebral infarction 01/19/2012  . Pulmonary nodule 05/19/2011  . HYPERLIPIDEMIA-MIXED 10/19/2008  . OTH MIXED/UNSPEC NONDEPENDENT DRUG ABUSE UNSPEC 10/19/2008  . CAD, ARTERY BYPASS GRAFT 10/19/2008  . DISC DISEASE, LUMBAR 10/19/2008  . CHEST PAIN-UNSPECIFIED 10/19/2008    Medications: reviewed and updated Current Outpatient  Prescriptions  Medication Sig Dispense Refill  . aspirin EC 81 MG tablet Take 81 mg by mouth daily.      [name]. atorvastatin (LIPITOR) 80 MG tablet Take 1 tablet (80 mg total) by mouth daily.  90 tablet  3  . carvedilol (COREG) 12.5 MG tablet Take 12.5 mg by mouth 2 (two) times daily with a meal.      . clopidogrel (PLAVIX) 75 MG tablet Take 75 mg by mouth daily with breakfast.      . cyclobenzaprine (FLEXERIL) 10 MG tablet Take 1 tablet (10 mg total) by mouth 3 (three) times daily as needed for muscle spasms.  30 tablet  0  . hydrALAZINE (APRESOLINE) 25 MG tablet Take 1 tablet (25 mg total) by mouth 3 (three) times daily.  90 tablet  0  . lisinopril-hydrochlorothiazide (PRINZIDE,ZESTORETIC) 20-25 MG per tablet Take 1 tablet by mouth daily.  90 tablet  5  . ranitidine (ZANTAC) 300 MG tablet Take 300 mg by mouth daily.      . traMADol (ULTRAM) 50 MG tablet Take 1 tablet (50 mg total) by mouth every 8 (eight) hours as needed.  20 tablet  0   No current facility-administered medications for this visit.    Objective:  Physical Exam: BP 185/96  Pulse 80  Temp(Src) 98.1 F (36.7 C) (Oral)  Ht 5\' 10"  (1.778 m)  Wt 182 lb 9.6 oz (82.827 kg)  BMI 26.20 kg/m2 Manual BP: 160/85  Gen: Gruff 53 y.o. male in NAD HEENT: MMM, EOMI, PERRL, anicteric sclerae CV: RRR, no MRG, no JVD, DP pulses hardly palpable Resp: Non-labored, CTAB, no  wheezes noted Abd: Soft, NTND, BS present, no guarding or organomegaly; no abd bruit MSK: No edema noted, full ROM Neuro: Alert and oriented, speech normal   Assessment:     Randy [name] is a 53 y.o. male here for uncontrolled HTN.    Plan:     See problem list for problem-specific plans.

## 2013-12-19 NOTE — Assessment & Plan Note (Signed)
Still not at goal on hydralazine despite reporting relatively good compliance, still with symptoms previously attributed to norvasc, clearly this was not the cause of blurry vision. We will return to amlodipine, at a lower dose, and stop hydralazine because I frankly don't think his compliance will allow good control with any medication regimen and I don't want to bottom him out when/if he does take all his medicines.

## 2014-01-11 ENCOUNTER — Ambulatory Visit (INDEPENDENT_AMBULATORY_CARE_PROVIDER_SITE_OTHER): Payer: 59 | Admitting: Family Medicine

## 2014-01-11 ENCOUNTER — Encounter: Payer: Self-pay | Admitting: Family Medicine

## 2014-01-11 VITALS — BP 185/121 | HR 74 | Temp 98.3°F | Wt 181.0 lb

## 2014-01-11 DIAGNOSIS — I10 Essential (primary) hypertension: Secondary | ICD-10-CM

## 2014-01-11 NOTE — Assessment & Plan Note (Addendum)
Uncontrolled, chronic, still stable/worsening with reported symptoms of intermittently decreased cerebral perfusion. Possibly due to early-dose response that isn't sustained (he takes meds quite early in the morning) vs. dehydration (not orthostatic in the office today) vs. Delayed equilibration to lowered BP on new regimen. With BP elevation today I continue to suspect some degree of non-compliance, though these symptoms limit ability to augment anti-hypertensive medications. Expect systolic BP will be difficult to improve given degree of PVD, but diastolic remains troublingly high. Discussed lifestyle modifications at length including decreased salt, increased fruit/vegetables (components of DASH diet), alcohol cessation, and importance of regular aerobic exercise and the impact weight loss will have.

## 2014-01-11 NOTE — Progress Notes (Signed)
Patient ID: Randy [name], male   DOB: 02/18/1961, 53 y.o.   MRN: [id number]   Subjective:  HPI:   Randy [name] is a 53 y.o. male with a history of HTN, MI, PVD here for uncontrolled HTN.   Patient reports symptoms of light-headedness that is sometimes brought on by standing up or by being outside (90+ deg) for extended periods of time, but also while standing inside. Very rarely when sitting. This has happened perhaps slightly more in the past month, though he can't be sure. It has been associated with nausea with stomach content emesis once, but denies "room spinning" symptoms. He also reports chronic changes consistent with presbyopia.   He reports 100% compliance with medications, drinking less over the weekend ("maybe 2-3 bottles of beer") and drinking the same amount of water. He has a salt shaker at the table and uses it "a lot," reports 1 serving of fruit and 2 servings of vegetables per day.  Does not check BP at home. No CP, SOB, HA, scotomata, LE swelling, orthopnea, or palpitations. Still not smoking.   Review of Systems:  Per HPI. All other systems reviewed and are negative.    Past Medical History: Patient Active Problem List   Diagnosis Date Noted  . Muscle strain of lower extremity 11/25/2013  . Chest pain 10/23/2013  . PVD (peripheral vascular disease) with claudication 05/09/2013  . Alcohol consumption heavy 03/28/2013  . GERD (gastroesophageal reflux disease) 03/28/2013  . Pain in limb 01/03/2013  . Lumbago 10/28/2012  . AAA (abdominal aortic aneurysm) without rupture 10/28/2012  . Peripheral vascular disease 02/12/2012  . Anxiety 02/12/2012  . Uncontrolled hypertension 02/02/2012  . Tobacco abuse 02/02/2012  . Financial difficulties 02/02/2012  . Occlusion and stenosis of carotid artery without mention of cerebral infarction 01/19/2012  . Pulmonary nodule 05/19/2011  . HYPERLIPIDEMIA-MIXED 10/19/2008  . OTH MIXED/UNSPEC NONDEPENDENT DRUG ABUSE UNSPEC 10/19/2008    . CAD, ARTERY BYPASS GRAFT 10/19/2008  . DISC DISEASE, LUMBAR 10/19/2008  . CHEST PAIN-UNSPECIFIED 10/19/2008    Medications: reviewed and updated Current Outpatient Prescriptions  Medication Sig Dispense Refill  . amLODipine (NORVASC) 5 MG tablet Take 1 tablet (5 mg total) by mouth daily.  90 tablet  3  . aspirin EC 81 MG tablet Take 81 mg by mouth daily.      [name]. atorvastatin (LIPITOR) 80 MG tablet Take 1 tablet (80 mg total) by mouth daily.  90 tablet  3  . carvedilol (COREG) 12.5 MG tablet Take 12.5 mg by mouth 2 (two) times daily with a meal.      . clopidogrel (PLAVIX) 75 MG tablet Take 75 mg by mouth daily with breakfast.      . cyclobenzaprine (FLEXERIL) 10 MG tablet Take 1 tablet (10 mg total) by mouth 3 (three) times daily as needed for muscle spasms.  30 tablet  0  . lisinopril-hydrochlorothiazide (PRINZIDE,ZESTORETIC) 20-25 MG per tablet Take 1 tablet by mouth daily.  90 tablet  5  . ranitidine (ZANTAC) 300 MG tablet Take 300 mg by mouth daily.      . traMADol (ULTRAM) 50 MG tablet Take 1 tablet (50 mg total) by mouth every 8 (eight) hours as needed.  20 tablet  0   No current facility-administered medications for this visit.    Objective:  Physical Exam: BP 185/121  Pulse 74  Temp(Src) 98.3 F (36.8 C) (Oral)  Wt 181 lb (82.101 kg)  Gen: Chronically ill-appearing 53 y.o. male in NAD HEENT: MMM, poor  dentition. Undilated fundoscopic exam reveals no papilledema and cotton wool spots vs. Drusen bilaterally.  CV: RRR, no MRG, no JVD, no LE edema      Chemistry    Lab Results  Component Value Date   WBC 8.0 10/22/2013   HGB 16.4 10/22/2013   HCT 46.8 10/22/2013   MCV 87.6 10/22/2013   PLT 191 10/22/2013   Assessment:     Randy [name] is a 53 y.o. male here for uncontrolled HTN.     Plan:     See problem list for problem-specific plans.

## 2014-02-08 ENCOUNTER — Telehealth: Payer: Self-pay | Admitting: Family Medicine

## 2014-02-08 ENCOUNTER — Telehealth: Payer: Self-pay | Admitting: *Deleted

## 2014-02-08 ENCOUNTER — Encounter: Payer: Self-pay | Admitting: Family Medicine

## 2014-02-08 ENCOUNTER — Other Ambulatory Visit: Payer: Self-pay | Admitting: *Deleted

## 2014-02-08 ENCOUNTER — Encounter (HOSPITAL_COMMUNITY): Payer: Self-pay | Admitting: Emergency Medicine

## 2014-02-08 ENCOUNTER — Ambulatory Visit (INDEPENDENT_AMBULATORY_CARE_PROVIDER_SITE_OTHER): Payer: 59 | Admitting: Family Medicine

## 2014-02-08 ENCOUNTER — Inpatient Hospital Stay (HOSPITAL_COMMUNITY)
Admission: EM | Admit: 2014-02-08 | Discharge: 2014-02-12 | DRG: 065 | Disposition: A | Discharge status: Home Health | Payer: Medicare Other | Attending: Family Medicine | Admitting: Family Medicine

## 2014-02-08 ENCOUNTER — Ambulatory Visit (HOSPITAL_COMMUNITY)
Admission: RE | Admit: 2014-02-08 | Discharge: 2014-02-08 | Disposition: A | Discharge status: Home / Self Care | Payer: Medicare Other | Source: Ambulatory Visit | Attending: Family Medicine | Admitting: Family Medicine

## 2014-02-08 ENCOUNTER — Emergency Department (HOSPITAL_COMMUNITY): Payer: Medicare Other

## 2014-02-08 VITALS — BP 163/107 | HR 82 | Temp 97.8°F | Ht 70.0 in | Wt 177.0 lb

## 2014-02-08 DIAGNOSIS — R279 Unspecified lack of coordination: Secondary | ICD-10-CM | POA: Insufficient documentation

## 2014-02-08 DIAGNOSIS — I6350 Cerebral infarction due to unspecified occlusion or stenosis of unspecified cerebral artery: Principal | ICD-10-CM | POA: Diagnosis present

## 2014-02-08 DIAGNOSIS — F1910 Other psychoactive substance abuse, uncomplicated: Secondary | ICD-10-CM | POA: Diagnosis present

## 2014-02-08 DIAGNOSIS — F1010 Alcohol abuse, uncomplicated: Secondary | ICD-10-CM | POA: Diagnosis present

## 2014-02-08 DIAGNOSIS — I639 Cerebral infarction, unspecified: Secondary | ICD-10-CM

## 2014-02-08 DIAGNOSIS — I10 Essential (primary) hypertension: Secondary | ICD-10-CM

## 2014-02-08 DIAGNOSIS — Z9119 Patient's noncompliance with other medical treatment and regimen: Secondary | ICD-10-CM

## 2014-02-08 DIAGNOSIS — R278 Other lack of coordination: Secondary | ICD-10-CM

## 2014-02-08 DIAGNOSIS — Z825 Family history of asthma and other chronic lower respiratory diseases: Secondary | ICD-10-CM

## 2014-02-08 DIAGNOSIS — Z87891 Personal history of nicotine dependence: Secondary | ICD-10-CM | POA: Diagnosis not present

## 2014-02-08 DIAGNOSIS — Z7902 Long term (current) use of antithrombotics/antiplatelets: Secondary | ICD-10-CM

## 2014-02-08 DIAGNOSIS — Z833 Family history of diabetes mellitus: Secondary | ICD-10-CM

## 2014-02-08 DIAGNOSIS — F1911 Other psychoactive substance abuse, in remission: Secondary | ICD-10-CM

## 2014-02-08 DIAGNOSIS — R110 Nausea: Secondary | ICD-10-CM

## 2014-02-08 DIAGNOSIS — R51 Headache: Secondary | ICD-10-CM | POA: Insufficient documentation

## 2014-02-08 DIAGNOSIS — R112 Nausea with vomiting, unspecified: Secondary | ICD-10-CM

## 2014-02-08 DIAGNOSIS — I714 Abdominal aortic aneurysm, without rupture, unspecified: Secondary | ICD-10-CM

## 2014-02-08 DIAGNOSIS — I252 Old myocardial infarction: Secondary | ICD-10-CM

## 2014-02-08 DIAGNOSIS — K219 Gastro-esophageal reflux disease without esophagitis: Secondary | ICD-10-CM | POA: Diagnosis present

## 2014-02-08 DIAGNOSIS — F419 Anxiety disorder, unspecified: Secondary | ICD-10-CM

## 2014-02-08 DIAGNOSIS — I2510 Atherosclerotic heart disease of native coronary artery without angina pectoris: Secondary | ICD-10-CM | POA: Diagnosis present

## 2014-02-08 DIAGNOSIS — Z951 Presence of aortocoronary bypass graft: Secondary | ICD-10-CM | POA: Diagnosis not present

## 2014-02-08 DIAGNOSIS — Z91199 Patient's noncompliance with other medical treatment and regimen due to unspecified reason: Secondary | ICD-10-CM | POA: Diagnosis not present

## 2014-02-08 DIAGNOSIS — R42 Dizziness and giddiness: Secondary | ICD-10-CM | POA: Diagnosis not present

## 2014-02-08 DIAGNOSIS — R29898 Other symptoms and signs involving the musculoskeletal system: Secondary | ICD-10-CM

## 2014-02-08 DIAGNOSIS — G8190 Hemiplegia, unspecified affecting unspecified side: Secondary | ICD-10-CM | POA: Diagnosis present

## 2014-02-08 DIAGNOSIS — R269 Unspecified abnormalities of gait and mobility: Secondary | ICD-10-CM

## 2014-02-08 DIAGNOSIS — Z8249 Family history of ischemic heart disease and other diseases of the circulatory system: Secondary | ICD-10-CM | POA: Diagnosis not present

## 2014-02-08 DIAGNOSIS — Z789 Other specified health status: Secondary | ICD-10-CM

## 2014-02-08 DIAGNOSIS — E785 Hyperlipidemia, unspecified: Secondary | ICD-10-CM | POA: Diagnosis present

## 2014-02-08 DIAGNOSIS — Z823 Family history of stroke: Secondary | ICD-10-CM

## 2014-02-08 DIAGNOSIS — I739 Peripheral vascular disease, unspecified: Secondary | ICD-10-CM

## 2014-02-08 DIAGNOSIS — I25810 Atherosclerosis of coronary artery bypass graft(s) without angina pectoris: Secondary | ICD-10-CM

## 2014-02-08 LAB — BASIC METABOLIC PANEL
Anion gap: 13 (ref 5–15)
BUN: 14 mg/dL (ref 6–23)
CHLORIDE: 102 meq/L (ref 96–112)
CO2: 28 meq/L (ref 19–32)
CREATININE: 1.21 mg/dL (ref 0.50–1.35)
Calcium: 9.1 mg/dL (ref 8.4–10.5)
GFR calc non Af Amer: 66 mL/min — ABNORMAL LOW (ref >90)
GFR, EST AFRICAN AMERICAN: 76 mL/min — AB (ref >90)
GLUCOSE: 110 mg/dL — AB (ref 70–99)
Potassium: 4.1 mEq/L (ref 3.7–5.3)
Sodium: 143 mEq/L (ref 137–147)

## 2014-02-08 LAB — CBC WITH DIFFERENTIAL/PLATELET
BASOS PCT: 0 % (ref 0–1)
Basophils Absolute: 0 10*3/uL (ref 0.0–0.1)
EOS ABS: 0.2 10*3/uL (ref 0.0–0.7)
Eosinophils Relative: 2 % (ref 0–5)
HEMATOCRIT: 47 % (ref 39.0–52.0)
HEMOGLOBIN: 16 g/dL (ref 13.0–17.0)
LYMPHS ABS: 2 10*3/uL (ref 0.7–4.0)
Lymphocytes Relative: 25 % (ref 12–46)
MCH: 30 pg (ref 26.0–34.0)
MCHC: 34 g/dL (ref 30.0–36.0)
MCV: 88.2 fL (ref 78.0–100.0)
MONO ABS: 0.7 10*3/uL (ref 0.1–1.0)
MONOS PCT: 8 % (ref 3–12)
Neutro Abs: 5.1 10*3/uL (ref 1.7–7.7)
Neutrophils Relative %: 65 % (ref 43–77)
Platelets: 171 10*3/uL (ref 150–400)
RBC: 5.33 MIL/uL (ref 4.22–5.81)
RDW: 14 % (ref 11.5–15.5)
WBC: 7.9 10*3/uL (ref 4.0–10.5)

## 2014-02-08 LAB — URINALYSIS, ROUTINE W REFLEX MICROSCOPIC
Bilirubin Urine: NEGATIVE
GLUCOSE, UA: NEGATIVE mg/dL
Hgb urine dipstick: NEGATIVE
Ketones, ur: NEGATIVE mg/dL
Leukocytes, UA: NEGATIVE
Nitrite: NEGATIVE
PH: 6 (ref 5.0–8.0)
Protein, ur: NEGATIVE mg/dL
Specific Gravity, Urine: 1.023 (ref 1.005–1.030)
Urobilinogen, UA: 1 mg/dL (ref 0.0–1.0)

## 2014-02-08 LAB — TROPONIN I: Troponin I: <0.3 ng/mL (ref <0.30)

## 2014-02-08 MED ORDER — SUCRALFATE 1 GM/10ML PO SUSP
1.0000 g | Freq: Four times a day (QID) | ORAL | Status: DC | PRN
  Filled 2014-02-08: qty 10

## 2014-02-08 MED ORDER — STROKE: EARLY STAGES OF RECOVERY BOOK
Freq: Once | Status: AC
  Administered 2014-02-08
  Filled 2014-02-08: qty 1

## 2014-02-08 MED ORDER — CLOPIDOGREL BISULFATE 75 MG PO TABS
75.0000 mg | ORAL_TABLET | Freq: Every day | ORAL | Status: DC
  Administered 2014-02-09 – 2014-02-12 (×4): 75 mg via ORAL
  Filled 2014-02-08 (×4): qty 1

## 2014-02-08 MED ORDER — CYCLOBENZAPRINE HCL 10 MG PO TABS: 10.0000 mg | ORAL_TABLET | Freq: Three times a day (TID) | ORAL | Status: DC | PRN

## 2014-02-08 MED ORDER — ASPIRIN EC 81 MG PO TBEC
81.0000 mg | DELAYED_RELEASE_TABLET | Freq: Every day | ORAL | Status: DC
  Administered 2014-02-08 – 2014-02-12 (×5): 81 mg via ORAL
  Filled 2014-02-08 (×5): qty 1

## 2014-02-08 MED ORDER — AMLODIPINE BESYLATE 5 MG PO TABS: 5.0000 mg | ORAL_TABLET | Freq: Every day | ORAL | Status: DC

## 2014-02-08 MED ORDER — MECLIZINE HCL 32 MG PO TABS: 32.0000 mg | ORAL_TABLET | Freq: Three times a day (TID) | ORAL | Status: DC | PRN

## 2014-02-08 MED ORDER — ATORVASTATIN CALCIUM 80 MG PO TABS
80.0000 mg | ORAL_TABLET | Freq: Every day | ORAL | Status: DC
  Administered 2014-02-09 – 2014-02-12 (×4): 80 mg via ORAL
  Filled 2014-02-08 (×4): qty 1

## 2014-02-08 MED ORDER — LISINOPRIL-HYDROCHLOROTHIAZIDE 20-25 MG PO TABS: 1.0000 | ORAL_TABLET | Freq: Every day | ORAL | Status: DC

## 2014-02-08 MED ORDER — ONDANSETRON HCL 4 MG/2ML IJ SOLN
4.0000 mg | Freq: Three times a day (TID) | INTRAMUSCULAR | Status: DC
  Administered 2014-02-08 – 2014-02-10 (×4): 4 mg via INTRAVENOUS
  Filled 2014-02-08 (×4): qty 2

## 2014-02-08 MED ORDER — ONDANSETRON 4 MG PO TBDP: 4.0000 mg | ORAL_TABLET | Freq: Three times a day (TID) | ORAL | Status: DC | PRN

## 2014-02-08 MED ORDER — CARVEDILOL 12.5 MG PO TABS: 12.5000 mg | ORAL_TABLET | Freq: Two times a day (BID) | ORAL | Status: DC

## 2014-02-08 MED ORDER — PROMETHAZINE HCL 25 MG/ML IJ SOLN
12.5000 mg | Freq: Once | INTRAMUSCULAR | Status: AC
  Administered 2014-02-08: 12.5 mg via INTRAMUSCULAR

## 2014-02-08 MED ORDER — ENOXAPARIN SODIUM 40 MG/0.4ML ~~LOC~~ SOLN
40.0000 mg | SUBCUTANEOUS | Status: DC
  Administered 2014-02-08 – 2014-02-11 (×4): 40 mg via SUBCUTANEOUS
  Filled 2014-02-08 (×4): qty 0.4

## 2014-02-08 MED ORDER — AMLODIPINE BESYLATE 5 MG PO TABS
5.0000 mg | ORAL_TABLET | Freq: Every day | ORAL | Status: DC
  Administered 2014-02-09 – 2014-02-12 (×4): 5 mg via ORAL
  Filled 2014-02-08 (×4): qty 1

## 2014-02-08 MED ORDER — GI COCKTAIL ~~LOC~~
30.0000 mL | Freq: Three times a day (TID) | ORAL | Status: DC | PRN
  Administered 2014-02-09 – 2014-02-11 (×2): 30 mL via ORAL
  Filled 2014-02-08 (×3): qty 30

## 2014-02-08 MED ORDER — LISINOPRIL 20 MG PO TABS
20.0000 mg | ORAL_TABLET | Freq: Every day | ORAL | Status: DC
  Administered 2014-02-09 – 2014-02-12 (×4): 20 mg via ORAL
  Filled 2014-02-08 (×4): qty 1

## 2014-02-08 MED ORDER — HYDROCHLOROTHIAZIDE 25 MG PO TABS
25.0000 mg | ORAL_TABLET | Freq: Every day | ORAL | Status: DC
  Administered 2014-02-09 – 2014-02-12 (×4): 25 mg via ORAL
  Filled 2014-02-08 (×4): qty 1

## 2014-02-08 MED ORDER — CARVEDILOL 12.5 MG PO TABS
12.5000 mg | ORAL_TABLET | Freq: Two times a day (BID) | ORAL | Status: DC
  Administered 2014-02-09 – 2014-02-12 (×7): 12.5 mg via ORAL
  Filled 2014-02-08 (×7): qty 1

## 2014-02-08 NOTE — Assessment & Plan Note (Signed)
Patient most likely with subacute right sided cerebellar infarct. Patient has significant comorbidity. Symptoms have not been progressing and remained unchanged. Will obtain outpatient MRI stat. If positive for infarct, will call Neurology for recommendations on outpatient vs inpatient management. Since subacute infarct, and patient is with stable symptoms, do not feel patient needs to be sent to the ED at this point.

## 2014-02-08 NOTE — Consult Note (Signed)
Referring Physician: ED    Chief Complaint: vertigo, nausea, vomiting, unsteadiness, right arm weakness, stroke on MRI.  HPI:                                                                                                                                         Randy [name] is an 53 y.o. male with a past medical history significant for HTN, hyperlipidemia, CAD s/p CABG, MI, carotid artery occlusion, PVD, AAA, here for evaluation of the above stated symptoms and abnormal MRI. Patient said that last Saturday he started having dizziness, nausea, vomiting and imbalance. He is unsure if the right arm was weak on Saturday, but next morning he couldn't use the right arm properly and the nausea, vomiting, vertigo and unsteadiness got worse. When he walks, he notices that he tends to walk to the right side He called his PCP who ordered a brain MRI done at [facility] earlier today which demonstrated findings consistent with right posterior inferior cerebellar artery territory infarct as well as subacute infarction of the RIGHT parietal subcortical white matter. Complains of HA and nausea but denies slurred speech, language or vision impairment. Takes aspirin 81 mg daily. Date last known well: 02/04/14  Time last known well: uncertain  tPA Given: no, late presentation   Past Medical History  Diagnosis Date  . Hypertension   . CAD (coronary artery disease)   . Hx of CABG 2010  . Polysubstance abuse   . Abnormal heart rhythm   . Heart attack 2010  . Hyperlipidemia   . Lung nodule   . Irregular heartbeat   . Shortness of breath     when lying flat on back  . PVD (peripheral vascular disease)   . AAA (abdominal aortic aneurysm) July 2013  . Carotid artery occlusion   . GERD (gastroesophageal reflux disease)   . Anxiety     Past Surgical History  Procedure Laterality Date  . Coronary artery bypass graft  2010  . Endarterectomy Left 01/13/2013    Procedure: ATTEMPTED ENDARTERECTOMY CAROTID;  Surgeon:  Serafina Mitchell, MD;  Location: [facility] OR;  Service: Vascular;  Laterality: Left;  . Carotid endarterectomy Left 01-13-13    Attempted cea    Family History  Problem Relation Age of Onset  . Asthma Mother   . Diabetes Mother   . Hyperlipidemia Mother   . Hypertension Mother   . Lung cancer Father   . Cancer Father   . Stroke Sister    Social History:  reports that he quit smoking about 12 months ago. His smoking use included Cigarettes. He has a 7.5 pack-year smoking history. He has never used smokeless tobacco. He reports that he drinks alcohol. He reports that he does not use illicit drugs.  Allergies: No Known Allergies  Medications:  I have reviewed the patient's current medications.  ROS:                                                                                                                                       History obtained from the patient and chart review.  General ROS: negative for - chills, fatigue, fever, night sweats, weight gain or weight loss Psychological ROS: negative for - behavioral disorder, hallucinations, memory difficulties, mood swings or suicidal ideation Ophthalmic ROS: negative for - blurry vision, double vision, eye pain or loss of vision ENT ROS: negative for - epistaxis, nasal discharge, oral lesions, sore throat, tinnitus or vertigo Allergy and Immunology ROS: negative for - hives or itchy/watery eyes Hematological and Lymphatic ROS: negative for - bleeding problems, bruising or swollen lymph nodes Endocrine ROS: negative for - galactorrhea, hair pattern changes, polydipsia/polyuria or temperature intolerance Respiratory ROS: negative for - cough, hemoptysis, shortness of breath or wheezing Cardiovascular ROS: negative for - chest pain, dyspnea on exertion, edema or irregular heartbeat Gastrointestinal ROS: negative for -  abdominal pain, diarrhea, hematemesis, nausea/vomiting or stool incontinence Genito-Urinary ROS: negative for - dysuria, hematuria, incontinence or urinary frequency/urgency Musculoskeletal ROS: negative for - joint swelling or muscular weakness Neurological ROS: as noted in HPI Dermatological ROS: negative for rash and skin lesion changes  Physical exam: pleasant male in no apparent distress. Blood pressure 167/104, pulse 80, temperature 98.1 F (36.7 C), temperature source Oral, resp. rate 18, height 5' 10"  (1.778 m), weight 81.647 kg (180 lb), SpO2 98.00%. Head: normocephalic. Neck: supple, no bruits, no JVD. Cardiac: no murmurs. Lungs: clear. Abdomen: soft, no tender, no mass. Extremities: no edema. Neurologic Examination:                                                                                                      General: Mental Status: Alert, oriented, thought content appropriate.  Speech fluent without evidence of aphasia.  Able to follow 3 step commands without difficulty. Cranial Nerves: II: Discs flat bilaterally; Visual fields grossly Randy, pupils equal, round, reactive to light and accommodation III,IV, VI: ptosis not present, extra-ocular motions intact bilaterally V,VII: smile symmetric, facial light touch sensation Randy bilaterally VIII: hearing Randy bilaterally IX,X: gag reflex present XI: bilateral shoulder shrug XII: midline tongue extension without atrophy or fasciculations Motor: Significant for mild right arm weakness. Tone and bulk:Randy tone throughout; no atrophy noted Sensory: Pinprick and light touch intact throughout, bilaterally Deep Tendon Reflexes:  Right: Upper Extremity  Left: Upper extremity   biceps (C-5 to C-6) 2/4   biceps (C-5 to C-6) 2/4 tricep (C7) 2/4    triceps (C7) 2/4 Brachioradialis (C6) 2/4  Brachioradialis (C6) 2/4  Lower Extremity Lower Extremity  quadriceps (L-2 to L-4) 2/4   quadriceps (L-2 to L-4) 2/4 Achilles  (S1) 2/4   Achilles (S1) 2/4  Plantars: Right: downgoing   Left: downgoing Cerebellar: Mild right dysmetria and impaired right heel to shin test. Gait:  No tested due to safety reasons.    Results for orders placed during the hospital encounter of 02/08/14 (from the past 48 hour(s))  BASIC METABOLIC PANEL     Status: Abnormal   Collection Time    02/08/14  5:54 PM      Result Value Ref Range   Sodium 143  137 - 147 mEq/L   Potassium 4.1  3.7 - 5.3 mEq/L   Chloride 102  96 - 112 mEq/L   CO2 28  19 - 32 mEq/L   Glucose, Bld 110 (*) 70 - 99 mg/dL   BUN 14  6 - 23 mg/dL   Creatinine, Ser 1.21  0.50 - 1.35 mg/dL   Calcium 9.1  8.4 - 10.5 mg/dL   GFR calc non Af Amer 66 (*) >90 mL/min   GFR calc Af Amer 76 (*) >90 mL/min   Comment: (NOTE)     The eGFR has been calculated using the CKD EPI equation.     This calculation has not been validated in all clinical situations.     eGFR's persistently <90 mL/min signify possible Chronic Kidney     Disease.   Anion gap 13  5 - 15  CBC WITH DIFFERENTIAL     Status: None   Collection Time    02/08/14  5:54 PM      Result Value Ref Range   WBC 7.9  4.0 - 10.5 K/uL   RBC 5.33  4.22 - 5.81 MIL/uL   Hemoglobin 16.0  13.0 - 17.0 g/dL   HCT 47.0  39.0 - 52.0 %   MCV 88.2  78.0 - 100.0 fL   MCH 30.0  26.0 - 34.0 pg   MCHC 34.0  30.0 - 36.0 g/dL   RDW 14.0  11.5 - 15.5 %   Platelets 171  150 - 400 K/uL   Neutrophils Relative % 65  43 - 77 %   Neutro Abs 5.1  1.7 - 7.7 K/uL   Lymphocytes Relative 25  12 - 46 %   Lymphs Abs 2.0  0.7 - 4.0 K/uL   Monocytes Relative 8  3 - 12 %   Monocytes Absolute 0.7  0.1 - 1.0 K/uL   Eosinophils Relative 2  0 - 5 %   Eosinophils Absolute 0.2  0.0 - 0.7 K/uL   Basophils Relative 0  0 - 1 %   Basophils Absolute 0.0  0.0 - 0.1 K/uL  TROPONIN I     Status: None   Collection Time    02/08/14  5:54 PM      Result Value Ref Range   Troponin I <0.30  <0.30 ng/mL   Comment:            Due to the release  kinetics of cTnI,     a negative result within the first hours     of the onset of symptoms does not rule out     myocardial infarction with certainty.     If myocardial infarction is still  suspected,     repeat the test at appropriate intervals.   Mr Brain Wo Contrast  02/08/2014   CLINICAL DATA:  Severe headache and nausea. Dysmetria. History of polysubstance abuse. History of hypertension.  EXAM: MRI HEAD WITHOUT CONTRAST  TECHNIQUE: Multiplanar, multiecho pulse sequences of the brain and surrounding structures were obtained without intravenous contrast.  COMPARISON:  None.  FINDINGS: There are three separate areas of acute infarction affecting the brainstem cerebellum on the RIGHT within the posterior inferior cerebellar artery territory. The dorsal lateral medulla is also involved. Low-level restricted diffusion is seen in the RIGHT parietal subcortical white matter, less intense on DWI, suggesting a fourth lesion occurring days to weeks earlier. Mild cerebellar mass effect with slight RIGHT tonsillar descent and RIGHT-to-LEFT midline shift. No hemorrhage. No intra-axial mass lesion or hydrocephalus. No extra-axial fluid.  Tiny area of chronic LEFT cerebellar infarct. No previous large vessel infarct in the cortex. Randy cerebral volume. Subcortical and periventricular white matter signal abnormality likely chronic microvascular ischemic change mild pannus. Randy pituitary. Flow voids are maintained in the BILATERAL carotid, basilar, and RIGHT vertebral arteries. LEFT vertebral is diminutive and ends in PICA. No acute sinus disease. Mild chronic mucosal thickening throughout the sinuses. BILATERAL mastoid T2 bright fluid likely effusion. No osseous findings.  IMPRESSION: Findings consistent with RIGHT posterior inferior cerebellar artery territory infarct affecting three different locations within this vascular territory. Shower of emboli not excluded. The RIGHT vertebral is the dominant/sole  contributor to the basilar.  Subacute infarction of the RIGHT parietal subcortical white matter, MCA territory.  Mild chronic microvascular ischemic change.  A call is into the ordering provider.   Electronically Signed   By: Rolla Flatten M.D.   On: 02/08/2014 14:41    Assessment: 53 y.o. male with multiple risk factors for stroke, now with right posterior inferior cerebellar artery territory infarcts as well as subacute infarction of the RIGHT parietal subcortical white matter, most likely due to embolism. Out of the window for thrombolysis. Admit to medicine and complete stroke work up. Aspirin 325 mg daily pending results stroke investigations.  Dorian Pod, MD [facility] [phone]  02/08/2014, 6:59 PM

## 2014-02-08 NOTE — Patient Instructions (Signed)
Randy [name], it was a pleasure seeing you today. Today we talked about your nausea, vomiting and coordination symptoms. I will prescribe some medication for your symptoms. I will also get an MRI and lab work.  Please follow-up with your PCP in 2 weeks. I will call you with any update I receive.  If you have any questions or concerns, please do not hesitate to call the office at [phone].  Sincerely,  Jacquelin Hawking, MD

## 2014-02-08 NOTE — Telephone Encounter (Signed)
Patient informed of MRI results. Discussed case with Dr. Pearlean Brownie, stroke team, and would like patient admitted for stroke evaluation. Patient agreeable to plan. Will be driven to [facility] ED with plans for admission to [facility] and stroke team consult.

## 2014-02-08 NOTE — ED Notes (Signed)
Report to Phil RN on 4N.

## 2014-02-08 NOTE — Telephone Encounter (Signed)
I called and spoke to pts relative.  He is at the hospital now.  No clinic appt needed at this time.

## 2014-02-08 NOTE — Progress Notes (Signed)
   Subjective:    Patient ID: Randy [name], male    DOB: 11-20-1960, 53 y.o.   MRN: [id number]  HPI  Patient presents for same day visit.  Nausea and vomiting: Symptoms started on Saturday night. He had dizziness, nausea and emesis. Sunday morning, he noticed some weakness and difficulty controlling the movement of his right arm and was uncoordinated. Dizziness is present when he lays down but is worse when he stands up. A cold wash cloth has helped with symptoms of nausea a little bit. When he walks, he notices that he tends to walk to the right side. No falls. He has a history of CAD s/p CABG. He has no history of stroke. He is compliant with his medication, but has not taken them for the past few days because of nausea. He states he has mild chest pain all the time and has been evaluated in the ED with negative ACS workup.  Noticed weakness and difficulty controlling movements of right arm. Dizziness when laying down but worse when stands up   History  Substance Use Topics  . Smoking status: Former Smoker -- 0.25 packs/day for 30 years    Types: Cigarettes    Quit date: 01/13/2013  . Smokeless tobacco: Never Used  . Alcohol Use: Yes     Comment: occasionally    Review of Systems Per HPI    Objective:   Physical Exam  HEENT: No nystagmus, PERRLA Heart: RRR, no murmur Chest: reproducible chest pain Neurology: Alert, oriented. CN intact with numbness in CN5 V3 area along chin that is chronic. 4/5 grip on right. LE strength equal bilaterally. +dysmetria with right hand. +Romberg with tendency to right side. No dysdiadochokinesia. Gait abnormal with tendency to lean to the right.      Assessment & Plan:

## 2014-02-08 NOTE — ED Notes (Signed)
The pt has had dizziness since Saturday night.  He has had zofran and an injection for the dizziness and the nausea before he came over here.  The pt reports that he always has chest pain and today is  No different.

## 2014-02-08 NOTE — Progress Notes (Signed)
Pt admitted from the ED, denies any pain, put on telemetry and settled in bed, call light at bed side,will continue to monitor. [place]-As[place], [place] Somma Efe

## 2014-02-08 NOTE — ED Notes (Signed)
Family practice physician at pt bedside. Pt has been informed that he is to remain NPO after midnight until his 6am morning blood draw.  Pt does not like to be told he cannot eat and told me that "I have my snacks with me, if I get hungry, I'm going to eat."  Physician told him he need not eat from midnight until morning blood draw.

## 2014-02-08 NOTE — ED Provider Notes (Signed)
CSN: [id number]     Arrival date & time 02/08/14  1642 History   First MD Initiated Contact with Patient 02/08/14 1717     Chief Complaint  Patient presents with  . Dizziness     (Consider location/radiation/quality/duration/timing/severity/associated sxs/prior Treatment) HPI Comments: 53 year old male with history of lipids, CAD, on Plavix, smoker, peripheral vascular disease, AAA without rupture, heavy alcohol abuse sent over with abnormal MRI showing subacute stroke right pica.  Patient denies history of stroke however has significant vascular disease. Patient felt flushed, nausea and mild vomiting and Saturday and Sunday felt that he can move his right arm properly and had decreased strength. No history of similar. Patient denies other neuro symptoms and have been constant since then.  Patient is a 53 y.o. male presenting with dizziness. The history is provided by the patient.  Dizziness Associated symptoms: no headaches, no shortness of breath and no vomiting     Past Medical History  Diagnosis Date  . Hypertension   . CAD (coronary artery disease)   . Hx of CABG 2010  . Polysubstance abuse   . Abnormal heart rhythm   . Heart attack 2010  . Hyperlipidemia   . Lung nodule   . Irregular heartbeat   . Shortness of breath     when lying flat on back  . PVD (peripheral vascular disease)   . AAA (abdominal aortic aneurysm) July 2013  . Carotid artery occlusion   . GERD (gastroesophageal reflux disease)   . Anxiety    Past Surgical History  Procedure Laterality Date  . Coronary artery bypass graft  2010  . Endarterectomy Left 01/13/2013    Procedure: ATTEMPTED ENDARTERECTOMY CAROTID;  Surgeon: Nada LibmanVance W Brabham, MD;  [facility];  Service: Vascular;  Laterality: Left;  . Carotid endarterectomy Left 01-13-13    Attempted cea   Family History  Problem Relation Age of Onset  . Asthma Mother   . Diabetes Mother   . Hyperlipidemia Mother   . Hypertension Mother   . Lung  cancer Father   . Cancer Father   . Stroke Sister    History  Substance Use Topics  . Smoking status: Former Smoker -- 0.25 packs/day for 30 years    Types: Cigarettes    Quit date: 01/13/2013  . Smokeless tobacco: Never Used  . Alcohol Use: Yes     Comment: occasionally    Review of Systems  Constitutional: Negative for fever and chills.  HENT: Negative for congestion.   Eyes: Negative for visual disturbance.  Respiratory: Negative for shortness of breath.   Cardiovascular: Negative for leg swelling.  Gastrointestinal: Negative for vomiting and abdominal pain.  Genitourinary: Negative for dysuria and flank pain.  Musculoskeletal: Negative for back pain, neck pain and neck stiffness.  Skin: Negative for rash.  Neurological: Positive for dizziness, weakness, light-headedness and numbness (left lower leg chronic). Negative for syncope, facial asymmetry and headaches.      Allergies  Review of patient's allergies indicates no known allergies.  Home Medications   Prior to Admission medications   Medication Sig Start Date End Date Taking? Authorizing Provider  amLODipine (NORVASC) 5 MG tablet Take 1 tablet (5 mg total) by mouth daily. 02/08/14   Tyrone Nineyan B Grunz, MD  aspirin EC 81 MG tablet Take 81 mg by mouth daily.    Historical Provider, MD  atorvastatin (LIPITOR) 80 MG tablet Take 1 tablet (80 mg total) by mouth daily. 10/31/13   Tyrone Nineyan B Grunz, MD  carvedilol (COREG)  12.5 MG tablet Take 1 tablet (12.5 mg total) by mouth 2 (two) times daily with a meal. 02/08/14   Tyrone Nine, MD  clopidogrel (PLAVIX) 75 MG tablet Take 75 mg by mouth daily with breakfast.    Historical Provider, MD  cyclobenzaprine (FLEXERIL) 10 MG tablet Take 1 tablet (10 mg total) by mouth 3 (three) times daily as needed for muscle spasms. 10/31/13   Tyrone Nine, MD  lisinopril-hydrochlorothiazide (PRINZIDE,ZESTORETIC) 20-25 MG per tablet Take 1 tablet by mouth daily. 02/08/14   Tyrone Nine, MD  meclizine (ANTIVERT)  32 MG tablet Take 1 tablet (32 mg total) by mouth 3 (three) times daily as needed. 02/08/14   Jacquelin Hawking, MD  ondansetron (ZOFRAN ODT) 4 MG disintegrating tablet Take 1 tablet (4 mg total) by mouth every 8 (eight) hours as needed for nausea or vomiting. 02/08/14   Jacquelin Hawking, MD  ranitidine (ZANTAC) 300 MG tablet Take 300 mg by mouth daily.    Historical Provider, MD  traMADol (ULTRAM) 50 MG tablet Take 1 tablet (50 mg total) by mouth every 8 (eight) hours as needed. 11/25/13   Tyrone Nine, MD   BP 167/104  Pulse 80  Temp(Src) 98.1 F (36.7 C) (Oral)  Resp 18  Ht 5\' 10"  (1.778 m)  Wt 180 lb (81.647 kg)  BMI 25.83 kg/m2  SpO2 98% Physical Exam  Nursing note and vitals reviewed. Constitutional: He is oriented to person, place, and time. He appears well-developed and well-nourished.  HENT:  Head: Normocephalic and atraumatic.  Eyes: Conjunctivae are normal. Right eye exhibits no discharge. Left eye exhibits no discharge.  Neck: Normal range of motion. Neck supple. No tracheal deviation present.  Cardiovascular: Normal rate and regular rhythm.   Pulmonary/Chest: Effort normal and breath sounds normal.  Abdominal: Soft. He exhibits no distension. There is no tenderness. There is no guarding.  Musculoskeletal: He exhibits no edema.  Neurological: He is alert and oriented to person, place, and time. GCS eye subscore is 4. GCS verbal subscore is 5. GCS motor subscore is 6.  Patient has 4/5 weakness right upper extremity, right arm drift. Patient has subjective decreased sensation left lower leg versus right. No facial droop. Normal strength left arm and legs bilateral. Sensation upper extremities fairly equal bilateral. No facial droop. Extra the muscle function intact.  Skin: Skin is warm. No rash noted.  Psychiatric: He has a normal mood and affect.    ED Course  Procedures (including critical care time) Labs Review Labs Reviewed  BASIC METABOLIC PANEL - Abnormal; Notable for the  following:    Glucose, Bld 110 (*)    GFR calc non Af Amer 66 (*)    GFR calc Af Amer 76 (*)    All other components within normal limits  CBC WITH DIFFERENTIAL  TROPONIN I  URINALYSIS, ROUTINE W REFLEX MICROSCOPIC  HEMOGLOBIN A1C  LIPID PANEL  HEMOGLOBIN A1C    Imaging Review Mr Brain Wo Contrast  02/08/2014   CLINICAL DATA:  Severe headache and nausea. Dysmetria. History of polysubstance abuse. History of hypertension.  EXAM: MRI HEAD WITHOUT CONTRAST  TECHNIQUE: Multiplanar, multiecho pulse sequences of the brain and surrounding structures were obtained without intravenous contrast.  COMPARISON:  None.  FINDINGS: There are three separate areas of acute infarction affecting the brainstem cerebellum on the RIGHT within the posterior inferior cerebellar artery territory. The dorsal lateral medulla is also involved. Low-level restricted diffusion is seen in the RIGHT parietal subcortical white matter, less intense  on DWI, suggesting a fourth lesion occurring days to weeks earlier. Mild cerebellar mass effect with slight RIGHT tonsillar descent and RIGHT-to-LEFT midline shift. No hemorrhage. No intra-axial mass lesion or hydrocephalus. No extra-axial fluid.  Tiny area of chronic LEFT cerebellar infarct. No previous large vessel infarct in the cortex. Normal cerebral volume. Subcortical and periventricular white matter signal abnormality likely chronic microvascular ischemic change mild pannus. Normal pituitary. Flow voids are maintained in the BILATERAL carotid, basilar, and RIGHT vertebral arteries. LEFT vertebral is diminutive and ends in PICA. No acute sinus disease. Mild chronic mucosal thickening throughout the sinuses. BILATERAL mastoid T2 bright fluid likely effusion. No osseous findings.  IMPRESSION: Findings consistent with RIGHT posterior inferior cerebellar artery territory infarct affecting three different locations within this vascular territory. Shower of emboli not excluded. The RIGHT  vertebral is the dominant/sole contributor to the basilar.  Subacute infarction of the RIGHT parietal subcortical white matter, MCA territory.  Mild chronic microvascular ischemic change.  A call is into the ordering provider.   Electronically Signed   By: Davonna Belling M.D.   On: 02/08/2014 14:41     EKG Interpretation None      MDM   Final diagnoses:  Stroke   MRI results reviewed,pica subacute stroke. Patient does have persistent findings in the right upper arm and mild dysmetria to right hand. Patient had aspirin today. Blood work ordered and family practice and neurology paged for admission.  The patients results and plan were reviewed and discussed.   Any x-rays performed were personally reviewed by myself.   Differential diagnosis were considered with the presenting HPI.  Medications  aspirin EC tablet 81 mg (81 mg Oral Given 02/08/14 2125)  ondansetron (ZOFRAN) injection 4 mg (4 mg Intravenous Given 02/08/14 1957)  clopidogrel (PLAVIX) tablet 75 mg (not administered)  atorvastatin (LIPITOR) tablet 80 mg (not administered)  cyclobenzaprine (FLEXERIL) tablet 10 mg (not administered)  amLODipine (NORVASC) tablet 5 mg (not administered)  carvedilol (COREG) tablet 12.5 mg (not administered)  enoxaparin (LOVENOX) injection 40 mg (40 mg Subcutaneous Given 02/08/14 2343)  lisinopril (PRINIVIL,ZESTRIL) tablet 20 mg (not administered)    And  hydrochlorothiazide (HYDRODIURIL) tablet 25 mg (not administered)  sucralfate (CARAFATE) 1 GM/10ML suspension 1 g (not administered)  gi cocktail (Maalox,Lidocaine,Donnatal) (30 mLs Oral Given 02/09/14 0020)   stroke: mapping our early stages of recovery book ( Does not apply Given 02/08/14 2343)      Filed Vitals:   02/08/14 2115 02/08/14 2200 02/08/14 2245 02/09/14 0023  BP: 161/89 156/85 148/82 150/92  Pulse: 78 79 72 80  Temp:   98.2 F (36.8 C) 97.9 F (36.6 C)  TempSrc:   Oral Oral  Resp: 17 19 18 18   Height:   5\' 10"  (1.778 m)    Weight:      SpO2: 95% 95% 97% 97%    Admission/ observation were discussed with the admitting physician, patient and/or family and they are comfortable with the plan.      Enid Skeens, MD 02/09/14 [phone]

## 2014-02-09 ENCOUNTER — Encounter (HOSPITAL_COMMUNITY): Payer: Self-pay | Admitting: Radiology

## 2014-02-09 ENCOUNTER — Inpatient Hospital Stay (HOSPITAL_COMMUNITY): Payer: Medicare Other

## 2014-02-09 DIAGNOSIS — I6350 Cerebral infarction due to unspecified occlusion or stenosis of unspecified cerebral artery: Principal | ICD-10-CM

## 2014-02-09 LAB — LIPID PANEL
CHOL/HDL RATIO: 9.3 ratio
CHOLESTEROL: 296 mg/dL — AB (ref 0–200)
HDL: 32 mg/dL — ABNORMAL LOW (ref >39)
LDL Cholesterol: 221 mg/dL — ABNORMAL HIGH (ref 0–99)
TRIGLYCERIDES: 216 mg/dL — AB (ref <150)
VLDL: 43 mg/dL — ABNORMAL HIGH (ref 0–40)

## 2014-02-09 LAB — HEMOGLOBIN A1C
Hgb A1c MFr Bld: 5.5 % (ref <5.7)
Hgb A1c MFr Bld: 5.5 % (ref <5.7)
Mean Plasma Glucose: 111 mg/dL (ref <117)
Mean Plasma Glucose: 111 mg/dL (ref <117)

## 2014-02-09 LAB — RPR

## 2014-02-09 LAB — HIV ANTIBODY (ROUTINE TESTING W REFLEX): HIV: NONREACTIVE

## 2014-02-09 MED ORDER — PROMETHAZINE HCL 25 MG/ML IJ SOLN
12.5000 mg | Freq: Four times a day (QID) | INTRAMUSCULAR | Status: DC | PRN
  Administered 2014-02-09 – 2014-02-10 (×2): 12.5 mg via INTRAVENOUS
  Filled 2014-02-09 (×3): qty 1

## 2014-02-09 MED ORDER — IOHEXOL 350 MG/ML SOLN
50.0000 mL | Freq: Once | INTRAVENOUS | Status: AC | PRN
  Administered 2014-02-09: 50 mL via INTRAVENOUS

## 2014-02-09 NOTE — Progress Notes (Signed)
Seen and examined.  Agree with the documentation and management of Dr. Leonides Schanz.  Randy [name] is admitted with an acute posterior circulation CVA documented by MRI.  Has longstanding history of being a vasculopath and poor control of HBP.  Poor compliance has been strongly suspected.  He states to me that he takes his meds every day as directed.  He has told others that he has been out for (one week or one month).  He may need rehab due to symptoms of vertigo.  Will definitely need better control of cardiovascular risk factors.  On the positive side, he was able to quit smoking one year ago.

## 2014-02-09 NOTE — Plan of Care (Signed)
Problem: Acute Rehab PT Goals(only PT should resolve) Goal: Pt Will Go Supine/Side To Sit Without causing dizziness/instability Goal: Pt Will Transfer Bed To Chair/Chair To Bed With correct hand placement and without dizziness/instability Goal: Pt Will Ambulate On indoor surfaces Goal: Pt Will Go Up/Down Stairs Without depending on hand rails

## 2014-02-09 NOTE — Progress Notes (Signed)
  Echocardiogram 2D Echocardiogram has been performed.  Arvil Chaco 02/09/2014, 10:11 AM

## 2014-02-09 NOTE — Progress Notes (Signed)
Received consult medication needs; patient has private insurance with [facility] with prescription drug coverage; will need HHPT at discharge; CM following for DCP; B Shelba Flake [phone]

## 2014-02-09 NOTE — Progress Notes (Signed)
Pt c/o   Of dizziness and  nausea .  Assisted to the bathroom prior to be sent down for  MCT and noted pt vomitted some brownish emesis   Stated he is ok to go for the CT vs done stable. Pt medicated with Zofran 4mg  2 hrs ago. RN will continue to monitor. Pt sent down for CT.

## 2014-02-09 NOTE — Evaluation (Signed)
Speech Language Pathology Evaluation Patient Details Name: Randy [name] MRN: [id number] DOB: 07/20/1960 Today's Date: 02/09/2014 Time: 1120-1140 SLP Time Calculation (min): 20 min  Problem List:  Patient Active Problem List   Diagnosis Date Noted  . Dysmetria 02/08/2014  . Stroke 02/08/2014  . CVA (cerebral infarction) 02/08/2014  . Muscle strain of lower extremity 11/25/2013  . Chest pain 10/23/2013  . PVD (peripheral vascular disease) with claudication 05/09/2013  . Alcohol consumption heavy 03/28/2013  . GERD (gastroesophageal reflux disease) 03/28/2013  . Pain in limb 01/03/2013  . Lumbago 10/28/2012  . AAA (abdominal aortic aneurysm) without rupture 10/28/2012  . Peripheral vascular disease 02/12/2012  . Anxiety 02/12/2012  . Uncontrolled hypertension 02/02/2012  . Tobacco abuse 02/02/2012  . Financial difficulties 02/02/2012  . Occlusion and stenosis of carotid artery without mention of cerebral infarction 01/19/2012  . Pulmonary nodule 05/19/2011  . HYPERLIPIDEMIA-MIXED 10/19/2008  . OTH MIXED/UNSPEC NONDEPENDENT DRUG ABUSE UNSPEC 10/19/2008  . CAD, ARTERY BYPASS GRAFT 10/19/2008  . DISC DISEASE, LUMBAR 10/19/2008  . CHEST PAIN-UNSPECIFIED 10/19/2008   Past Medical History:  Past Medical History  Diagnosis Date  . Hypertension   . CAD (coronary artery disease)   . Hx of CABG 2010  . Polysubstance abuse   . Abnormal heart rhythm   . Heart attack 2010  . Hyperlipidemia   . Lung nodule   . Irregular heartbeat   . Shortness of breath     when lying flat on back  . PVD (peripheral vascular disease)   . AAA (abdominal aortic aneurysm) July 2013  . Carotid artery occlusion   . GERD (gastroesophageal reflux disease)   . Anxiety    Past Surgical History:  Past Surgical History  Procedure Laterality Date  . Coronary artery bypass graft  2010  . Endarterectomy Left 01/13/2013    Procedure: ATTEMPTED ENDARTERECTOMY CAROTID;  Surgeon: Nada LibmanVance W Brabham, MD;  [facility];  Service: Vascular;  Laterality: Left;  . Carotid endarterectomy Left 01-13-13    Attempted cea   HPI:  Randy [name] is a 53 y.o. male presenting with right-sided weakness. PMH is significant for poorly controlled HTN, hyperlipidemia, CAD s/p CABG, MI, carotid artery occlusion, PVD, AAA. Presented with R UE weakness after getting "too hot" outdoors over the weekend. 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   Assessment / Plan / Recommendation Clinical Impression  Pt appears to be at his baseline for cognitive-linguistic function, with no overt difficulties noted across challenging today. Pt does report that his daughter lives with him and manages his medications for him. Recommend initial supervision during complex cognitive tasks given acute CVA, however no further f/u recommended from SLP at this time.    SLP Assessment  Patient does not need any further Speech Lanaguage Pathology Services    Follow Up Recommendations  None    Frequency and Duration        Pertinent Vitals/Pain Pain Assessment: No/denies pain   SLP Goals     SLP Evaluation Prior Functioning  Cognitive/Linguistic Baseline: Within functional limits Type of Home: House  Lives With: Family;Other (Comment) (multiple other friends/family live in neighborhood too) Available Help at Discharge: Family;Available 24 hours/day Vocation: On disability   Cognition  Overall Cognitive Status: Within Functional Limits for tasks assessed    Comprehension  Auditory Comprehension Overall Auditory Comprehension: Appears within functional limits for tasks assessed Visual Recognition/Discrimination Discrimination: Not tested Reading Comprehension Reading Status: Not tested    Expression Expression Primary Mode of Expression:  Verbal Verbal Expression Overall Verbal Expression: Appears within functional limits for tasks assessed Written Expression Dominant Hand: Right Written Expression: Not tested   Oral /  Motor Oral Motor/Sensory Function Overall Oral Motor/Sensory Function: Appears within functional limits for tasks assessed Motor Speech Overall Motor Speech: Appears within functional limits for tasks assessed   GO       Maxcine Ham, M.A. CCC-SLP [phone]  Maxcine Ham 02/09/2014, 12:14 PM

## 2014-02-09 NOTE — Progress Notes (Signed)
Family Medicine Teaching Service Daily Progress Note Intern Pager: [phone]  Patient name: Randy [name] Medical record number: [id number] Date of birth: [birth year] Age: 53 y.o. Gender: male  Primary Care Provider: Hazeline Junker, MD Consultants: Neurology Code Status: Full  Pt Overview and Major Events to Date:  8/12: Ataxia, right UE weakness since Sunday. MRI noting subacute infarcts  Assessment and Plan: Randy [name] is a 53 y.o. male presenting with right-sided weakness. PMH is significant for poorly controlled HTN, hyperlipidemia, CAD s/p CABG, MI, carotid artery occlusion, PVD, AAA.   Subacute CVA: Well outside the tPA window with improving residual R-sided hemiplegia due to uncontrolled hypertension, vascular disease, and medical non-adherence. MRI/MRA showing R PICA infarct and subacute R parietal subcortical infarct in MCA distribution. Triglycerides 216, LDL 221, HDL 32. - C/s neurology, appreciate recs - Hb A1c prending - Carotid dopplers  - 2D Echocardiogram  - CTA of neck to evaluate vertebral arteries  - Neuro checks per protocol  - PT/OT/SLP  - ASA 325mg , plavix, Lipitor 80mg   - Home health PT, RW w/ 5" wheels   HTN- BP poorly controlled off medications. Allow permissive HTN for now. Will need further education about necessity of medications in the future to prevent further sequelae.  - Restart home lisinopril/HCTZ and amlodipine  - Care management consult for medication needs, and may need HH services.   Substance use: History of chronic marijuana smoking.  - UDS pending   PAD with peripheral occlusions and carotid stenosis s/p R CEA 2012, L CEA attempted 2014 but disease too severe. CT Angio 12/2012 shows chronic occlusion of the left common iliac artery. Has current symptoms of Claudication and is followed by Dr Myra Gianotti [facility]   AAA: Infrarenal abdominal aortic aneurysm has mildly enlarged, measuring 4.3 cm (01/13/13) and  previously measuring 4.1 cm (12/2011) large amount of mural thrombus along the posterior aspect of the aneurysm. There is diffuse atherosclerotic disease throughout the abdominal aorta with irregular plaque. -Continue conservative management (watchful waiting)   GERD: Severe. Swallows baking soda for outpatient tx in addition to PPI.  - PPI, sucralfate, GI cocktail   FEN/GI: SLIV, heart healthy diet  Prophylaxis: Lovenox  Disposition: Pending stroke- work up  Subjective:  Patient doing well. Feels strength is improving. No problems with swallowing.   Objective: Temp:  [97.8 F (36.6 C)-98.5 F (36.9 C)] 98.1 F (36.7 C) (08/13 0600) Pulse Rate:  [72-85] 77 (08/13 0600) Resp:  [14-19] 18 (08/13 0600) BP: (133-175)/(81-134) 139/84 mmHg (08/13 0600) SpO2:  [93 %-99 %] 99 % (08/13 0600) Weight:  [177 lb (80.287 kg)-180 lb (81.647 kg)] 180 lb (81.647 kg) (08/12 1652) Physical Exam: General: 53 y/o gentlemen who feels older than his stated age Cardiovascular: Distant heart sounds. RRR, no m/r/g noted. Minimal DP pulses b/l Respiratory: CTAB. No wheezing, rhonchi, or crackles noted.  Abdomen: +BS, soft, NT/ND Extremities: No rashes or deformities noted Neuro: A&O x4. Speech fluent.  Facial movements symmetric to smile and forehead movements. Sensation over the face, UE, and LE intact and symmetric b/l. 5/5 strength in the upper and lower extremities on my exam (possibly some minor weakness on the R compared to the left however difficult for me to ascertain). Minimal left sided dysmetria with finger to nose.  Laboratory:  Recent Labs Lab 02/08/14 1754  WBC 7.9  HGB 16.0  HCT 47.0  PLT 171    Recent Labs Lab 02/08/14 1754  NA 143  K 4.1  CL 102  CO2 28  BUN 14  CREATININE 1.21  CALCIUM 9.1  GLUCOSE 110*    Imaging/Diagnostic Tests: MRI of the brain 02/08/2014 Findings consistent with RIGHT posterior inferior cerebellar artery territory infarct affecting three different  locations within this vascular territory. Shower of emboli not excluded. The RIGHT vertebral is the dominant/sole contributor to the basilar. Subacute infarction of the RIGHT parietal subcortical white matter, MCA territory. Mild chronic microvascular ischemic change.   MRA of the brain 02/08/2014 1. 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 2. The much smaller left vertebral artery seen only above the left PICA. 3. Moderate to high-grade stenosis of the distal left vertebral artery. 4. Mild narrowing of the distal basilar artery. 5. Asymmetric attenuation of left PCA branch vessels. The left PCA originates from the basilar tip. The right PCA is of fetal type. 6. Atherosclerotic changes within the internal carotid arteries bilaterally without significant stenosis. 7. Mild distal small vessel disease within the anterior circulation.    CXR 10/22/2013 post Cabg. No acute abnormalities. Small hiatal hernia.   Joanna Puffrystal S Kathline Banbury, MD 02/09/2014, 8:09 AM PGY-1, [facility] FPTS Intern pager: [phone], text pages welcome

## 2014-02-09 NOTE — Evaluation (Signed)
Physical Therapy Evaluation Patient Details Name: Randy [name] MRN: [id number] DOB: [birth year] Today's Date: 02/09/2014   History of Present Illness  Randy [name] is a 53 y.o. male presenting with right-sided weakness. PMH is significant for poorly controlled HTN, hyperlipidemia, CAD s/p CABG, MI, carotid artery occlusion, PVD, AAA.  Presented with R UE weakness after getting "too hot" outdoors over the weekend.  History was falling to the R side at home despite pt report.  Clinical Impression  Pt is observed in his room to be very still but once moving tolerated well to sit then stand.  Had most issue with stairs, and after 10 steps with rails was reddened in appearance and dizzy.  Sat and felt better, and due to vascular history PT feels this may be source.  Has 24/7 help at home and will be planning to go as soon as he is permitted.    Follow Up Recommendations Home health PT;Supervision/Assistance - 24 hour    Equipment Recommendations  Rolling walker with 5" wheels    Recommendations for Other Services Other (comment);OT consult (HHPT)     Precautions / Restrictions Precautions Precautions: Fall Precaution Comments: Pt was asked what he should not do to avoid a fall and remembered to ask nsg before any standing. Restrictions Weight Bearing Restrictions: No Other Position/Activity Restrictions: Move slowly to sit up or stand, provide close guarding on walker due to sudden need to sit at times      Mobility  Bed Mobility Overal bed mobility: Needs Assistance Bed Mobility: Rolling;Sidelying to Sit Rolling: Min guard Sidelying to sit: Min guard       General bed mobility comments: slow to protect for dizziness, which did not occur  Transfers Overall transfer level: Needs assistance Equipment used: Rolling walker (2 wheeled) Transfers: Sit to/from [employer] Sit to Stand: Min guard Stand pivot transfers: Min guard           Ambulation/Gait Ambulation/Gait assistance: Min guard Ambulation Distance (Feet): 75 Feet Assistive device: Rolling walker (2 wheeled) Gait Pattern/deviations: Step-through pattern;Wide base of support;Decreased stride length Gait velocity: reduced Gait velocity interpretation: Below normal speed for age/gender General Gait Details: Paced to guard for dizziness which didn't occur with walker gait  Stairs Stairs: Yes Stairs assistance: Min guard Stair Management: Two rails Number of Stairs: 10 General stair comments: Slow pace with pt tolerating well until finished and then felt dizzy, appeared very red and sat to rest.  Color cleared to normal and felt better and able to walk  Wheelchair Mobility    Modified Rankin (Stroke Patients Only) Modified Rankin (Stroke Patients Only) Pre-Morbid Rankin Score: Slight disability Modified Rankin: Moderate disability     Balance Overall balance assessment: Needs assistance Sitting-balance support: Feet supported Sitting balance-Leahy Scale: Fair   Postural control: Right lateral lean Standing balance support: Bilateral upper extremity supported Standing balance-Leahy Scale: Poor Standing balance comment: needs close guarding due to dizziness occurring unexpectedly but did not occur with gait, no dramatic LOB with PT                             Pertinent Vitals/Pain Pain Assessment: No/denies pain Per nsg BP was 139/84, pulse 77 and O2 sat 99%.    Home Living Family/patient expects to be discharged to:: Private residence Living Arrangements: Children Available Help at Discharge: Family;Available 24 hours/day Type of Home: House Home Access: Stairs to enter Entrance Stairs-Rails: None Entrance Stairs-Number of Steps: 2 Home  Layout: One level Home Equipment: Walker - standard;Cane - single point;Tub bench;Hand held shower head      Prior Function Level of Independence: Independent with assistive device(s)          Comments: Used SPC for intermittent claudication BLE's after 30' walking      Hand Dominance   Dominant Hand: Right    Extremity/Trunk Assessment   Upper Extremity Assessment: Overall WFL for tasks assessed           Lower Extremity Assessment: Overall WFL for tasks assessed      Cervical / Trunk Assessment: Normal  Communication   Communication: No difficulties  Cognition Arousal/Alertness: Awake/alert Behavior During Therapy: WFL for tasks assessed/performed Overall Cognitive Status: Within Functional Limits for tasks assessed                      General Comments      Exercises        Assessment/Plan    PT Assessment Patient needs continued PT services  PT Diagnosis Hemiplegia dominant side   PT Problem List Decreased strength;Decreased range of motion;Decreased activity tolerance;Decreased balance;Decreased mobility;Decreased knowledge of use of DME;Cardiopulmonary status limiting activity;Impaired sensation;Other (comment) (unpredictable dizziness)  PT Treatment Interventions DME instruction;Gait training;Stair training;Functional mobility training;Therapeutic activities;Therapeutic exercise;Balance training;Neuromuscular re-education;Patient/family education   PT Goals (Current goals can be found in the Care Plan section) Acute Rehab PT Goals Patient Stated Goal: To get back to outdoor activities PT Goal Formulation: With patient Time For Goal Achievement: 02/16/14 Potential to Achieve Goals: Good    Frequency Min 4X/week   Barriers to discharge Inaccessible home environment requires hand rails to enter house now but also stimulates dizziness to use them    Co-evaluation               End of Session Equipment Utilized During Treatment: Gait belt Activity Tolerance: Treatment limited secondary to medical complications (Comment) Patient left: in chair;with call bell/phone within reach;Other (comment) (Nsg notified) Nurse  Communication: Mobility status;Precautions         Time: [id number] PT Time Calculation (min): 24 min   Charges:   PT Evaluation $Initial PT Evaluation Tier I: 1 Procedure PT Treatments $Gait Training: 8-22 mins   PT G Codes:          Ivar DrapeStout, Aubree Doody E 02/09/2014, 8:55 AM  Samul Dadauth Deaven Urwin, PT MS Acute Rehab Dept. Number: [phone]

## 2014-02-09 NOTE — Progress Notes (Signed)
Stroke Team Progress Note  HISTORY Randy [name] is an 53 y.o. male with a past medical history significant for HTN, hyperlipidemia, CAD s/p CABG, MI, carotid artery occlusion, PVD, AAA, here for evaluation of vertigo, nausea, vomiting, unsteadiness, right arm weakness and abnormal MRI. Patient said that last Saturday 02/04/2014 he started having dizziness, nausea, vomiting and imbalance. He is unsure if the right arm was weak on Saturday, but next morning he couldn't use the right arm properly and the nausea, vomiting, vertigo and unsteadiness got worse. When he walks, he notices that he tends to walk to the right side. He called his PCP who ordered a brain MRI done at [facility] earlier today 02/08/2014 which demonstrated findings consistent with right posterior inferior cerebellar artery territory infarct as well as subacute infarction of the RIGHT parietal subcortical white matter. Complains of HA and nausea but denies slurred speech, language or vision impairment. Takes aspirin 81 mg daily.  Patient was not administered TPA secondary to delay in arrival. He was admitted for further evaluation and treatment.  SUBJECTIVE No family is at the bedside.  Overall he feels his condition is unchanged - he complains of dizziness, light headedness, nausea with standing, blurry vision when lightheaded. He denies double vision, no problems swallowing. He does have trouble controlling his right arm. Drifts to the right when he walks. No trouble with walking.  OBJECTIVE Most recent Vital Signs: Filed Vitals:   02/09/14 0023 02/09/14 0153 02/09/14 0402 02/09/14 0600  BP: 150/92 134/81 133/83 139/84  Pulse: 80 85 85 77  Temp: 97.9 F (36.6 C) 97.8 F (36.6 C) 98.3 F (36.8 C) 98.1 F (36.7 C)  TempSrc: Oral Oral Oral Oral  Resp: 18 18 18 18   Height:      Weight:      SpO2: 97% 96% 95% 99%   CBG (last 3)  No results found for this basename: GLUCAP,  in the last 72 hours  IV Fluid Intake:     MEDICATIONS  .  amLODipine  5 mg Oral Daily  . aspirin EC  81 mg Oral Daily  . atorvastatin  80 mg Oral Daily  . carvedilol  12.5 mg Oral BID WC  . clopidogrel  75 mg Oral Q breakfast  . enoxaparin (LOVENOX) injection  40 mg Subcutaneous Q24H  . lisinopril  20 mg Oral Daily   And  . hydrochlorothiazide  25 mg Oral Daily  . ondansetron (ZOFRAN) IV  4 mg Intravenous 3 times per day   PRN:  cyclobenzaprine, gi cocktail, sucralfate  Diet:  Cardiac thin liquids Activity:  Bedrest DVT Prophylaxis:  Lovenox 40 mg sq daily   CLINICALLY SIGNIFICANT STUDIES Basic Metabolic Panel:  Recent Labs Lab 02/08/14 1754  NA 143  K 4.1  CL 102  CO2 28  GLUCOSE 110*  BUN 14  CREATININE 1.21  CALCIUM 9.1   Liver Function Tests: No results found for this basename: AST, ALT, ALKPHOS, BILITOT, PROT, ALBUMIN,  in the last 168 hours CBC:  Recent Labs Lab 02/08/14 1754  WBC 7.9  NEUTROABS 5.1  HGB 16.0  HCT 47.0  MCV 88.2  PLT 171   Coagulation: No results found for this basename: LABPROT, INR,  in the last 168 hours Cardiac Enzymes:  Recent Labs Lab 02/08/14 1754  TROPONINI <0.30   Urinalysis:  Recent Labs Lab 02/08/14 2129  COLORURINE YELLOW  LABSPEC 1.023  PHURINE 6.0  GLUCOSEU NEGATIVE  HGBUR NEGATIVE  BILIRUBINUR NEGATIVE  KETONESUR NEGATIVE  PROTEINUR NEGATIVE  UROBILINOGEN 1.0  NITRITE NEGATIVE  LEUKOCYTESUR NEGATIVE   Lipid Panel    Component Value Date/Time   CHOL 296* 02/09/2014 0440   TRIG 216* 02/09/2014 0440   HDL 32* 02/09/2014 0440   CHOLHDL 9.3 02/09/2014 0440   VLDL 43* 02/09/2014 0440   LDLCALC 221* 02/09/2014 0440   HgbA1C  Lab Results  Component Value Date   HGBA1C 5.7* 03/19/2011    Urine Drug Screen:     Component Value Date/Time   LABOPIA NONE DETECTED 10/23/2013 0322   LABOPIA POSITIVE* 03/19/2011 1223   COCAINSCRNUR NONE DETECTED 10/23/2013 0322   COCAINSCRNUR NEGATIVE 03/19/2011 1223   LABBENZ NONE DETECTED 10/23/2013 0322   LABBENZ POSITIVE* 03/19/2011 1223    AMPHETMU NONE DETECTED 10/23/2013 0322   AMPHETMU NEGATIVE 03/19/2011 1223   THCU POSITIVE* 10/23/2013 0322   LABBARB NONE DETECTED 10/23/2013 0322    Alcohol Level: No results found for this basename: ETH,  in the last 168 hours   MRI of the brain  02/08/2014   Findings consistent with RIGHT posterior inferior cerebellar artery territory infarct affecting three different locations within this vascular territory. Shower of emboli not excluded. The RIGHT vertebral is the dominant/sole contributor to the basilar.  Subacute infarction of the RIGHT parietal subcortical white matter, MCA territory.  Mild chronic microvascular ischemic change.  A call is into the ordering provider.   MRA of the brain  02/08/2014   1. 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 2. The much smaller left vertebral artery seen only above the left PICA. 3. Moderate to high-grade stenosis of the distal left vertebral artery. 4. Mild narrowing of the distal basilar artery. 5. Asymmetric attenuation of left PCA branch vessels. The left PCA originates from the basilar tip. The right PCA is of fetal type. 6. Atherosclerotic changes within the internal carotid arteries bilaterally without significant stenosis. 7. Mild distal small vessel disease within the anterior circulation.   Carotid Doppler    2D Echocardiogram    CXR  10/22/2013 post Cabg. No acute abnormalities. Small hiatal hernia.   EKG  Sinus tachycardia with irregular rate. Probable left  atrial enlargement. Borderline left axis deviation. Consider anterior infarct. Abnormal T, consider ischemia, lateral leads Artifact in lead(s) I II aVR. For complete results please see formal report.   Therapy Recommendations   Physical Exam   Pleasant middle aged male not in distress.Awake alert. Afebrile. Head is nontraumatic. Neck is supple without bruit. Hearing is normal. Cardiac exam no murmur or gallop. Lungs are clear  to auscultation. Distal pulses are well felt. Neurological Exam : Awake alert oriented x3 with normal speech and language function. Extraocular movements are full range without nystagmus but saccadic dysmetria is present on right gaze. Fundi were not visualized. Vision acuity and fields appear normal. Face is symmetric without weakness. Tongue is midline. Motor system exam reveals no upper or lower extremity drift. No focal weakness. Normal sensation. Mild right upper extremity finger to nose dysmetria present. No lower extremity dysmetria noted. Gait not tested. ASSESSMENT Randy [name] is a 53 y.o. 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  On aspirin 81 mg orally every  day and clopidogrel 75 mg orally every day prior to admission. Now on aspirin 81 mg orally every day and clopidogrel 75 mg orally every day for secondary stroke prevention. Patient with resultant ataxia, nausea, dizziness. Stroke work up underway.  hypertension CAD, hx CABG, MI Hyperlipidemia, LDL 221, on lipitor 80 mg daily PTA, now on lipitor 80 mg dialy, goal LDL < 100  PVD L CEA 12/2012  ETOH use  Family hx stroke (sister)  Hospital day # 1  TREATMENT/PLAN  Continue aspirin 81 mg orally every day and clopidogrel 75 mg orally every day for secondary stroke prevention.  Continue lipitor 80 mg daily for secondary stroke prevention  Home health PT, RW w/ 5" wheels  CT angio neck to look at R [place]  Given medullary infarct, have ST assess swallow  OOB  Annie Main, MSN, RN, ANVP-BC, ANP-BC, GNP-BC [facility] Pager: [phone] 02/09/2014 9:44 AM I have personally examined this  patient, reviewed notes, independently viewed imaging studies, participated in medical decision making and plan of care. I have made any additions or clarifications directly to the above note. Agree with note above. 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 He is at significant risk for worsening of his stroke as well as  recurrent strokes and need vascular imaging and followup and dual antiplatelet therapy for 3 months and aggressive risk factor control Delia Heady, MD Medical Director [facility] Pager: [phone] 02/09/2014 3:12 PM  SIGNED    To contact Stroke Continuity provider, please refer to [email or web address]. After hours, contact General Neurology

## 2014-02-09 NOTE — H&P (Signed)
Seen and examined.  Agree with the documentation and management of Dr. Jarvis Newcomer.  Patient has longstanding severe hypertension and hypercholesterolemia.  Known vasculopath with CAD and PAD.  No presents with vertigo and documented posterior circulation CVA.  Admit and use stroke order set to guide workup.  I suspect medication compliance will be the most critical aspect of his long term prognosis.

## 2014-02-09 NOTE — Evaluation (Signed)
Occupational Therapy Evaluation Patient Details Name: Randy [name] Tolan MRN: [id number] DOB: 12/01/1960 Today's Date: 02/09/2014    History of Present Illness Randy [name] Mcbrearty is a 53 y.o. male presenting with right-sided weakness. PMH is significant for poorly controlled HTN, hyperlipidemia, CAD s/p CABG, MI, carotid artery occlusion, PVD, AAA.  Presented with R UE weakness after getting "too hot" outdoors over the weekend.  History was falling to the R side at home despite pt report.   Clinical Impression   Patient admitted with above. Patient independent PTA. Patient is currently functioning at an overall supervision level for BADLs. Feel patient will benefit from acute OT for education regarding AE/DME and increase overall independence with ADLs, dynamic standing balance tasks, & functional mobility/transfers. Recommend a vestibular evaluation prior to patient d/c > home secondary to patient with increased complaints of dizziness during mobility. No additional follow up OT recommended at this time. Patient states he has 24/7 supervision/assist.     Follow Up Recommendations  No OT follow up    Equipment Recommendations   (Patient states he has shower seat for his tub/shower)    Recommendations for Other Services  (Vestibular Evaluation prior to d/c>home)     Precautions / Restrictions Precautions Precautions: Fall Precaution Comments: Pt was asked what he should not do to avoid a fall and remembered to ask nsg before any standing. Restrictions Weight Bearing Restrictions: No Other Position/Activity Restrictions: Move slowly to sit up or stand, provide close guarding on walker due to sudden need to sit at times      Mobility Bed Mobility Overal bed mobility: Needs Assistance Bed Mobility: Rolling;Sidelying to Sit;Sit to Supine;Supine to Sit Rolling: Supervision Sidelying to sit: Supervision Supine to sit: Supervision Sit to supine: Supervision   General bed mobility comments: patient  with minimal complaints of dizziness during bed mobility  Transfers Overall transfer level: Needs assistance Equipment used: None Transfers: Sit to/from Stand Sit to Stand: Supervision Stand pivot transfers: Min guard       General transfer comment: Patient with increased complaints of dizziness during sit>stands    Balance Overall balance assessment: Needs assistance Sitting-balance support: No upper extremity supported Sitting balance-Leahy Scale: Good   Postural control: Right lateral lean Standing balance support: No upper extremity supported Standing balance-Leahy Scale: Good Standing balance comment: Patient does require close supervision due to dizziness                            ADL Overall ADL's : Needs assistance/impaired     Grooming: Supervision/safety               Lower Body Dressing: Supervision/safety   Toilet Transfer: Supervision/safety             General ADL Comments: Patient engaged in bed mobility and with complaints of increased dizziness. Patient able to adjust socks. Patient stood with RW and ambulated into BR. Patient completed toilet transfer at supervision level, then ambulated > sink without AE(RW) to complete grooming task of brushing teeth in standing position; supervision level. Patient then ambulated back to bed secondary to dizziness. Patient overall supervision for ADLs at this time. Plan to educate patient on overall safety, functional transfers, and use of DME prior to d/c > home.      Vision  Patient with no overt signs of vision impairments during OT evaluation.  Pertinent Vitals/Pain Pain Assessment: No/denies pain     Hand Dominance Right   Extremity/Trunk Assessment Upper Extremity Assessment Upper Extremity Assessment: Generalized weakness;Overall [facility] for tasks assessed   Lower Extremity Assessment Lower Extremity Assessment: Defer to PT evaluation   Cervical /  Trunk Assessment Cervical / Trunk Assessment: Normal   Communication Communication Communication: No difficulties   Cognition Arousal/Alertness: Awake/alert Behavior During Therapy: WFL for tasks assessed/performed Overall Cognitive Status: Within Functional Limits for tasks assessed                                Home Living Family/patient expects to be discharged to:: Private residence Living Arrangements: Children Available Help at Discharge: Family;Available 24 hours/day Type of Home: House Home Access: Stairs to enter [employer] of Steps: 2 back enterance Entrance Stairs-Rails: None Home Layout: One level     Bathroom Shower/Tub: Tub/shower unit Shower/tub characteristics: Engineer, building services: Standard Bathroom Accessibility: Yes How Accessible: Accessible via walker Home Equipment: Emergency planning/management officer - 2 wheels;Cane - single point   Additional Comments: Patient reports he has a sink and tub on either side of toilet and does not want a 3-in1      Prior Functioning/Environment Level of Independence: Independent        Comments: Used SPC for intermittent claudication BLE's after 30' walking     OT Diagnosis: Generalized weakness   OT Problem List: Decreased activity tolerance;Impaired balance (sitting and/or standing);Decreased coordination;Decreased knowledge of use of DME or AE   OT Treatment/Interventions: Self-care/ADL training;Therapeutic exercise;DME and/or AE instruction;Balance training;Patient/family education    OT Goals(Current goals can be found in the care plan section) Acute Rehab OT Goals Patient Stated Goal: none stated OT Goal Formulation: With patient Time For Goal Achievement: 02/16/14 Potential to Achieve Goals: Good ADL Goals Pt Will Perform Grooming: Independently Pt Will Transfer to Toilet: Independently Pt Will Perform Tub/Shower Transfer: Independently;Tub transfer;ambulating;tub bench Pt/caregiver will  Perform Home Exercise Program: Independently (in order to increase strength & coordination>RUE)  OT Frequency: Min 2X/week   Barriers to D/C:  (none known at this time, patient states he will have 24/7 S)             End of Session Equipment Utilized During Treatment: Gait belt;Rolling walker (RW initially, then removed walker and patient without AE)  Activity Tolerance: Patient tolerated treatment well (tolerance decreased secondary to dizziness) Patient left: in bed;with call bell/phone within reach   Time: 1020-1046 OT Time Calculation (min): 26 min Charges:  OT General Charges $OT Visit: 1 Procedure OT Evaluation $Initial OT Evaluation Tier I: 1 Procedure OT Treatments $Self Care/Home Management : 8-22 mins (16) G-Codes:    Perri Aragones, MS, OTR/L, CLT 02/09/2014, 11:19 AM

## 2014-02-09 NOTE — Evaluation (Signed)
Clinical/Bedside Swallow Evaluation Patient Details  Name: Randy [name] MRN: [id number] Date of Birth: 11/03/1960  Today's Date: 02/09/2014 Time: 1105-1120 SLP Time Calculation (min): 15 min  Past Medical History:  Past Medical History  Diagnosis Date  . Hypertension   . CAD (coronary artery disease)   . Hx of CABG 2010  . Polysubstance abuse   . Abnormal heart rhythm   . Heart attack 2010  . Hyperlipidemia   . Lung nodule   . Irregular heartbeat   . Shortness of breath     when lying flat on back  . PVD (peripheral vascular disease)   . AAA (abdominal aortic aneurysm) July 2013  . Carotid artery occlusion   . GERD (gastroesophageal reflux disease)   . Anxiety    Past Surgical History:  Past Surgical History  Procedure Laterality Date  . Coronary artery bypass graft  2010  . Endarterectomy Left 01/13/2013    Procedure: ATTEMPTED ENDARTERECTOMY CAROTID;  Surgeon: Nada LibmanVance W Brabham, MD;  [facility];  Service: Vascular;  Laterality: Left;  . Carotid endarterectomy Left 01-13-13    Attempted cea   HPI:  Randy [name] is a 53 y.o. male presenting with right-sided weakness. PMH is significant for poorly controlled HTN, hyperlipidemia, CAD s/p CABG, MI, carotid artery occlusion, PVD, AAA. Presented with R UE weakness after getting "too hot" outdoors over the weekend. 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 Given location of acute CVA, SLP educated patient regarding general aspiration precautions as well as signs of aspiration. No further f/u is recommended at this time.    Aspiration Risk  Mild    Diet Recommendation Regular;Thin liquid   Liquid Administration via: Cup;Straw Medication Administration: Whole meds with liquid Supervision: Patient able to self feed Compensations: Slow rate;Small sips/bites Postural  Changes and/or Swallow Maneuvers: Seated upright 90 degrees    Other  Recommendations Oral Care Recommendations: Oral care BID   Follow Up Recommendations  None    Frequency and Duration        Pertinent Vitals/Pain n/a    SLP Swallow Goals     Swallow Study Prior Functional Status  Type of Home: House Available Help at Discharge: Family;Available 24 hours/day    General Date of Onset: 02/08/14 HPI: Randy [name] is a 53 y.o. male presenting with right-sided weakness. PMH is significant for poorly controlled HTN, hyperlipidemia, CAD s/p CABG, MI, carotid artery occlusion, PVD, AAA. Presented with R UE weakness after getting "too hot" outdoors over the weekend. 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 Type of Study: Bedside swallow evaluation Previous Swallow Assessment: none in chart Diet Prior to this Study: Regular;Thin liquids Temperature Spikes Noted: No Respiratory Status: Room air History of Recent Intubation: No Behavior/Cognition: Alert;Cooperative;Pleasant mood Oral Cavity - Dentition: Adequate natural dentition Self-Feeding Abilities: Able to feed self Patient Positioning: Upright in bed Baseline Vocal Quality: Clear Volitional Cough: Strong Volitional Swallow: Able to elicit    Oral/Motor/Sensory Function Overall Oral Motor/Sensory Function: Appears within functional limits for tasks assessed   Ice Chips Ice chips: Not tested   Thin Liquid Thin Liquid: Within functional limits Presentation: Self Fed;Straw    Nectar Thick Nectar Thick Liquid: Not tested   Honey Thick Honey Thick Liquid: Not tested   Puree Puree: Within functional limits Presentation: Self Fed;Spoon   Solid   GO  Solid: Within functional limits Presentation: Self Fed        Maxcine Ham, M.A. CCC-SLP [phone]  Maxcine Ham 02/09/2014,12:10 PM

## 2014-02-09 NOTE — Discharge Summary (Signed)
Family Medicine Teaching [facility] Discharge Summary  Patient name: Randy [name] Medical record number: [id number] Date of birth: 1961-05-02 Age: 53 y.o. 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  Respiratory: CTAB. No wheezing, rhonchi, or crackles noted.  Abdomen: soft, NT/ND  Extremities: No rashes or deformities noted  Neuro: grossly oriented, alert. Speech fluent. Sensation over the face symmetric b/l. 5/5 strength in the upper and lower extremities. EOMI. PERRL.  Brief Hospital Course:  Mr. Bagot is a 53 y/o male with a PMH for HTN, hyperlipidemia, CAD s/p CABG, MI, carotid artery occlusion, PVD, AAA presenting with 5 days of vertigo, ataxia (falling to the right), and right arm weakness. He presented to [facility] and a MRI/MRA was obtained from [facility] which revealed a right PICA infarct and subacute right parietal subcortical infarct in MCA distribution. Subsequently, the patient went to the [facility] ED, neurology was consulted and he was admitted for a stroke work up.    The patient was given ASA 325mg  and he was continued on Lipitor 80mg  and plavix 75mg . PT/OT/SLP/Vestibular PT/OT was consulted.CTA revealed diffuse diseased right vertebral artery with a poorly visualized right PICA. There are muscular collaterals of the left vertebral which primarily  supplies the PICA. A proximal right ICA web as also noted. Echocardiogram noted grade 1 diastolic dysfunction and mild concentric hypertrophy.   The patient developed worsening nausea, vomiting, lightheadedness, and dizziness on 8/13. He was started on meclizine 25mg  TID with still persistent nausea and vertigo, so a scopolamine patch was added with good relief.   The patient was advised not to drive until his symptoms have improved and he is re-evaluated by a physician.  Of note, BP was borderline low on day of discharge, possibly related to side effect of scopolamine. His home amlodipine was held upon discharge. He will follow up as an outpatient to determine if this should be restarted  Issues for Follow Up:  -- Eventually will need to stop scopolamine patch -- Continue to re-evaluate patient's functioning to determine when he'd be safe to drive.   -- recheck BP and decide about resuming amlodipine. -- Make sure receiving HH PT services -- needs f/u with neurology in 2 months (Dr. Roda Shutters) as well as to schedule f/u with vascular surgery (Dr. Myra Gianotti) for his carotid disease and AAA.  Significant Procedures: None  Significant Labs and Imaging:   Recent Labs Lab 02/08/14 1754  WBC 7.9  HGB 16.0  HCT 47.0  PLT 171    Recent Labs Lab 02/08/14 1754  NA 143  K 4.1  CL 102  CO2 28  GLUCOSE 110*  BUN 14  CREATININE 1.21  CALCIUM 9.1   Risk Stratification Labs  TSH    Component Value Date/Time   TSH 2.308 02/10/2012 1654   Hemoglobin A1C    Component Value Date/Time   HGBA1C 5.5 02/09/2014 0440   Lipid Panel     Component Value Date/Time   CHOL  296* 02/09/2014 0440   TRIG 216* 02/09/2014 0440   HDL 32* 02/09/2014 0440   CHOLHDL 9.3 02/09/2014 0440   VLDL 43* 02/09/2014 0440   LDLCALC 221* 02/09/2014 0440   RPR neg HIV neg  MRI of the brain 02/08/2014 Findings consistent with RIGHT posterior inferior cerebellar artery territory infarct affecting three different locations  within this vascular territory. Shower of emboli not excluded. The RIGHT vertebral is the dominant/sole contributor to the basilar. Subacute infarction of the RIGHT parietal subcortical white matter, MCA territory. Mild chronic microvascular ischemic change.   MRA of the brain 02/08/2014 1. 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 2. The much smaller left vertebral artery seen only above the left PICA. 3. Moderate to high-grade stenosis of the distal left vertebral artery. 4. Mild narrowing of the distal basilar artery. 5. Asymmetric attenuation of left PCA branch vessels. The left PCA originates from the basilar tip. The right PCA is of fetal type. 6. Atherosclerotic changes within the internal carotid arteries bilaterally without significant stenosis. 7. Mild distal small vessel disease within the anterior circulation.   CXR 10/22/2013 post Cabg. No acute abnormalities. Small hiatal hernia.   CTA of neck 8/13: The RIGHT vertebral is diffusely diseased without visible antegrade contribution to the posterior circulation. RIGHT PICA is poorly visualized. Distal RIGHT vertebral V4 segment appears to fill retrograde from the basilar. LEFT vertebral is reconstituted via muscular collaterals in the neck and primarily supplies the PICA. Rudimentary connection to the basilar. LEFT ICA stent is patent. Proximal RIGHT ICA web. BILATERAL calcific plaque posteriorly in the distal internal carotid arteries could represent old sequelae of dissection.   Echocardiogram 8/13:Mild concentric hypertrophy of the LV. Possible hypokinesis of the inferior myocardium. Grade I diastolic dysfunction.    Results/Tests Pending at Time of Discharge: none  Discharge Medications:    Medication List    STOP taking these medications       amLODipine 5 MG tablet  Commonly known as:  NORVASC     ibuprofen 200 MG tablet  Commonly known as:  ADVIL,MOTRIN     OVER  THE COUNTER MEDICATION      TAKE these medications       aspirin EC 81 MG tablet  Take 81 mg by mouth daily.     atorvastatin 80 MG tablet  Commonly known as:  LIPITOR  Take 1 tablet (80 mg total) by mouth daily.     carvedilol 12.5 MG tablet  Commonly known as:  COREG  Take 1 tablet (12.5 mg total) by mouth 2 (two) times daily with a meal.     clopidogrel 75 MG tablet  Commonly known as:  PLAVIX  Take 75 mg by mouth daily with breakfast.     cyclobenzaprine 10 MG tablet  Commonly known as:  FLEXERIL  Take 1 tablet (10 mg total) by mouth 3 (three) times daily as needed for muscle spasms.     lisinopril-hydrochlorothiazide 20-25 MG per tablet  Commonly known as:  PRINZIDE,ZESTORETIC  Take 1 tablet by mouth daily.     meclizine 25 MG tablet  Commonly known as:  ANTIVERT  Take 1 tablet (25 mg total) by mouth 3 (three) times daily as needed for dizziness.     ondansetron 8 MG disintegrating tablet  Commonly known as:  ZOFRAN ODT  Take 1 tablet (8 mg total) by mouth every 8 (eight) hours as needed for nausea or vomiting.     scopolamine 1 MG/3DAYS  Commonly known as:  TRANSDERM-SCOP  Place 1 patch (1.5 mg total) onto the skin every 3 (three) days.        Discharge Instructions: Please refer to Patient Instructions section of EMR for full details.  Patient was counseled important signs and symptoms that should prompt return to medical care, changes in medications, dietary instructions, activity restrictions, and follow up appointments.   Follow-Up Appointments: Follow-up Information   Follow up with Xu,Jindong, MD. Schedule an appointment as soon as possible for a visit in 2 months. ([facility]roke [facility], Dr. Roda Shutters is a stroke MD and Dr. Marlis Edelson partner)    Specialty:  Neurology   Contact information:   [address] [place] [address] [phone]       Follow up with [facility]. (for home health physical therapy)    Contact information:    [address] [place] [address] [phone]       Follow up with Hazeline Junker, MD On 02/17/2014. (at 9:15am for hospital follow up)    Specialty:  Family Medicine   Contact information:   [address] [place] [address] [phone]       Call [name], MD. (schedule an appointment within the next few weeks to follow up on your carotid stenosis and abdominal aneurysym.)    Specialty:  Vascular Surgery   Contact information:   [address] [place] [address] [phone]       Latrelle Dodrill, MD 02/13/2014, 11:55 AM PGY-3, [facility]

## 2014-02-09 NOTE — Progress Notes (Signed)
t returned from CT to unit no more nausea or vomiting noted at this time however MD on call made aware of the vommiting prior to CT scan no new orders given.

## 2014-02-09 NOTE — H&P (Signed)
Family Medicine Teaching [facility] Admission History and Physical Service Pager: [phone]  Patient name: Randy [name] Medical record number: [id number] Date of birth: 24-Mar-1961 Age: 53 y.o. Gender: male  Primary Care Provider: Hazeline Junker, MD Consultants: Neurology Code Status: Full  Chief Complaint: Right-sided weakness  Assessment and Plan: Randy [name] is a 53 y.o. male presenting with right-sided weakness. PMH is significant for poorly controlled HTN, hyperlipidemia, CAD s/p CABG, MI, carotid artery occlusion, PVD, AAA.  Subacute CVA: Well outside tPA window with improving residual R-sided hemiplegia due to uncontrolled hypertensive and vascular disease and medical non-adherence. MRI/MRA showing R PICA infarct and subacute R parietal subcortical infarct in MCA distribution.   - Admit to FMTS, Dr. Leveda Anna attending, on telemetry floor - Hb A1c, lipids  - Carotid dopplers  - 2D Echocardiogram  - Neuro checks per protocol - PT/OT/SLP - ASA 325mg , plavix, high-intensity statin - Permissive HTN  HTN- BP poorly controlled off medications. Allow permissive HTN for now. Will need further education about necessity of medications in the future to prevent further sequelae. - Restart home medications tomorrow. - Care management consult for medication needs, and may need HH services.   Substance use: History of chronic marijuana smoking.  - UDS  PAD with peripheral occlusions and carotid stenosis s/p R CEA 2012, L CEA attempted 2014 but disease too severe. CT Angio 12/2012 shows chronic occlusion of the left common iliac artery. Has current symptoms of Claudication and is followed by Dr Myra Gianotti [facility]  AAA: Infrarenal abdominal aortic aneurysm has mildly enlarged, measuring 4.3 cm (01/13/13) and previously measuring 4.1 cm (12/2011) large amount of mural thrombus along the posterior aspect of the aneurysm. There is diffuse atherosclerotic disease  throughout the abdominal aorta with irregular plaque. Continue conservative management (watchful waiting)   GERD: Severe. Swallows baking soda for outpatient tx in addition to PPI.  - PPI, sucralfate, GI cocktail  FEN/GI: SLIV, heart healthy diet once passed swallow screen. Prophylaxis: Lovenox  Disposition: Admit to FMTS, Dr. Leveda Anna attending for stroke work up.   History of Present Illness: Randy [name] is a 53 y.o. male presenting with right-sided weakness that started 4 days prior to presentation.   Randy [name] reports being out of medications for the past several weeks without attempting to have refills. He was working outside on Saturday when he believes he became dehydrated and began developing symptoms overnight into Sunday (8/9) consisting of trouble walking (falling to the right consistently) associated with dizziness causing nausea with vomiting and vertigo. He noticed right-sided weakness as well in his upper body more than lower body, and denies feeling like he had speech impairment or vision changes at any point.   He denies recent illnesses, preceding neurological symptoms. No unusual headaches, no dysphagia. No dyspnea or chest pain on exertion.  No abdominal pain, change in bowel habits, black or bloody stools.  No urinary tract symptoms.  No new or unusual musculoskeletal symptoms.   Review Of Systems: Per HPI Otherwise 12 point review of systems was performed and was unremarkable.  Patient Active Problem List   Diagnosis Date Noted  . Dysmetria 02/08/2014  . Stroke 02/08/2014  . CVA (cerebral infarction) 02/08/2014  . Muscle strain of lower extremity 11/25/2013  . Chest pain 10/23/2013  . PVD (peripheral vascular disease) with claudication 05/09/2013  . Alcohol consumption heavy 03/28/2013  . GERD (gastroesophageal reflux disease) 03/28/2013  . Pain in limb 01/03/2013  . Lumbago 10/28/2012  . AAA (abdominal  aortic aneurysm) without rupture 10/28/2012  . Peripheral  vascular disease 02/12/2012  . Anxiety 02/12/2012  . Uncontrolled hypertension 02/02/2012  . Tobacco abuse 02/02/2012  . Financial difficulties 02/02/2012  . Occlusion and stenosis of carotid artery without mention of cerebral infarction 01/19/2012  . Pulmonary nodule 05/19/2011  . HYPERLIPIDEMIA-MIXED 10/19/2008  . OTH MIXED/UNSPEC NONDEPENDENT DRUG ABUSE UNSPEC 10/19/2008  . CAD, ARTERY BYPASS GRAFT 10/19/2008  . DISC DISEASE, LUMBAR 10/19/2008  . CHEST PAIN-UNSPECIFIED 10/19/2008   Past Medical History: Past Medical History  Diagnosis Date  . Hypertension   . CAD (coronary artery disease)   . Hx of CABG 2010  . Polysubstance abuse   . Abnormal heart rhythm   . Heart attack 2010  . Hyperlipidemia   . Lung nodule   . Irregular heartbeat   . Shortness of breath     when lying flat on back  . PVD (peripheral vascular disease)   . AAA (abdominal aortic aneurysm) July 2013  . Carotid artery occlusion   . GERD (gastroesophageal reflux disease)   . Anxiety    Past Surgical History: Past Surgical History  Procedure Laterality Date  . Coronary artery bypass graft  2010  . Endarterectomy Left 01/13/2013    Procedure: ATTEMPTED ENDARTERECTOMY CAROTID;  Surgeon: Nada Libman, MD;  Location[facility] OR;  Service: Vascular;  Laterality: Left;  . Carotid endarterectomy Left 01-13-13    Attempted cea   Social History: History  Substance Use Topics  . Smoking status: Former Smoker -- 0.25 packs/day for 30 years    Types: Cigarettes    Quit date: 01/13/2013  . Smokeless tobacco: Never Used  . Alcohol Use: Yes     Comment: occasionally   Additional social history: No longer smokes cigarettes.   Please also refer to relevant sections of EMR.  Family History: Family History  Problem Relation Age of Onset  . Asthma Mother   . Diabetes Mother   . Hyperlipidemia Mother   . Hypertension Mother   . Lung cancer Father   . Cancer Father   . Stroke Sister    Allergies and  Medications: No Known Allergies No current facility-administered medications on file prior to encounter.   Current Outpatient Prescriptions on File Prior to Encounter  Medication Sig Dispense Refill  . amLODipine (NORVASC) 5 MG tablet Take 1 tablet (5 mg total) by mouth daily.  90 tablet  3  . aspirin EC 81 MG tablet Take 81 mg by mouth daily.      [name] atorvastatin (LIPITOR) 80 MG tablet Take 1 tablet (80 mg total) by mouth daily.  90 tablet  3  . carvedilol (COREG) 12.5 MG tablet Take 1 tablet (12.5 mg total) by mouth 2 (two) times daily with a meal.  90 tablet  3  . clopidogrel (PLAVIX) 75 MG tablet Take 75 mg by mouth daily with breakfast.      . cyclobenzaprine (FLEXERIL) 10 MG tablet Take 1 tablet (10 mg total) by mouth 3 (three) times daily as needed for muscle spasms.  30 tablet  0  . lisinopril-hydrochlorothiazide (PRINZIDE,ZESTORETIC) 20-25 MG per tablet Take 1 tablet by mouth daily.  90 tablet  5    Objective: BP 139/84  Pulse 77  Temp(Src) 98.1 F (36.7 C) (Oral)  Resp 18  Ht 5\' 10"  (1.778 m)  Wt 180 lb (81.647 kg)  BMI 25.83 kg/m2  SpO2 99% Exam: General: Chronically ill-appearing male in NAD HEENT: NCAT< MMM, sclerae normal, oropharynx clear, poor dentition Cardiovascular:  RRR, no murmur, distant sounds, no JVD, barely palpable DP pulses with cold feet and slow cap refill.  Respiratory: Non-labored on ambient air, CTAB Abdomen: +BS, soft, NT, ND Extremities: No deformities or edema Skin: No rashes, wounds, or masses Neurologic Exam Alert and oriented x4, CN II-XII intact grossly, speech is normal, gait not assessed due to vestibular symptoms and right hemiparesis, strength on L 5/5 throughout, on R 3-4/5, sensation intact to light touch throughout, some left-sided dysmetria without dysdiadochokinesia, patellar DTRs 2+  Labs and Imaging: CBC BMET   Recent Labs Lab 02/08/14 1754  WBC 7.9  HGB 16.0  HCT 47.0  PLT 171    Recent Labs Lab 02/08/14 1754  NA 143   K 4.1  CL 102  CO2 28  BUN 14  CREATININE 1.21  GLUCOSE 110*  CALCIUM 9.1    Randy [name] Head Wo Contrast  02/08/2014   CLINICAL DATA:  Posterior fossa infarcts. Right parietal lobe infarct. Recent sudden onset of dizziness.  EXAM: MRA HEAD WITHOUT CONTRAST  TECHNIQUE: Angiographic images of the Circle of Willis were obtained using MRA technique without intravenous contrast.  COMPARISON:  MRI brain from the same day.  FINDINGS: Atherosclerotic irregularity is present within the precavernous and cavernous internal carotid arteries bilaterally without a significant stenosis relative to the more distal vessel. A fetal type right posterior cerebral artery is noted. The A1 and M1 segments are normal. The MCA bifurcations are intact. The ACA and MCA branch vessels demonstrate mild distal small vessel disease.  The right vertebral artery is occluded. Is reconstituted 8 mm proximal to the vertebrobasilar junction. This appears to have been the dominant vessel. Flow is present within the left PICA. The smaller left vertebral artery seen only above the level of the left PICA with a moderate to high-grade stenosis distally. The AICA vessels are noted bilaterally. The basilar artery is somewhat small distally. The left posterior cerebral artery originates from the basilar tip. There is asymmetric attenuation of left PCA branch vessels. The right posterior cerebral artery is of fetal type.  IMPRESSION: 1. 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 2. The much smaller left vertebral artery seen only above the left PICA. 3. Moderate to high-grade stenosis of the distal left vertebral artery. 4. Mild narrowing of the distal basilar artery. 5. Asymmetric attenuation of left PCA branch vessels. The left PCA originates from the basilar tip. The right PCA is of fetal type. 6. Atherosclerotic changes within the internal carotid arteries bilaterally without significant  stenosis. 7. Mild distal small vessel disease within the anterior circulation. These results were called by telephone at the time of interpretation on 02/08/2014 at 8:59 pm to Dr. Noel ChristmasHARLES STEWART , who verbally acknowledged these results.   Electronically Signed   By: Gennette Pachris  Mattern M.D.   On: 02/08/2014 21:07   Randy Brain Wo Contrast  02/08/2014   CLINICAL DATA:  Severe headache and nausea. Dysmetria. History of polysubstance abuse. History of hypertension.  EXAM: MRI HEAD WITHOUT CONTRAST  TECHNIQUE: Multiplanar, multiecho pulse sequences of the brain and surrounding structures were obtained without intravenous contrast.  COMPARISON:  None.  FINDINGS: There are three separate areas of acute infarction affecting the brainstem cerebellum on the RIGHT within the posterior inferior cerebellar artery territory. The dorsal lateral medulla is also involved. Low-level restricted diffusion is seen in the RIGHT parietal subcortical white matter, less intense on DWI, suggesting a fourth lesion occurring days to weeks earlier. Mild cerebellar  mass effect with slight RIGHT tonsillar descent and RIGHT-to-LEFT midline shift. No hemorrhage. No intra-axial mass lesion or hydrocephalus. No extra-axial fluid.  Tiny area of chronic LEFT cerebellar infarct. No previous large vessel infarct in the cortex. Normal cerebral volume. Subcortical and periventricular white matter signal abnormality likely chronic microvascular ischemic change mild pannus. Normal pituitary. Flow voids are maintained in the BILATERAL carotid, basilar, and RIGHT vertebral arteries. LEFT vertebral is diminutive and ends in PICA. No acute sinus disease. Mild chronic mucosal thickening throughout the sinuses. BILATERAL mastoid T2 bright fluid likely effusion. No osseous findings.  IMPRESSION: Findings consistent with RIGHT posterior inferior cerebellar artery territory infarct affecting three different locations within this vascular territory. Shower of emboli not  excluded. The RIGHT vertebral is the dominant/sole contributor to the basilar.  Subacute infarction of the RIGHT parietal subcortical white matter, MCA territory.  Mild chronic microvascular ischemic change.  A call is into the ordering provider.   Electronically Signed   By: Davonna Belling M.D.   On: 02/08/2014 14:41   Tyrone Nine, MD 02/09/2014, 6:45 AM PGY-2, [facility] FPTS Intern pager: [phone], text pages welcome

## 2014-02-10 DIAGNOSIS — F411 Generalized anxiety disorder: Secondary | ICD-10-CM

## 2014-02-10 DIAGNOSIS — F1010 Alcohol abuse, uncomplicated: Secondary | ICD-10-CM

## 2014-02-10 MED ORDER — MECLIZINE HCL 12.5 MG PO TABS
12.5000 mg | ORAL_TABLET | Freq: Three times a day (TID) | ORAL | Status: DC
  Administered 2014-02-10: 12.5 mg via ORAL
  Filled 2014-02-10: qty 1

## 2014-02-10 MED ORDER — MECLIZINE HCL 12.5 MG PO TABS
25.0000 mg | ORAL_TABLET | Freq: Three times a day (TID) | ORAL | Status: DC
  Administered 2014-02-10 – 2014-02-12 (×6): 25 mg via ORAL
  Filled 2014-02-10 (×6): qty 2

## 2014-02-10 MED ORDER — ONDANSETRON 8 MG/NS 50 ML IVPB
8.0000 mg | Freq: Three times a day (TID) | INTRAVENOUS | Status: DC
  Administered 2014-02-10 – 2014-02-12 (×7): 8 mg via INTRAVENOUS
  Filled 2014-02-10 (×12): qty 8

## 2014-02-10 MED ORDER — SODIUM CHLORIDE 0.9 % IV SOLN
INTRAVENOUS | Status: DC
  Administered 2014-02-10 (×2): via INTRAVENOUS

## 2014-02-10 NOTE — Progress Notes (Signed)
Pt vomited again for the third time tonight,earlier had iv phenergan 12.5mg  at 2030, slept thereafter and suddenly woke up feeling nauseated and vomited, Dr Nadine Counts (on call) paged and notified, ordered to give zofran and to commence pt on iv fluid of NS at , same started, pt reassured, will continue to monitor. [place], Randy [name]

## 2014-02-10 NOTE — Progress Notes (Signed)
Family Medicine Teaching Service Daily Progress Note Intern Pager: [phone]  Patient name: Randy [name] Medical record number: [id number] Date of birth: 08/15/1960 Age: 53 y.o. Gender: male  Primary Care Provider: Hazeline JunkerGrunz, Ryan, MD Consultants: Neurology Code Status: Full  Pt Overview and Major Events to Date:  8/12: Ataxia, right UE weakness since Sunday. MRI noting subacute infarcts  Assessment and Plan: Randy [name] is a 53 y.o. male presenting with right-sided weakness. PMH is significant for poorly controlled HTN, hyperlipidemia, CAD s/p CABG, MI, carotid artery occlusion, PVD, AAA.   Subacute CVA: Well outside the tPA window with improving residual R-sided hemiplegia due to uncontrolled hypertension, vascular disease, and medical non-adherence. MRI/MRA showing R PICA infarct and subacute R parietal subcortical infarct in MCA distribution. Triglycerides 216, LDL 221, HDL 32. - Neurology consulted, appreciate recs - Neuro checks per protocol  - PT/OT/SLP: No further needs from OT and SLP, Home health PT, RW w/ 5" wheels,  - ASA 81mg , plavix, Lipitor 80mg   - Given nausea, vomiting, and increased lightheadedness and dizziness with sitting up, will place scheduled meclizine.  - If meclizine doesn't work consider scopolamine. - Patient should not drive until okayed by neurology    HTN- BP poorly controlled off medications. Allow permissive HTN for now. Will need further education about necessity of medications in the future to prevent further sequelae.  - Continue home lisinopril/HCTZ and amlodipine  - Care management consult for medication needs, and may need HH services.   PAD with peripheral occlusions and carotid stenosis s/p R CEA 2012, L CEA attempted 2014 but disease too severe. CT Angio 12/2012 shows chronic occlusion of the left common iliac artery. Has current symptoms of Claudication and is followed [facility]abham [facility]   AAA: Infrarenal  abdominal aortic aneurysm has mildly enlarged, measuring 4.3 cm (01/13/13) and previously measuring 4.1 cm (12/2011) large amount of mural thrombus along the posterior aspect of the aneurysm. There is diffuse atherosclerotic disease throughout the abdominal aorta with irregular plaque. -Continue conservative management (watchful waiting)   GERD: Severe. Swallows baking soda for outpatient tx in addition to PPI.  - PPI, sucralfate, GI cocktail   FEN/GI: SLIV, heart healthy diet  Prophylaxis: Lovenox  Disposition: Pending stroke- work up  Subjective:  Patient not doing well O/N. Endorses N/V while lying down. Lightheaded and dizzy on sitting up.   Objective: Temp:  [97.5 F (36.4 C)-98.4 F (36.9 C)] 97.5 F (36.4 C) (08/14 0607) Pulse Rate:  [63-102] 65 (08/14 0607) Resp:  [18-20] 20 (08/14 0607) BP: (110-137)/(60-90) 113/75 mmHg (08/14 0607) SpO2:  [95 %-100 %] 97 % (08/14 52840607) Physical Exam: General: 53 y/o gentlemen who feels older than his stated age Cardiovascular: Distant heart sounds. RRR, no m/r/g noted. Minimal DP pulses b/l Respiratory: CTAB. No wheezing, rhonchi, or crackles noted.  Abdomen: +BS, soft, NT/ND Extremities: No rashes or deformities noted Neuro: A&O x4. Speech fluent.  Facial movements symmetric to smile and forehead movements. Sensation over the face, UE, and LE intact and symmetric b/l. 5/5 strength in the upper and lower extremities on my exam (possibly some minor weakness on the R compared to the left however difficult for me to ascertain). Minimal left sided dysmetria with finger to nose.  Laboratory:  Recent Labs Lab 02/08/14 1754  WBC 7.9  HGB 16.0  HCT 47.0  PLT 171    Recent Labs Lab 02/08/14 1754  NA 143  K 4.1  CL 102  CO2 28  BUN 14  CREATININE 1.21  CALCIUM 9.1  GLUCOSE 110*   Risk Stratification Labs  TSH    Component Value Date/Time   TSH 2.308 02/10/2012 1654   Hemoglobin A1C    Component Value Date/Time   HGBA1C 5.5  02/09/2014 0440   Lipid Panel     Component Value Date/Time   CHOL 296* 02/09/2014 0440   TRIG 216* 02/09/2014 0440   HDL 32* 02/09/2014 0440   CHOLHDL 9.3 02/09/2014 0440   VLDL 43* 02/09/2014 0440   LDLCALC 221* 02/09/2014 0440   RPR neg HIV neg  Imaging/Diagnostic Tests: MRI of the brain 02/08/2014 Findings consistent with RIGHT posterior inferior cerebellar artery territory infarct affecting three different locations within this vascular territory. Shower of emboli not excluded. The RIGHT vertebral is the dominant/sole contributor to the basilar. Subacute infarction of the RIGHT parietal subcortical white matter, MCA territory. Mild chronic microvascular ischemic change.   MRA of the brain 02/08/2014 1. 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 2. The much smaller left vertebral artery seen only above the left PICA. 3. Moderate to high-grade stenosis of the distal left vertebral artery. 4. Mild narrowing of the distal basilar artery. 5. Asymmetric attenuation of left PCA branch vessels. The left PCA originates from the basilar tip. The right PCA is of fetal type. 6. Atherosclerotic changes within the internal carotid arteries bilaterally without significant stenosis. 7. Mild distal small vessel disease within the anterior circulation.   CXR 10/22/2013 post Cabg. No acute abnormalities. Small hiatal hernia.   CTA of neck 8/13: The RIGHT vertebral is diffusely diseased without visible antegrade contribution to the posterior circulation. RIGHT PICA is poorly visualized. Distal RIGHT vertebral V4 segment appears to fill retrograde from the basilar. LEFT vertebral is reconstituted via muscular collaterals in the neck and primarily supplies the PICA. Rudimentary connection to the basilar. LEFT ICA stent is patent. Proximal RIGHT ICA web. BILATERAL calcific plaque posteriorly in the distal internal carotid arteries could represent old sequelae  of dissection.  Echocardiogram 8/13:Mild concentric hypertrophy of the LV. Possible hypokinesis of the inferior myocardium. Grade I diastolic dysfunction.    Joanna Puff, MD 02/10/2014, 7:04 AM PGY-1, [facility] Family [facility] FPTS Intern pager: [phone], text pages welcome

## 2014-02-10 NOTE — Progress Notes (Signed)
Patient is not eligible for [facility] services because his primary insurance is not in network for [employer] services.  He is noted to be a [employer] employee on the demographic page.  He indicates that this is incorrect.  He has a Personal assistant pending.  His daughter Herbert Seta is his primary caregiver assisting him with this matter.  Of note, [facility] Care Management services does not replace or interfere with any services that are arranged by inpatient case management or social work.  For additional questions or referrals please contact Anibal Henderson BSN RN [facility] Liaison at [phone].

## 2014-02-10 NOTE — Progress Notes (Signed)
Occupational Therapy Treatment Patient Details Name: Randy [name] Montelongo MRN: [id number] DOB: 06/18/1961 Today's Date: 02/10/2014    History of present illness Randy [name] Uber is a 53 y.o. male admitted 02/08/14 presenting with right-sided weakness, nausea, vomiting, and unsteadiness. 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  Presented with R UE weakness after getting "too hot" outdoors over the weekend.  History was falling to the R side at home despite pt report.   OT comments  Patient received supine in bed with daughter at bedside. Patient with no complaints of pain, but stated "I feel like I'm going to get sick if I try to move". Therapist administered theraputty and educated patient on a RUE HEP for fine and gross motor strengthening to enhance overall improvement in RUE function. Therapist reinforced importance of using RUE during functional tasks and as much as possible to increase strength & coordination. Patient then sat EOB for NT to check vitals and patient with 6/10 complaints of dizziness, therapist educated patient on focalizing on one object. After a few seconds, patient's complaints of dizziness decreased > 4/10. Encouraged patient to focus on a still object if dizzy and refrain from any mobility until dizziness subsided. Will continue to educate on vestibular issues. Therapist also educated patient on importance of 24/7 supervision at home and use of shower seat in tub/shower unit. Patient's daughter educated on all above. Both patient and daughter seemed receptive to education. Patient will continue to benefit from OT acute services to enhance overall performance with ADLs, functional mobility, functional transfers, and functional strengthening/coordination > RUE. LTGs set at an overall independent level.   Follow Up  Recommendations  No OT follow up    Equipment Recommendations   (patient states he has a shower seat, recommend that for tub)    Recommendations for Other Services  (none at this time)    Precautions / Restrictions Precautions Precautions: Fall Restrictions Weight Bearing Restrictions: No       Mobility Bed Mobility Overal bed mobility: Needs Assistance Bed Mobility: Rolling;Supine to Sit Rolling: Supervision (for verbal cues secondary to vestibular issues) Sidelying to sit: Supervision (for verbal cues secondary to vestibular issues ) Supine to sit: Supervision (for verbal cues secondary to vestibular issues)   Sit to sidelying: Supervision General bed mobility comments: Pt supervision for all bed mobility. After rolling to the left pt went from sidelying to sit and began vomiting.  Transfers Overall transfer level: Needs assistance Equipment used: None Transfers: Sit to/from Stand Sit to Stand: Min guard         General transfer comment: Pt min guard for pt safety when standing from EOB as symptomatic after vestibular testing (nausea/vomiting).    Balance Overall balance assessment: Needs assistance Sitting-balance support: Feet supported Sitting balance-Leahy Scale: Good     Standing balance support: Single extremity supported Standing balance-Leahy Scale: Poor Standing balance comment: Pt min guard on standing as very symptomatic today                   ADL                                         General ADL Comments:  (ADL tasks not completed )  Vision  No apparent deficits during functional tasks. Please see PT vestibular evaluation for more information.                           Cognition   Behavior During Therapy: WFL for tasks assessed/performed Overall Cognitive Status: Within Functional Limits for tasks assessed                                    Pertinent Vitals/ Pain       Pain  Assessment: No/denies pain         Frequency Min 2X/week     Progress Toward Goals  OT Goals(current goals can now be found in the care plan section)  Progress towards OT goals: Progressing toward goals  Acute Rehab OT Goals Patient Stated Goal:  (none stated) OT Goal Formulation: With patient/family  Plan Discharge plan remains appropriate          Activity Tolerance Other (comment) (central vestibular issues)   Patient Left in bed;with call bell/phone within reach;with nursing/sitter in room;with family/visitor present     Time: 8366-2947 OT Time Calculation (min): 16 min  Charges: OT General Charges $OT Visit: 1 Procedure OT Treatments $Therapeutic Activity: 8-22 mins (16)  Khamauri Bauernfeind , MS, OTR/L, CLT [phone] 02/10/2014, 2:19 PM

## 2014-02-10 NOTE — Progress Notes (Signed)
Seen and examined.  Agree with the documentation and management of Dr. Leonides Schanz.  Still quite vertiginous with CVA.  BP fine.  Will push antivert.  DC when tolerating PO and safe ambulation.

## 2014-02-10 NOTE — Progress Notes (Signed)
Stroke Team Progress Note  HISTORY Randy [name] is an 53 y.o. male with a past medical history significant for HTN, hyperlipidemia, CAD s/p CABG, MI, carotid artery occlusion, PVD, AAA, here for evaluation of vertigo, nausea, vomiting, unsteadiness, right arm weakness and abnormal MRI. Patient said that last Saturday 02/04/2014 he started having dizziness, nausea, vomiting and imbalance. He is unsure if the right arm was weak on Saturday, but next morning he couldn't use the right arm properly and the nausea, vomiting, vertigo and unsteadiness got worse. When he walks, he notices that he tends to walk to the right side. He called his PCP who ordered a brain MRI done at [facility] earlier today 02/08/2014 which demonstrated findings consistent with right posterior inferior cerebellar artery territory infarct as well as subacute infarction of the RIGHT parietal subcortical white matter. Complains of HA and nausea but denies slurred speech, language or vision impairment. Takes aspirin 81 mg daily.  Patient was not administered TPA secondary to delay in arrival. He was admitted for further evaluation and treatment.  SUBJECTIVE I keep throwing up and cannot eat anything. Surprised to hear he may go home. Ct angio shows severe atherosclerotic changes and RVA occlusion and hypoplastic LVA  OBJECTIVE Most recent Vital Signs: Filed Vitals:   02/09/14 2142 02/10/14 0155 02/10/14 0607 02/10/14 0942  BP: 114/64 135/86 113/75 116/73  Pulse: 72 63 65 68  Temp: 98.4 F (36.9 C) 97.8 F (36.6 C) 97.5 F (36.4 C) 97.7 F (36.5 C)  TempSrc: Oral Oral Oral Oral  Resp: 20 20 20 20   Height:      Weight:      SpO2: 95% 96% 97% 95%   CBG (last 3)  No results found for this basename: GLUCAP,  in the last 72 hours  IV Fluid Intake:   . sodium chloride 100 mL/hr at 02/10/14 0139    MEDICATIONS  . amLODipine  5 mg Oral Daily  . aspirin EC  81 mg Oral Daily  . atorvastatin  80 mg Oral Daily  . carvedilol  12.5 mg Oral  BID WC  . clopidogrel  75 mg Oral Q breakfast  . enoxaparin (LOVENOX) injection  40 mg Subcutaneous Q24H  . lisinopril  20 mg Oral Daily   And  . hydrochlorothiazide  25 mg Oral Daily  . meclizine  12.5 mg Oral TID  . ondansetron (ZOFRAN) IV  8 mg Intravenous 3 times per day   PRN:  cyclobenzaprine, gi cocktail, promethazine, sucralfate  Diet:  Cardiac thin liquids Activity: OOB DVT Prophylaxis:  Lovenox 40 mg sq daily   CLINICALLY SIGNIFICANT STUDIES Basic Metabolic Panel:   Recent Labs Lab 02/08/14 1754  NA 143  K 4.1  CL 102  CO2 28  GLUCOSE 110*  BUN 14  CREATININE 1.21  CALCIUM 9.1   Liver Function Tests: No results found for this basename: AST, ALT, ALKPHOS, BILITOT, PROT, ALBUMIN,  in the last 168 hours CBC:   Recent Labs Lab 02/08/14 1754  WBC 7.9  NEUTROABS 5.1  HGB 16.0  HCT 47.0  MCV 88.2  PLT 171   Coagulation: No results found for this basename: LABPROT, INR,  in the last 168 hours Cardiac Enzymes:   Recent Labs Lab 02/08/14 1754  TROPONINI <0.30   Urinalysis:   Recent Labs Lab 02/08/14 2129  COLORURINE YELLOW  LABSPEC 1.023  PHURINE 6.0  GLUCOSEU NEGATIVE  HGBUR NEGATIVE  BILIRUBINUR NEGATIVE  KETONESUR NEGATIVE  PROTEINUR NEGATIVE  UROBILINOGEN 1.0  NITRITE NEGATIVE  LEUKOCYTESUR NEGATIVE   Lipid Panel    Component Value Date/Time   CHOL 296* 02/09/2014 0440   TRIG 216* 02/09/2014 0440   HDL 32* 02/09/2014 0440   CHOLHDL 9.3 02/09/2014 0440   VLDL 43* 02/09/2014 0440   LDLCALC 221* 02/09/2014 0440   HgbA1C  Lab Results  Component Value Date   HGBA1C 5.5 02/09/2014    Urine Drug Screen:     Component Value Date/Time   LABOPIA NONE DETECTED 10/23/2013 0322   LABOPIA POSITIVE* 03/19/2011 1223   COCAINSCRNUR NONE DETECTED 10/23/2013 0322   COCAINSCRNUR NEGATIVE 03/19/2011 1223   LABBENZ NONE DETECTED 10/23/2013 0322   LABBENZ POSITIVE* 03/19/2011 1223   AMPHETMU NONE DETECTED 10/23/2013 0322   AMPHETMU NEGATIVE 03/19/2011 1223    THCU POSITIVE* 10/23/2013 0322   LABBARB NONE DETECTED 10/23/2013 0322    Alcohol Level: No results found for this basename: ETH,  in the last 168 hours   MRI of the brain  02/08/2014   Findings consistent with RIGHT posterior inferior cerebellar artery territory infarct affecting three different locations within this vascular territory. Shower of emboli not excluded. The RIGHT vertebral is the dominant/sole contributor to the basilar.  Subacute infarction of the RIGHT parietal subcortical white matter, MCA territory.  Mild chronic microvascular ischemic change.  A call is into the ordering provider.   MRA of the brain  02/08/2014   1. 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 2. The much smaller left vertebral artery seen only above the left PICA. 3. Moderate to high-grade stenosis of the distal left vertebral artery. 4. Mild narrowing of the distal basilar artery. 5. Asymmetric attenuation of left PCA branch vessels. The left PCA originates from the basilar tip. The right PCA is of fetal type. 6. Atherosclerotic changes within the internal carotid arteries bilaterally without significant stenosis. 7. Mild distal small vessel disease within the anterior circulation.   CT angiogram neck The RIGHT vertebral is diffusely diseased without visible antegrade contribution to the posterior circulation. RIGHT PICA is poorly visualized. Distal RIGHT vertebral V4 segment appears to fill retrograde from the basilar. LEFT vertebral is reconstituted via muscular collaterals in the neck and primarily supplies the PICA. Rudimentary connection to the basilar. LEFT ICA stent is patent. Proximal RIGHT ICA web. BILATERAL calcific plaque posteriorly in the distal internal carotid arteries could represent old sequelae of dissection.   2D Echocardiogram  The cavity size was normal. There was mild concentric hypertrophy. Systolic function was normal. Possible  hypokinesis of the inferior myocardium. Doppler parameters are consistent with abnormal left ventricular relaxation (grade 1 diastolic dysfunction).  CXR  10/22/2013 post Cabg. No acute abnormalities. Small hiatal hernia.   EKG  Sinus tachycardia with irregular rate. Probable left  atrial enlargement. Borderline left axis deviation. Consider anterior infarct. Abnormal T, consider ischemia, lateral leads Artifact in lead(s) I II aVR. For complete results please see formal report.   Therapy Recommendations HH PT w/ RW  Physical Exam   Pleasant middle aged male not in distress.Awake alert. Afebrile. Head is nontraumatic. Neck is supple without bruit. Hearing is normal. Cardiac exam no murmur or gallop. Lungs are clear to auscultation. Distal pulses are well felt. Neurological Exam : Awake alert oriented x3 with normal speech and language function. Extraocular movements are full range without nystagmus but saccadic dysmetria is present on right gaze. Fundi were not visualized. Vision acuity and fields appear normal. Face is symmetric without weakness. Tongue is midline. Motor system exam  reveals no upper or lower extremity drift. No focal weakness. Normal sensation. Mild right upper extremity finger to nose dysmetria present. No lower extremity dysmetria noted. Gait not tested.  ASSESSMENT Mr. Randy [name] is a 53 y.o. 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  On aspirin 81 mg orally every day and clopidogrel 75 mg orally every day prior to admission. Now on aspirin 81 mg orally every day and clopidogrel 75 mg orally every day for secondary stroke prevention. Patient with resultant ataxia, nausea, dizziness. No dysphagia per ST. Stroke work up completed.  hypertension CAD, hx CABG, MI Hyperlipidemia,  LDL 221, on lipitor 80 mg daily PTA, now on lipitor 80 mg dialy, goal LDL < 100  PVD L CEA 12/2012  ETOH use  Family hx stroke (sister)  Hospital day # 2  TREATMENT/PLAN  Continue aspirin 81 mg orally every day and clopidogrel 75 mg orally every day for secondary stroke prevention.  Continue lipitor 80 mg daily for secondary stroke prevention  Home health PT, RW w/ 5" wheels  Ongoing control of nausea  Ok for discharge from stroke standpoint  No further stroke workup indicated.  Patient has a 10-15% risk of having another stroke over the next year, the highest risk is within 2 weeks of the most recent stroke/TIA (risk of having a stroke following a stroke or TIA is the same).  Ongoing risk factor control by Primary Care Physician  Stroke Service will sign off. Please call should any needs arise.  Follow up with Dr. Roda ShuttersXu, Stroke Clinic, in 2 months.   Annie MainSHARON BIBY, MSN, RN, ANVP-BC, ANP-BC, Lawernce IonGNP-BC [facility] Pager: [phone] 02/10/2014 10:04 AM I have personally examined this patient, reviewed notes, independently viewed imaging studies, participated in medical decision making and plan of care. I have made any additions or clarifications directly to the above note. Agree with note above.   Delia HeadyPramod Sethi, MD Medical Director [facility] Pager: [phone] 02/10/2014 1:48 PM    To contact Stroke Continuity provider, please refer to [email or web address]. After hours, contact General Neurology

## 2014-02-10 NOTE — Progress Notes (Signed)
*  PRELIMINARY RESULTS* Vascular Ultrasound Carotid Duplex (Doppler) has been completed.  Preliminary findings:   Right = 60-79% ICA stenosis. Moderate homogenous plaque noted at bulb and ICA. Atypical vertebral waveform, suggesting a distal obstruction. Left = Significant subclavian stenosis and ECA stenosis. >50% ICA stenosis, based on stent criteria. Antegrade vertebral flow.   Farrel Demark, RDMS, RVT  02/10/2014, 1:41 PM

## 2014-02-10 NOTE — Progress Notes (Signed)
VM left with Anibal Henderson RN with [facility] to see if patient can qualify for their services after discharge; Call back requested; Alexis Goodell [phone]

## 2014-02-10 NOTE — Progress Notes (Signed)
Talked to patient about DCP/ home health care choices, patient chose [facility] for HHPT/ [facility] with [facility] called for referral; Alexis Goodell [phone]

## 2014-02-10 NOTE — Progress Notes (Signed)
Read, reviewed, edited and agree with student's findings and recommendations.  Markise Haymer B. Montrail Mehrer, PT, DPT #[phone]  

## 2014-02-10 NOTE — Progress Notes (Signed)
Physical Therapy Treatment Patient Details Name: Randy [name] MRN: [id number] DOB: 02/27/1961 Today's Date: 02/10/2014    History of Present Illness Randy PalmerKeith Bowler is a 53 y.o. male admitted 02/08/14 presenting with right-sided weakness, nausea, vomiting, and unsteadiness. 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  Presented with R UE weakness after getting "too hot" outdoors over the weekend.  History was falling to the R side at home despite pt report.    PT Comments    Pt session limited due to nausea/vomiting. After horizontal roll test left pt became symptomatic (nausea) and vomiting when sitting EOB afterwards. Pt had rigidity when tracking and appeared to be more symptomatic with vertical VOR. Pt has symptoms continually which consist of nausea/vomiting that he "does not feel like going around in circles." Findings from vestibular eval are consistent with MRI findings and suspect pt's symptoms are due to central rather than peripheral issues. Plan vestibular treatment including mobilization, habituation, compensation, and symptom management. PT recommends HHPT and 24 hour supervision to decrease pt's falls risk and increase pt's safety at home.  Follow Up Recommendations  Home health PT;Supervision/Assistance - 24 hour     Equipment Recommendations  Rolling walker with 5" wheels       Precautions / Restrictions Precautions Precautions: Fall Restrictions Weight Bearing Restrictions: No    Mobility  Bed Mobility Overal bed mobility: Needs Assistance Bed Mobility: Rolling;Sidelying to Sit;Sit to Sidelying Rolling: Supervision Sidelying to sit: Supervision     Sit to sidelying: Supervision General bed mobility comments: Pt supervision for all bed mobility. After rolling to the left pt went from sidelying  to sit and began vomiting.  Transfers Overall transfer level: Needs assistance Equipment used: None Transfers: Sit to/from Stand Sit to Stand: Min guard         General transfer comment: Pt min guard for pt safety when standing from EOB as symptomatic after vestibular testing (nausea/vomiting).      Modified Rankin (Stroke Patients Only) Modified Rankin (Stroke Patients Only) Pre-Morbid Rankin Score: Slight disability Modified Rankin: Moderate disability     Balance Overall balance assessment: Needs assistance Sitting-balance support: Feet supported Sitting balance-Leahy Scale: Good     Standing balance support: Single extremity supported Standing balance-Leahy Scale: Poor Standing balance comment: Pt min guard on standing as very symptomatic today                    Cognition Arousal/Alertness: Awake/alert Behavior During Therapy: WFL for tasks assessed/performed Overall Cognitive Status: Within Functional Limits for tasks assessed                         General Comments General comments (skin integrity, edema, etc.): Continuous dizziness given medicine for it. No head trauma or URI. No glasses. Nausea vomiting dizziness started Saturday night. Sit up or standing up gets sick but laying down doesn't get sick. Prefers to sleep on right but left doesn't bother him. No hearing loss. Has had nausea medication this morning through IV. When gets up has blurry vision. Still doesn't feel dizzy. Helps to close eyes.Positive symptoms (nausea) but negative nystagmus horizontal roll test left. Feels like going to throw up when moving, but doesn't feel like going around in circles. Rigidity with tracking. States horizontal VOR makes symptoms better but vertical VOR appears to make  the pt feels worse.       Pertinent Vitals/Pain Pain Assessment: No/denies pain (Nausea/vomiting)           PT Goals (current goals can now be found in the care plan section) Acute  Rehab PT Goals Patient Stated Goal: To stop getting sick PT Goal Formulation: With patient Time For Goal Achievement: 02/16/14 Potential to Achieve Goals: Good    Frequency  Min 4X/week    PT Plan Current plan remains appropriate       End of Session Equipment Utilized During Treatment: Gait belt Activity Tolerance: Treatment limited secondary to medical complications (Comment) (Nausea/vomiting) Patient left: in bed;with call bell/phone within reach     Time: 0086-7619 PT Time Calculation (min): 24 min  Charges:  $Therapeutic Activity: 23-37 mins                    G CodesMardi Mainland, [place] [phone]

## 2014-02-11 MED ORDER — DIAZEPAM 5 MG PO TABS: 5.0000 mg | ORAL_TABLET | Freq: Three times a day (TID) | ORAL | Status: DC | PRN

## 2014-02-11 MED ORDER — SCOPOLAMINE 1 MG/3DAYS TD PT72
1.0000 | MEDICATED_PATCH | TRANSDERMAL | Status: DC
  Administered 2014-02-11: 1.5 mg via TRANSDERMAL
  Filled 2014-02-11: qty 1

## 2014-02-11 NOTE — Progress Notes (Signed)
Physical Therapy Treatment Patient Details Name: Randy [name] MRN: [id number] DOB: 22-Aug-1960 Today's Date: 02/11/2014    History of Present Illness Gaudencio [name] is a 53 y.o. male admitted 02/08/14 presenting with right-sided weakness, nausea, vomiting, and unsteadiness. 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  Presented with R UE weakness after getting "too hot" outdoors over the weekend.  History was falling to the R side at home despite pt report.    PT Comments    Pt educated on compensation technique. Able to progress mobility and demo more stability with RW. Denied any dizziness this session. Recommend use of RW upon D/C home.   Follow Up Recommendations  Home health PT;Supervision/Assistance - 24 hour     Equipment Recommendations  None recommended by PT (pt has RW at home )    Recommendations for Other Services Other (comment);OT consult     Precautions / Restrictions Precautions Precautions: None Restrictions Weight Bearing Restrictions: No    Mobility  Bed Mobility Overal bed mobility: Needs Assistance Bed Mobility: Sidelying to Sit   Sidelying to sit: Supervision       General bed mobility comments: cues for compensation technique; to focus eyes on one spot on wall to prevent dizziness; no physical (A) needed  Transfers Overall transfer level: Needs assistance Equipment used: None Transfers: Sit to/from Stand Sit to Stand: Min guard         General transfer comment: pt unsteady with standing and reaching for UE support; min guard to steady; more stable with RW  Ambulation/Gait Ambulation/Gait assistance: Supervision Ambulation Distance (Feet): 120 Feet Assistive device: Rolling walker (2 wheeled) Gait Pattern/deviations: Step-through pattern;Decreased stride length Gait  velocity: decr due to fear of nausea Gait velocity interpretation: Below normal speed for age/gender General Gait Details: cues for compensation techniques to prevent dizziness; denied any dizziness with mobility; more stable with RW; recommend use of RW at home   Stairs            Wheelchair Mobility    Modified Rankin (Stroke Patients Only) Modified Rankin (Stroke Patients Only) Pre-Morbid Rankin Score: Slight disability Modified Rankin: Moderate disability     Balance Overall balance assessment: Needs assistance Sitting-balance support: Feet supported;No upper extremity supported Sitting balance-Leahy Scale: Good     Standing balance support: During functional activity;Bilateral upper extremity supported Standing balance-Leahy Scale: Poor Standing balance comment: pt more stable when standing with RW; begins to sway without UE support                    Cognition Arousal/Alertness: Awake/alert Behavior During Therapy: WFL for tasks assessed/performed Overall Cognitive Status: Within Functional Limits for tasks assessed                      Exercises      General Comments General comments (skin integrity, edema, etc.): reviewed compensation techniques throughout session; denied any dizziness      Pertinent Vitals/Pain Pain Assessment: No/denies pain    Home Living                      Prior Function            PT Goals (current goals can now be found in the care plan section) Acute Rehab PT Goals Patient Stated Goal: to not get sick  PT Goal Formulation: With patient Time For Goal Achievement: 02/16/14 Potential to Achieve Goals: Good Progress towards PT goals: Progressing toward goals    Frequency  Min 4X/week    PT Plan Current plan remains appropriate    Co-evaluation             End of Session Equipment Utilized During Treatment: Gait belt Activity Tolerance: Patient tolerated treatment well Patient left: in  chair;with call bell/phone within reach     Time: 1047-1101 PT Time Calculation (min): 14 min  Charges:  $Gait Training: 8-22 mins                    G CodesDonnamarie Poag:      [place], [place]PT  [phone] 02/11/2014, 12:06 PM

## 2014-02-11 NOTE — Progress Notes (Signed)
Seen and examined.  Agree with the management and documentation of Dr. Pollie Meyer.  Feeling some better.  No vomiting today.  Tolerating PO.  Likely DC tomorrow if this improvement is sustained.

## 2014-02-11 NOTE — Progress Notes (Signed)
OT Cancellation Note  Patient Details Name: Coburn Lenger MRN: [id number] DOB: [birth year]   Cancelled Treatment:    Reason Eval/Treat Not Completed: Patient declined, no reason specified. Pt lying on side in recliner with wet rag on his forehead and reports dizziness and nausea. Pt declined OT at this time and will follow up as available for treatment.   Rae Lips [phone] 02/11/2014, 11:41 AM

## 2014-02-11 NOTE — Progress Notes (Signed)
Occupational Therapy Treatment Patient Details Name: Randy [name] MRN: [id number] DOB: 1961/06/12 Today's Date: 02/11/2014    History of present illness Randy [name] is a 53 y.o. male admitted 02/08/14 presenting with right-sided weakness, nausea, vomiting, and unsteadiness. 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  Presented with R UE weakness after getting "too hot" outdoors over the weekend.  History was falling to the R side at home despite pt report.   OT comments  Pt seen today to address functional mobility in the context of ADLs. Pt agreeable to ambulating and states "that's the only way this will get better." Educated pt on safety with use of DME and during ADLs through fall prevention techniques and energy conservation principles. Pt stated understanding. Pt is eager to return home.   Follow Up Recommendations  No OT follow up    Equipment Recommendations  None recommended by OT    Recommendations for Other Services  N/A    Precautions / Restrictions Precautions Precautions: None Restrictions Weight Bearing Restrictions: No       Mobility Bed Mobility               General bed mobility comments: Pt sitting in recliner when OT arrived.   Transfers Overall transfer level: Needs assistance Equipment used: Rolling walker (2 wheeled) Transfers: Sit to/from Stand Sit to Stand: Supervision         General transfer comment: Pt able to sit<>stand with good balance but prefers to rely on RW for stability when ambulating in case of onset of dizziness        ADL Overall ADL's : Needs assistance/impaired                                       General ADL Comments: Pt continues to require Supervision for ADLs due to balance issues when standing. Pt reports that dizziness  seems to be slowly resolving and he has not vomited since yesterday. Pt was able to ambulate 423ft with RW with no c/o dizziness and returned to lying on his Right side in recliner following therapy. Reinforced looking at one stable object when he feels dizzy and pt verbalized understanding. Educated pt on fall prevention techniques at home including sitting for LB ADLs and shower.                 Cognition  Arousal/Alertness: Awake/Alert Behavior During Therapy: WFL for tasks assessed/performed Overall Cognitive Status: Within Functional Limits for tasks assessed                                    Pertinent Vitals/ Pain       Pain Assessment: No/denies pain         Frequency Min 2X/week     Progress Toward Goals  OT Goals(current goals can now be found in the care plan section)  Progress towards OT goals: Progressing toward goals     Plan Discharge plan remains appropriate       End of Session Equipment Utilized During Treatment: Gait belt;Rolling walker   Activity Tolerance Patient tolerated treatment well   Patient Left in chair;with call bell/phone within reach   Nurse Communication  Time: [id number] OT Time Calculation (min): 17 min  Charges: OT General Charges $OT Visit: 1 Procedure OT Treatments $Self Care/Home Management : 8-22 mins  [name], [name] [phone] 02/11/2014, 5:33 PM

## 2014-02-11 NOTE — Progress Notes (Signed)
Family Medicine Teaching Service Daily Progress Note Intern Pager: [phone]  Patient name: Randy [name] Medical record number: [id number] Date of birth: 1960/10/27 Age: 53 y.o. Gender: male  Primary Care Provider: Hazeline Junker, MD Consultants: Neurology Code Status: Full  Pt Overview and Major Events to Date:  8/12: Ataxia, right UE weakness since Sunday. MRI noting subacute infarcts  Assessment and Plan: [name] is a 53 y.o. male presenting with right-sided weakness. PMH is significant for poorly controlled HTN, hyperlipidemia, CAD s/p CABG, MI, carotid artery occlusion, PVD, AAA.   Subacute CVA: Well outside the tPA window with improving residual R-sided hemiplegia due to uncontrolled hypertension, vascular disease, and medical non-adherence. MRI/MRA showing R PICA infarct and subacute R parietal subcortical infarct in MCA distribution. Triglycerides 216, LDL 221, HDL 32. - Neurology consulted, appreciate recs, have now signed off - PT/OT/SLP: No further needs from OT and SLP, Home health PT, RW w/ 5" wheels,  - ASA 81mg , plavix, Lipitor 80mg   - Given nausea, vomiting, and increased lightheadedness and dizziness with sitting up, have placed scheduled meclizine.  - If meclizine doesn't work consider scopolamine. - Patient should not drive until cleared by neurology as outpatient (f/u with Dr[facility] Roda [facility]ters [facility])  HTN- BP poorly controlled off medications. Previously allowed permissive HTN. Will need further education about necessity of medications in the future to prevent further sequelae.  - Continue home lisinopril/HCTZ and amlodipine  - Care management consult for medication needs and HH services.   PAD with peripheral occlusions and carotid stenosis s/p R CEA 2012, L CEA attempted 2014 but disease too severe. CT Angio 12/2012 shows chronic occlusion of the left common iliac artery. Has current symptoms of Claudication and is followed by Dr Myra Gianotti [facility] -Carotid doppler prelim report showing Right = 60-79% ICA stenosis. Moderate homogenous plaque noted at bulb and ICA. Atypical vertebral waveform, suggesting a distal obstruction. Left = Significant subclavian stenosis and ECA stenosis. >50% ICA stenosis, based on stent criteria. Antegrade vertebral flow. -Needs continued outpt f/u with Dr. Myra Gianotti   AAA: Infrarenal abdominal aortic aneurysm has mildly enlarged, measuring 4.3 cm (01/13/13) and previously measuring 4.1 cm (12/2011) large amount of mural thrombus along the posterior aspect of the aneurysm. There is diffuse atherosclerotic disease throughout the abdominal aorta with irregular plaque. -Continue conservative management (watchful waiting)   GERD: Severe. Swallows baking soda for outpatient tx in addition to PPI.  - PPI, sucralfate, GI cocktail   FEN/GI: SLIV, heart healthy diet  Prophylaxis: Lovenox  Disposition: If able, dc home today with HH PT and RW with 5" wheels. Dizziness and nausea may be limiting factors. Needs outpatient neuro, PCP, and vascular surgery f/u.   Subjective:  Pt continues to have dizziness (spinning sensation) and nausea with movement. Feels mildly better than yesterday. Wants to go home but concerned about having to come back if he goes home and vomits. His daughter is able to help care for him at home. No chest pain, SOB, abdominal pain.  Objective: Temp:  [97.5 F (36.4 C)-98.3 F (36.8 C)] 97.5 F (36.4 C) (08/15 0611) Pulse Rate:  [57-68] 61 (08/15 0823) Resp:  [18-20] 18 (08/14 2255) BP: (116-147)/(73-87) 145/86 mmHg (08/15 0611) SpO2:  [95 %-100 %] 100 % (08/15 4081) Physical Exam: General: NAD, lying in bed on R side, wet towel on head Cardiovascular: Distant heart sounds. RRR, no m/r/g noted. Respiratory: CTAB. No wheezing, rhonchi, or crackles noted.  Abdomen: soft,  NT/ND Extremities: No rashes or deformities noted Neuro: grossly oriented, alert. Speech fluent.   Sensation over the face symmetric b/l. 5/5 strength in the upper and lower extremities on my exam (possibly some minor weakness on the R hand compared to the left but generally good strength throughout). Normal FNF today. EOMI. PERRL.  Laboratory:  Recent Labs Lab 02/08/14 1754  WBC 7.9  HGB 16.0  HCT 47.0  PLT 171    Recent Labs Lab 02/08/14 1754  NA 143  K 4.1  CL 102  CO2 28  BUN 14  CREATININE 1.21  CALCIUM 9.1  GLUCOSE 110*   Risk Stratification Labs  TSH    Component Value Date/Time   TSH 2.308 02/10/2012 1654   Hemoglobin A1C    Component Value Date/Time   HGBA1C 5.5 02/09/2014 0440   Lipid Panel     Component Value Date/Time   CHOL 296* 02/09/2014 0440   TRIG 216* 02/09/2014 0440   HDL 32* 02/09/2014 0440   CHOLHDL 9.3 02/09/2014 0440   VLDL 43* 02/09/2014 0440   LDLCALC 221* 02/09/2014 0440   RPR neg HIV neg  Imaging/Diagnostic Tests: MRI of the brain 02/08/2014 Findings consistent with RIGHT posterior inferior cerebellar artery territory infarct affecting three different locations within this vascular territory. Shower of emboli not excluded. The RIGHT vertebral is the dominant/sole contributor to the basilar. Subacute infarction of the RIGHT parietal subcortical white matter, MCA territory. Mild chronic microvascular ischemic change.   MRA of the brain 02/08/2014 1. 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 2. The much smaller left vertebral artery seen only above the left PICA. 3. Moderate to high-grade stenosis of the distal left vertebral artery. 4. Mild narrowing of the distal basilar artery. 5. Asymmetric attenuation of left PCA branch vessels. The left PCA originates from the basilar tip. The right PCA is of fetal type. 6. Atherosclerotic changes within the internal carotid arteries bilaterally without significant stenosis. 7. Mild distal small vessel disease within the anterior circulation.    CXR 10/22/2013 post Cabg. No acute abnormalities. Small hiatal hernia.   CTA of neck 8/13: The RIGHT vertebral is diffusely diseased without visible antegrade contribution to the posterior circulation. RIGHT PICA is poorly visualized. Distal RIGHT vertebral V4 segment appears to fill retrograde from the basilar. LEFT vertebral is reconstituted via muscular collaterals in the neck and primarily supplies the PICA. Rudimentary connection to the basilar. LEFT ICA stent is patent. Proximal RIGHT ICA web. BILATERAL calcific plaque posteriorly in the distal internal carotid arteries could represent old sequelae of dissection.  Echocardiogram 8/13:Mild concentric hypertrophy of the LV. Possible hypokinesis of the inferior myocardium. Grade I diastolic dysfunction.    Latrelle DodrillBrittany J Antonieta Slaven, MD 02/11/2014, 8:46 AM PGY-3, [place] [facility] FPTS Intern pager: [phone], text pages welcome

## 2014-02-11 NOTE — Progress Notes (Signed)
Pt states good relief from dizziness with initiation of scopolamine. No nausea or vomiting this shift. Pt's daughter lives with him. They both expressed strong desire for patient to return home. Daughter states she will assist with her Fathers care.the patient also states he has other friends and relatives to assist.

## 2014-02-12 MED ORDER — ONDANSETRON 8 MG PO TBDP: 8.0000 mg | ORAL_TABLET | Freq: Three times a day (TID) | ORAL | Status: DC | PRN

## 2014-02-12 MED ORDER — MECLIZINE HCL 25 MG PO TABS
25.0000 mg | ORAL_TABLET | Freq: Three times a day (TID) | ORAL | Status: DC | PRN
Start: 2014-02-12 — End: 2014-04-05

## 2014-02-12 MED ORDER — SCOPOLAMINE 1 MG/3DAYS TD PT72: 1.0000 | MEDICATED_PATCH | TRANSDERMAL | Status: DC

## 2014-02-12 NOTE — Discharge Instructions (Signed)
You were admitted to the hospital with a stroke, causing significant dizziness. Home health will be coming out to see you for physical therapy. Use the scopolamine patch and meclizine to help with dizziness. Use zofran as needed for nausea. Because your blood pressure was a little low today, stop taking amlodipine until you follow up with Dr. Jarvis Newcomer on Friday at the [facility] He will help figure out if you need to resume it.  DO NOT DRIVE until you have been cleared to drive by a doctor (either Dr. Jarvis Newcomer or the neurologist Dr. Roda Shutters)  What to do after you leave the hospital: Recommended diet: cardiac diet Recommended activity: activity as tolerated and no driving  Please seek medical attention if you experience any chest pain, shortness of breath, fevers, significant vomiting or other concerns.

## 2014-02-12 NOTE — Progress Notes (Signed)
Family Medicine Teaching Service Daily Progress Note Intern Pager: [phone]  Patient name: Randy [name] Medical record number: [id number] Date of birth: 01/22/1961 Age: 53 y.o. Gender: male  Primary Care Provider: Hazeline Junker, MD Consultants: Neurology Code Status: Full  Pt Overview and Major Events to Date:  8/12: Ataxia, right UE weakness since Sunday. MRI noting subacute infarcts  Assessment and Plan: Randy [name] is a 53 y.o. male presenting with right-sided weakness. PMH is significant for poorly controlled HTN, hyperlipidemia, CAD s/p CABG, MI, carotid artery occlusion, PVD, AAA.   Subacute CVA: Well outside the tPA window with improving residual R-sided hemiplegia due to uncontrolled hypertension, vascular disease, and medical non-adherence. MRI/MRA showing R PICA infarct and subacute R parietal subcortical infarct in MCA distribution. Triglycerides 216, LDL 221, HDL 32. - Neurology consulted, appreciate recs, have now signed off - PT/OT/SLP: No further needs from OT and SLP, Home health PT, RW w/ 5" wheels,  - ASA 81mg , plavix, Lipitor 80mg   - Given nausea, vomiting, and increased lightheadedness and dizziness with sitting up, have placed scheduled meclizine.  - started scopolamine patch 8/15 for refractory dizziness with some relief - Patient should not drive until cleared by neurology as outpatient (f/u with Dr. Roda [facility]ters [facility] in 2 mos) - now doing well and ready for d/c  HTN- BP poorly controlled off medications. Previously allowed permissive HTN. Will need further education about necessity of medications in the future to prevent further sequelae.  - Continue home lisinopril/HCTZ, hold amlodipine due to soft BP's overnight until f/u with PCP later this week - Care management consult for medication needs and [facility] services.  - hold amlodipine on d/c given mild hypotension (possible side effect of scopolamine)  PAD with peripheral occlusions and carotid stenosis s/p R CEA 2012,  L CEA attempted 2014 but disease too severe. CT Angio 12/2012 shows chronic occlusion of the left common iliac artery. Has current symptoms of Claudication and is followed by Dr Myra Gianotti with [facility] -Carotid doppler prelim report showing Right = 60-79% ICA stenosis. Moderate homogenous plaque noted at bulb and ICA. Atypical vertebral waveform, suggesting a distal obstruction. Left = Significant subclavian stenosis and ECA stenosis. >50% ICA stenosis, based on stent criteria. Antegrade vertebral flow. -Needs continued outpt f/u with Dr. Myra Gianotti   AAA: Infrarenal abdominal aortic aneurysm has mildly enlarged, measuring 4.3 cm (01/13/13) and previously measuring 4.1 cm (12/2011) large amount of mural thrombus along the posterior aspect of the aneurysm. There is diffuse atherosclerotic disease throughout the abdominal aorta with irregular plaque.  -Continue conservative management (watchful waiting)   GERD: Severe. Swallows baking soda for outpatient tx in addition to PPI.  - PPI, sucralfate, GI cocktail   FEN/GI: SLIV, heart healthy diet  Prophylaxis: Lovenox  Disposition:Dc home today with HH PT and RW with 5" wheels. Needs outpatient neuro, PCP, and vascular surgery f/u.   Subjective:  Feeling much better today, dizziness improved but does feel somewhat lightheaded. Nausea improved. Tolerating PO intake.  Objective: Temp:  [97.3 F (36.3 C)-98.4 F (36.9 C)] 97.3 F (36.3 C) (08/16 0159) Pulse Rate:  [61-65] 62 (08/16 0839) Resp:  [18-20] 20 (08/16 0159) BP: (99-121)/(65-67) 99/65 mmHg (08/16 0159) SpO2:  [96 %-99 %] 99 % (08/16 0159) Physical Exam: General: NAD, lying in bed Cardiovascular: RRR, no m/r/g noted. Respiratory: CTAB. No wheezing, rhonchi, or crackles noted.  Abdomen: soft, NT/ND Extremities: No rashes or deformities noted Neuro: grossly oriented, alert. Speech fluent.  Sensation over the  face symmetric b/l. 5/5 strength in the upper and  lower extremities. EOMI. PERRL.  Laboratory:  Recent Labs Lab 02/08/14 1754  WBC 7.9  HGB 16.0  HCT 47.0  PLT 171    Recent Labs Lab 02/08/14 1754  NA 143  K 4.1  CL 102  CO2 28  BUN 14  CREATININE 1.21  CALCIUM 9.1  GLUCOSE 110*   Risk Stratification Labs  TSH    Component Value Date/Time   TSH 2.308 02/10/2012 1654   Hemoglobin A1C    Component Value Date/Time   HGBA1C 5.5 02/09/2014 0440   Lipid Panel     Component Value Date/Time   CHOL 296* 02/09/2014 0440   TRIG 216* 02/09/2014 0440   HDL 32* 02/09/2014 0440   CHOLHDL 9.3 02/09/2014 0440   VLDL 43* 02/09/2014 0440   LDLCALC 221* 02/09/2014 0440   RPR neg HIV neg  Imaging/Diagnostic Tests: MRI of the brain 02/08/2014 Findings consistent with RIGHT posterior inferior cerebellar artery territory infarct affecting three different locations within this vascular territory. Shower of emboli not excluded. The RIGHT vertebral is the dominant/sole contributor to the basilar. Subacute infarction of the RIGHT parietal subcortical white matter, MCA territory. Mild chronic microvascular ischemic change.   MRA of the brain 02/08/2014 1. 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 2. The much smaller left vertebral artery seen only above the left PICA. 3. Moderate to high-grade stenosis of the distal left vertebral artery. 4. Mild narrowing of the distal basilar artery. 5. Asymmetric attenuation of left PCA branch vessels. The left PCA originates from the basilar tip. The right PCA is of fetal type. 6. Atherosclerotic changes within the internal carotid arteries bilaterally without significant stenosis. 7. Mild distal small vessel disease within the anterior circulation.   CXR 10/22/2013 post Cabg. No acute abnormalities. Small hiatal hernia.   CTA of neck 8/13: The RIGHT vertebral is diffusely diseased without visible antegrade contribution to the posterior  circulation. RIGHT PICA is poorly visualized. Distal RIGHT vertebral V4 segment appears to fill retrograde from the basilar. LEFT vertebral is reconstituted via muscular collaterals in the neck and primarily supplies the PICA. Rudimentary connection to the basilar. LEFT ICA stent is patent. Proximal RIGHT ICA web. BILATERAL calcific plaque posteriorly in the distal internal carotid arteries could represent old sequelae of dissection.  Echocardiogram 8/13:Mild concentric hypertrophy of the LV. Possible hypokinesis of the inferior myocardium. Grade I diastolic dysfunction.    Latrelle DodrillBrittany J Marbella Markgraf, MD 02/12/2014, 9:52 AM PGY-3, [place] [facility] FPTS Intern pager: [phone], text pages welcome

## 2014-02-13 ENCOUNTER — Telehealth: Payer: Self-pay | Admitting: Family Medicine

## 2014-02-13 NOTE — Telephone Encounter (Signed)
Called pt. No answer. Will discuss at hospital follow up appointment.

## 2014-02-13 NOTE — Progress Notes (Signed)
Late entry.  Seen and examined on 8/16.  Agree with the documentation and management by Dr. Pollie Meyer.  His blood pressure is well controled.  His vertigo is controled sufficiently to allow PO intake.  He knows he needs 24 hour supervision for safety.  OK to DC today with outpatient follow up.

## 2014-02-13 NOTE — Telephone Encounter (Signed)
Pt recently d/c from hospital after having a stroke. Randy [name] from [facility] calls to advise Dr. Jarvis Newcomer that pt cannot afford any of the meds that he was prescribed at d/c. Only meds that pt has is Asprin and Flexeril. Any questions pls contact Katherine at [phone].

## 2014-02-13 NOTE — Discharge Summary (Signed)
Seen and examined on the day of DC.  Agree with the documentation and management of Dr. McIntyre.   

## 2014-02-17 ENCOUNTER — Encounter: Payer: Self-pay | Admitting: Family Medicine

## 2014-02-17 ENCOUNTER — Ambulatory Visit (INDEPENDENT_AMBULATORY_CARE_PROVIDER_SITE_OTHER): Payer: 59 | Admitting: Family Medicine

## 2014-02-17 VITALS — BP 99/62 | HR 68 | Temp 98.1°F | Ht 70.0 in | Wt 177.0 lb

## 2014-02-17 DIAGNOSIS — F4321 Adjustment disorder with depressed mood: Secondary | ICD-10-CM

## 2014-02-17 DIAGNOSIS — I6322 Cerebral infarction due to unspecified occlusion or stenosis of basilar arteries: Secondary | ICD-10-CM

## 2014-02-17 DIAGNOSIS — I6302 Cerebral infarction due to thrombosis of basilar artery: Secondary | ICD-10-CM

## 2014-02-17 NOTE — Patient Instructions (Signed)
Your blood pressure looks great and physical therapy will keep helping you. You're going to keep getting better.  - Schedule a follow up appointment with me in 3 months - DO NOT DRIVE until you are cleared by a physician (either myself or your neurologist).  - I have signed a disability parking placard application for you for 6 months. I suspect you'll not need it after this.  - Call [phone] just to leave a message for me once you find out what that other medication is. We'll give you a call back about it.  - I will forward your care to our social worker who should be able to help your financial situation somehow.   Always good to see you! - Take care, - Dr. Jarvis Newcomer

## 2014-03-02 DIAGNOSIS — F4320 Adjustment disorder, unspecified: Secondary | ICD-10-CM | POA: Insufficient documentation

## 2014-03-02 NOTE — Progress Notes (Signed)
Patient ID: Randy [name], male   DOB: July 08, 1960, 53 y.o.   MRN: [id number]  Subjective:  Randy [name] is a 53 y.o. male with a history of severe CVD here for hospital follow up after right PICA CVA with right parietal subcortical white matter infarct.   Randy [name] was admitted after suffering a posterior circulation stroke with subsequent symptoms of vertigo and ataxia. He has continued rehabilitation for this which has improved some symptoms though he is still unable to use his right hand as he wants to. Despite having strength he has limited use of this, his dominant hand. He is upset with his newly diminished quality of life and with his insufficient disability income. He is depressed and reports fighting constantly with his daughter, to whom he is usually most close. He states "she doesn't understand how much this affects me."   Review of Systems:  Per HPI. All other systems reviewed and are negative. Objective:  BP 99/62  Pulse 68  Temp(Src) 98.1 F (36.7 C) (Oral)  Ht 5\' 10"  (1.778 m)  Wt 177 lb (80.287 kg)  BMI 25.40 kg/m2  Gen: Chronically ill-appearing 53 y.o. male in NAD HEENT: MMM, EOMI, PERRL, anicteric sclerae Neuro: Alert and oriented, speech fluent with intact cranial nerves. +Ataxia and mild dysmetria in right upper extremity with preserved strength and DTRs bilaterally.  Assessment:  Randy [name] is a 53 y.o. male here for hospital follow up of CVA with recalcitrant deficits and adjustment reaction.   Plan:  See problem list for problem-specific plans.  Right PICA ischemic CVA: Deficits improving but remaining. In the context of overall poor cardiovascular health and long history of medical nonadherence (due both to health-related attitudes and financial restrictions), prognosis is not likely to improve. - Current medication regimen is optimized, continue rehabilitation.  - Discussed at length natural association between CVA and depression. Will hold on starting  anti-depressant medication at this time, as this seems to be an adjustment reaction not only affecting him but his primary social support: his daughter. Referral for talk therapy declined.  - Referral to social worker for exploration of options regarding medication affordability and ongoing financial struggles.

## 2014-03-02 NOTE — Assessment & Plan Note (Signed)
Right PICA ischemic infarct with additional parietal white matter infarcts:  Deficits improving but remaining. In the context of overall poor cardiovascular health and long history of medical nonadherence (due both to health-related attitudes and financial restrictions), prognosis is not likely to improve. - Current medication regimen is optimized, continue rehabilitation.  - Discussed at length natural association between CVA and depression. Will hold on starting anti-depressant medication at this time, as this seems to be an adjustment reaction not only affecting him but his primary social support: his daughter. Referral for talk therapy declined.  - Referral to social worker for exploration of options regarding medication affordability and ongoing financial struggles.

## 2014-04-05 ENCOUNTER — Ambulatory Visit (INDEPENDENT_AMBULATORY_CARE_PROVIDER_SITE_OTHER): Payer: Medicaid Other | Admitting: Neurology

## 2014-04-05 ENCOUNTER — Encounter: Payer: Self-pay | Admitting: Neurology

## 2014-04-05 ENCOUNTER — Encounter (INDEPENDENT_AMBULATORY_CARE_PROVIDER_SITE_OTHER): Payer: Self-pay

## 2014-04-05 VITALS — BP 120/72 | HR 57 | Ht 70.0 in | Wt 187.6 lb

## 2014-04-05 DIAGNOSIS — I25810 Atherosclerosis of coronary artery bypass graft(s) without angina pectoris: Secondary | ICD-10-CM

## 2014-04-05 DIAGNOSIS — I739 Peripheral vascular disease, unspecified: Secondary | ICD-10-CM

## 2014-04-05 DIAGNOSIS — E785 Hyperlipidemia, unspecified: Secondary | ICD-10-CM | POA: Insufficient documentation

## 2014-04-05 DIAGNOSIS — I63019 Cerebral infarction due to thrombosis of unspecified vertebral artery: Secondary | ICD-10-CM

## 2014-04-05 MED ORDER — ATORVASTATIN CALCIUM 80 MG PO TABS: 80.0000 mg | ORAL_TABLET | Freq: Every day | ORAL | Status: DC

## 2014-04-05 MED ORDER — CLOPIDOGREL BISULFATE 75 MG PO TABS
75.0000 mg | ORAL_TABLET | Freq: Every day | ORAL | Status: DC
Start: 2014-04-05 — End: 2016-02-20

## 2014-04-05 NOTE — Patient Instructions (Addendum)
-   continue ASA and plavix for stroke prevention - continue lipitor 80mg  for stroke prevention. Stop lovastatin if you take lipitor. - discuss with Dr. Jarvis Newcomer for home BP monitoring device to monitor BP at home, goal systolic BP 120-140. Not too high or too low.  - continue to take the BP medication for now and monitor BP twice a day and discuss with Dr. Jarvis Newcomer to see if BP medication needs to be adjusted. - follow up with Dr. Jarvis Newcomer for stroke risk factor modification - follow up in 2 months.

## 2014-04-05 NOTE — Progress Notes (Signed)
STROKE NEUROLOGY FOLLOW UP NOTE  NAME: Randy [name] DOB: July 11, 1960  REASON FOR VISIT: stroke follow up HISTORY FROM: pt and chart  Today we had the pleasure of seeing Randy [name] in follow-up at our [facility]. Pt was accompanied by daughter.   History Summary Randy [name] is an 53 y.o. male with PMH significant for HTN, hyperlipidemia, CAD s/p CABG, MI, carotid artery stenosis s/p stent, PVD, AAA, was admitted on 02/08/14 due to vertigo, nausea, vomiting, unsteadiness, right arm weakness. 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 He was put on ASA and plavix. His LDL 221 and put on lipitor 80. He was discharged in stable condition.    He quit smoking last year after 30 years of smoking hx with 1-2 PPD.   Interval History During the interval time, the patient has been doing well. His vertigo, N/V are gone. He still has some off balance on walking, leaning towards right. He still has some incoordination with right hand.   He did not check BP at home as he did not have BP device. His  BP today 120/72. He has left leg numbness and pain on walking as he was told that his vessel in left leg was 100% occluded, not able to put stent in and also high risk for bypass surgery. He has carotid stenosis in the past and put on stent at left but not able to do anything for the right as it is too high in position.   Daughter is managing his meds and stated that he is not on plavix and only on ASA. He is not on lipitor 80 but on lovastatin 20mg . He saw his PCP Dr. Jarvis Newcomer in August after discharge.  REVIEW OF SYSTEMS: Full 14 system review of systems performed and notable only for those listed below and in HPI above, all others are negative:  Constitutional: fatigue, appetite change  Cardiovascular: chest pain  Ear/Nose/Throat:  runny nose  Skin: N/A  Eyes: blurry vision  Respiratory: choking, chest tightness  Gastroitestinal: N/A  Genitourinary: nausea Hematology/Lymphatic: N/A  Endocrine: N/A  Musculoskeletal: back pain, muscle cramps, walking difficulty  Allergy/Immunology: N/A  Neurological: dizziness  Psychiatric: insomnia, snoring, nervous, anxious  The following represents the patient's updated allergies and side effects list: No Known Allergies  Labs since last visit of relevance include the following: Results for orders placed during the hospital encounter of 02/08/14  BASIC METABOLIC PANEL      Result Value Ref Range   Sodium 143  137 - 147 mEq/L   Potassium 4.1  3.7 - 5.3 mEq/L   Chloride 102  96 - 112 mEq/L   CO2 28  19 - 32 mEq/L   Glucose, Bld 110 (*) 70 - 99 mg/dL   BUN 14  6 - 23 mg/dL   Creatinine, Ser 1.61  0.50 - 1.35 mg/dL   Calcium 9.1  8.4 - 09.6 mg/dL   GFR calc non Af Amer 66 (*) >90 mL/min   GFR calc Af Amer 76 (*) >90 mL/min   Anion gap 13  5 - 15  CBC WITH DIFFERENTIAL      Result Value Ref Range   WBC 7.9  4.0 - 10.5 K/uL   RBC 5.33  4.22 - 5.81 MIL/uL   Hemoglobin 16.0  13.0 - 17.0 g/dL   HCT 47.0  39.0 - 52.0 %   MCV 88.2  78.0 - 100.0 fL   MCH 30.0  26.0 - 34.0 pg   MCHC 34.0  30.0 - 36.0 g/dL   RDW 96.0  45.4 - 09.8 %   Platelets 171  150 - 400 K/uL   Neutrophils Relative % 65  43 - 77 %   Neutro Abs 5.1  1.7 - 7.7 K/uL   Lymphocytes Relative 25  12 - 46 %   Lymphs Abs 2.0  0.7 - 4.0 K/uL   Monocytes Relative 8  3 - 12 %   Monocytes Absolute 0.7  0.1 - 1.0 K/uL   Eosinophils Relative 2  0 - 5 %   Eosinophils Absolute 0.2  0.0 - 0.7 K/uL   Basophils Relative 0  0 - 1 %   Basophils Absolute 0.0  0.0 - 0.1 K/uL  TROPONIN I      Result Value Ref Range   Troponin I <0.30  <0.30 ng/mL  URINALYSIS, ROUTINE W REFLEX MICROSCOPIC      Result Value Ref Range   Color, Urine YELLOW  YELLOW   APPearance CLEAR  CLEAR   Specific Gravity, Urine 1.023  1.005 - 1.030    pH 6.0  5.0 - 8.0   Glucose, UA NEGATIVE  NEGATIVE mg/dL   Hgb urine dipstick NEGATIVE  NEGATIVE   Bilirubin Urine NEGATIVE  NEGATIVE   Ketones, ur NEGATIVE  NEGATIVE mg/dL   Protein, ur NEGATIVE  NEGATIVE mg/dL   Urobilinogen, UA 1.0  0.0 - 1.0 mg/dL   Nitrite NEGATIVE  NEGATIVE   Leukocytes, UA NEGATIVE  NEGATIVE  HEMOGLOBIN A1C      Result Value Ref Range   Hemoglobin A1C 5.5  <5.7 %   Mean Plasma Glucose 111  <117 mg/dL  LIPID PANEL      Result Value Ref Range   Cholesterol 296 (*) 0 - 200 mg/dL   Triglycerides 119 (*) <150 mg/dL   HDL 32 (*) >14 mg/dL   Total CHOL/HDL Ratio 9.3     VLDL 43 (*) 0 - 40 mg/dL   LDL Cholesterol 782 (*) 0 - 99 mg/dL  HEMOGLOBIN N5A      Result Value Ref Range   Hemoglobin A1C 5.5  <5.7 %   Mean Plasma Glucose 111  <117 mg/dL    The neurologically relevant items on the patient's problem list were reviewed on today's visit.  Neurologic Examination  A problem focused neurological exam (12 or more points of the single system neurologic examination, vital signs counts as 1 point, cranial nerves count for 8 points) was performed.  Blood pressure 120/72, pulse 57, height 5\' 10"  (1.778 m), weight 187 lb 9.6 oz (85.095 kg).  General - Well nourished, well developed, in no apparent distress.  Ophthalmologic - not able to see through.  Cardiovascular - Regular rate and rhythm with no murmur.  Mental Status -  Level of arousal and orientation to time, place, and person were intact. Language including expression, naming, repetition, comprehension was assessed and found intact.  Cranial Nerves II - XII - II - Visual field intact OU. III, IV, VI - Extraocular movements intact. V - Facial sensation intact bilaterally. VII - Facial movement intact bilaterally. VIII - Hearing & vestibular intact bilaterally. X - Palate elevates symmetrically. XI - Chin turning & shoulder shrug intact bilaterally. XII - Tongue protrusion intact.  Motor Strength -  The patient's strength was normal in all extremities except LLE 4+/5 due to  pain and pronator drift was absent.  Bulk was normal and fasciculations were absent.   Motor Tone - Muscle tone was assessed at the neck and appendages and was normal.  Reflexes - The patient's reflexes were 1+ in all extremities and he had no pathological reflexes.  Sensory - Light touch, temperature/pinprick were assessed and were normal.    Coordination - The patient had normal movements in the hands and feet with no ataxia or dysmetria.  Tremor was absent.  Gait and Station - mild left hemiparetic gait due to pain at LLE.  Data reviewed: I personally reviewed the images and agree with the radiology interpretations.  MRI of the brain 02/08/2014 Findings consistent with RIGHT posterior inferior cerebellar artery territory infarct affecting three different locations within this vascular territory. Shower of emboli not excluded. The RIGHT vertebral is the dominant/sole contributor to the basilar. Subacute infarction of the RIGHT parietal subcortical white matter, MCA territory. Mild chronic microvascular ischemic change. A call is into the ordering provider.  MRA of the brain 02/08/2014 1. 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 2. The much smaller left vertebral artery seen only above the left PICA. 3. Moderate to high-grade stenosis of the distal left vertebral artery. 4. Mild narrowing of the distal basilar artery. 5. Asymmetric attenuation of left PCA branch vessels. The left PCA originates from the basilar tip. The right PCA is of fetal type. 6. Atherosclerotic changes within the internal carotid arteries bilaterally without significant stenosis. 7. Mild distal small vessel disease within the anterior circulation.  CT angiogram neck The RIGHT vertebral is diffusely diseased without visible antegrade contribution to the posterior circulation. RIGHT PICA is  poorly visualized. Distal RIGHT vertebral V4 segment appears to fill retrograde from the basilar. LEFT vertebral is reconstituted via muscular collaterals in the neck and primarily supplies the PICA. Rudimentary connection to the basilar. LEFT ICA stent is patent. Proximal RIGHT ICA web. BILATERAL calcific plaque posteriorly in the distal internal carotid arteries could represent old sequelae of dissection.  2D Echocardiogram The cavity size was normal. There was mild concentric hypertrophy. Systolic function was normal. Possible hypokinesis of the inferior myocardium. Doppler parameters are consistent with abnormal left ventricular relaxation (grade 1 diastolic dysfunction). CXR 10/22/2013 post Cabg. No acute abnormalities. Small hiatal hernia.  EKG Sinus tachycardia with irregular rate. Probable left atrial enlargement. Borderline left axis deviation. Consider anterior infarct. Abnormal T, consider ischemia, lateral leads Artifact in lead(s) I II aVR.  Component     Latest Ref Rng 02/08/2014 02/09/2014  Cholesterol     0 - 200 mg/dL  409 (H)  Triglycerides     <150 mg/dL  811 (H)  HDL     >91 mg/dL  32 (L)  Total CHOL/HDL Ratio       9.3  VLDL     0 - 40 mg/dL  43 (H)  LDL (calc)     0 - 99 mg/dL  478 (H)  Hemoglobin G9F     <5.7 % 5.5 5.5  Mean Plasma Glucose     <117 mg/dL 621 308    Assessment: As you may recall, he is a 53 y.o. 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 Also found to have subacute right parietal subcortical stroke. 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 He needs dual antiplatelet for 3 months from stroke standpoint, can be longer if from cardiac prospective. He has high  LDL at 221 and will switch him back to lipitor high dose.   Due to occlusion of right VA and hypoplastic left VA, I would recommend the have SBP at 120-140 to avoid hypoperfusion at posterior circulation. Will ask him to trend BP at home and adjust BP meds as needed.  Plan:  - dual antiplatelet with ASA and plavix for stroke prevnetion - switch to lipitor high dose 80mg  for stroke prevention and HLD - BP monitor at home with goal 120-140 - follow up with PCP for stroke risk factor modification - follow up with vascular surgery for LLE pain due to vessel occlusion. - RTC in 2 months.  No orders of the defined types were placed in this encounter.    Meds ordered this encounter  Medications  . atorvastatin (LIPITOR) 80 MG tablet    Sig: Take 1 tablet (80 mg total) by mouth daily.    Dispense:  90 tablet    Refill:  3  . clopidogrel (PLAVIX) 75 MG tablet    Sig: Take 1 tablet (75 mg total) by mouth daily with breakfast.    Dispense:  30 tablet    Refill:  3    Patient Instructions  - continue ASA and plavix for stroke prevention - continue lipitor 80mg  for stroke prevention. Stop lovastatin if you take lipitor. - discuss with Dr. Jarvis NewcomerGrunz for home BP monitoring device to monitor BP at home, goal systolic BP 120-140. Not too high or too low.  - continue to take the BP medication for now and monitor BP twice a day and discuss with Dr. Jarvis NewcomerGrunz to see if BP medication needs to be adjusted. - follow up with Dr. Jarvis NewcomerGrunz for stroke risk factor modification - follow up in 2 months.   Marvel PlanJindong Jahel Wavra, MD PhD [facility] [address], [address] [place], [place] [address] [phone]

## 2014-05-30 ENCOUNTER — Other Ambulatory Visit: Payer: Self-pay | Admitting: Family Medicine

## 2014-06-08 ENCOUNTER — Encounter (HOSPITAL_COMMUNITY): Payer: Self-pay | Admitting: Surgery

## 2014-06-15 ENCOUNTER — Ambulatory Visit (INDEPENDENT_AMBULATORY_CARE_PROVIDER_SITE_OTHER): Payer: Medicaid Other | Admitting: Neurology

## 2014-06-15 ENCOUNTER — Encounter: Payer: Self-pay | Admitting: Neurology

## 2014-06-15 VITALS — BP 103/66 | HR 58 | Ht 70.0 in | Wt 186.8 lb

## 2014-06-15 DIAGNOSIS — I63019 Cerebral infarction due to thrombosis of unspecified vertebral artery: Secondary | ICD-10-CM

## 2014-06-15 DIAGNOSIS — I10 Essential (primary) hypertension: Secondary | ICD-10-CM | POA: Insufficient documentation

## 2014-06-15 DIAGNOSIS — E785 Hyperlipidemia, unspecified: Secondary | ICD-10-CM

## 2014-06-15 HISTORY — DX: Essential (primary) hypertension: I10

## 2014-06-15 MED ORDER — LISINOPRIL 20 MG PO TABS: 20.0000 mg | ORAL_TABLET | Freq: Every day | ORAL | Status: DC

## 2014-06-15 NOTE — Patient Instructions (Signed)
-   continue ASA and lipitor for stroke prevention - due to your vessel occlusion at the back of skull, your BP goal is 120-140 to maintain adequate blood flow.  - you are on 3 BP medications and your BP today is low - will stop lisinopril-HCTZ combination and put you on lisinopril only - check BP at home twice a day and record and bring over to Dr. Jarvis Newcomer at next visit - make appointment with PCP in 2 weeks - may request prescription of BP monitor devices from PCP and buy from pharmacy under insurance coverage. If not able to cover, we recommend you buy your own - Follow up with your primary care physician for stroke risk factor modification. Recommend maintain blood pressure goal <130/80, diabetes with hemoglobin A1c goal below 6.5% and lipids with LDL cholesterol goal below 70 mg/dL.  - follow up in 6 months.

## 2014-06-16 DIAGNOSIS — I63019 Cerebral infarction due to thrombosis of unspecified vertebral artery: Secondary | ICD-10-CM | POA: Insufficient documentation

## 2014-06-16 HISTORY — DX: Cerebral infarction due to thrombosis of unspecified vertebral artery: I63.019

## 2014-06-16 NOTE — Progress Notes (Signed)
STROKE NEUROLOGY FOLLOW UP NOTE  NAME: Randy [name] DOB: 1961-02-03  REASON FOR VISIT: stroke follow up HISTORY FROM: pt and chart  Today we had the pleasure of seeing Randy [name] in follow-up at our [facility]. Pt was accompanied by daughter.   History Summary Randy [name] is an 53 y.o. male with PMH significant for HTN, hyperlipidemia, CAD s/p CABG, MI, carotid artery stenosis s/p stent, PVD, AAA, was admitted on 02/08/14 due to vertigo, nausea, vomiting, unsteadiness, right arm weakness. 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 He was put on ASA and plavix. His LDL 221 and put on lipitor 80. He was discharged in stable condition.   He quit smoking last year after 30 years of smoking hx with 1-2 PPD.   Follow up 04/05/14 - the patient has been doing well. His vertigo, N/V are gone. He still has some off balance on walking, leaning towards right. He still has some incoordination with right hand.  He did not check BP at home as he did not have BP device. His  BP today 120/72. He has left leg numbness and pain on walking as he was told that his vessel in left leg was 100% occluded, not able to put stent in and also high risk for bypass surgery. He has carotid stenosis in the past and put on stent at left but not able to do anything for the right as it is too high in position.  Daughter is managing his meds and stated that he is not on plavix and only on ASA. He is not on lipitor 80 but on lovastatin 20mg . He saw his PCP Dr. Jarvis Newcomer in August after discharge.  Interval History During the interval time, he has been doing well. On dural antiplatelet, but did not check BP at home as he did not have BP device. Today in clinic 103/66. Otherwise, he has no complains.   REVIEW OF SYSTEMS: Full 14 system review of systems  performed and notable only for those listed below and in HPI above, all others are negative:  Constitutional: N/A  Cardiovascular: chest pain  Ear/Nose/Throat: runny nose, trouble swallowing, drooling  Skin: N/A  Eyes: blurry vision  Respiratory: choking, chest tightness  Gastroitestinal: N/A  Genitourinary: N/A Hematology/Lymphatic: N/A  Endocrine: N/A  Musculoskeletal: back pain, walking difficulty  Allergy/Immunology: N/A  Neurological: N/A  Psychiatric: snoring  The following represents the patient's updated allergies and side effects list: No Known Allergies  Labs since last visit of relevance include the following: Results for orders placed or performed during the hospital encounter of 02/08/14  Basic metabolic panel  Result Value Ref Range   Sodium 143 137 - 147 mEq/L   Potassium 4.1 3.7 - 5.3 mEq/L   Chloride 102 96 - 112 mEq/L   CO2 28 19 - 32 mEq/L   Glucose, Bld 110 (H) 70 - 99 mg/dL   BUN 14 6 - 23 mg/dL   Creatinine, Ser 8.08 0.50 - 1.35 mg/dL   Calcium 9.1 8.4 - 81.1 mg/dL   GFR calc non Af Amer 66 (L) >90 mL/min   GFR calc Af Amer 76 (L) >90 mL/min   Anion gap 13 5 - 15  CBC with Differential  Result Value Ref Range   WBC 7.9 4.0 - 10.5 K/uL   RBC 5.33 4.22 - 5.81  MIL/uL   Hemoglobin 16.0 13.0 - 17.0 g/dL   HCT 16.147.0 09.639.0 - 04.552.0 %   MCV 88.2 78.0 - 100.0 fL   MCH 30.0 26.0 - 34.0 pg   MCHC 34.0 30.0 - 36.0 g/dL   RDW 40.914.0 81.111.5 - 91.415.5 %   Platelets 171 150 - 400 K/uL   Neutrophils Relative % 65 43 - 77 %   Neutro Abs 5.1 1.7 - 7.7 K/uL   Lymphocytes Relative 25 12 - 46 %   Lymphs Abs 2.0 0.7 - 4.0 K/uL   Monocytes Relative 8 3 - 12 %   Monocytes Absolute 0.7 0.1 - 1.0 K/uL   Eosinophils Relative 2 0 - 5 %   Eosinophils Absolute 0.2 0.0 - 0.7 K/uL   Basophils Relative 0 0 - 1 %   Basophils Absolute 0.0 0.0 - 0.1 K/uL  Troponin I  Result Value Ref Range   Troponin I <0.30 <0.30 ng/mL  Urinalysis, Routine w reflex microscopic  Result Value Ref  Range   Color, Urine YELLOW YELLOW   APPearance CLEAR CLEAR   Specific Gravity, Urine 1.023 1.005 - 1.030   pH 6.0 5.0 - 8.0   Glucose, UA NEGATIVE NEGATIVE mg/dL   Hgb urine dipstick NEGATIVE NEGATIVE   Bilirubin Urine NEGATIVE NEGATIVE   Ketones, ur NEGATIVE NEGATIVE mg/dL   Protein, ur NEGATIVE NEGATIVE mg/dL   Urobilinogen, UA 1.0 0.0 - 1.0 mg/dL   Nitrite NEGATIVE NEGATIVE   Leukocytes, UA NEGATIVE NEGATIVE  Hemoglobin A1c  Result Value Ref Range   Hgb A1c MFr Bld 5.5 <5.7 %   Mean Plasma Glucose 111 <117 mg/dL  Lipid panel  Result Value Ref Range   Cholesterol 296 (H) 0 - 200 mg/dL   Triglycerides 782216 (H) <150 mg/dL   HDL 32 (L) >95>39 mg/dL   Total CHOL/HDL Ratio 9.3 RATIO   VLDL 43 (H) 0 - 40 mg/dL   LDL Cholesterol 621221 (H) 0 - 99 mg/dL  Hemoglobin H0QA1c  Result Value Ref Range   Hgb A1c MFr Bld 5.5 <5.7 %   Mean Plasma Glucose 111 <117 mg/dL    The neurologically relevant items on the patient's problem list were reviewed on today's visit.  Neurologic Examination  A problem focused neurological exam (12 or more points of the single system neurologic examination, vital signs counts as 1 point, cranial nerves count for 8 points) was performed.  Blood pressure 103/66, pulse 58, height 5\' 10"  (1.778 m), weight 186 lb 12.8 oz (84.732 kg).  General - Well nourished, well developed, in no apparent distress.  Ophthalmologic - not able to see through.  Cardiovascular - Regular rate and rhythm with no murmur.  Mental Status -  Level of arousal and orientation to time, place, and person were intact. Language including expression, naming, repetition, comprehension was assessed and found intact.  Cranial Nerves II - XII - II - Visual field intact OU. III, IV, VI - Extraocular movements intact. V - Facial sensation intact bilaterally. VII - Facial movement intact bilaterally. VIII - Hearing & vestibular intact bilaterally. X - Palate elevates symmetrically. XI - Chin  turning & shoulder shrug intact bilaterally. XII - Tongue protrusion intact.  Motor Strength - The patient's strength was normal in all extremities except LLE 4+/5 due to pain and pronator drift was absent.  Bulk was normal and fasciculations were absent.   Motor Tone - Muscle tone was assessed at the neck and appendages and was normal.  Reflexes - The patient's  reflexes were 1+ in all extremities and he had no pathological reflexes.  Sensory - Light touch, temperature/pinprick were assessed and were normal.    Coordination - The patient had normal movements in the hands and feet with no ataxia or dysmetria.  Tremor was absent.  Gait and Station - mild left hemiparetic gait due to pain at LLE.  Data reviewed: I personally reviewed the images and agree with the radiology interpretations.  MRI of the brain 02/08/2014 Findings consistent with RIGHT posterior inferior cerebellar artery territory infarct affecting three different locations within this vascular territory. Shower of emboli not excluded. The RIGHT vertebral is the dominant/sole contributor to the basilar. Subacute infarction of the RIGHT parietal subcortical white matter, MCA territory. Mild chronic microvascular ischemic change. A call is into the ordering provider.  MRA of the brain 02/08/2014 1. 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 2. The much smaller left vertebral artery seen only above the left PICA. 3. Moderate to high-grade stenosis of the distal left vertebral artery. 4. Mild narrowing of the distal basilar artery. 5. Asymmetric attenuation of left PCA branch vessels. The left PCA originates from the basilar tip. The right PCA is of fetal type. 6. Atherosclerotic changes within the internal carotid arteries bilaterally without significant stenosis. 7. Mild distal small vessel disease within the anterior circulation.  CT angiogram neck The RIGHT vertebral is  diffusely diseased without visible antegrade contribution to the posterior circulation. RIGHT PICA is poorly visualized. Distal RIGHT vertebral V4 segment appears to fill retrograde from the basilar. LEFT vertebral is reconstituted via muscular collaterals in the neck and primarily supplies the PICA. Rudimentary connection to the basilar. LEFT ICA stent is patent. Proximal RIGHT ICA web. BILATERAL calcific plaque posteriorly in the distal internal carotid arteries could represent old sequelae of dissection.  2D Echocardiogram The cavity size was normal. There was mild concentric hypertrophy. Systolic function was normal. Possible hypokinesis of the inferior myocardium. Doppler parameters are consistent with abnormal left ventricular relaxation (grade 1 diastolic dysfunction). CXR 10/22/2013 post Cabg. No acute abnormalities. Small hiatal hernia.  EKG Sinus tachycardia with irregular rate. Probable left atrial enlargement. Borderline left axis deviation. Consider anterior infarct. Abnormal T, consider ischemia, lateral leads Artifact in lead(s) I II aVR.  Component     Latest Ref Rng 02/08/2014 02/09/2014  Cholesterol     0 - 200 mg/dL  161 (H)  Triglycerides     <150 mg/dL  096 (H)  HDL     >04 mg/dL  32 (L)  Total CHOL/HDL Ratio       9.3  VLDL     0 - 40 mg/dL  43 (H)  LDL (calc)     0 - 99 mg/dL  540 (H)  Hemoglobin J8J     <5.7 % 5.5 5.5  Mean Plasma Glucose     <117 mg/dL 191 478    Assessment: As you may recall, he is a 53 y.o. 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 Also found to have subacute right parietal subcortical stroke. 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 He needs dual  antiplatelet for stroke and cardiac prevention. He has high LDL and on lipitor  high dose.   Due to occlusion of right VA and hypoplastic left VA, I would recommend the have SBP at 120-140 to avoid hypoperfusion at posterior circulation.   Plan:  - dual antiplatelet with ASA and plavix for stroke and cardiac prevnetion - high dose lipitor 80mg  for stroke prevention and HLD - BP monitor at home with goal 120-140 - today BP low, wil d/c HCTZ. Continue lisinopril and coreg. Check BP at home and record and bring over to PCP for medication adjustment. - Follow up with your primary care physician for stroke risk factor modification. Recommend maintain blood pressure goal <130/80, diabetes with hemoglobin A1c goal below 6.5% and lipids with LDL cholesterol goal below 70 mg/dL.  - RTC in 6 months.  No orders of the defined types were placed in this encounter.    Meds ordered this encounter  Medications  . lisinopril (PRINIVIL,ZESTRIL) 20 MG tablet    Sig: Take 1 tablet (20 mg total) by mouth daily.    Dispense:  90 tablet    Refill:  3    Patient Instructions  - continue ASA and lipitor for stroke prevention - due to your vessel occlusion at the back of skull, your BP goal is 120-140 to maintain adequate blood flow.  - you are on 3 BP medications and your BP today is low - will stop lisinopril-HCTZ combination and put you on lisinopril only - check BP at home twice a day and record and bring over to Dr. Jarvis Newcomer at next visit - make appointment with PCP in 2 weeks - may request prescription of BP monitor devices from PCP and buy from pharmacy under insurance coverage. If not able to cover, we recommend you buy your own - Follow up with your primary care physician for stroke risk factor modification. Recommend maintain blood pressure goal <130/80, diabetes with hemoglobin A1c goal below 6.5% and lipids with LDL cholesterol goal below 70 mg/dL.  - follow up in 6 months.    Marvel Plan, MD  PhD [facility] [address], [address] [place], [place] [address] [phone]

## 2014-07-07 ENCOUNTER — Ambulatory Visit: Payer: Medicaid Other | Admitting: Family Medicine

## 2014-07-19 ENCOUNTER — Ambulatory Visit (INDEPENDENT_AMBULATORY_CARE_PROVIDER_SITE_OTHER): Payer: Medicaid Other | Admitting: Family Medicine

## 2014-07-19 ENCOUNTER — Encounter: Payer: Self-pay | Admitting: Family Medicine

## 2014-07-19 VITALS — BP 152/85 | HR 72 | Temp 98.0°F | Ht 70.0 in | Wt 190.8 lb

## 2014-07-19 DIAGNOSIS — I10 Essential (primary) hypertension: Secondary | ICD-10-CM

## 2014-07-19 DIAGNOSIS — G4700 Insomnia, unspecified: Secondary | ICD-10-CM

## 2014-07-19 DIAGNOSIS — Z23 Encounter for immunization: Secondary | ICD-10-CM

## 2014-07-19 MED ORDER — BLOOD PRESSURE MONITOR/M CUFF MISC
1.0000 | Freq: Once | Status: DC
Start: 2014-07-19 — End: 2016-04-13

## 2014-07-19 MED ORDER — TRAZODONE HCL 150 MG PO TABS: 150.0000 mg | ORAL_TABLET | Freq: Every day | ORAL | Status: DC

## 2014-07-19 NOTE — Patient Instructions (Signed)
Please schedule an appointment with Dr. Raymondo Band for 24 hours BP monitoring. Keep taking the medications you are taking and we will let you know what we need to do to your medications.

## 2014-07-25 NOTE — Progress Notes (Signed)
Patient ID: Randy [name], male   DOB: 07/03/1960, 54 y.o.   MRN: [id number]   Subjective:  Randy [name] is a 54 y.o. male with a history of extensive PAD, poorly controlled HTN, medical nonadherence and recent CVA here for hypertension follow up.   Does not check BP at home. Misses 0 doses out of past 7 days because his grand daughter administers medications now. is not exercising and is not adherent to a low-salt diet. He is without new complaints. Still has CVA-related deficits including gait instability falling to left and unilateral weakness.   Review of Systems:  Specifically denies CP, SOB, claudication, palpitations, vision changes, syncope, orthopnea, PND. Further denies dizziness, leg swelling, urinary frequency.     Past Medical History: Patient Active Problem List   Diagnosis Date Noted  . 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 HTN (hypertension) 06/15/2014  . HLD (hyperlipidemia) 04/05/2014  . Adjustment reaction 03/02/2014  . Dysmetria 02/08/2014  . CVA (cerebral infarction) 02/08/2014  . Muscle strain of lower extremity 11/25/2013  . Chest pain 10/23/2013  . PVD (peripheral vascular disease) with claudication 05/09/2013  . Alcohol consumption heavy 03/28/2013  . GERD (gastroesophageal reflux disease) 03/28/2013  . Pain in limb 01/03/2013  . Lumbago 10/28/2012  . AAA (abdominal aortic aneurysm) without rupture 10/28/2012  . Peripheral vascular disease 02/12/2012  . Anxiety 02/12/2012  . Uncontrolled hypertension 02/02/2012  . Tobacco abuse 02/02/2012  . Financial difficulties 02/02/2012  . Occlusion and stenosis of carotid artery without mention of cerebral infarction 01/19/2012  . Pulmonary nodule 05/19/2011  . HYPERLIPIDEMIA-MIXED 10/19/2008  . OTH MIXED/UNSPEC NONDEPENDENT DRUG ABUSE UNSPEC 10/19/2008  . CAD, ARTERY BYPASS GRAFT 10/19/2008  . DISC DISEASE, LUMBAR 10/19/2008  . CHEST PAIN-UNSPECIFIED 10/19/2008    Medications:  reviewed and updated Current Outpatient Prescriptions  Medication Sig Dispense Refill  . aspirin EC 81 MG tablet Take 81 mg by mouth daily.    [name] atorvastatin (LIPITOR) 80 MG tablet Take 1 tablet (80 mg total) by mouth daily. 90 tablet 3  . Blood Pressure Monitoring (BLOOD PRESSURE MONITOR/M CUFF) MISC 1 each by Does not apply route once. 1 each 0  . carvedilol (COREG) 12.5 MG tablet Take 1 tablet (12.5 mg total) by mouth 2 (two) times daily with a meal. 90 tablet 3  . clopidogrel (PLAVIX) 75 MG tablet Take 1 tablet (75 mg total) by mouth daily with breakfast. 30 tablet 3  . cyclobenzaprine (FLEXERIL) 10 MG tablet Take 1 tablet (10 mg total) by mouth 3 (three) times daily as needed for muscle spasms. 30 tablet 0  . lisinopril (PRINIVIL,ZESTRIL) 20 MG tablet Take 1 tablet (20 mg total) by mouth daily. 90 tablet 3  . ranitidine (ZANTAC) 300 MG tablet TAKE ONE TABLET BY MOUTH TWICE DAILY 60 tablet 0  . traZODone (DESYREL) 150 MG tablet Take 1 tablet (150 mg total) by mouth at bedtime. 30 tablet 1   No current facility-administered medications for this visit.   Objective:  BP 152/85 mmHg  Pulse 72  Temp(Src) 98 F (36.7 C) (Oral)  Ht 5\' 10"  (1.778 m)  Wt 190 lb 12.8 oz (86.546 kg)  BMI 27.38 kg/m2 Gen: Tired and chronically-appearing 53 y.o.male in NAD HEENT: MMM, posterior oropharynx clear, poor dentition Neck: Bilateral bruits; thyroid not enlarged  Pulm: Non-labored; Distant but clear breaths CV: Regular rate, no murmur, rub or gallop; no LE edema, no JVD; distal pulses grossly diminished GI: Normoactive BS; soft, NT, ND, no HSM  MSK: No gross deformities, normal muscle tone and bulk. Homan's sign negative.  Neuro: Alert and oriented x4, speech and gait are normal, strength symmetric 5/5 throughout, sensation intact to light touch throughout   CREATININE 1.21 02/08/2014 1754   CREATININE 1.00 02/10/2012 1654     Assessment:  Randy [name] is a 54 y.o. male here for hypertension.      Plan:  See problem list for problem-specific plans.

## 2014-07-25 NOTE — Assessment & Plan Note (Signed)
Benign essential HTN: Stage I, uncontrolled but improved historically. Multifactorial nonadherence contributing.  - Continue current anti-hypertensive regimen as he has bottomed out when fully compliant in the past and prefer to maintain CPP.  - Refer for 24hr BP monitoring - Rx BP monitor for home, will bring daily readings.  - Check renal function - DASH diet and weight loss strategies reviewed in detail  - Follow up in 2 weeks week for BP check

## 2014-09-08 ENCOUNTER — Ambulatory Visit (INDEPENDENT_AMBULATORY_CARE_PROVIDER_SITE_OTHER): Payer: Medicaid Other | Admitting: Family Medicine

## 2014-09-08 ENCOUNTER — Encounter: Payer: Self-pay | Admitting: Family Medicine

## 2014-09-08 VITALS — BP 149/107 | HR 96 | Temp 98.1°F | Ht 70.0 in | Wt 186.7 lb

## 2014-09-08 DIAGNOSIS — M545 Low back pain, unspecified: Secondary | ICD-10-CM

## 2014-09-08 MED ORDER — METHOCARBAMOL 750 MG PO TABS: 1500.0000 mg | ORAL_TABLET | Freq: Four times a day (QID) | ORAL | Status: DC | PRN

## 2014-09-08 MED ORDER — TRAMADOL HCL 50 MG PO TABS: 50.0000 mg | ORAL_TABLET | Freq: Three times a day (TID) | ORAL | Status: DC | PRN

## 2014-09-08 NOTE — Assessment & Plan Note (Addendum)
Likely MSK in nature. No red flags on exam. Will treat with Robaxin (flexeril stopped) and PRN Tramadol (# 30 given).

## 2014-09-08 NOTE — Progress Notes (Signed)
   Subjective:    Patient ID: Randy [name], male    DOB: 10/17/1960, 54 y.o.   MRN: [id number]  HPI 54 year old male with a complicated PMH (primarily vascular disease) and history of back pain presents for a same day appointment for evaluation of back pain.  1) Low back pain  Patient reports worsening low back pain (left sided) as of Monday.  No trauma or fall.  He does note that he "twisted the wrong way"  Pain is severe.  No new numbness/tingling in LE's (he notes some chronic numbness of his left thigh due to vascular disease).  No associated weakness.  No saddle anesthesia, urinary or fecal incontinence.  No exacerbating factors.  He has been taking frequent flexeril with improvement (however, he is now out).  Review of Systems Per HPI    Objective:   Physical Exam Filed Vitals:   09/08/14 1435  BP: 149/107  Pulse: 96  Temp: 98.1 F (36.7 C)   Exam: General: chronically ill appearing male in NAD.  Cardiovascular: RRR. No murmurs, rubs, or gallops. Respiratory: CTAB. No rales, rhonchi, or wheeze.  Back Exam:  Inspection: Unremarkable  Motion: limited in all planes due to pain.  Rotation to 45 deg bilaterally  XSLR laying: Equivocal.  Palpable tenderness: yes, left lower lumbar paraspinal musculature. Sensory change: Gross sensation intact  Strength at foot  Plantar-flexion: 5/5 Dorsi-flexion: 5/5 Leg strength  Quad: 5/5 Hamstring: 5/5    Assessment & Plan:  See Problem List

## 2014-09-08 NOTE — Patient Instructions (Signed)
It was nice to see today.  You can stop the Flexeril. I prescribed Robaxin for you. It is also muscle relaxant.  I have also prescribed for pain. Use as needed.  Follow closely with Dr. Jarvis Newcomer.   Take care  Dr. Adriana Simas

## 2014-09-18 NOTE — Progress Notes (Signed)
I was the preceptor for this encounter. Ginnie Marich, M.D. 

## 2014-10-16 ENCOUNTER — Telehealth: Payer: Self-pay

## 2014-10-16 NOTE — Telephone Encounter (Signed)
I called the patient to reschedule an appointment. He stated that he has had a headache for 3 days and the left side of his face is beginning to feel numb. He is also getting progressively weaker on the left side of his body and has dizziness and nausea. I spoke to Dr. Roda Shutters about the patient's symptoms and Dr. Roda Shutters recommended the patient go to the hospital to get a full work up. I called the patient back to relay this information. The patient agreed with the plan and stated he was going to hang up and call his daughter. I will reschedule the patient's appointment in June at a later time.

## 2014-10-26 ENCOUNTER — Telehealth: Payer: Self-pay

## 2014-10-26 NOTE — Telephone Encounter (Signed)
Left voicemail asking patient to call back to reschedule appointment in June with Dr. Roda Shutters due to Dr. Roda Shutters being out of the office.

## 2014-10-26 NOTE — Telephone Encounter (Signed)
Appointment r/s to 01/12/15.

## 2014-12-26 ENCOUNTER — Ambulatory Visit: Payer: Medicaid Other | Admitting: Neurology

## 2015-01-12 ENCOUNTER — Ambulatory Visit: Payer: Medicaid Other | Admitting: Neurology

## 2015-05-10 IMAGING — CT CT ANGIO AOBIFEM WO/W CM
1 of 9 series · 5 of 16 positions shown, 7 images · IV contrast (APPLIED)
Comparison: CT 01/12/2012

CLINICAL DATA: Preop evaluation.

CT ANGIOGRAPHY OF ABDOMINAL AORTA WITH ILIOFEMORAL RUNOFF
TECHNIQUE: Multidetector CT imaging of the abdomen, pelvis and
lower extremities was performed using the standard protocol during
bolus administration of intravenous contrast.  Multiplanar CT image
reconstructions including MIPs were obtained to evaluate the
vascular anatomy.
Contrast: 100mL OMNIPAQUE IOHEXOL 350 MG/ML SOLN

[Series 5: angiorunoff 3.0 b30f · axial · 0.74mm/px · z∈[-620,+340]mm · 5 of 480 slices shown, 7 images]
[im 80/480  soft-tissue]
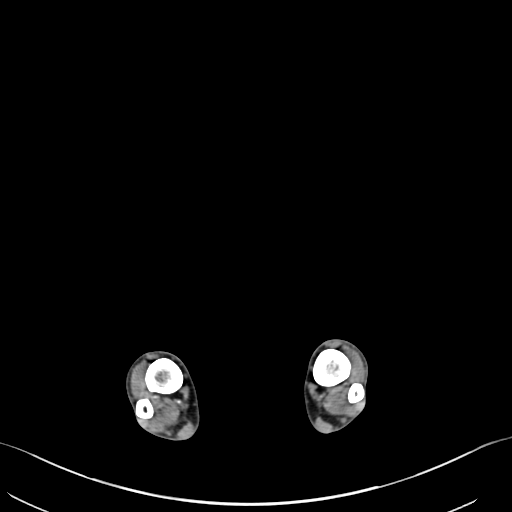
[im 80/480  bone]
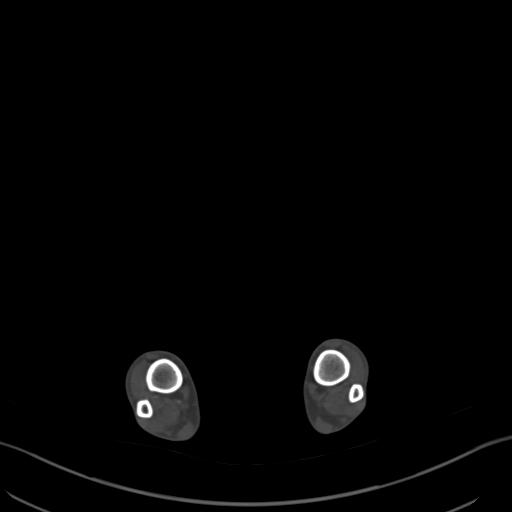
[im 160/480  soft-tissue]
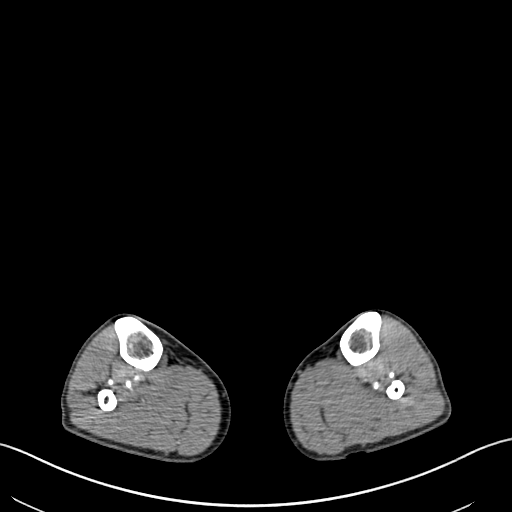
[im 240/480  soft-tissue]
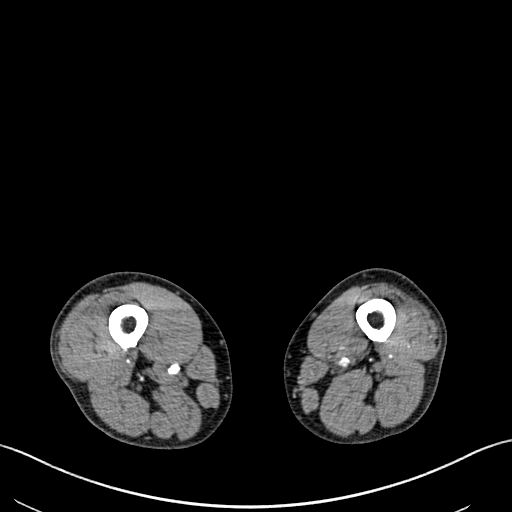
[im 320/480  soft-tissue]
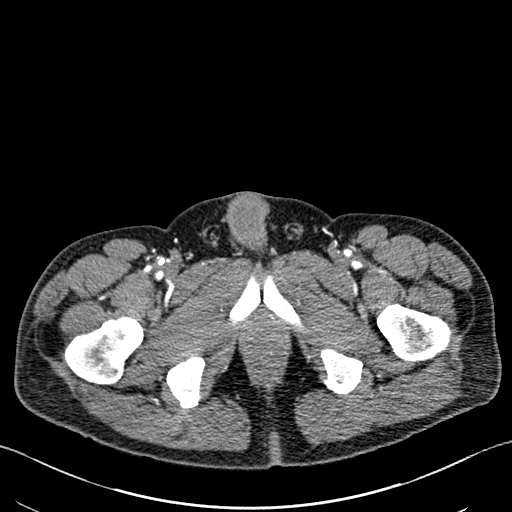
[im 400/480  soft-tissue]
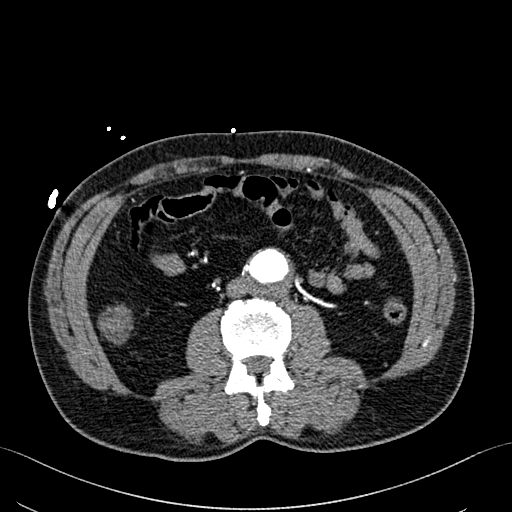
[im 400/480  bone]
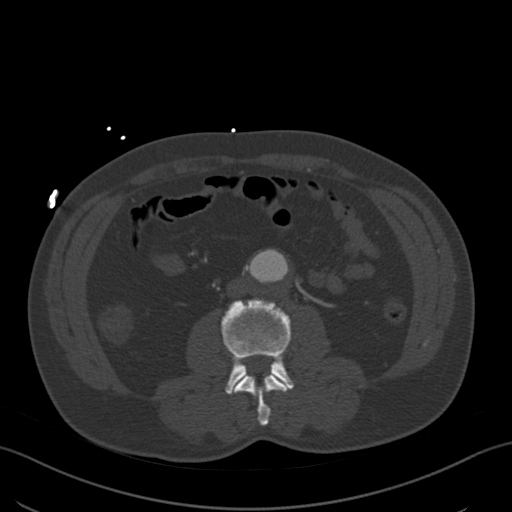

[5 of 16 positions shown; findings below may reference images not displayed]

FINDINGS: Aorta:  The infrarenal abdominal aortic aneurysm has mildly
enlarged, measuring 4.3 cm and previously measuring 4.1 cm.  Again
noted is a large amount of mural thrombus along the posterior
aspect of the aneurysm.  There is diffuse atherosclerotic disease
throughout the abdominal aorta with irregular plaque.  Diffuse
atherosclerotic disease involving the visceral arteries.  Stenosis
at the origin left renal artery with post stenotic dilatation.
Plaque and narrowing at the origin of the superior mesenteric
artery.  This appears to be an accessory visceral artery just right
of the SMA.  This appears to be supplying pancreatic-duodenal
branches and extending into the marginal artery.  Inferior
mesenteric artery is patent.

Again noted is occlusion or near occlusion of the proximal left
common iliac artery, measuring 1.5 cm.  Reconstitution of the left
internal and external iliac arteries.  Right iliac arteries are
heavily diseased but patent.  No significant narrowing in the right
external iliac artery.

Right Lower Extremity:  Mild narrowing in the proximal superficial
femoral artery.  The superficial femoral artery and deep femoral
arteries are patent.  Narrowing of the distal superficial femoral
artery with calcified plaque.  Popliteal artery is patent.  No
significant flow in the anterior tibial artery.  There is two-
vessel runoff in the right lower extremity, the dominant runoff
vessel is the posterior tibial artery.  No significant
reconstitution of the dorsalis pedis artery.

Left Lower Extremity:  Mild plaque in the left common femoral
artery.  Narrowing of the proximal left SFA.  The deep femoral
arteries are patent.  Segmental occlusions of the mid and distal
left SFA.  There is some reconstitution of the popliteal artery but
this vessel is poorly opacified.  There is probably flow in the
posterior tibial artery but poorly visualized.

Other findings:  No acute bony abnormality.  There is a known
cm nodule in the left lower lobe which has not significantly
changed.  New patchy parenchymal densities in the right lower lobe
are concerning for pneumonia or aspiration.  No evidence for free
air.

No gross abnormality to the liver, gallbladder or spleen.  Moderate
sized hiatal hernia.  Normal appearance of the adrenal glands,
pancreas and kidneys.  No significant lymphadenopathy or free
fluid.  Urinary bladder contains fluid.  Normal appearance of the
prostate.  A subtle fatty density along the left side of the
sigmoid colon is nonspecific.  This finding could be related to
prior inflammation.  Normal appearance of the appendix.

 Review of the MIP images confirms the above findings.
IMPRESSION: Patchy parenchymal densities in the right lower lobe.  Findings are
concerning for pneumonia or aspiration.

Mild enlargement of the infrarenal abdominal aortic aneurysm.  The
aneurysm now measures 4.3 cm.

Chronic occlusion or near occlusion of the left common iliac
artery.

Diffuse peripheral vascular disease.  Segmental occlusions of the
left mid and distal superficial femoral artery.  Poor opacification
of the left popliteal artery and left runoff vessels.  Disease in
the right superficial femoral artery without focal occlusion.  Two-
vessel runoff in the right lower extremity.

Diffuse atherosclerotic disease involving the visceral arteries
with areas of stenosis.

Stable 1.2 cm left lung nodule.

Hiatal hernia.

## 2016-02-20 ENCOUNTER — Encounter: Payer: Self-pay | Admitting: Family Medicine

## 2016-02-20 ENCOUNTER — Ambulatory Visit (HOSPITAL_COMMUNITY)
Admission: RE | Admit: 2016-02-20 | Discharge: 2016-02-20 | Disposition: A | Discharge status: Home / Self Care | Payer: Medicaid Other | Source: Ambulatory Visit | Attending: Family Medicine | Admitting: Family Medicine

## 2016-02-20 ENCOUNTER — Ambulatory Visit (INDEPENDENT_AMBULATORY_CARE_PROVIDER_SITE_OTHER): Payer: Medicaid Other | Admitting: Family Medicine

## 2016-02-20 VITALS — BP 184/103 | HR 87 | Temp 98.6°F | Wt 190.0 lb

## 2016-02-20 DIAGNOSIS — I63019 Cerebral infarction due to thrombosis of unspecified vertebral artery: Secondary | ICD-10-CM | POA: Diagnosis not present

## 2016-02-20 DIAGNOSIS — R0602 Shortness of breath: Secondary | ICD-10-CM | POA: Diagnosis not present

## 2016-02-20 DIAGNOSIS — R9431 Abnormal electrocardiogram [ECG] [EKG]: Secondary | ICD-10-CM | POA: Insufficient documentation

## 2016-02-20 DIAGNOSIS — I493 Ventricular premature depolarization: Secondary | ICD-10-CM | POA: Diagnosis not present

## 2016-02-20 DIAGNOSIS — R079 Chest pain, unspecified: Secondary | ICD-10-CM

## 2016-02-20 DIAGNOSIS — I10 Essential (primary) hypertension: Secondary | ICD-10-CM

## 2016-02-20 DIAGNOSIS — I714 Abdominal aortic aneurysm, without rupture, unspecified: Secondary | ICD-10-CM

## 2016-02-20 LAB — CBC
HCT: 49.4 % (ref 38.5–50.0)
HEMOGLOBIN: 16.7 g/dL (ref 13.2–17.1)
MCH: 29.2 pg (ref 27.0–33.0)
MCHC: 33.8 g/dL (ref 32.0–36.0)
MCV: 86.5 fL (ref 80.0–100.0)
MPV: 12.6 fL — ABNORMAL HIGH (ref 7.5–12.5)
Platelets: 182 10*3/uL (ref 140–400)
RBC: 5.71 MIL/uL (ref 4.20–5.80)
RDW: 14.5 % (ref 11.0–15.0)
WBC: 7.7 10*3/uL (ref 3.8–10.8)

## 2016-02-20 LAB — COMPLETE METABOLIC PANEL WITH GFR
ALBUMIN: 3.9 g/dL (ref 3.6–5.1)
ALK PHOS: 71 U/L (ref 40–115)
ALT: 18 U/L (ref 9–46)
AST: 16 U/L (ref 10–35)
BILIRUBIN TOTAL: 0.5 mg/dL (ref 0.2–1.2)
BUN: 12 mg/dL (ref 7–25)
CALCIUM: 9.3 mg/dL (ref 8.6–10.3)
CO2: 26 mmol/L (ref 20–31)
Chloride: 101 mmol/L (ref 98–110)
Creat: 1.1 mg/dL (ref 0.70–1.33)
GFR, EST NON AFRICAN AMERICAN: 74 mL/min (ref >=60)
GFR, Est African American: 86 mL/min (ref >=60)
Glucose, Bld: 85 mg/dL (ref 65–99)
POTASSIUM: 4.2 mmol/L (ref 3.5–5.3)
Sodium: 136 mmol/L (ref 135–146)
Total Protein: 6.4 g/dL (ref 6.1–8.1)

## 2016-02-20 MED ORDER — LISINOPRIL 20 MG PO TABS: 20.0000 mg | ORAL_TABLET | Freq: Every day | ORAL | 0 refills | Status: DC

## 2016-02-20 MED ORDER — CARVEDILOL 12.5 MG PO TABS: 12.5000 mg | ORAL_TABLET | Freq: Two times a day (BID) | ORAL | 0 refills | Status: DC

## 2016-02-20 MED ORDER — CLOPIDOGREL BISULFATE 75 MG PO TABS: 75.0000 mg | ORAL_TABLET | Freq: Every day | ORAL | 0 refills | Status: DC

## 2016-02-20 MED ORDER — ATORVASTATIN CALCIUM 80 MG PO TABS: 80.0000 mg | ORAL_TABLET | Freq: Every day | ORAL | 0 refills | Status: DC

## 2016-02-20 NOTE — Assessment & Plan Note (Signed)
BP not controlled. Most likely secondary to medication non-compliance. I suspect HA is secondary to uncontrolled BP and it seems his BP has been significantly elevated for some time. - CMET and CBC to look for end organ damage - restart Coreg and lisinopril - advised pt to f/u in 2 weeks with PCP  - discussed strict return precautions.

## 2016-02-20 NOTE — Progress Notes (Signed)
Subjective: CC: spots in vision and uncontrolled HTN HPI: Patient is a 55 y.o. male with a past medical history of AAA, CVA, CAD, PVD, alcohol abuse, uncontrolled HTN ,and chronic chest pain presenting to clinic today for a same day appt for changes in vision and uncontrolled BP.  Patient is seeing spots of light for 1 month. Denies hales or flickering light. Denies seeing them currently but notes they occur 80-90% of the time. He noted blurred vision in the R eye 3 weeks ago that resolved. He notes that it comes and goes intermittently without cuase. No headaches. No issues with urination. No abdominal pain or back pain. Occasional chest pain and tightness since his heart surgery 2010 without worsening or change. No pain currently. He also notes dyspnea on exertion that is stable since 2010.   He's supposed to be on Coreg 12.5mg  BID and lisinopril 20mg  daily for HTN as well as Plavix for CVAs. He has been out of all his medications x 2 months.   Social History: former smoker  ROS: All other systems reviewed and are negative.  Past Medical History Patient Active Problem List   Diagnosis Date Noted  . 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 Hypertension, uncontrolled 06/15/2014  . HLD (hyperlipidemia) 04/05/2014  . Adjustment reaction 03/02/2014  . Dysmetria 02/08/2014  . CVA (cerebral infarction) 02/08/2014  . Muscle strain of lower extremity 11/25/2013  . Chest pain 10/23/2013  . PVD (peripheral vascular disease) with claudication (HCC) 05/09/2013  . Alcohol consumption heavy 03/28/2013  . GERD (gastroesophageal reflux disease) 03/28/2013  . Pain in limb 01/03/2013  . Low back pain 10/28/2012  . AAA (abdominal aortic aneurysm) without rupture (HCC) 10/28/2012  . Peripheral vascular disease (HCC) 02/12/2012  . Anxiety 02/12/2012  . Uncontrolled hypertension 02/02/2012  . Tobacco abuse 02/02/2012  . Financial difficulties 02/02/2012  .  Occlusion and stenosis of carotid artery without mention of cerebral infarction 01/19/2012  . Pulmonary nodule 05/19/2011  . HYPERLIPIDEMIA-MIXED 10/19/2008  . OTH MIXED/UNSPEC NONDEPENDENT DRUG ABUSE UNSPEC 10/19/2008  . CAD, ARTERY BYPASS GRAFT 10/19/2008  . DISC DISEASE, LUMBAR 10/19/2008  . CHEST PAIN-UNSPECIFIED 10/19/2008    Medications- reviewed and updated  Objective: Office vital signs reviewed. BP (!) 184/103   Pulse 87   Temp 98.6 F (37 C) (Oral)   Wt 190 lb (86.2 kg)   SpO2 97%   BMI 27.26 kg/m    Physical Examination:  General: Awake, alert, well- nourished, NAD ENMT:  TMs intact, normal light reflex, no erythema, no bulging. Nasal turbinates moist. MMM, Oropharynx clear without erythema or tonsillar exudate/hypertrophy Eyes: Conjunctiva non-injected. PERRL. Normal fundoscopic exam.  Cardio: RRR, no m/r/g noted.  Pulm: No increased WOB.  CTAB, without wheezes, rhonchi or crackles noted.  GI: soft, NT/ND,+BS x4, no hepatomegaly, no splenomegaly Extremities: No edema, cyanosis or clubbing. MSK: Normal gait and station Skin: dry, intact, no rashes. Normal DTRs.   EKG: HR 79, no ST elevation or depression.  Significant change since previous.   Assessment/Plan: AAA (abdominal aortic aneurysm) without rupture Measured 4.3cm in July 2014. Stressed strict BP control and dicussed that this could rupture and lead to death, especially if his BPs are not well controlled. - will get an ultrasound to see what, if any progression has been made.   Hypertension, uncontrolled BP not controlled. Most likely secondary to medication non-compliance. I suspect HA is secondary to uncontrolled BP and it seems his BP has been significantly elevated  for some time. - CMET and CBC to look for end organ damage - restart Coreg and lisinopril - advised pt to f/u in 2 weeks with PCP  - discussed strict return precautions.   Chest pain Denies today. Seems to be chronic in nature without  change from 2010. EKG unremarkable.  - discussed return precautions  - pt may benefit from cardiology referral in the future given his past medical history.   Orders Placed This Encounter  Procedures  . US ABDOMINAL AORTA SCREENING AAA    Standing Status:   Future    Standing Expiration Date:   04/21/2017    Order Specific Question:   Reason for Exam (SYMPTOM  OR DIAGNOSIS REQUIRED)    Answer:   AAA 4.3 noted in 2014, uncontrolled HTN    Order Specific Question:   Preferred imaging location?    Answer[facility]  . COMPLETE METABOLIC PANEL WITH GFR  . CBC  . EKG 12-Lead    Meds ordered this encounter  Medications  . atorvastatin (LIPITOR) 80 MG tablet    Sig: Take 1 tablet (80 mg total) by mouth daily.    Dispense:  90 tablet    Refill:  0  . carvedilol (COREG) 12.5 MG tablet    Sig: Take 1 tablet (12.5 mg total) by mouth 2 (two) times daily with a meal.    Dispense:  90 tablet    Refill:  0  . clopidogrel (PLAVIX) 75 MG tablet    Sig: Take 1 tablet (75 mg total) by mouth daily with breakfast.    Dispense:  30 tablet    Refill:  0  . lisinopril (PRINIVIL,ZESTRIL) 20 MG tablet    Sig: Take 1 tablet (20 mg total) by mouth daily.    Dispense:  90 tablet    Refill:  0    Joanna Puffrystal S. Dorsey PGY-3 [facility]

## 2016-02-20 NOTE — Patient Instructions (Signed)
Your blood pressure was not controlled today. This is very dangerous given how high your blood pressure was. You also have was called an abdominal aortic aneurysm, that with uncontrolled blood pressure, can rupture and kill you quickly.  This is why we need to make sure your blood pressure is under control. Take all your medications a prescribed. I have ordered an ultrasound to see if the aneurysm has gotten bigger. If you chest pain or your shortness of breath changes at all, seek immediate care.   Abdominal Aortic Aneurysm An aneurysm is a weakened or damaged part of an artery wall that bulges from the normal force of blood pumping through the body. An abdominal aortic aneurysm is an aneurysm that occurs in the lower part of the aorta, the main artery of the body.  The major concern with an abdominal aortic aneurysm is that it can enlarge and burst (rupture) or blood can flow between the layers of the wall of the aorta through a tear (aorticdissection). Both of these conditions can cause bleeding inside the body and can be life threatening unless diagnosed and treated promptly. CAUSES  The exact cause of an abdominal aortic aneurysm is unknown. Some contributing factors are:   A hardening of the arteries caused by the buildup of fat and other substances in the lining of a blood vessel (arteriosclerosis).  Inflammation of the walls of an artery (arteritis).   Connective tissue diseases, such as Marfan syndrome.   Abdominal trauma.   An infection, such as syphilis or staphylococcus, in the wall of the aorta (infectious aortitis) caused by bacteria. RISK FACTORS  Risk factors that contribute to an abdominal aortic aneurysm may include:  Age older than 60 years.   High blood pressure (hypertension).  Male gender.  Ethnicity (white race).  Obesity.  Family history of aneurysm (first degree relatives only).  Tobacco use. PREVENTION  The following healthy lifestyle habits may  help decrease your risk of abdominal aortic aneurysm:  Quitting smoking. Smoking can raise your blood pressure and cause arteriosclerosis.  Limiting or avoiding alcohol.  Keeping your blood pressure, blood sugar level, and cholesterol levels within normal limits.  Decreasing your salt intake. In somepeople, too much salt can raise blood pressure and increase your risk of abdominal aortic aneurysm.  Eating a diet low in saturated fats and cholesterol.  Increasing your fiber intake by including whole grains, vegetables, and fruits in your diet. Eating these foods may help lower blood pressure.  Maintaining a healthy weight.  Staying physically active and exercising regularly. SYMPTOMS  The symptoms of abdominal aortic aneurysm may vary depending on the size and rate of growth of the aneurysm.Most grow slowly and do not have any symptoms. When symptoms do occur, they may include:  Pain (abdomen, side, lower back, or groin). The pain may vary in intensity. A sudden onset of severe pain may indicate that the aneurysm has ruptured.  Feeling full after eating only small amounts of food.  Nausea or vomiting or both.  Feeling a pulsating lump in the abdomen.  Feeling faint or passing out. DIAGNOSIS  Since most unruptured abdominal aortic aneurysms have no symptoms, they are often discovered during diagnostic exams for other conditions. An aneurysm may be found during the following procedures:  Ultrasonography (A one-time screening for abdominal aortic aneurysm by ultrasonography is also recommended for all men aged 65-75 years who have ever smoked).  X-ray exams.  A computed tomography (CT).  Magnetic resonance imaging (MRI).  Angiography or arteriography.  TREATMENT  Treatment of an abdominal aortic aneurysm depends on the size of your aneurysm, your age, and risk factors for rupture. Medication to control blood pressure and pain may be used to manage aneurysms smaller than 6 cm.  Regular monitoring for enlargement may be recommended by your caregiver if:  The aneurysm is 3-4 cm in size (an annual ultrasonography may be recommended).  The aneurysm is 4-4.5 cm in size (an ultrasonography every 6 months may be recommended).  The aneurysm is larger than 4.5 cm in size (your caregiver may ask that you be examined by a vascular surgeon). If your aneurysm is larger than 6 cm, surgical repair may be recommended. There are two main methods for repair of an aneurysm:   Endovascular repair (a minimally invasive surgery). This is done most often.  Open repair. This method is used if an endovascular repair is not possible.   This information is not intended to replace advice given to you by your health care provider. Make sure you discuss any questions you have with your health care provider.   Document Released: 03/26/2005 Document Revised: 10/11/2012 Document Reviewed: 07/16/2012 Elsevier Interactive Patient Education [employer]2016 Elsevier Inc.

## 2016-02-20 NOTE — Assessment & Plan Note (Signed)
Measured 4.3cm in July 2014. Stressed strict BP control and dicussed that this could rupture and lead to death, especially if his BPs are not well controlled. - will get an ultrasound to see what, if any progression has been made.

## 2016-02-20 NOTE — Assessment & Plan Note (Signed)
Denies today. Seems to be chronic in nature without change from 2010. EKG unremarkable.  - discussed return precautions  - pt may benefit from cardiology referral in the future given his past medical history.

## 2016-02-21 ENCOUNTER — Other Ambulatory Visit: Payer: Self-pay | Admitting: Student in an Organized Health Care Education/Training Program

## 2016-02-21 ENCOUNTER — Telehealth: Payer: Self-pay | Admitting: Student in an Organized Health Care Education/Training Program

## 2016-02-21 MED ORDER — CYCLOBENZAPRINE HCL 10 MG PO TABS: 10.0000 mg | ORAL_TABLET | Freq: Three times a day (TID) | ORAL | 0 refills | Status: DC | PRN

## 2016-02-21 NOTE — Telephone Encounter (Signed)
Patient had called asking for several drug refills including Flexeril, trazodone, Robaxn, and Zantac.  I called to speak with the patient regarding these medications.    He states that he has been having severe back pain consistent with previous back pain symptoms for which he has seen Dr. Jarvis Newcomer in the past.  He was seen in the clinic yesterday but the focus of the visit was his HTN.  We agreed over the phone that I would refill his flexeril x 30 pills and he would call through the front office to schedule an appointment to see me or another provider for follow up hypertension as well as to discuss his current back pain symptoms.  The patient has a history of AAA and uncontrolled HTN.  He was advised that if experiences severely worsened back pain he should call the office and come in through the ED.

## 2016-02-21 NOTE — Telephone Encounter (Signed)
pt is calling because he said we only filled 4 of his 9 prescriptions. He still needs Zantac, Flexeril, Tramadol, Trazodone, and Robaxin called in. jw

## 2016-02-26 ENCOUNTER — Other Ambulatory Visit: Payer: Self-pay | Admitting: Family Medicine

## 2016-02-26 ENCOUNTER — Encounter: Payer: Self-pay | Admitting: Family Medicine

## 2016-02-26 ENCOUNTER — Telehealth: Payer: Self-pay | Admitting: Family Medicine

## 2016-02-26 DIAGNOSIS — I714 Abdominal aortic aneurysm, without rupture, unspecified: Secondary | ICD-10-CM

## 2016-02-26 NOTE — Telephone Encounter (Signed)
Called to discuss denial of AAA U/S for a peer to peer review. Advised to change this to a limited view, CPT code 02725, however notes that it is certainly approved.   Approval code [id number]

## 2016-02-26 NOTE — Telephone Encounter (Signed)
When CPT code 05110 put into orders, US renal limited results instead of limited retroperitoneal ultrasound. I spoke with radiology who notes that in their book, CPT code 21117 is [place] limited retroperitoneal. Placed this order. Will send FYI to [facility] who has been handling the scheduling of this procedure to make sure the patients gets the limited view that was just ordered instead of the complete view that was ordered previously.  Joanna Puff, MD [facility] Resident  02/26/2016, 3:55 PM

## 2016-02-29 ENCOUNTER — Other Ambulatory Visit: Payer: Self-pay | Admitting: Family Medicine

## 2016-02-29 ENCOUNTER — Ambulatory Visit (HOSPITAL_COMMUNITY)
Admission: RE | Admit: 2016-02-29 | Discharge: 2016-02-29 | Disposition: A | Discharge status: Home / Self Care | Payer: Medicaid Other | Source: Ambulatory Visit | Attending: Family Medicine | Admitting: Family Medicine

## 2016-02-29 DIAGNOSIS — I714 Abdominal aortic aneurysm, without rupture, unspecified: Secondary | ICD-10-CM

## 2016-03-24 ENCOUNTER — Other Ambulatory Visit: Payer: Self-pay | Admitting: Family Medicine

## 2016-03-24 DIAGNOSIS — I63019 Cerebral infarction due to thrombosis of unspecified vertebral artery: Secondary | ICD-10-CM

## 2016-03-24 NOTE — Telephone Encounter (Signed)
Please let the patient know that his abdominal aortic aneurysm is stable. He will need a repeat ultrasound done in 1 year.  FYI to PCP.  Joanna Puff, MD [facility] Resident  03/24/2016, 7:21 AM

## 2016-03-25 NOTE — Addendum Note (Signed)
Addended by: Garen Grams F on: 03/25/2016 09:02 AM   Modules accepted: Orders

## 2016-03-25 NOTE — Telephone Encounter (Signed)
Patient informed, requesting refills on plavix and flexeril, has appointment scheduled with PCP on 10/2.

## 2016-03-30 NOTE — Progress Notes (Signed)
CC: High blood pressure follow up and dyspnea  HPI: Randy PalmerKeith [name] is a 55 y.o. male with a PMH significant for HTN, PVD, AAA >4cm (stable 12/2015) and history of CVA who [facility] follow up.  Patient was previously seen in august, which was his first visit in over a year and he had been off of his blood pressure medications. His blood pressure was elevated at that visit and he was restarted on his previous medications (coreg, lisinopril) and additionally sent for abdominal US for his AAA with recommendation to follow up. He presents today for HTN follow up with ongoing dyspnea, also needs all of his medications refilled.  HTN - Today, patient endorses taking his medications as prescribed for the past month.   - He endorses photopsias with a flash of light that moves across his vision about once/day, he feels this has improved over the past month with restarting his HTN medications.  He is unable to indicate whether these visual changes are bilateral or homonymous.   - He endorses chest pain which is chronic and not related to activity, and feels this is more a shortness of breath than specifically pain. - Denies N/V/D/C - Denies changes in urination  Dyspnea - Patient notes he sleeps with 3 pillows under his head.  When he lies flat he is unable to get air in.  - He sleeps with multiple fans on him in his room and feels he sometimes wakes up short of breath - He can walk <1 block and then becomes dyspneic, has to stop and catch his breath - He endorses a recent episode of LOC in which he was coughing very hard and lost consciousness for about one moment, did not fall, was sitting in a chair and it was witnessed  Back Pain - Chronic sacral back pain which has bothered him for many years, previously worked in [employer] - Endorses improvement with flexeril.  When he is out of flexeril he uses ibuprofen.   Review of Symptoms: See HPI for ROS.   CC, SH/smoking status, and VS  noted.  Objective: BP (!) 179/95   Pulse 71   Temp 98.4 F (36.9 C) (Oral)   Ht 5\' 10"  (1.778 m)   Wt 85.7 kg (189 lb)   BMI 27.12 kg/m  GEN: NAD, alert, cooperative, and pleasant. EYE: no conjunctival injection, pupils equally round and reactive to light, EOMI ENMT: no nasal polyps,no rhinorrhea, no pharyngeal erythema or exudates. +dental carries noted NECK/BACK: +left and midline sacral back pain, neck full ROM, no thyromegally RESPIRATORY: clear to auscultation bilaterally with no wheezes, rhonchi or rales, good effort CV: RRR, no m/r/g, no peripheral edema GI: soft, non-tender, non-distended, normoactive bowel sounds, no hepatosplenomegaly SKIN: warm and dry, no rashes or lesions NEURO: II-XII grossly intact, normal gait, peripheral sensation intact PSYCH: AAOx3, appropriate affect  02/29/2016 Abdominal Ultrasound Abdominal Aortic Aneurism 4.07 cm in A/P diameter, stable from previous study  Assessment and plan:  Hypertension, uncontrolled - Blood pressure uncontrolled on today's visit at 179/95.   - Continue coreg - increase lisinopril to 40 mg QD - weight stable from previous visit; will not start diuretic at this time - will get repeat BMET now that he has been on ACE x 1 month after previously being off of the medication. Cr WNL on previous study 1 month ago. - Ambulatory referral to ophthalmology for photopsias: concern for retinal detachment - patient to follow up in 2 weeks for continued HTN  management  AAA (abdominal aortic aneurysm) without rupture Patient underwent abdominal ultrasound on 9/1. - AAA 4.07 cm in A/P diameter, stable from previous study - Will repeat study in one year (02/28/2017)  Orthopnea - Referal for cardiac echo (previous study in 2015 showed G1DD) - Optimize HTN management - consider Cardiology referral in future, will hold off and see what echo shows for now - No weight gain or peripheral edema on today's visit; can consider diuretic as  needed in the future - Will check lipid level now that patient has been on statin x1 month for therapy optimization  Low back pain Chronic, sacral back pain without neurologic changes - continue flexeril - continue to monitor - advised against NSAID use given history of GERD and CAD   Orders Placed This Encounter  Procedures  . Lipid panel  . BASIC METABOLIC PANEL WITH GFR  . Ambulatory referral to Ophthalmology    Referral Priority:   Routine    Referral Type:   Consultation    Referral Reason:   Specialty Services Required    Requested Specialty:   Ophthalmology    Number of Visits Requested:   1  . ECHOCARDIOGRAM COMPLETE BUBBLE STUDY    Standing Status:   Future    Standing Expiration Date:   07/01/2017    Order Specific Question:   Where should this test be performed    Answer:   [facility]    Order Specific Question:   Does the patient weigh less than or greater than 250 lbs?    Answer:   Patient weighs less than 250 lbs    Order Specific Question:   Complete or Limited study?    Answer:   Complete    Order Specific Question:   Does the patient have a known history of hypersensitivity to Perflutren (aka Hospital doctor for echocardiograms - CHECK ALLERGIES)    Answer:   No    Order Specific Question:   ADMINISTER PERFLUTERN    Answer:   ADMINISTER PERFLUTREN    Order Specific Question:   Expected Date:    Answer:   ASAP    Order Specific Question:   Reason for exam-Echo    Answer:   Dyspnea  786.09 / R06.00    Meds ordered this encounter  Medications  . aspirin EC 81 MG tablet    Sig: Take 1 tablet (81 mg total) by mouth daily.    Dispense:  30 tablet    Refill:  2  . atorvastatin (LIPITOR) 80 MG tablet    Sig: Take 1 tablet (80 mg total) by mouth daily.    Dispense:  90 tablet    Refill:  0  . carvedilol (COREG) 12.5 MG tablet    Sig: Take 1 tablet (12.5 mg total) by mouth 2 (two) times daily with a meal.    Dispense:  90  tablet    Refill:  0  . cyclobenzaprine (FLEXERIL) 10 MG tablet    Sig: Take 1 tablet (10 mg total) by mouth 3 (three) times daily as needed for muscle spasms.    Dispense:  30 tablet    Refill:  3  . clopidogrel (PLAVIX) 75 MG tablet    Sig: Take 1 tablet (75 mg total) by mouth daily with breakfast.    Dispense:  30 tablet    Refill:  0  . ranitidine (ZANTAC) 300 MG tablet    Sig: Take 1 tablet (300 mg total) by mouth  daily.    Dispense:  60 tablet    Refill:  0  . lisinopril (PRINIVIL,ZESTRIL) 40 MG tablet    Sig: Take 1 tablet (40 mg total) by mouth daily.    Dispense:  60 tablet    Refill:  2     Howard Pouch, MD,MS,  PGY1 03/31/2016 12:27 PM

## 2016-03-31 ENCOUNTER — Encounter: Payer: Self-pay | Admitting: Student in an Organized Health Care Education/Training Program

## 2016-03-31 ENCOUNTER — Ambulatory Visit (INDEPENDENT_AMBULATORY_CARE_PROVIDER_SITE_OTHER): Payer: Medicaid Other | Admitting: Student in an Organized Health Care Education/Training Program

## 2016-03-31 VITALS — BP 179/95 | HR 71 | Temp 98.4°F | Ht 70.0 in | Wt 189.0 lb

## 2016-03-31 DIAGNOSIS — I714 Abdominal aortic aneurysm, without rupture, unspecified: Secondary | ICD-10-CM

## 2016-03-31 DIAGNOSIS — I63019 Cerebral infarction due to thrombosis of unspecified vertebral artery: Secondary | ICD-10-CM

## 2016-03-31 DIAGNOSIS — M545 Low back pain, unspecified: Secondary | ICD-10-CM

## 2016-03-31 DIAGNOSIS — G8929 Other chronic pain: Secondary | ICD-10-CM | POA: Diagnosis not present

## 2016-03-31 DIAGNOSIS — Z23 Encounter for immunization: Secondary | ICD-10-CM | POA: Diagnosis not present

## 2016-03-31 DIAGNOSIS — R0601 Orthopnea: Secondary | ICD-10-CM | POA: Diagnosis not present

## 2016-03-31 DIAGNOSIS — I10 Essential (primary) hypertension: Secondary | ICD-10-CM | POA: Diagnosis not present

## 2016-03-31 LAB — BASIC METABOLIC PANEL WITH GFR
BUN: 13 mg/dL (ref 7–25)
CO2: 28 mmol/L (ref 20–31)
CREATININE: 1.11 mg/dL (ref 0.70–1.33)
Calcium: 9 mg/dL (ref 8.6–10.3)
Chloride: 100 mmol/L (ref 98–110)
GFR, EST AFRICAN AMERICAN: 85 mL/min (ref >=60)
GFR, Est Non African American: 73 mL/min (ref >=60)
GLUCOSE: 94 mg/dL (ref 65–99)
POTASSIUM: 4.6 mmol/L (ref 3.5–5.3)
Sodium: 137 mmol/L (ref 135–146)

## 2016-03-31 LAB — LIPID PANEL
CHOL/HDL RATIO: 6.6 ratio — AB (ref <=5.0)
CHOLESTEROL: 264 mg/dL — AB (ref 125–200)
HDL: 40 mg/dL (ref >=40)
LDL Cholesterol: 190 mg/dL — ABNORMAL HIGH (ref <130)
TRIGLYCERIDES: 168 mg/dL — AB (ref <150)
VLDL: 34 mg/dL — AB (ref <30)

## 2016-03-31 MED ORDER — CLOPIDOGREL BISULFATE 75 MG PO TABS: 75.0000 mg | ORAL_TABLET | Freq: Every day | ORAL | 0 refills | Status: DC

## 2016-03-31 MED ORDER — LISINOPRIL 40 MG PO TABS: 40.0000 mg | ORAL_TABLET | Freq: Every day | ORAL | 2 refills | Status: DC

## 2016-03-31 MED ORDER — ASPIRIN EC 81 MG PO TBEC: 81.0000 mg | DELAYED_RELEASE_TABLET | Freq: Every day | ORAL | 2 refills | Status: AC

## 2016-03-31 MED ORDER — ATORVASTATIN CALCIUM 80 MG PO TABS: 80.0000 mg | ORAL_TABLET | Freq: Every day | ORAL | 0 refills | Status: DC

## 2016-03-31 MED ORDER — CYCLOBENZAPRINE HCL 10 MG PO TABS: 10.0000 mg | ORAL_TABLET | Freq: Three times a day (TID) | ORAL | 3 refills | Status: DC | PRN

## 2016-03-31 MED ORDER — CARVEDILOL 12.5 MG PO TABS: 12.5000 mg | ORAL_TABLET | Freq: Two times a day (BID) | ORAL | 0 refills | Status: DC

## 2016-03-31 MED ORDER — RANITIDINE HCL 300 MG PO TABS: 300.0000 mg | ORAL_TABLET | Freq: Every day | ORAL | 0 refills | Status: DC

## 2016-03-31 NOTE — Assessment & Plan Note (Signed)
-   Blood pressure uncontrolled on today's visit at 179/95.   - Continue coreg - increase lisinopril to 40 mg QD - weight stable from previous visit; will not start diuretic at this time - will get repeat BMET now that he has been on ACE x 1 month after previously being off of the medication. Cr WNL on previous study 1 month ago. - Ambulatory referral to ophthalmology for photopsias: concern for retinal detachment - patient to follow up in 2 weeks for continued HTN management

## 2016-03-31 NOTE — Assessment & Plan Note (Addendum)
-   Referal for cardiac echo (previous study in 2015 showed G1DD) - Optimize HTN management - consider Cardiology referral in future, will hold off and see what echo shows for now - No weight gain or peripheral edema on today's visit; can consider diuretic as needed in the future - Will check lipid level now that patient has been on statin x1 month for therapy optimization

## 2016-03-31 NOTE — Patient Instructions (Addendum)
It was a pleasure seeing you today in our clinic. Today we discussed your blood pressure, your back pain and your vision. Here is the treatment plan we have discussed and agreed upon together:  Blood Pressure - your pressure was improved at this visit, however continues to be elevated at 179/95 on your current medication regimen.  We made the following adjustment: - Lisinapril - you currently have 20 mg tablets to take daily. Please take 2 of these at time (for a total of 40 mg) until you run out. Then I ordered for 40 mg tablets which you can take one of each day. - Cardiac echo - We will take an ultrasound picture of your heart. This will be scheduled by our office and our office will inform you of the time and date of the appointment.  Vision  - you are seeing flashes of light.   - Opthalmology referral - the opthalmologist's office will call you to set up an appointment. If the office does not call you within two weeks, please give our office a call.  Back Pain - Continue to use flexeril as prescribed for your back pain.  Please do not use the ibuprofen as this medication may make your reflux worse and also is not recommended with your cardiac history.  Please schedule an appointment to be seen in two weeks for a follow up visit. Our clinic's number is [phone]. Please call with questions or concerns about what we discussed today.  - Dr. Mosetta Putt

## 2016-03-31 NOTE — Assessment & Plan Note (Signed)
Patient underwent abdominal ultrasound on 9/1. - AAA 4.07 cm in A/P diameter, stable from previous study - Will repeat study in one year (02/28/2017)

## 2016-03-31 NOTE — Assessment & Plan Note (Signed)
Chronic, sacral back pain without neurologic changes - continue flexeril - continue to monitor - advised against NSAID use given history of GERD and CAD

## 2016-04-10 NOTE — Progress Notes (Signed)
CC: HTN Follow up  HPI: Randy [name] is a 55 y.o. male with a PMH significant for HTN, PVD, AAA >4cm (stable 12/2015) and history of CVA who presents to [facility] today for follow up blood pressure, also with complaints of poor sleep.  Patient was previously seen on 10/2 at which time he was noted to have persistent HTN at 179/95 and lisinopril dose was increased.  Basic labs were drawn at that visit and he was also referred for echocardiogram for PND symptoms.   Insomnia - Patient falls asleep watching TV around 8-9 PM, wakes 2-4 times throughout the night and watches TV to fall back to sleep - wakes around 5 AM, drinks a pot of coffee and caffeinated beverages throughout the day until bedtime - TV present in bedroom - Wakes nightly - tired throughout day - drinks 1 beer nightly  HTN - currently takes lisinopril, coreg - denies challenges in taking medication, reports 100% compliance -  dyspnea, edema, endorses CP with indigestion that improves with PPI, no CP on exertion - endorses floating bright light across field of vision that occurs occasionally, however did follow up with opthalmology and reports he was evaluated for retinal detachment with negative results, will follow up with ophtho in one year.   HLD - currently taking atorvastatin 80 mg qd, denies challenges in taking medication, reports 100% compliance - cholesterol recently checked at previous visit; results unfavorable with elevated cholesterol and LDL despite 1 month of statin therapy  Cardiac - cardiac echo was ordered with results pending   Review of Symptoms: See HPI for ROS.   CC, SH/smoking status, and VS noted.  Objective: BP (!) 140/96   Pulse 68   Temp 98.4 F (36.9 C) (Oral)   Ht 5\' 10"  (1.778 m)   Wt 187 lb 3.2 oz (84.9 kg)   SpO2 95%   BMI 26.86 kg/m  GEN: NAD, alert, cooperative, and pleasant. NECK: full ROM, no thyromegaly RESPIRATORY: clear to auscultation bilaterally with no wheezes, rhonchi or  rales, good effort CV: RRR, no m/r/g, no peripheral edema GI: soft, non-tender, non-distended, normoactive bowel sounds, no hepatosplenomegaly NEURO: II-XII grossly intact, normal gait, peripheral sensation intact PSYCH: AAOx3, appropriate affect  Lipid Panel 10/2 Cholesterol 264, Triglycerides 168, HDL 40, LDL 190  Assessment and plan:  Uncontrolled hypertension BP initially elevated with repeat/manual pressure read at 158/102. Initially planned to start chlorthalidone however BP improved to 140/96 on repeat at the end of the visit - Plan to follow BP closely given elevated pressures x2 this visit that then came near goal - BP goal 140/90 - will hold off on starting another med at this time - continue coreg, lisinopril - encouraged pt to check BP qweekly at pharmacy and record pressures  Orthopnea - cardiac echo completed however results not available in epic - will call to follow up results with radiology  HLD (hyperlipidemia) - atorvastatin 80 mg, continue QD - counseled on diet/exercise/lifestyle modifications briefly, however more counseling required - will continue to discuss dietary modifications at next visit  Insomnia - Discussed sleep hygiene - Encouraged bedroom for sleep only, decrease alcohol use, problem solve prior to bed, get up if not sleeping - Patient came up with goals: no caffeine after 3 PM, will try to cut down on coffee (which will improve HTN and GERD symptoms too). Will try to decrease television/screen time prior to sleep.   No orders of the defined types were placed in this encounter.   Meds ordered  this encounter  Medications  . DISCONTD: chlorthalidone (HYGROTON) 25 MG tablet    Sig: Take 1 tablet (25 mg total) by mouth daily.    Dispense:  30 tablet    Refill:  3     Howard PouchLauren Anup Brigham, MD,MS,  PGY1 04/11/2016 4:56 PM

## 2016-04-11 ENCOUNTER — Encounter: Payer: Self-pay | Admitting: Student in an Organized Health Care Education/Training Program

## 2016-04-11 ENCOUNTER — Ambulatory Visit (INDEPENDENT_AMBULATORY_CARE_PROVIDER_SITE_OTHER): Payer: Medicaid Other | Admitting: Student in an Organized Health Care Education/Training Program

## 2016-04-11 DIAGNOSIS — R0601 Orthopnea: Secondary | ICD-10-CM | POA: Diagnosis not present

## 2016-04-11 DIAGNOSIS — E782 Mixed hyperlipidemia: Secondary | ICD-10-CM

## 2016-04-11 DIAGNOSIS — G4700 Insomnia, unspecified: Secondary | ICD-10-CM | POA: Insufficient documentation

## 2016-04-11 DIAGNOSIS — I10 Essential (primary) hypertension: Secondary | ICD-10-CM

## 2016-04-11 MED ORDER — CHLORTHALIDONE 25 MG PO TABS: 25.0000 mg | ORAL_TABLET | Freq: Every day | ORAL | 3 refills | Status: DC

## 2016-04-11 NOTE — Assessment & Plan Note (Signed)
-   cardiac echo completed however results not available in epic - will call to follow up results with radiology

## 2016-04-11 NOTE — Assessment & Plan Note (Signed)
-   atorvastatin 80 mg, continue QD - counseled on diet/exercise/lifestyle modifications briefly, however more counseling required - will continue to discuss dietary modifications at next visit

## 2016-04-11 NOTE — Assessment & Plan Note (Signed)
BP initially elevated with repeat/manual pressure read at 158/102. Initially planned to start chlorthalidone however BP improved to 140/96 on repeat at the end of the visit - Plan to follow BP closely given elevated pressures x2 this visit that then came near goal - BP goal 140/90 - will hold off on starting another med at this time - continue coreg, lisinopril - encouraged pt to check BP qweekly at pharmacy and record pressures

## 2016-04-11 NOTE — Assessment & Plan Note (Signed)
-   Discussed sleep hygiene - Encouraged bedroom for sleep only, decrease alcohol use, problem solve prior to bed, get up if not sleeping - Patient came up with goals: no caffeine after 3 PM, will try to cut down on coffee (which will improve HTN and GERD symptoms too). Will try to decrease television/screen time prior to sleep.

## 2016-04-11 NOTE — Patient Instructions (Addendum)
It was a pleasure seeing you today in our clinic. Today we discussed blood pressure, sleep and cholesterol. Here is the treatment plan we have discussed and agreed upon together:  Blood Pressure: Your blood pressure was improved on today's visit.  Your goal blood pressure is 140/90. - Please take your blood pressure once weekly at the pharmacy and record the value - Please come in for a nurse's visit to check your blood pressure in 1 week, and schedule a follow up in 2 weeks.  Sleep: We talked about sleep hygiene with specific goals that may improve your sleep. - Specifically, we discussed stopping all caffeine use at 3 pm including soda, tea and coffee. You mentioned drinking gatorade instead in the afternoons and evenings. - We discussed trying to push bedtime back to a later time which may help you to sleep through the night better   Cholesterol - We will continue to follow your cholesterol levels on the current medication regimen, however we can work towards diet and exercise lifestyle modifications to improve the cholesterol further. - I included cholesterol information below.  Our clinic's number is [phone]. Please call with questions or concerns about what we discussed today.  - Dr. Mosetta PuttFeng    Cholesterol Cholesterol is a white, waxy, fat-like substance needed by your body in small amounts. The liver makes all the cholesterol you need. Cholesterol is carried from the liver by the blood through the blood vessels. Deposits of cholesterol (plaque) may build up on blood vessel walls. These make the arteries narrower and stiffer. Cholesterol plaques increase the risk for heart attack and stroke.  You cannot feel your cholesterol level even if it is very high. The only way to know it is high is with a blood test. Once you know your cholesterol levels, you should keep a record of the test results. Work with your health care provider to keep your levels in the desired range.  WHAT DO THE  RESULTS MEAN?  Total cholesterol is a rough measure of all the cholesterol in your blood.   LDL is the so-called bad cholesterol. This is the type that deposits cholesterol in the walls of the arteries. You want this level to be low.   HDL is the good cholesterol because it cleans the arteries and carries the LDL away. You want this level to be high.  Triglycerides are fat that the body can either burn for energy or store. High levels are closely linked to heart disease.  WHAT ARE THE DESIRED LEVELS OF CHOLESTEROL?  Total cholesterol below 200.   LDL below 100 for people at risk, below 70 for those at very high risk.   HDL above 50 is good, above 60 is best.   Triglycerides below 150.  HOW CAN I LOWER MY CHOLESTEROL?  Diet. Follow your diet programs as directed by your health care provider.   Choose fish or white meat chicken and [place], roasted or baked. Limit fatty cuts of red meat, fried foods, and processed meats, such as sausage and lunch meats.   Eat lots of fresh fruits and vegetables.  Choose whole grains, beans, pasta, potatoes, and cereals.   Use only small amounts of olive, corn, or canola oils.   Avoid butter, mayonnaise, shortening, or palm kernel oils.  Avoid foods with trans fats.   Drink skim or nonfat milk and eat low-fat or nonfat yogurt and cheeses. Avoid whole milk, cream, ice cream, egg yolks, and full-fat cheeses.   Healthy desserts include angel  food cake, ginger snaps, animal crackers, hard candy, popsicles, and low-fat or nonfat frozen yogurt. Avoid pastries, cakes, pies, and cookies.   Exercise. Follow your exercise programs as directed by your health care provider.   A regular program helps decrease LDL and raise HDL.   A regular program helps with weight control.   Do things that increase your activity level like gardening, walking, or taking the stairs. Ask your health care provider about how you can be more active in your  daily life.   Medicine. Take medicine only as directed by your health care provider.   Medicine may be prescribed by your health care provider to help lower cholesterol and decrease the risk for heart disease.   If you have several risk factors, you may need medicine even if your levels are normal.   This information is not intended to replace advice given to you by your health care provider. Make sure you discuss any questions you have with your health care provider.   Document Released: 03/11/2001 Document Revised: 07/07/2014 Document Reviewed: 03/30/2013 Elsevier Interactive Patient Education [employer]

## 2016-04-13 ENCOUNTER — Encounter (HOSPITAL_COMMUNITY): Payer: Self-pay | Admitting: Emergency Medicine

## 2016-04-13 ENCOUNTER — Observation Stay (HOSPITAL_COMMUNITY)
Admission: EM | Admit: 2016-04-13 | Discharge: 2016-04-15 | Disposition: A | Discharge status: Home / Self Care | Payer: Medicaid Other | Attending: Family Medicine | Admitting: Family Medicine

## 2016-04-13 ENCOUNTER — Other Ambulatory Visit: Payer: Self-pay

## 2016-04-13 ENCOUNTER — Emergency Department (HOSPITAL_COMMUNITY): Payer: Medicaid Other

## 2016-04-13 DIAGNOSIS — R55 Syncope and collapse: Principal | ICD-10-CM | POA: Insufficient documentation

## 2016-04-13 DIAGNOSIS — Z951 Presence of aortocoronary bypass graft: Secondary | ICD-10-CM | POA: Diagnosis not present

## 2016-04-13 DIAGNOSIS — Z8673 Personal history of transient ischemic attack (TIA), and cerebral infarction without residual deficits: Secondary | ICD-10-CM | POA: Diagnosis not present

## 2016-04-13 DIAGNOSIS — R911 Solitary pulmonary nodule: Secondary | ICD-10-CM

## 2016-04-13 DIAGNOSIS — I69359 Hemiplegia and hemiparesis following cerebral infarction affecting unspecified side: Secondary | ICD-10-CM | POA: Diagnosis not present

## 2016-04-13 DIAGNOSIS — I25810 Atherosclerosis of coronary artery bypass graft(s) without angina pectoris: Secondary | ICD-10-CM | POA: Diagnosis not present

## 2016-04-13 DIAGNOSIS — R0789 Other chest pain: Secondary | ICD-10-CM | POA: Insufficient documentation

## 2016-04-13 DIAGNOSIS — E860 Dehydration: Secondary | ICD-10-CM | POA: Diagnosis not present

## 2016-04-13 DIAGNOSIS — Z79899 Other long term (current) drug therapy: Secondary | ICD-10-CM | POA: Diagnosis not present

## 2016-04-13 DIAGNOSIS — I959 Hypotension, unspecified: Secondary | ICD-10-CM | POA: Diagnosis present

## 2016-04-13 DIAGNOSIS — I252 Old myocardial infarction: Secondary | ICD-10-CM | POA: Insufficient documentation

## 2016-04-13 DIAGNOSIS — K219 Gastro-esophageal reflux disease without esophagitis: Secondary | ICD-10-CM

## 2016-04-13 DIAGNOSIS — F17210 Nicotine dependence, cigarettes, uncomplicated: Secondary | ICD-10-CM | POA: Diagnosis not present

## 2016-04-13 DIAGNOSIS — Z7982 Long term (current) use of aspirin: Secondary | ICD-10-CM | POA: Insufficient documentation

## 2016-04-13 DIAGNOSIS — F419 Anxiety disorder, unspecified: Secondary | ICD-10-CM

## 2016-04-13 DIAGNOSIS — I714 Abdominal aortic aneurysm, without rupture, unspecified: Secondary | ICD-10-CM | POA: Diagnosis present

## 2016-04-13 DIAGNOSIS — E785 Hyperlipidemia, unspecified: Secondary | ICD-10-CM

## 2016-04-13 DIAGNOSIS — I739 Peripheral vascular disease, unspecified: Secondary | ICD-10-CM | POA: Diagnosis present

## 2016-04-13 DIAGNOSIS — I10 Essential (primary) hypertension: Secondary | ICD-10-CM | POA: Insufficient documentation

## 2016-04-13 DIAGNOSIS — I2510 Atherosclerotic heart disease of native coronary artery without angina pectoris: Secondary | ICD-10-CM | POA: Diagnosis not present

## 2016-04-13 DIAGNOSIS — Z7902 Long term (current) use of antithrombotics/antiplatelets: Secondary | ICD-10-CM | POA: Insufficient documentation

## 2016-04-13 DIAGNOSIS — F411 Generalized anxiety disorder: Secondary | ICD-10-CM | POA: Diagnosis present

## 2016-04-13 HISTORY — DX: Cerebral infarction, unspecified: I63.9

## 2016-04-13 LAB — COMPREHENSIVE METABOLIC PANEL
ALBUMIN: 3.5 g/dL (ref 3.5–5.0)
ALK PHOS: 69 U/L (ref 38–126)
ALT: 23 U/L (ref 17–63)
ANION GAP: 8 (ref 5–15)
AST: 21 U/L (ref 15–41)
BUN: 15 mg/dL (ref 6–20)
CALCIUM: 8.7 mg/dL — AB (ref 8.9–10.3)
CO2: 26 mmol/L (ref 22–32)
Chloride: 101 mmol/L (ref 101–111)
Creatinine, Ser: 1.17 mg/dL (ref 0.61–1.24)
GFR calc Af Amer: >60 mL/min (ref >60)
GFR calc non Af Amer: >60 mL/min (ref >60)
GLUCOSE: 82 mg/dL (ref 65–99)
Potassium: 3.6 mmol/L (ref 3.5–5.1)
SODIUM: 135 mmol/L (ref 135–145)
Total Bilirubin: 0.9 mg/dL (ref 0.3–1.2)
Total Protein: 5.9 g/dL — ABNORMAL LOW (ref 6.5–8.1)

## 2016-04-13 LAB — CBC WITH DIFFERENTIAL/PLATELET
BASOS ABS: 0 10*3/uL (ref 0.0–0.1)
BASOS PCT: 0 %
EOS ABS: 0.4 10*3/uL (ref 0.0–0.7)
Eosinophils Relative: 4 %
HCT: 41.6 % (ref 39.0–52.0)
HEMOGLOBIN: 13.8 g/dL (ref 13.0–17.0)
Lymphocytes Relative: 23 %
Lymphs Abs: 2 10*3/uL (ref 0.7–4.0)
MCH: 28.4 pg (ref 26.0–34.0)
MCHC: 33.2 g/dL (ref 30.0–36.0)
MCV: 85.6 fL (ref 78.0–100.0)
Monocytes Absolute: 0.5 10*3/uL (ref 0.1–1.0)
Monocytes Relative: 6 %
NEUTROS PCT: 67 %
Neutro Abs: 5.6 10*3/uL (ref 1.7–7.7)
Platelets: 163 10*3/uL (ref 150–400)
RBC: 4.86 MIL/uL (ref 4.22–5.81)
RDW: 13.8 % (ref 11.5–15.5)
WBC: 8.5 10*3/uL (ref 4.0–10.5)

## 2016-04-13 LAB — I-STAT TROPONIN, ED: Troponin i, poc: 0.02 ng/mL (ref 0.00–0.08)

## 2016-04-13 LAB — ETHANOL: ALCOHOL ETHYL (B): 71 mg/dL — AB (ref <5)

## 2016-04-13 MED ORDER — SODIUM CHLORIDE 0.9 % IV BOLUS (SEPSIS)
1000.0000 mL | Freq: Once | INTRAVENOUS | Status: AC
  Administered 2016-04-13: 1000 mL via INTRAVENOUS

## 2016-04-13 NOTE — ED Provider Notes (Signed)
[facility]-EMERGENCY DEPT Provider Note   CSN: [id number] Arrival date & time: 04/13/16  1809     History   Chief Complaint Chief Complaint  Patient presents with  . Loss of Consciousness  . Chest Pain    HPI Randy [name] is a 55 y.o. male.  Randy [name] is a 55 year old gentleman with PMH CAD (s/p CABG), PVD, arrhythmia, CVA with mild residual right hemiparesis, AAA, GERD who presents for an episode of syncope at home with chest pain and dizziness. The patient reports that he forgot to take all his medications this morning, remembered at the family cookout after drinking 4 beers, and took them all this afternoon. He was sitting in a chair and had a coughing fit that resulted in him "passing out." After he regained consciousness he felt very dizzy and nauseous and vomited once. He has intermittent chest at baseline but this intermittent left sided pressure-like pain is stronger than usual. He arrived to the ED vis EMS and received ASA 325mg , SL nitro x1, and 1L IVF PTA. He reports some relief in pain from the nitro but it is still present with ongoing mild nausea and dizziness. He did not received Zofran due to prolonged QT present on EMS EKG. Patient denies recent shortness of breath, diaphoresis, fevers/chills, cough, abdominal pain.    The history is provided by the patient.    Past Medical History:  Diagnosis Date  . AAA (abdominal aortic aneurysm) [facility]) July 2013  . Abnormal heart rhythm   . Anxiety   . CAD (coronary artery disease)   . Carotid artery occlusion   . GERD (gastroesophageal reflux disease)   . Heart attack 2010  . Hx of CABG 2010  . Hyperlipidemia   . Hypertension   . Irregular heartbeat   . Lung nodule   . Polysubstance abuse   . PVD (peripheral vascular disease) (HCC)   . Shortness of breath    when lying flat on back  . Stroke [facility])     Patient Active Problem List   Diagnosis Date Noted  . Syncope 04/14/2016  . Hypotension 04/13/2016  . Insomnia  04/11/2016  . Orthopnea 03/31/2016  . 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 HLD (hyperlipidemia) 04/05/2014  . PVD (peripheral vascular disease) with claudication (HCC) 05/09/2013  . GERD (gastroesophageal reflux disease) 03/28/2013  . AAA (abdominal aortic aneurysm) without rupture (HCC) 10/28/2012  . Anxiety 02/12/2012  . Uncontrolled hypertension 02/02/2012  . Occlusion and stenosis of carotid artery without mention of cerebral infarction 01/19/2012  . Pulmonary nodule 05/19/2011  . CAD, ARTERY BYPASS GRAFT 10/19/2008  . DISC DISEASE, LUMBAR 10/19/2008    Past Surgical History:  Procedure Laterality Date  . ABDOMINAL AORTAGRAM N/A 04/19/2013   Procedure: ABDOMINAL AORTAGRAM;  Surgeon: Nada Libman, MD;  Location: [facility] CATH LAB;  Service: Cardiovascular;  Laterality: N/A;  . CAROTID ENDARTERECTOMY Left 01-13-13   Attempted cea  . CAROTID STENT INSERTION Left 02/22/2013   Procedure: CAROTID STENT INSERTION;  Surgeon: Nada Libman, MD;  Location: [facility] CATH LAB;  Service: Cardiovascular;  Laterality: Left;  . CORONARY ARTERY BYPASS GRAFT  2010  . ENDARTERECTOMY Left 01/13/2013   Procedure: ATTEMPTED ENDARTERECTOMY CAROTID;  Surgeon: Nada Libman, MD;  Location: [facility] OR;  Service: Vascular;  Laterality: Left;       Home Medications    Prior to Admission medications   Medication Sig Start Date End Date Taking? Authorizing Provider  aspirin EC 81  MG tablet Take 1 tablet (81 mg total) by mouth daily. 03/31/16  Yes Howard Pouch, MD  atorvastatin (LIPITOR) 80 MG tablet Take 1 tablet (80 mg total) by mouth daily. 03/31/16  Yes Howard Pouch, MD  carvedilol (COREG) 12.5 MG tablet Take 1 tablet (12.5 mg total) by mouth 2 (two) times daily with a meal. 03/31/16  Yes Howard Pouch, MD  clopidogrel (PLAVIX) 75 MG tablet Take 1 tablet (75 mg total) by mouth daily with breakfast. 03/31/16  Yes Howard Pouch, MD  cyclobenzaprine (FLEXERIL) 10 MG tablet Take 1  tablet (10 mg total) by mouth 3 (three) times daily as needed for muscle spasms. 03/31/16  Yes Howard Pouch, MD  lisinopril (PRINIVIL,ZESTRIL) 40 MG tablet Take 1 tablet (40 mg total) by mouth daily. 03/31/16  Yes Howard Pouch, MD  methocarbamol (ROBAXIN) 750 MG tablet Take 2 tablets (1,500 mg total) by mouth every 6 (six) hours as needed for muscle spasms. 09/08/14  Yes Tommie Sams, DO  ranitidine (ZANTAC) 300 MG tablet Take 1 tablet (300 mg total) by mouth daily. 03/31/16  Yes Howard Pouch, MD  traMADol (ULTRAM) 50 MG tablet Take 1-2 tablets (50-100 mg total) by mouth every 8 (eight) hours as needed. 09/08/14  Yes Tommie Sams, DO  traZODone (DESYREL) 150 MG tablet Take 1 tablet (150 mg total) by mouth at bedtime. 07/19/14  Yes Tyrone Nine, MD    Family History Family History  Problem Relation Age of Onset  . Asthma Mother   . Diabetes Mother   . Hyperlipidemia Mother   . Hypertension Mother   . Lung cancer Father   . Cancer Father   . Stroke Sister     Social History Social History  Substance Use Topics  . Smoking status: Former Smoker    Packs/day: 0.25    Years: 30.00    Types: Cigarettes    Quit date: 01/13/2013  . Smokeless tobacco: Never Used  . Alcohol use Yes     Comment: occasionally     Allergies   Review of patient's allergies indicates no known allergies.   Review of Systems Review of Systems  Constitutional: Negative for chills, diaphoresis, fatigue and fever.  Respiratory: Negative for cough, chest tightness, shortness of breath and wheezing.   Cardiovascular: Positive for chest pain. Negative for palpitations and leg swelling.  Gastrointestinal: Negative for abdominal pain, constipation, diarrhea and nausea.  Neurological: Positive for dizziness, syncope, facial asymmetry (mild R facial droop, baseline) and light-headedness. Negative for speech difficulty, weakness and numbness.  All other systems reviewed and are negative.    Physical Exam Updated Vital  Signs BP 147/88   Pulse 72   Temp 97.6 F (36.4 C) (Oral)   Resp 17   SpO2 96%   Physical Exam  Constitutional: He is oriented to person, place, and time. He appears well-developed and well-nourished. No distress.  Appears older than stated age  HENT:  Head: Normocephalic and atraumatic.  No bite mark on tongue  Eyes: EOM are normal. Pupils are equal, round, and reactive to light.  Neck: Normal range of motion. Neck supple.  Cardiovascular: Normal rate, regular rhythm, normal heart sounds and intact distal pulses.  Exam reveals no gallop and no friction rub.   No murmur heard. Sternotomy scar  Pulmonary/Chest: Effort normal and breath sounds normal. No respiratory distress. He has no wheezes. He has no rales. He exhibits tenderness (mild TTP over left chest wall).  Abdominal: Soft. Bowel sounds are normal. He exhibits no  distension. There is no tenderness. There is no guarding.  Musculoskeletal: Normal range of motion. He exhibits no edema, tenderness or deformity.  Neurological: He is alert and oriented to person, place, and time. No cranial nerve deficit.  Mild decreased grip R hand, chronic  Skin: Skin is warm and dry. Capillary refill takes less than 2 seconds. He is not diaphoretic.  Psychiatric: He has a normal mood and affect. His behavior is normal. Thought content normal.  Nursing note and vitals reviewed.    ED Treatments / Results  Labs (all labs ordered are listed, but only abnormal results are displayed) Labs Reviewed  COMPREHENSIVE METABOLIC PANEL - Abnormal; Notable for the following:       Result Value   Calcium 8.7 (*)    Total Protein 5.9 (*)    All other components within normal limits  ETHANOL - Abnormal; Notable for the following:    Alcohol, Ethyl (B) 71 (*)    All other components within normal limits  CBC WITH DIFFERENTIAL/PLATELET  HEMOGLOBIN A1C  TROPONIN I  TROPONIN I  TROPONIN I  I-STAT TROPOININ, ED    EKG  EKG  Interpretation  Date/Time:  Sunday April 13 2016 18:19:34 EDT Ventricular Rate:  69 PR Interval:    QRS Duration: 111 QT Interval:  414 QTC Calculation: 444 R Axis:   54 Text Interpretation:  Sinus rhythm Nonspecific T abnormalities, lateral leads nonspecific TWI new since previous  Confirmed by YAO  MD, DAVID ([id number]) on 04/13/2016 7:14:22 PM       Radiology Dg Chest 2 View  Result Date: 04/13/2016 CLINICAL DATA:  Intermittent chest pain for 6 months. Dizziness, shortness of breath, coughing EXAM: CHEST  2 VIEW COMPARISON:  10/22/2013 FINDINGS: There is bilateral interstitial prominence. There is no focal parenchymal opacity. There is no pleural effusion or pneumothorax. The heart and mediastinal contours are unremarkable. There is evidence of prior CABG. The osseous structures are unremarkable. IMPRESSION: No active cardiopulmonary disease. Electronically Signed   By: Elige Ko   On: 04/13/2016 19:20    Procedures Procedures (including critical care time)  Medications Ordered in ED Medications  traZODone (DESYREL) tablet 150 mg (not administered)  sodium chloride 0.9 % bolus 1,000 mL (0 mLs Intravenous Stopped 04/13/16 2030)  sodium chloride 0.9 % bolus 1,000 mL (1,000 mLs Intravenous New Bag/Given 04/13/16 2100)     Initial Impression / Assessment and Plan / ED Course  I have reviewed the triage vital signs and the nursing notes.  Pertinent labs & imaging results that were available during my care of the patient were reviewed by me and considered in my medical decision making (see chart for details).  Clinical Course   Mr. Tavano is 55 yo gentleman with significant cardiac/vascular history who presents following an episode of syncope at home and ongoing chest pain, hypotension, dizziness, nausea. No new neuro findings, chest mildly TTP. EKG with nonspecific T wave changes in lateral leads. Had some chest pain relief with dose of nitro. Will obtain CXR, troponin, CMP,  CBC, ethanol, orthostatic vital. Will provide 1L IVF bolus.  No acute findings on CXR, initial labs remarkable for troponin 0.02 and elevated EtOH to 71. Considering ACS vs acute hypotension from taking all medications after drinking alcohol. Admit for chest pain r/o.  Final Clinical Impressions(s) / ED Diagnoses   Final diagnoses:  Syncope and collapse  Dehydration    New Prescriptions New Prescriptions   No medications on file     Althia Forts, MD  04/14/16 0102    Charlynne Pander, MD 04/14/16 2034

## 2016-04-13 NOTE — H&P (Signed)
Family Medicine Teaching [facility] Admission History and Physical Service Pager: [phone]  Patient name: Randy PalmerKeith [name] Medical record number: [id number] Date of birth: 12/13/1960 Age: 55 y.o. Gender: male  Primary Care Provider: Howard PouchLauren Feng, MD Consultants: None Code Status: Full Code  Chief Complaint:  Syncope, chest pain  Assessment and Plan: Randy [name] is a 55 y.o. male presenting with syncope and chest pain . PMH is significant for CAD status post CABG, AAA, CVA  Syncope:  Syncopal episode in the setting of taking antihypertensives in conjunctino with alcohol use and persistent coughing. Hypotensive on admission down to 89 systolic, normally hypertensive to the 140s-50s.  Reportedly had provoked syncopal episode last week also from coughing.  Denies any orthostatic symptoms, or lightheadedness with bowel movements.  EKG showed nonspecific T-wave inversions in the lateral leads change from last EKG.  First troponin negative. Hemoglobin within normal limits. Likely secondary to vasovagal stimulation induced by coughing. No focal neurological deficits. S/p 2L NS bolus in ED.  - Place [facility] - Vitals per floor protocol - AM CBC/BMP - Repeat echo (apparently done 10/2, results not available) - Carotid Dopplers - Consult cards  Chest pain:  Chronic in nature in the left chest, described as pressure. Received nitroglycerin en route to ED, with some relief.  Takes plavix daily. Significant CAD history including CABG, CVA and stable AAA, and PVD with claudication.  EKG showed nonspecific T-wave inversions in the lateral leads changed since last EKG and first troponin is negative. HEART score 6. iStat troponin 0.02.  - Cycle troponins - Cardiac monitoring - Repeat EKG in AM - Follow-up echo and carotid Dopplers - Consult cards as above  AAA:  Greater than 4 cm. Abdominal ultrasound last week showed stable size from last study in July. - Continue  outpatient monitoring  CAD:  CABG in 2010. Poorly controlled hypertension, last A1c in 2015 was less than 6. Patient is a former smoker. Complaint of chest pressure the left chest that was relieved with nitroglycerin.  - Continue Lipitor 80 mg daily - Continue Plavix 75 mg - Trend troponins - Repeat hemoglobin A1c - Chest pain management as above  Hyperlipidemia: Lipid Panel 10/2 Cholesterol 264, Triglycerides 168, HDL 40, LDL 190. - Continue lipitor 80mg    Hypertension: Takes coreg and lisinopril at home.  Hypotensive on admission to 89/59. Systolic blood pressure now in the 160s.  - Continue Coreg 12.5 mg twice a day and lisinopril 40 mg daily  Polysubstance abuse:  Denies illicit drugs, does drink alcohol. Had 4 beers today immediately prior to incident. Typical daily use unclear. Ethanol level elevated at 71 on admission.  - CIWA protocol  FEN/GI: Maintenance IV fluid/ heart healthy diet Prophylaxis: Plavix/PPI  Disposition: admit to tele for obs under attending Fletke  History of Present Illness:  Randy [name] is a 55 y.o. male presenting with syncope and chest pain  Patient reports that he forgot to take his medications this morning, then took them all at once after drinking about four beers at a family cook out. He subsequently "blacked out." Patient says immediately prior to blacking out he began coughing. Patient was sitting in a chair when the incident occurred. His son-in-law reports that when he looked over at the patient, he was blankly staring straight ahead with his eyes partially closed. Son-in-law and patient's daughter tried to arouse patient both verbally and by touching his arms, however patient was not responsive. After about five minutes patient became responsive again, but  immediately began to vomit. No blood in vomit.  He reportedly had chest pain when came to. Has chronic chest pain on L side and this feels similar. Describes it as pressure. Patient noted that he  also passed out a couple weeks ago after coughing fit. Never passed out when having BM. Not lightheadedness with standing.    Was brought to [facility] ED via ambulance and CP improved with nitro x2 and ASA 325mg  en route.  Chest pain also improved with resting and laying down in ambulance. Had resolved by time arrived in ED. Denies chest pain currently. Was hypotensive to 103/80 and then 89/59 upon admission and received 2 boluses in ED.   Review Of Systems: Per HPI with the following additions: Denies SOB, fevers, productive cough.   ROS  Patient Active Problem List   Diagnosis Date Noted  . Syncope 04/14/2016  . Hypotension 04/13/2016  . Insomnia 04/11/2016  . Orthopnea 03/31/2016  . 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 HLD (hyperlipidemia) 04/05/2014  . PVD (peripheral vascular disease) with claudication (HCC) 05/09/2013  . GERD (gastroesophageal reflux disease) 03/28/2013  . AAA (abdominal aortic aneurysm) without rupture (HCC) 10/28/2012  . Anxiety 02/12/2012  . Uncontrolled hypertension 02/02/2012  . Occlusion and stenosis of carotid artery without mention of cerebral infarction 01/19/2012  . Pulmonary nodule 05/19/2011  . CAD, ARTERY BYPASS GRAFT 10/19/2008  . DISC DISEASE, LUMBAR 10/19/2008    Past Medical History: Past Medical History:  Diagnosis Date  . AAA (abdominal aortic aneurysm) [facility]) July 2013  . Abnormal heart rhythm   . Anxiety   . CAD (coronary artery disease)   . Carotid artery occlusion   . GERD (gastroesophageal reflux disease)   . Heart attack 2010  . Hx of CABG 2010  . Hyperlipidemia   . Hypertension   . Irregular heartbeat   . Lung nodule   . Polysubstance abuse   . PVD (peripheral vascular disease) (HCC)   . Shortness of breath    when lying flat on back  . Stroke [facility])     Past Surgical History: Past Surgical History:  Procedure Laterality Date  . ABDOMINAL AORTAGRAM N/A 04/19/2013   Procedure:  ABDOMINAL AORTAGRAM;  Surgeon: Nada Libman, MD;  Location[facility] CATH LAB;  Service: Cardiovascular;  Laterality: N/A;  . CAROTID ENDARTERECTOMY Left 01-13-13   Attempted cea  . CAROTID STENT INSERTION Left 02/22/2013   Procedure: CAROTID STENT INSERTION;  Surgeon: Nada Libman, MD;  Location: [facility] CATH LAB;  Service: Cardiovascular;  Laterality: Left;  . CORONARY ARTERY BYPASS GRAFT  2010  . ENDARTERECTOMY Left 01/13/2013   Procedure: ATTEMPTED ENDARTERECTOMY CAROTID;  Surgeon: Nada Libman, MD;  Location: [facility] OR;  Service: Vascular;  Laterality: Left;    Social History: Social History  Substance Use Topics  . Smoking status: Former Smoker    Packs/day: 0.25    Years: 30.00    Types: Cigarettes    Quit date: 01/13/2013  . Smokeless tobacco: Never Used  . Alcohol use Yes     Comment: occasionally    Family History: Family History  Problem Relation Age of Onset  . Asthma Mother   . Diabetes Mother   . Hyperlipidemia Mother   . Hypertension Mother   . Lung cancer Father   . Cancer Father   . Stroke Sister     Allergies and Medications: No Known Allergies No current facility-administered medications on file prior to encounter.    Current  Outpatient Prescriptions on File Prior to Encounter  Medication Sig Dispense Refill  . aspirin EC 81 MG tablet Take 1 tablet (81 mg total) by mouth daily. 30 tablet 2  . atorvastatin (LIPITOR) 80 MG tablet Take 1 tablet (80 mg total) by mouth daily. 90 tablet 0  . carvedilol (COREG) 12.5 MG tablet Take 1 tablet (12.5 mg total) by mouth 2 (two) times daily with a meal. 90 tablet 0  . clopidogrel (PLAVIX) 75 MG tablet Take 1 tablet (75 mg total) by mouth daily with breakfast. 30 tablet 0  . cyclobenzaprine (FLEXERIL) 10 MG tablet Take 1 tablet (10 mg total) by mouth 3 (three) times daily as needed for muscle spasms. 30 tablet 3  . lisinopril (PRINIVIL,ZESTRIL) 40 MG tablet Take 1 tablet (40 mg total) by mouth daily. 60 tablet 2  .  methocarbamol (ROBAXIN) 750 MG tablet Take 2 tablets (1,500 mg total) by mouth every 6 (six) hours as needed for muscle spasms. 240 tablet 0  . ranitidine (ZANTAC) 300 MG tablet Take 1 tablet (300 mg total) by mouth daily. 60 tablet 0  . traMADol (ULTRAM) 50 MG tablet Take 1-2 tablets (50-100 mg total) by mouth every 8 (eight) hours as needed. 30 tablet 0  . traZODone (DESYREL) 150 MG tablet Take 1 tablet (150 mg total) by mouth at bedtime. 30 tablet 1    Objective: BP 147/88   Pulse 72   Temp 97.6 F (36.4 C) (Oral)   Resp 17   SpO2 96%  Exam: General: 55 year old man sitting up in bed, appears comfortable Eyes: EOMI, PERRL, non-injected ENTM: MMM Neck: supple Cardiovascular: Regular rate and rhythm, S1-S2 present, no murmurs Respiratory: Clear to auscultation bilaterally, normal work of breathing, no wheezing or rhonchi Gastrointestinal: Soft, nontender, nondistended MSK: Moves all extremities, no LE edema Derm: Warm and dry Neuro: Alert and oriented 3, no focal neurological deficits Psych: Normal mood and affect  Labs and Imaging: CBC BMET   Recent Labs Lab 04/13/16 1920  WBC 8.5  HGB 13.8  HCT 41.6  PLT 163    Recent Labs Lab 04/13/16 1920  NA 135  K 3.6  CL 101  CO2 26  BUN 15  CREATININE 1.17  GLUCOSE 82  CALCIUM 8.7*      Maecie Sevcik Shelbie Hutching, MD 04/14/2016, 1:35 AM PGY-1,  Family Medicine FPTS Intern pager: [phone], text pages welcome  UPPER LEVEL ADDENDUM  I have read the above note and made revisions highlighted in orange.  Tarri Abernethy, MD, MPH PGY-2 [facility] Pager [phone]

## 2016-04-13 NOTE — ED Notes (Signed)
Patient transported to X-ray 

## 2016-04-13 NOTE — ED Triage Notes (Addendum)
Pt arrives via [facility], ems reports pt was sitting in a chair when he had an episode where family reports he "spaced out" for 1-2 minutes, no falls associated with episode. Pt had 1 episode of vomiting prior to ems arrival, no zofran given en route due to prolonged QT on ekg. Pt reported chest pain and dizziness with ems. Received 324mg  asa, 1sl nitro and 1 L of ns pta. Ems reports pt took his medications this afternoon and had about 4 beers shortly after. Pt a/ox4, hx of CVA, ems reports residual weakness on right side and right facial droop that family reported was baseline. CBG 96.

## 2016-04-14 ENCOUNTER — Observation Stay (HOSPITAL_BASED_OUTPATIENT_CLINIC_OR_DEPARTMENT_OTHER): Payer: Medicaid Other

## 2016-04-14 DIAGNOSIS — R9431 Abnormal electrocardiogram [ECG] [EKG]: Secondary | ICD-10-CM | POA: Diagnosis not present

## 2016-04-14 DIAGNOSIS — R55 Syncope and collapse: Secondary | ICD-10-CM

## 2016-04-14 DIAGNOSIS — E782 Mixed hyperlipidemia: Secondary | ICD-10-CM

## 2016-04-14 DIAGNOSIS — I739 Peripheral vascular disease, unspecified: Secondary | ICD-10-CM

## 2016-04-14 DIAGNOSIS — I714 Abdominal aortic aneurysm, without rupture: Secondary | ICD-10-CM | POA: Diagnosis not present

## 2016-04-14 DIAGNOSIS — I6521 Occlusion and stenosis of right carotid artery: Secondary | ICD-10-CM | POA: Diagnosis not present

## 2016-04-14 DIAGNOSIS — I25709 Atherosclerosis of coronary artery bypass graft(s), unspecified, with unspecified angina pectoris: Secondary | ICD-10-CM | POA: Diagnosis not present

## 2016-04-14 DIAGNOSIS — E860 Dehydration: Secondary | ICD-10-CM | POA: Diagnosis not present

## 2016-04-14 DIAGNOSIS — I959 Hypotension, unspecified: Secondary | ICD-10-CM | POA: Diagnosis not present

## 2016-04-14 LAB — BASIC METABOLIC PANEL
ANION GAP: 5 (ref 5–15)
BUN: 14 mg/dL (ref 6–20)
CALCIUM: 8.1 mg/dL — AB (ref 8.9–10.3)
CO2: 24 mmol/L (ref 22–32)
CREATININE: 1.06 mg/dL (ref 0.61–1.24)
Chloride: 108 mmol/L (ref 101–111)
Glucose, Bld: 93 mg/dL (ref 65–99)
Potassium: 4 mmol/L (ref 3.5–5.1)
SODIUM: 137 mmol/L (ref 135–145)

## 2016-04-14 LAB — CBC
HCT: 42 % (ref 39.0–52.0)
HEMOGLOBIN: 13.9 g/dL (ref 13.0–17.0)
MCH: 28.5 pg (ref 26.0–34.0)
MCHC: 33.1 g/dL (ref 30.0–36.0)
MCV: 86.1 fL (ref 78.0–100.0)
PLATELETS: 153 10*3/uL (ref 150–400)
RBC: 4.88 MIL/uL (ref 4.22–5.81)
RDW: 14.1 % (ref 11.5–15.5)
WBC: 5.6 10*3/uL (ref 4.0–10.5)

## 2016-04-14 LAB — TROPONIN I
TROPONIN I: 0.05 ng/mL — AB (ref <0.03)
TROPONIN I: 0.05 ng/mL — AB (ref <0.03)
Troponin I: 0.05 ng/mL (ref <0.03)

## 2016-04-14 LAB — ECHOCARDIOGRAM COMPLETE
HEIGHTINCHES: 70.5 in
WEIGHTICAEL: 2832 [oz_av]

## 2016-04-14 LAB — BRAIN NATRIURETIC PEPTIDE: B NATRIURETIC PEPTIDE 5: 210.7 pg/mL — AB (ref 0.0–100.0)

## 2016-04-14 LAB — TSH: TSH: 1.292 u[IU]/mL (ref 0.350–4.500)

## 2016-04-14 MED ORDER — ADULT MULTIVITAMIN W/MINERALS CH
1.0000 | ORAL_TABLET | Freq: Every day | ORAL | Status: DC
Start: 2016-04-14 — End: 2016-04-15
  Administered 2016-04-14 – 2016-04-15 (×2): 1 via ORAL
  Filled 2016-04-14 (×2): qty 1

## 2016-04-14 MED ORDER — ASPIRIN EC 81 MG PO TBEC
81.0000 mg | DELAYED_RELEASE_TABLET | Freq: Every day | ORAL | Status: DC
  Administered 2016-04-14 – 2016-04-15 (×2): 81 mg via ORAL
  Filled 2016-04-14 (×2): qty 1

## 2016-04-14 MED ORDER — SODIUM CHLORIDE 0.9 % IV SOLN
INTRAVENOUS | Status: AC
  Administered 2016-04-14: 06:00:00 via INTRAVENOUS

## 2016-04-14 MED ORDER — LISINOPRIL 40 MG PO TABS
40.0000 mg | ORAL_TABLET | Freq: Every day | ORAL | Status: DC
  Administered 2016-04-14 – 2016-04-15 (×2): 40 mg via ORAL
  Filled 2016-04-14: qty 2
  Filled 2016-04-14: qty 1

## 2016-04-14 MED ORDER — FAMOTIDINE 20 MG PO TABS
40.0000 mg | ORAL_TABLET | Freq: Every day | ORAL | Status: DC
  Administered 2016-04-14: 40 mg via ORAL
  Filled 2016-04-14: qty 2

## 2016-04-14 MED ORDER — LORAZEPAM 2 MG/ML IJ SOLN: 1.0000 mg | Freq: Four times a day (QID) | INTRAMUSCULAR | Status: DC | PRN

## 2016-04-14 MED ORDER — VITAMIN B-1 100 MG PO TABS
100.0000 mg | ORAL_TABLET | Freq: Every day | ORAL | Status: DC
  Administered 2016-04-14 – 2016-04-15 (×2): 100 mg via ORAL
  Filled 2016-04-14 (×2): qty 1

## 2016-04-14 MED ORDER — FOLIC ACID 1 MG PO TABS
1.0000 mg | ORAL_TABLET | Freq: Every day | ORAL | Status: DC
  Administered 2016-04-14 – 2016-04-15 (×2): 1 mg via ORAL
  Filled 2016-04-14 (×2): qty 1

## 2016-04-14 MED ORDER — LORAZEPAM 1 MG PO TABS: 1.0000 mg | ORAL_TABLET | Freq: Four times a day (QID) | ORAL | Status: DC | PRN

## 2016-04-14 MED ORDER — TRAZODONE HCL 50 MG PO TABS
150.0000 mg | ORAL_TABLET | Freq: Every day | ORAL | Status: DC
  Administered 2016-04-14: 150 mg via ORAL
  Filled 2016-04-14: qty 1

## 2016-04-14 MED ORDER — SODIUM CHLORIDE 0.9% FLUSH
3.0000 mL | Freq: Two times a day (BID) | INTRAVENOUS | Status: DC
  Administered 2016-04-14 – 2016-04-15 (×3): 3 mL via INTRAVENOUS

## 2016-04-14 MED ORDER — ENOXAPARIN SODIUM 40 MG/0.4ML ~~LOC~~ SOLN
40.0000 mg | Freq: Every day | SUBCUTANEOUS | Status: DC
  Administered 2016-04-14 – 2016-04-15 (×2): 40 mg via SUBCUTANEOUS
  Filled 2016-04-14 (×3): qty 0.4

## 2016-04-14 MED ORDER — ONDANSETRON HCL 4 MG/2ML IJ SOLN: 4.0000 mg | Freq: Four times a day (QID) | INTRAMUSCULAR | Status: DC | PRN

## 2016-04-14 MED ORDER — ONDANSETRON HCL 4 MG PO TABS: 4.0000 mg | ORAL_TABLET | Freq: Four times a day (QID) | ORAL | Status: DC | PRN

## 2016-04-14 MED ORDER — CARVEDILOL 12.5 MG PO TABS
12.5000 mg | ORAL_TABLET | Freq: Two times a day (BID) | ORAL | Status: DC
Start: 2016-04-14 — End: 2016-04-15
  Administered 2016-04-14 – 2016-04-15 (×3): 12.5 mg via ORAL
  Filled 2016-04-14 (×3): qty 1

## 2016-04-14 MED ORDER — THIAMINE HCL 100 MG/ML IJ SOLN: 100.0000 mg | Freq: Every day | INTRAMUSCULAR | Status: DC

## 2016-04-14 MED ORDER — CLOPIDOGREL BISULFATE 75 MG PO TABS
75.0000 mg | ORAL_TABLET | Freq: Every day | ORAL | Status: DC
  Administered 2016-04-14 – 2016-04-15 (×2): 75 mg via ORAL
  Filled 2016-04-14 (×2): qty 1

## 2016-04-14 MED ORDER — ATORVASTATIN CALCIUM 80 MG PO TABS
80.0000 mg | ORAL_TABLET | Freq: Every day | ORAL | Status: DC
  Administered 2016-04-14 – 2016-04-15 (×2): 80 mg via ORAL
  Filled 2016-04-14 (×2): qty 1

## 2016-04-14 MED ORDER — FAMOTIDINE 20 MG PO TABS
20.0000 mg | ORAL_TABLET | Freq: Two times a day (BID) | ORAL | Status: DC
  Administered 2016-04-15: 20 mg via ORAL
  Filled 2016-04-14: qty 1

## 2016-04-14 NOTE — ED Notes (Signed)
MD on call for family practiced notified about critical lab value

## 2016-04-14 NOTE — Consult Note (Signed)
Vascular and Vein Specialist of [facility]  Patient name: Randy [name] MRN: [id number] DOB: December 01, 1960 Sex: male  REASON FOR CONSULT: Carotid stenosis  HPI: Randy [name] is a 55 y.o. male well-known to our practice from prior diffuse peripheral vascular occlusive disease. He is status post right carotid endarterectomy and left carotid stenting in the past by Dr. Myra Gianotti. He was admitted with a syncopal episode. The patient specifically denies any focal neurologic deficits. He has had no episodes of a fascia or amaurosis fugax. He underwent cardiac evaluation and was found to have no specific cardiac source. He is status post coronary artery bypass grafting 2010. He does have a small infrarenal abdominal aortic aneurysm and is known to have a peripheral vascular disease. He reports that he is walking with claudication but this is tolerable.  Past Medical History:  Diagnosis Date  . AAA (abdominal aortic aneurysm) [facility]) July 2013  . Abnormal heart rhythm   . Anxiety   . CAD (coronary artery disease)   . Carotid artery occlusion   . GERD (gastroesophageal reflux disease)   . Heart attack 2010  . Hx of CABG 2010  . Hyperlipidemia   . Hypertension   . Irregular heartbeat   . Lung nodule   . Polysubstance abuse   . PVD (peripheral vascular disease) (HCC)   . Shortness of breath    when lying flat on back  . Stroke [facility])     Family History  Problem Relation Age of Onset  . Asthma Mother   . Diabetes Mother   . Hyperlipidemia Mother   . Hypertension Mother   . Lung cancer Father   . Cancer Father   . Stroke Sister     SOCIAL HISTORY: Social History  Substance Use Topics  . Smoking status: Former Smoker    Packs/day: 0.25    Years: 30.00    Types: Cigarettes    Quit date: 01/13/2013  . Smokeless tobacco: Never Used  . Alcohol use Yes     Comment: occasionally    No Known Allergies  Current Facility-Administered Medications    Medication Dose Route Frequency Provider Last Rate Last Dose  . aspirin EC tablet 81 mg  81 mg Oral Daily Marquette Saa, MD   81 mg at 04/14/16 0847  . atorvastatin (LIPITOR) tablet 80 mg  80 mg Oral Daily Marquette Saa, MD   80 mg at 04/14/16 0846  . carvedilol (COREG) tablet 12.5 mg  12.5 mg Oral BID WC Marquette Saa, MD   12.5 mg at 04/14/16 1830  . clopidogrel (PLAVIX) tablet 75 mg  75 mg Oral Q breakfast Marquette Saa, MD   75 mg at 04/14/16 0846  . enoxaparin (LOVENOX) injection 40 mg  40 mg Subcutaneous Daily Marquette Saa, MD   40 mg at 04/14/16 1406  . [START ON 04/15/2016] famotidine (PEPCID) tablet 20 mg  20 mg Oral BID Uvaldo Rising, MD      . folic acid (FOLVITE) tablet 1 mg  1 mg Oral Daily Marquette Saa, MD   1 mg at 04/14/16 0847  . lisinopril (PRINIVIL,ZESTRIL) tablet 40 mg  40 mg Oral Daily Marquette Saa, MD   40 mg at 04/14/16 0846  . LORazepam (ATIVAN) tablet 1 mg  1 mg Oral Q6H PRN Marquette Saa, MD       Or  . LORazepam (ATIVAN) injection 1 mg  1 mg Intravenous Q6H PRN Marquette Saa, MD      .  multivitamin with minerals tablet 1 tablet  1 tablet Oral Daily Marquette SaaAbigail Joseph Lancaster, MD   1 tablet at 04/14/16 0846  . ondansetron (ZOFRAN) tablet 4 mg  4 mg Oral Q6H PRN Marquette SaaAbigail Joseph Lancaster, MD       Or  . ondansetron [facility](ZOFRAN) injection 4 mg  4 mg Intravenous Q6H PRN Marquette SaaAbigail Joseph Lancaster, MD      . sodium chloride flush (NS) 0.9 % injection 3 mL  3 mL Intravenous Q12H Marquette SaaAbigail Joseph Lancaster, MD   3 mL at 04/14/16 0847  . thiamine (VITAMIN B-1) tablet 100 mg  100 mg Oral Daily Marquette SaaAbigail Joseph Lancaster, MD   100 mg at 04/14/16 0846  . traZODone (DESYREL) tablet 150 mg  150 mg Oral QHS Marquette SaaAbigail Joseph Lancaster, MD        REVIEW OF SYSTEMS:  [X]  denotes positive finding, [ ]  denotes negative finding Cardiac  Comments:  Chest pain or chest pressure:    Shortness of  breath upon exertion: x   Short of breath when lying flat:    Irregular heart rhythm:        Vascular    Pain in calf, thigh, or hip brought on by ambulation: x   Pain in feet at night that wakes you up from your sleep:     Blood clot in your veins:    Leg swelling:           PHYSICAL EXAM: Vitals:   04/14/16 1055 04/14/16 1055 04/14/16 1813 04/14/16 1830  BP:  (!) 155/78 (!) 177/94   Pulse:  63 (!) 59 63  Resp:  16 16   Temp:  98 F (36.7 C) 98.2 F (36.8 C)   TempSrc:  Oral Oral   SpO2:  95% 97%   Weight: 177 lb (80.3 kg)     Height: 5' 10.5" (1.791 m)       GENERAL: The patient is a well-nourished male, in no acute distress. The vital signs are documented above. CARDIOVASCULAR: Well-healed right carotid incision and normal carotid pulsation bilaterally. 2+ radial pulses. Absent femoral pulses bilaterally PULMONARY: There is good air exchange  MUSCULOSKELETAL: There are no major deformities or cyanosis. NEUROLOGIC: No focal weakness or paresthesias are detected. SKIN: There are no ulcers or rashes noted. PSYCHIATRIC: The patient has a normal affect.  DATA:  Carotid duplex suggests 80-99% right carotid stenosis and the prior endarterectomy site. Left carotid stent shows moderate stenosis. This is a progression from August 2015 when the right carotid had a 50-79% stenosis.  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 Discussed this at length with the patient. Will require further evaluation with either CT angiogram of her formal catheter-based arteriogram with the discussion regarding carotid stenting be versus redo endarterectomy. He understands this is nonurgent since he is not symptomatic. Will notify Dr. Myra GianottiBrabham of his admission in the morning.    Larina Earthlyodd F. Early, MD FACS [facility] Office Tel [phone] Pager [phone]

## 2016-04-14 NOTE — Progress Notes (Signed)
Family Medicine Teaching Service Daily Progress Note Intern Pager: [phone]  Patient name: Randy [name] Medical record number: [id number] Date of birth: 07/31/1960 Age: 55 y.o. Gender: male  Primary Care Provider: Howard PouchLauren Feng, MD Consultants: Cardiology Code Status: Full Code  Pt Overview and Major Events to Date:  1. Admitted to FMTS under attending Fletke  Assessment and Plan: Randy [name] is a 55 y.o. male presenting with syncope and chest pain . PMH is significant for CAD status post CABG, AAA, CVA  Syncope:  Syncopal episode in the setting of taking antihypertensives in conjunctino with alcohol use and persistent coughing. Hypotensive on admission down to 89 systolic, normally hypertensive to the 140s-50s.  Reportedly had provoked syncopal episode last week also from coughing.  Denies any orthostatic symptoms, or lightheadedness with bowel movements.  EKG showed nonspecific T-wave inversions in the lateral leads change from last EKG.  First troponin negative. Hemoglobin within normal limits. Likely secondary to vasovagal stimulation induced by coughing. No focal neurological deficits. S/p 2L NS bolus in ED.  BNP elevated to 210. No SOB currently and lungs are clear with some faint crackles in bilateral bases.  No LE edema.   - consider appointment with Dr. Raymondo BandKoval for 24hr BP monitoring upon discharge - [facility] - Vitals per floor protocol - AM CBC/BMP - Echo and carotid dopplers to be completed today - Consult cards  Chest pain:  Chronic in nature in the left chest, described as pressure. Received nitroglycerin en route to ED, with some relief.  Takes plavix daily. Significant CAD history including CABG, CVA and stable AAA, and PVD with claudication.  EKG showed nonspecific T-wave inversions in the lateral leads changed since last EKG and first troponin is negative. HEART score 6. iStat troponin 0.02.  Repeat EKG this morning showed similar pattern that  was seen at presentation.  - Cycle troponins - Cardiac monitoring - Follow-up echo and carotid Dopplers - Consult cards as above  AAA:  Greater than 4 cm. Abdominal ultrasound last week showed stable size from last study in July. - Continue outpatient monitoring  CAD:  CABG in 2010. Poorly controlled hypertension, last A1c in 2015 was less than 6. Patient is a former smoker. Complaint of chest pressure the left chest that was relieved with nitroglycerin.  - Continue Lipitor 80 mg daily - Continue Plavix 75 mg - Trend troponins - Repeat hemoglobin A1c - Chest pain management as above  Hyperlipidemia: Lipid Panel 10/2 Cholesterol 264, Triglycerides 168, HDL 40, LDL 190. - Continue lipitor 80mg    Hypertension: Takes coreg and lisinopril at home.  Hypotensive on admission to 89/59. Systolic blood pressure now in the 160s.  Consider 24hr blood pressure monitoring to determine if patient has hypotensive episodes, especially with coughing.  - f/u with Dr. Raymondo BandKoval in BP clinic for monitor - Continue Coreg 12.5 mg twice a day and lisinopril 40 mg daily  Polysubstance abuse:  Denies illicit drugs, does drink alcohol. Had 4 beers today immediately prior to incident. Typical daily use unclear. Ethanol level elevated at 71 on admission.  Not scoring on CIWA. - CIWA protocol  FEN/GI: Maintenance IV fluid/ heart healthy diet Prophylaxis: Plavix/PPI  Disposition: Admit to FMTS  Subjective:  Feels great this morning.  Wants to leave. No CP, COB, NVD.    Objective: Temp:  [97.6 F (36.4 C)-98 F (36.7 C)] 98 F (36.7 C) (10/16 1055) Pulse Rate:  [63-81] 63 (10/16 1055) Resp:  [13-18] 16 (10/16 1055) BP: (89-176)/(59-99)  155/78 (10/16 1055) SpO2:  [92 %-97 %] 95 % (10/16 1055) Weight:  [177 lb (80.3 kg)] 177 lb (80.3 kg) (10/16 1055) Physical Exam: General: 55 year old man sitting up in bed, appears comfortable Eyes: EOMI, PERRL, non-injected ENTM: MMM Neck: supple Cardiovascular:  Regular rate and rhythm, S1-S2 present, no murmurs Respiratory: Clear to auscultation bilaterally, normal work of breathing, no wheezing or rhonchi.  Faint crackles in bibasilar lobes.  Gastrointestinal: Soft, nontender, nondistended MSK: Moves all extremities, no LE edema Derm: Warm and dry Neuro: Alert and oriented 3, no focal neurological deficits Psych: Normal mood and affect  Laboratory:  Recent Labs Lab 04/13/16 1920 04/14/16 0607  WBC 8.5 5.6  HGB 13.8 13.9  HCT 41.6 42.0  PLT 163 153    Recent Labs Lab 04/13/16 1920 04/14/16 0607  NA 135 137  K 3.6 4.0  CL 101 108  CO2 26 24  BUN 15 14  CREATININE 1.17 1.06  CALCIUM 8.7* 8.1*  PROT 5.9*  --   BILITOT 0.9  --   ALKPHOS 69  --   ALT 23  --   AST 21  --   GLUCOSE 82 93    Imaging/Diagnostic Tests: Dg Chest 2 View  Result Date: 04/13/2016 CLINICAL DATA:  Intermittent chest pain for 6 months. Dizziness, shortness of breath, coughing EXAM: CHEST  2 VIEW COMPARISON:  10/22/2013 FINDINGS: There is bilateral interstitial prominence. There is no focal parenchymal opacity. There is no pleural effusion or pneumothorax. The heart and mediastinal contours are unremarkable. There is evidence of prior CABG. The osseous structures are unremarkable. IMPRESSION: No active cardiopulmonary disease. Electronically Signed   By: Elige Ko   On: 04/13/2016 19:20    Renne Musca, MD 04/14/2016, 12:13 PM PGY-1, [facility] Family Medicine FPTS Intern pager: [phone], text pages welcome

## 2016-04-14 NOTE — Consult Note (Addendum)
CARDIOLOGY CONSULT NOTE   Patient ID: Randy [name] MRN: [id number] DOB/AGE: August 29, 1960 55 y.o.  Admit date: 04/13/2016  Requesting Physician: Dr. Burlene Arnt Primary Physician:   Howard Pouch, MD Primary Cardiologist:  Dr. Delton See Reason for Consultation:  Chest pain  HPI: Randy [name] is a 55 y.o. male with a history of CAD s/p CABG (2010), AAA, carotid artery disease s/p R CEA and L carotid stenting, severe PVD with lifestyle limiting claudication, HTN, polysubstance abuse (previously cocaine, now marijuana and alcohol), CVA (2015), and uncontrolled HLD who presented to [facility] last night with syncope and chest pain.   He has a history of CABG in 2010 (CABG x 4 LIMA to LAD, SVG to diagonal, OM, PDA and right carotid endarterectomy) with no cardiology follow up- per patient nobody told him to follow. He has severe peripheral vascular disease (the patient was planned to have aortobifemoral bypass but I am not sure this was ever completed ( cannot find op note).   He was admitted in 09/2013 for chest pain and ruled out. Seen by Dr. Delton See who ordered a Steffanie Dunn that showed no pharmacologic-induced ischemia and EF of 48%.  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 2D ECHO at that time showed normal LV function with G1DD.   He presented to [facility] last night after a syncopal episode in the setting of taking antihypertensives in conjunctinon with alcohol use and persistent coughing. EMS was called and he was found to be hypotensive on admission with SBP 89. Reportedly had provoked syncopal episode last week also from coughing as well. EKG showed nonspecific T-wave inversions in the lateral leads change from last EKG.   Currently he is resting comfortably in bed with no complaints. He is upset because he recently had an echo that we "lost the images for." Mad that we have to repeat this today. He really wants to go home. No more syncopal episodes. He  admits to chronic chest pain and SOB. He Is disabled and physical activity is limited by severe claudication in legs. The most he does is walk to the mailbox or do some raking and this causes his significant SOB and some chest pain. He also gets some chest pain with a deep breath in. No LE edema, but he has chronic orthopnea and intermittent PND. He stays dizzy but has only had the two recent syncopal episodes that were both in the setting of coughing. No palpitations or blood in his stool or urine. He continues to smoke a couple cigarettes a week, drinks 2-6 beer 4x a week. He smokes mariajuana for main management but no other illicit drug use. He does take all of his medications including his statin.    Past Medical History:  Diagnosis Date  . AAA (abdominal aortic aneurysm) [facility]) July 2013  . Abnormal heart rhythm   . Anxiety   . CAD (coronary artery disease)   . Carotid artery occlusion   . GERD (gastroesophageal reflux disease)   . Heart attack 2010  . Hx of CABG 2010  . Hyperlipidemia   . Hypertension   . Irregular heartbeat   . Lung nodule   . Polysubstance abuse   . PVD (peripheral vascular disease) (HCC)   . Shortness of breath    when lying flat on back  . Stroke [facility])      Past Surgical History:  Procedure Laterality Date  . ABDOMINAL AORTAGRAM N/A 04/19/2013   Procedure:  ABDOMINAL AORTAGRAM;  Surgeon: Nada LibmanVance W Brabham, MD;  [facility];  Service: Cardiovascular;  Laterality: N/A;  . CAROTID ENDARTERECTOMY Left 01-13-13   Attempted cea  . CAROTID STENT INSERTION Left 02/22/2013   Procedure: CAROTID STENT INSERTION;  Surgeon: Nada LibmanVance W Brabham, MD;  [facility];  Service: Cardiovascular;  Laterality: Left;  . CORONARY ARTERY BYPASS GRAFT  2010  . ENDARTERECTOMY Left 01/13/2013   Procedure: ATTEMPTED ENDARTERECTOMY CAROTID;  Surgeon: Nada LibmanVance W Brabham, MD;  [facility];  Service: Vascular;  Laterality: Left;    No Known Allergies  I have reviewed the  patient's current medications . sodium chloride   Intravenous STAT  . aspirin EC  81 mg Oral Daily  . atorvastatin  80 mg Oral Daily  . carvedilol  12.5 mg Oral BID WC  . clopidogrel  75 mg Oral Q breakfast  . enoxaparin (LOVENOX) injection  40 mg Subcutaneous Daily  . [START ON 04/15/2016] famotidine  20 mg Oral BID  . folic acid  1 mg Oral Daily  . lisinopril  40 mg Oral Daily  . multivitamin with minerals  1 tablet Oral Daily  . sodium chloride flush  3 mL Intravenous Q12H  . thiamine  100 mg Oral Daily  . traZODone  150 mg Oral QHS     LORazepam **OR** LORazepam, ondansetron **OR** ondansetron (ZOFRAN) IV  Prior to Admission medications   Medication Sig Start Date End Date Taking? Authorizing Provider  aspirin EC 81 MG tablet Take 1 tablet (81 mg total) by mouth daily. 03/31/16  Yes Howard PouchLauren Feng, MD  atorvastatin (LIPITOR) 80 MG tablet Take 1 tablet (80 mg total) by mouth daily. 03/31/16  Yes Howard PouchLauren Feng, MD  carvedilol (COREG) 12.5 MG tablet Take 1 tablet (12.5 mg total) by mouth 2 (two) times daily with a meal. 03/31/16  Yes Howard PouchLauren Feng, MD  clopidogrel (PLAVIX) 75 MG tablet Take 1 tablet (75 mg total) by mouth daily with breakfast. 03/31/16  Yes Howard PouchLauren Feng, MD  cyclobenzaprine (FLEXERIL) 10 MG tablet Take 1 tablet (10 mg total) by mouth 3 (three) times daily as needed for muscle spasms. 03/31/16  Yes Howard PouchLauren Feng, MD  lisinopril (PRINIVIL,ZESTRIL) 40 MG tablet Take 1 tablet (40 mg total) by mouth daily. 03/31/16  Yes Howard PouchLauren Feng, MD  methocarbamol (ROBAXIN) 750 MG tablet Take 2 tablets (1,500 mg total) by mouth every 6 (six) hours as needed for muscle spasms. 09/08/14  Yes Tommie SamsJayce G Cook, DO  ranitidine (ZANTAC) 300 MG tablet Take 1 tablet (300 mg total) by mouth daily. 03/31/16  Yes Howard PouchLauren Feng, MD  traMADol (ULTRAM) 50 MG tablet Take 1-2 tablets (50-100 mg total) by mouth every 8 (eight) hours as needed. 09/08/14  Yes Tommie SamsJayce G Cook, DO  traZODone (DESYREL) 150 MG tablet Take 1 tablet (150 mg  total) by mouth at bedtime. 07/19/14  Yes Tyrone Nineyan B Grunz, MD     Social History   Social History  . Marital status: Widowed    Spouse name: N/A  . Number of children: 1  . Years of education: 9th   Occupational History  . not working.     Social History Main Topics  . Smoking status: Former Smoker    Packs/day: 0.25    Years: 30.00    Types: Cigarettes    Quit date: 01/13/2013  . Smokeless tobacco: Never Used  . Alcohol use Yes     Comment: occasionally  . Drug use:     Types: Marijuana  .  Sexual activity: Yes   Other Topics Concern  . Not on file   Social History Narrative   Pt is widowed and lives alone.   Patient is right handed   Patient drinks about 2cups of caffeine daily.          Family Status  Relation Status  . Mother Deceased  . Father Deceased  . Sister    Family History  Problem Relation Age of Onset  . Asthma Mother   . Diabetes Mother   . Hyperlipidemia Mother   . Hypertension Mother   . Lung cancer Father   . Cancer Father   . Stroke Sister     ROS:  Full 14 point review of systems complete and found to be negative unless listed above.  Physical Exam: Blood pressure (!) 155/78, pulse 63, temperature 98 F (36.7 C), temperature source Oral, resp. rate 16, height 5' 10.5" (1.791 m), weight 177 lb (80.3 kg), SpO2 95 %.  General: Well developed, well nourished, male in no acute distress chronically ill appearing Head: Eyes PERRLA, No xanthomas.   Normocephalic and atraumatic, oropharynx without edema or exudate.  Lungs: CTAB Heart: HRRR S1 S2, no rub/gallop, Heart regular rate and rhythm with S1, S2  murmur. pulses are 2+ extrem.   Neck: No carotid bruits. No lymphadenopathy.  No JVD. Abdomen: Bowel sounds present, abdomen soft and non-tender without masses or hernias noted. Msk:  No spine or cva tenderness. No weakness, no joint deformities or effusions. Extremities: No clubbing or cyanosis. No LE edema.  Neuro: Alert and oriented X 3. No  focal deficits noted. Psych:  Good affect, responds appropriately Skin: No rashes or lesions noted.  Labs:   Lab Results  Component Value Date   WBC 5.6 04/14/2016   HGB 13.9 04/14/2016   HCT 42.0 04/14/2016   MCV 86.1 04/14/2016   PLT 153 04/14/2016   No results for input(s): INR in the last 72 hours.   Recent Labs Lab 04/13/16 1920 04/14/16 0607  NA 135 137  K 3.6 4.0  CL 101 108  CO2 26 24  BUN 15 14  CREATININE 1.17 1.06  CALCIUM 8.7* 8.1*  PROT 5.9*  --   BILITOT 0.9  --   ALKPHOS 69  --   ALT 23  --   AST 21  --   GLUCOSE 82 93  ALBUMIN 3.5  --    Magnesium  Date Value Ref Range Status  10/12/2008 2.2 1.5 - 2.5 mg/dL Final    Recent Labs  16/10/96 0109 04/14/16 0607 04/14/16 1204  TROPONINI 0.05* 0.05* 0.05*    Recent Labs  04/13/16 1931  TROPIPOC 0.02   Pro B Natriuretic peptide (BNP)  Date/Time Value Ref Range Status  10/22/2013 10:35 PM 355.2 (H) 0 - 125 pg/mL Final   Lab Results  Component Value Date   CHOL 264 (H) 03/31/2016   HDL 40 03/31/2016   LDLCALC 190 (H) 03/31/2016   TRIG 168 (H) 03/31/2016   Lab Results  Component Value Date   DDIMER 0.37 03/18/2011   Lipase  Date/Time Value Ref Range Status  10/23/2013 12:34 AM 55 11 - 59 U/L Final   TSH  Date/Time Value Ref Range Status  04/14/2016 06:07 AM 1.292 0.350 - 4.500 uIU/mL Final    Comment:    Performed by a 3rd Generation assay with a functional sensitivity of <=0.01 uIU/mL.  02/10/2012 04:54 PM 2.308 0.350 - 4.500 uIU/mL Final   No results found for: VITAMINB12,  FOLATE, FERRITIN, TIBC, IRON, RETICCTPCT  Echo: 02/09/2014 Study Conclusions - Left ventricle: The cavity size was normal. There was mild concentric hypertrophy. Systolic function was normal. Possible hypokinesis of the inferior myocardium. Doppler parameters are consistent with abnormal left ventricular relaxation (grade 1 diastolic dysfunction). - Aortic root: The aortic root was mildly  dilated. - Left atrium: The atrium was mildly dilated.  ECG:  NSR with some non specific ST/TW abnormalities in lateral leads.  DATE OF PROCEDURE:  10/06/2008 CARDIAC CATHETERIZATION  CONCLUSION:  1. Severe three-vessel coronary artery disease.  2. Ischemic left ventricular dysfunction. EF 45%   PLAN:  At the present time, we will get a surgical consult.  The patient  has severe coronary artery disease.  The LAD is severely involved as is  the RCA diffusely and is not appropriate for percutaneous intervention.  Nuc 09/2013 IMPRESSION: 1. Negative for pharmacologic-stress induced ischemia. 2. Left ventricular ejection fraction 48%.    Radiology:  Dg Chest 2 View  Result Date: 04/13/2016 CLINICAL DATA:  Intermittent chest pain for 6 months. Dizziness, shortness of breath, coughing EXAM: CHEST  2 VIEW COMPARISON:  10/22/2013 FINDINGS: There is bilateral interstitial prominence. There is no focal parenchymal opacity. There is no pleural effusion or pneumothorax. The heart and mediastinal contours are unremarkable. There is evidence of prior CABG. The osseous structures are unremarkable. IMPRESSION: No active cardiopulmonary disease. Electronically Signed   By: Elige Ko   On: 04/13/2016 19:20    ASSESSMENT AND PLAN:    Principal Problem:   Syncope and collapse Active Problems:   CAD, ARTERY BYPASS GRAFT   Pulmonary nodule   Anxiety   AAA (abdominal aortic aneurysm) without rupture (HCC)   GERD (gastroesophageal reflux disease)   PVD (peripheral vascular disease) with claudication (HCC)   HLD (hyperlipidemia)   Hypotension   Dehydration  Chest pain:  troponin low and flat 0.05-->0.05. ECG with some non specific TWIs in lateral leads. Chest pain is chronic and related to exertion as well as a deep breath in. He is denying current chest pain and wants to go home. I think if ECHO today shows normal LV function with no significant WMAs,  plan will be outpatient follow up where  stress test can be arranged. I have obtained a follow up appointment with Robbie Lis PA-C in our office on 10/26.   AAA:  Greater than 4 cm. Abdominal ultrasound 02/29/16 showed stable size from last study in July. Continue outpatient monitoring by VVS  CAD:  CABG in 2010. Continue ASA, plavix, statin and BB.   Uncontrolled Hyperlipidemia: Lipid Panel 10/2 Cholesterol 264, Triglycerides 168, HDL 40, LDL 190. Continue lipitor 80mg . Question compliance. If he really does take his statin and can demonstrate compliance with follow up, would consider referral to lipid clinic to see if he is a candidate for PCSK 9 inhibitor therapy. We may want to add Zetia 10mg   Daily now for futher LDL reduction.   Hypertension: Takes coreg and lisinopril at home.  Hypotensive on admission to 89/59. Systolic blood pressure now in the 160s. Continue Coreg 12.5 mg twice a day and lisinopril 40 mg daily  Polysubstance abuse:  Denies illicit drugs, does drink alcohol. Had 4 beers today immediately prior to incident.  Ethanol level elevated at 71 on admission.    Syncope: likely related to alcohol use, hypotension and coughing. Continue to monitor on tele. Would not work up further at this point.   Severe PVD with lifestyle limiting claudication: counseled on tobacco  cessation. Follow up with VVS  Signed: Cline Crock, PA-C 04/14/2016 2:42 PM  Pager [phone]  Co-Sign MD  I have examined the patient and reviewed assessment and plan and discussed with patient.  Agree with above as stated.  Would benefit from decreased alcohol use.  He needs to stop smoking.  Chest pain is atypical.  It is related to deep breathing.  He has not had sx like he had before his CABG.  Will plan outpatient f/u.  OK to discharge from a cardiac standpoint.    Lance Muss   ADDENDUM: called by family medicine service. Patient found to have 80-99% stenosis of RICA on preliminary report. Discussed with Dr. Eldridge Dace. Plan  is for discharge with outpatient cardiology follow up and outpatient vascular surgery follow up. Per Dr. Eldridge Dace, inpatient VVS consult not necessary at this time.  Patient insists on leaving the hospital today, ASAP.  Outpatient f/u planned.

## 2016-04-14 NOTE — Progress Notes (Signed)
Echocardiogram 2D Echocardiogram has been performed.  Randy [name] 04/14/2016, 2:53 PM

## 2016-04-14 NOTE — Progress Notes (Addendum)
*  PRELIMINARY RESULTS* Vascular Ultrasound Carotid Duplex (Doppler) has been completed.   The right internal carotid artery origin exhibits elevated velocities suggestive of 80-99% stenosis.  The left internal carotid artery exhibits elevated velocities suggestive of upper range <50% stenosis versus low range >50% stenosis.  The right vertebral artery is patent with antegrade flow.  The left vertebral artery is patent with retrograde flow, suggestive of possible proximal obstruction.  Preliminary results discussed with Dr. Jonathon Jordan.  04/14/2016 3:42 PM Gertie Fey, BS, RVT, RDCS, RDMS

## 2016-04-14 NOTE — Progress Notes (Signed)
Patient admitted this morning with no acute distress noted. AAO x4, ambulate independently. Denies any pain. Orientation to safety done and patient verbalized understanding. Patient wanting to go home today and will sign AMA if no d/c will be ordered. Tele removed by patient. Called and notified Dr. Jonathon Jordan. She talked to the patient on the phone and patient agreed to stay. Tele monitor re-attached to the patient.

## 2016-04-15 DIAGNOSIS — R55 Syncope and collapse: Secondary | ICD-10-CM | POA: Diagnosis not present

## 2016-04-15 DIAGNOSIS — I959 Hypotension, unspecified: Secondary | ICD-10-CM | POA: Diagnosis not present

## 2016-04-15 DIAGNOSIS — I739 Peripheral vascular disease, unspecified: Secondary | ICD-10-CM | POA: Diagnosis not present

## 2016-04-15 DIAGNOSIS — I714 Abdominal aortic aneurysm, without rupture: Secondary | ICD-10-CM | POA: Diagnosis not present

## 2016-04-15 LAB — VAS US CAROTID
LCCADSYS: -94 cm/s
LCCAPDIAS: 27 cm/s
LCCAPSYS: 76 cm/s
LEFT ECA DIAS: -42 cm/s
LEFT VERTEBRAL DIAS: -26 cm/s
LICADDIAS: -77 cm/s
LICADSYS: -161 cm/s
Left CCA dist dias: -34 cm/s
RCCAPDIAS: 11 cm/s
RCCAPSYS: 46 cm/s
RIGHT ECA DIAS: -35 cm/s
RIGHT VERTEBRAL DIAS: 11 cm/s
Right cca dist sys: -57 cm/s

## 2016-04-15 LAB — HEMOGLOBIN A1C
HEMOGLOBIN A1C: 6.2 % — AB (ref 4.8–5.6)
MEAN PLASMA GLUCOSE: 131 mg/dL

## 2016-04-15 NOTE — Discharge Instructions (Signed)
You were admitted to the hospital for loss of consciousness and chest pain. You were found to have a narrowing of your artery in your neck that needs to be followed up with Vascular surgery.   Please make an appointment ASAP with Dr. Hollice Espy [facility] [phone].  Please follow up with your primary care doctor on Friday and stop smoking.

## 2016-04-15 NOTE — Progress Notes (Signed)
Randy [name] to be D/C'd Home per MD order.  Discussed with the patient and all questions fully answered.    Medication List    STOP taking these medications   methocarbamol 750 MG tablet Commonly known as:  ROBAXIN     TAKE these medications   aspirin EC 81 MG tablet Take 1 tablet (81 mg total) by mouth daily.   atorvastatin 80 MG tablet Commonly known as:  LIPITOR Take 1 tablet (80 mg total) by mouth daily.   carvedilol 12.5 MG tablet Commonly known as:  COREG Take 1 tablet (12.5 mg total) by mouth 2 (two) times daily with a meal.   clopidogrel 75 MG tablet Commonly known as:  PLAVIX Take 1 tablet (75 mg total) by mouth daily with breakfast.   cyclobenzaprine 10 MG tablet Commonly known as:  FLEXERIL Take 1 tablet (10 mg total) by mouth 3 (three) times daily as needed for muscle spasms.   lisinopril 40 MG tablet Commonly known as:  PRINIVIL,ZESTRIL Take 1 tablet (40 mg total) by mouth daily.   ranitidine 300 MG tablet Commonly known as:  ZANTAC Take 1 tablet (300 mg total) by mouth daily.   traMADol 50 MG tablet Commonly known as:  ULTRAM Take 1-2 tablets (50-100 mg total) by mouth every 8 (eight) hours as needed.   traZODone 150 MG tablet Commonly known as:  DESYREL Take 1 tablet (150 mg total) by mouth at bedtime.       VVS, Skin clean, dry and intact without evidence of skin break down, no evidence of skin tears noted. IV catheter discontinued intact. Site without signs and symptoms of complications. Dressing and pressure applied.  An After Visit Summary was printed and given to the patient. Patient escorted via WC, and D/C home via private auto.  Ernestina Patches D 04/15/2016 2:21 PM

## 2016-04-15 NOTE — Discharge Summary (Signed)
Family Medicine Teaching [facility] Discharge Summary  Patient name: Randy [name] Medical record number: [id number] Date of birth: Nov 15, 1960 Age: 55 y.o. Gender: male Date of Admission: 04/13/2016  Date of Discharge: 04/15/2016 Admitting Physician: Uvaldo Rising, MD  Primary Care Provider: Howard Pouch, MD Consultants: Cardiology, vascular surgery  Indication for Hospitalization: Syncope, chest pain  Discharge Diagnoses/Problem List:  Patient Active Problem List   Diagnosis Date Noted  . Syncope and collapse 04/14/2016  . Dehydration   . Hypotension 04/13/2016  . Insomnia 04/11/2016  . Orthopnea 03/31/2016  . 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 HLD (hyperlipidemia) 04/05/2014  . PVD (peripheral vascular disease) with claudication (HCC) 05/09/2013  . GERD (gastroesophageal reflux disease) 03/28/2013  . AAA (abdominal aortic aneurysm) without rupture (HCC) 10/28/2012  . Anxiety 02/12/2012  . Uncontrolled hypertension 02/02/2012  . Occlusion and stenosis of carotid artery without mention of cerebral infarction 01/19/2012  . Pulmonary nodule 05/19/2011  . CAD, ARTERY BYPASS GRAFT 10/19/2008  . DISC DISEASE, LUMBAR 10/19/2008    Disposition: Discharge home  Discharge Condition: Stable, improved  Discharge Exam:  General: 55 year old man sitting up in bed, appears comfortable Eyes: EOMI, PERRL, non-injected ENTM: MMM Neck: supple Cardiovascular: Regular rate and rhythm, S1-S2 present, no murmurs Respiratory: Clear to auscultation bilaterally, normal work of breathing, no wheezing or rhonchi.  Faint crackles in bibasilar lobes.  Gastrointestinal: Soft, nontender, nondistended MSK: Moves all extremities, no LE edema Derm: Warm and dry Neuro: Alert and oriented 3, no focal neurological deficits Psych: Normal mood and affect  Brief Hospital Course:  55 year old male admitted for syncopal episode and chest pain. Chest pain  resolved by the time he arrived to [facility] emergency department. Troponins were trended, and were not elevated beyond 0.05.  Patient was admitted to telemetry and was monitored overnight and had no events. He had echocardiogram that showed ejection fraction of 55% and carotid dopplers showed right internal carotid artery stenosis of 80-99%, which is a progression from August 2015 with a right carotid had a 50-79% stenosis.  He was seen by cardiology, who noted that he would benefit from decreased alcohol use and that he needs to stop smoking. Noted that chest pain is atypical and is likely related to deep breathing. They signed off with plans to follow-up with him in the outpatient setting.  Was also seen by vascular surgery, Dr. early. Discussed the high-grade restenosis of his right carotid artery and that it will require further evaluation. Options for interventions could include chronic stenting versus redoing an endarterectomy. His case is nonurgent since he is not symptomatic. Spoke with Dr. Myra Gianotti of vascular and pain specialist at [facility], who stated that he can follow up with him in the outpatient setting to discuss options. At the time of discharge patient was asymptomatic, denied chest pain, shortness of breath.   Issues for Follow Up:  1. Syncopal episode: Felt to be secondary to coughing episode, likely vasovagal. Possible that patient becomes hypotensive during these episodes and could benefit from 24-hour blood pressure monitoring with Dr. Raymondo Band. 2. High-grade restenosis of right carotid artery: Wound to have 80-99% right carotid stenosis, that is progressed from 50-79% in August 2015.  Needs outpatient follow-up with Dr. Myra Gianotti at vascular vein specialist at [facility]. Patient was asked to follow up and make appointment. 3. Smoking status: Patient is to stop smoking.   Significant Procedures: None  Significant Labs and Imaging:   Recent Labs Lab 04/13/16 1920 04/14/16 4098  WBC 8.5 5.6  HGB 13.8 13.9  HCT 41.6 42.0  PLT 163 153    Recent Labs Lab 04/13/16 1920 04/14/16 0607  NA 135 137  K 3.6 4.0  CL 101 108  CO2 26 24  GLUCOSE 82 93  BUN 15 14  CREATININE 1.17 1.06  CALCIUM 8.7* 8.1*  ALKPHOS 69  --   AST 21  --   ALT 23  --   ALBUMIN 3.5  --    Hemoglobin A1C: 6.2  Results/Tests Pending at Time of Discharge: None  Discharge Medications:    Medication List    STOP taking these medications   methocarbamol 750 MG tablet Commonly known as:  ROBAXIN     TAKE these medications   aspirin EC 81 MG tablet Take 1 tablet (81 mg total) by mouth daily.   atorvastatin 80 MG tablet Commonly known as:  LIPITOR Take 1 tablet (80 mg total) by mouth daily.   carvedilol 12.5 MG tablet Commonly known as:  COREG Take 1 tablet (12.5 mg total) by mouth 2 (two) times daily with a meal.   clopidogrel 75 MG tablet Commonly known as:  PLAVIX Take 1 tablet (75 mg total) by mouth daily with breakfast.   cyclobenzaprine 10 MG tablet Commonly known as:  FLEXERIL Take 1 tablet (10 mg total) by mouth 3 (three) times daily as needed for muscle spasms.   lisinopril 40 MG tablet Commonly known as:  PRINIVIL,ZESTRIL Take 1 tablet (40 mg total) by mouth daily.   ranitidine 300 MG tablet Commonly known as:  ZANTAC Take 1 tablet (300 mg total) by mouth daily.   traMADol 50 MG tablet Commonly known as:  ULTRAM Take 1-2 tablets (50-100 mg total) by mouth every 8 (eight) hours as needed.   traZODone 150 MG tablet Commonly known as:  DESYREL Take 1 tablet (150 mg total) by mouth at bedtime.       Discharge Instructions: Please refer to Patient Instructions section of EMR for full details.  Patient was counseled important signs and symptoms that should prompt return to medical care, changes in medications, dietary instructions, activity restrictions, and follow up appointments.   Follow-Up Appointments: Follow-up Information    Robbie LisBrittainy Simmons,  PA-C Follow up on 04/24/2016.   Specialties:  Cardiology, Radiology Why:  @ 1:30 pm. Please come 15 minutes early to your appointment Contact information: [address] [place] [address] [phone]           Renne Muscaaniel L Jaeleah Smyser, MD 04/15/2016, 2:29 PM PGY-1, [facility]

## 2016-04-16 ENCOUNTER — Encounter: Payer: Self-pay | Admitting: Cardiology

## 2016-04-16 ENCOUNTER — Telehealth: Payer: Self-pay | Admitting: Surgery

## 2016-04-16 NOTE — Telephone Encounter (Signed)
Spoke to pt on home # for appt 10/30

## 2016-04-16 NOTE — Telephone Encounter (Signed)
-----   Message from Sharee Pimple, RN sent at 04/16/2016  9:15 AM EDT ----- Regarding: schedule   ----- Message ----- From: Nada Libman, MD Sent: 04/15/2016  10:52 PM To: Vvs Charge Pool  I need to see him in the office in the next 1-2 weeks.  No studies

## 2016-04-18 ENCOUNTER — Ambulatory Visit (INDEPENDENT_AMBULATORY_CARE_PROVIDER_SITE_OTHER): Payer: Medicaid Other | Admitting: *Deleted

## 2016-04-18 VITALS — BP 150/88 | HR 72

## 2016-04-18 DIAGNOSIS — I10 Essential (primary) hypertension: Secondary | ICD-10-CM

## 2016-04-18 DIAGNOSIS — Z0130 Encounter for examination of blood pressure without abnormal findings: Secondary | ICD-10-CM

## 2016-04-18 NOTE — Progress Notes (Signed)
   Patient in nurse clinic for blood pressure check.  Patient denies chest pain, SOB, dizziness, headache today.  Patient reported taking his blood pressure medications as prescribed.  Patient has follow up appointment next Friday 04/25/16 with PCP.  Patient advised to go to ED if develops chest pain, SOB, dizziness, severe headache, n/v or weakness.  Patient stated understanding.  Will forward to PCP. Clovis Pu, RN  Today's Vitals   04/18/16 0626 04/18/16 0910  BP: (!) 160/90 (!) 150/88  Pulse: 72   SpO2: 97%   PainSc: 0-No pain

## 2016-04-24 ENCOUNTER — Ambulatory Visit (INDEPENDENT_AMBULATORY_CARE_PROVIDER_SITE_OTHER): Payer: Medicaid Other | Admitting: Cardiology

## 2016-04-24 ENCOUNTER — Encounter (INDEPENDENT_AMBULATORY_CARE_PROVIDER_SITE_OTHER): Payer: Self-pay

## 2016-04-24 ENCOUNTER — Encounter: Payer: Self-pay | Admitting: Cardiology

## 2016-04-24 ENCOUNTER — Encounter: Payer: Self-pay | Admitting: Surgery

## 2016-04-24 VITALS — BP 124/86 | HR 75 | Ht 70.0 in | Wt 185.8 lb

## 2016-04-24 DIAGNOSIS — R0789 Other chest pain: Secondary | ICD-10-CM

## 2016-04-24 NOTE — Progress Notes (Signed)
CC: HTN check, tobacco cessation  HPI: Randy [name] is a 55 y.o. male with a complicated PMH significant for HTN, PVD, AAA >4cm (stable 02/2016), history of CVA who presents for hospital follow up, blood pressure check and tobacco cessation counseling.   Background Patient recently returned to our care in August 2017 after being lost to follow-up and off of all of his medications for about a year and a half.  His medications were restarted in August and active medical problems that have been addressed across multiple visits since then have included AAA (stable), HTN (followed closely and titrating medications as necessary), PND and dyspnea on exertion (cardiac U/S 03/2016 normal), HLD (uncontrolled on max dose atorvastatin, needs further diet counseling), visual flashes/floaters (evaluated by ophthalmology; no retinal detachment per patient but need to obtain records).  Patient was recently hospitalized (10/15-10/17) for ACS rule out after an acute episode of syncope after he reportedly forgot to take his medications all day and then took them all at once in the afternoon after consuming multiple alcoholic beverages. He was hypotensive on admission and underwent cardiac echo as well as carotid dopplers which showed right internal carotid artery stenosis of 80-99%,  a progression from August 2015 with a right carotid had a 50-79% stenosis. Since discharge he has followed up with cardiology, plans to follow up with Dr. Myra Gianotti, vascular vein specialist in [place].  Today patient was seen for HTN and given option to address one of three important lifestyle modifications for his health (he identified all three without prompting) - alcohol use, dietary changes, or tobacco cessation. Patient chooses to address tobacco cessation today.  HTN - controlled today's visit as well as at cardiologist office yesterday - patient denies symptoms of headaches, chest pain, dyspnea, or vision changes at today's  visit  Tobacco Cessation - Started smoking at 58 or 55 years old - Smokes 2 cigarettes per week by history - Menthol cigarettes - Tends to smoke when drinking/ socially with friends. Also smokes as a vice when anxious or stressed.  - States he likes the taste of the cigarettes and sometimes craves them.  - Denies waking to smoke overnight.  - Longest time ever been tobacco free: 2 years.  - Patient has tried to quit cold-turkey but never had assistance or medications - Rates IMPORTANCE of quitting tobacco on 1-10 scale:  10/10. Reasons include wanting to prolong his life. - Rates CONFIDENCE of quitting tobacco on 1-10 scale: 10/10. States he has quit in the past and he feels confident he can quit again - Most common triggers to use tobacco include; Alcohol, spending time with others who smoke so he smells the smoke  Review of Symptoms: See HPI for ROS.   CC, SH/smoking status, and VS noted.  Objective: BP 133/86   Pulse 68   Temp 98 F (36.7 C) (Oral)   Ht 5\' 10"  (1.778 m)   Wt 187 lb 3.2 oz (84.9 kg)   SpO2 98%   BMI 26.86 kg/m   GEN: NAD, alert, cooperative, and pleasant. RESPIRATORY: clear to auscultation bilaterally with no wheezes, rhonchi or rales, good effort CV: RRR, no m/r/g, no peripheral edema GI: soft, non-tender, non-distended, normoactive bowel sounds, no hepatosplenomegaly PSYCH: AAOx3, appropriate affect  Cardiac TTE 04/14/2016 Study Conclusions - Left ventricle: The cavity size was normal. Wall thickness was   normal. The estimated ejection fraction was 55%. Left ventricular   diastolic function parameters were normal. - Left atrium: The atrium was mildly dilated. -  Atrial septum: No defect or patent foramen ovale was identified.  BP Readings from Last 3 Encounters:  04/24/16 124/86  04/18/16 (!) 150/88  04/15/16 (!) 169/79   BP: 133/86 04/25/2016  Assessment and plan:  Tobacco abuse Intermittent Nicotine Dependence of 50 years duration in a patient  who is good candidate for success b/c of motivation with recent hospitalizations, recent compliance with medical care. - Initiated varenicline tx. -  Patient counseled on purpose, proper use, and potential adverse effects, including GI upset, and potential change in mood.  - F/U phone call 05/09/2016.   - patient to follow up in our clinic in 1 month - quit date set to June 30 2016.   HTN (hypertension) - Controlled at today's visit and at previous visit with cardiologist yesterday - continue current regimen lisinopril and coreg - will check again at next visit in 1 month    Meds ordered this encounter  Medications  . varenicline (CHANTIX STARTING MONTH PAK) 0.5 MG X 11 & 1 MG X 42 tablet    Sig: Take one 0.5 mg tablet by mouth once daily for 3 days, then increase to one 0.5 mg tablet twice daily for 4 days, then increase to one 1 mg tablet twice daily.    Dispense:  53 tablet    Refill:  0     Howard PouchLauren Clevie Prout, MD,MS,  PGY1 04/25/2016 5:42 PM

## 2016-04-24 NOTE — Patient Instructions (Signed)
Medication Instructions:  Your physician recommends that you continue on your current medications as directed. Please refer to the Current Medication list given to you today.   Labwork: None ordered  Testing/Procedures: None ordered  Follow-Up: Your physician wants you to follow-up in 3 months with Dr.Nelson. You will receive a reminder letter in the mail two months in advance. If you don't receive a letter, please call our office to schedule the follow-up appointment.   Any Other Special Instructions Will Be Listed Below (If Applicable).     If you need a refill on your cardiac medications before your next appointment, please call your pharmacy.

## 2016-04-24 NOTE — Progress Notes (Signed)
04/24/2016 Randy PalmerKeith [name]   07/22/1960  [id number]  Primary Physician Randy PouchLauren Feng, MD Primary Cardiologist: Dr. Delton SeeNelson   Reason for Visit/CC: Randy [name] f/u for atypical chest pain and syncope   HPI:  Randy PalmerKeith [name] is a 55 y.o. male with a history of CAD s/p CABG (2010), AAA, carotid artery disease s/p R CEA and L carotid stenting, severe PVD with lifestyle limiting claudication, HTN, polysubstance abuse (previously cocaine, now marijuana and alcohol), CVA (2015), and uncontrolled HLD who [name] with syncope and chest pain. Troponin were low level, <0.05. 2D Echo showed ejection fraction of 55% and carotid dopplers showed right internal carotid artery stenosis of 80-99%, which is a progression from August 2015 with a right carotid had a 50-79% stenosis. Cardiology was consulted and he was seen by Dr. Abe [name]. His CP was felt to be atypical. Avoidance of ETOH was recommended. His last NST from 2015 was reviewed and negative for ischemia. Dr. Eldridge [name] did not feel that he needed any additional cardiac w/u. Vascular surgery was consulted. He was seen by Dr. Arbie [name]. Options for interventions could include carotid stenting versus redoing an endarterectomy. His case was nonurgent since he is not symptomatic. He has f/u with Dr. Myra [name] on 04/28/16.  He reports that he has done well. No recurrent syncope/ near syncope. No dizziness. No anginal type CP. He continues to have occasional atypical sharp, positional CP. BP is well controlled at 124/86. Pt notes this is significant improvement. He use to run in the 160s but his lisinopril was recently increased in [name] from 20 to 40 mg. He reports that he is fully compliant with ASA and Pavix.      Current Meds  Medication Sig  . aspirin EC 81 MG tablet Take 1 tablet (81 mg total) by mouth daily.  [name]. atorvastatin (LIPITOR) 80 MG tablet Take 1 tablet (80 mg total) by mouth daily.  . carvedilol (COREG) 12.5 MG tablet Take 1 tablet (12.5  mg total) by mouth 2 (two) times daily with a meal.  . clopidogrel (PLAVIX) 75 MG tablet Take 1 tablet (75 mg total) by mouth daily with breakfast.  . cyclobenzaprine (FLEXERIL) 10 MG tablet Take 1 tablet (10 mg total) by mouth 3 (three) times daily as needed for muscle spasms.  [name]. lisinopril (PRINIVIL,ZESTRIL) 40 MG tablet Take 1 tablet (40 mg total) by mouth daily.  . ranitidine (ZANTAC) 300 MG tablet Take 1 tablet (300 mg total) by mouth daily.  . traMADol (ULTRAM) 50 MG tablet Take 1-2 tablets (50-100 mg total) by mouth every 8 (eight) hours as needed.  . traZODone (DESYREL) 150 MG tablet Take 1 tablet (150 mg total) by mouth at bedtime.   No Known Allergies Past Medical History:  Diagnosis Date  . AAA (abdominal aortic aneurysm) [facility](HCC) July 2013  . Abnormal heart rhythm   . Anxiety   . CAD (coronary artery disease)   . Carotid artery occlusion   . GERD (gastroesophageal reflux disease)   . Heart attack 2010  . Hx of CABG 2010  . Hyperlipidemia   . Hypertension   . Irregular heartbeat   . Lung nodule   . Polysubstance abuse   . PVD (peripheral vascular disease) (HCC)   . Shortness of breath    when lying flat on back  . Stroke [facility](HCC)    Family History  Problem Relation Age of Onset  . Asthma Mother   . Diabetes Mother   . Hyperlipidemia Mother   . Hypertension  Mother   . Lung cancer Father   . Cancer Father   . Stroke Sister    Past Surgical History:  Procedure Laterality Date  . ABDOMINAL AORTAGRAM N/A 04/19/2013   Procedure: ABDOMINAL AORTAGRAM;  Surgeon: Nada Libman, MD;  Location: [name] C[facility];  Service: Cardiovascular;  Laterality: N/A;  . CAROTID ENDARTERECTOMY Left 01-13-13   Attempted cea  . CAROTID STENT INSERTION Left 02/22/2013   Procedure: CAROTID STENT INSERTION;  Surgeon: Nada Libman, MD;  Location: [facility] CATH LAB;  Service: Cardiovascular;  Laterality: Left;  . CORONARY ARTERY BYPASS GRAFT  2010  . ENDARTERECTOMY Left 01/13/2013   Procedure: ATTEMPTED  ENDARTERECTOMY CAROTID;  Surgeon: Nada Libman, MD;  Location: [facility] OR;  Service: Vascular;  Laterality: Left;   Social History   Social History  . Marital status: Widowed    Spouse name: N/A  . Number of children: 1  . Years of education: 9th   Occupational History  . not working.     Social History Main Topics  . Smoking status: Former Smoker    Packs/day: 0.25    Years: 30.00    Types: Cigarettes    Quit date: 01/13/2013  . Smokeless tobacco: Never Used  . Alcohol use Yes     Comment: occasionally  . Drug use:     Types: Marijuana  . Sexual activity: Yes   Other Topics Concern  . Not on file   Social History Narrative   Pt is widowed and lives alone.   Patient is right handed   Patient drinks about 2cups of caffeine daily.           Review of Systems: General: negative for chills, fever, night sweats or weight changes.  Cardiovascular: negative for chest pain, dyspnea on exertion, edema, orthopnea, palpitations, paroxysmal nocturnal dyspnea or shortness of breath Dermatological: negative for rash Respiratory: negative for cough or wheezing Urologic: negative for hematuria Abdominal: negative for nausea, vomiting, diarrhea, bright red blood per rectum, melena, or hematemesis Neurologic: negative for visual changes, syncope, or dizziness All other systems reviewed and are otherwise negative except as noted above.   Physical Exam:  Blood pressure 124/86, pulse 75, height 5\' 10"  (1.778 m), weight 185 lb 12.8 oz (84.3 kg).  General appearance: alert, cooperative and no distress Neck: + bilateral carotid bruits R>L, no  JVD Lungs: clear to auscultation bilaterally Heart: regular rate and rhythm, S1, S2 normal, no murmur, click, rub or gallop Extremities: extremities normal, atraumatic, no cyanosis or edema Pulses: 2+ and symmetric Skin: Skin color, texture, turgor normal. No rashes or lesions Neurologic: Grossly normal  EKG not performed   ASSESSMENT AND  PLAN:   1. Atypical Chest pain:sharp, positional CP worse with deep breathing. Not c/w coronary ischemia. No further w/u.   2. AAA: Greater than 4 cm. Abdominal ultrasound 02/29/16 showed stable size from last study in July. Continue outpatient monitoring by [facility]  3. CAD: CABG in 2010. No anginal symptomology. Continue ASA, plavix, statin and BB.   4. Uncontrolled Hyperlipidemia:Lipid Panel 10/2 Cholesterol 264, Triglycerides 168, HDL 40, LDL 190. Continue lipitor 80mg . Question compliance. If he really does take his statin and can demonstrate compliance with follow up, would consider referral to lipid clinic to [name] if he is a candidate for PCSK 9 inhibitor therapy. We may want to add Zetia 10mg   Daily now for futher LDL reduction.   5. Hypertension: Well controlled on Coreg and Lisinopril.   6. Bilateral Carotid Artery Disease: progression  in disease since last assessment in 2015. Recent doppler findings outlined in HPI.  He is asymptomatic.  Continue ASA, Plavix and statin. Patient denies any current tobacco use. BP is well controlled. He has f/u with Dr. Myra Gianotti next week to consider treatment options.   PLAN  F/u in 3 months.   Robbie Lis PA-C 04/24/2016 1:48 PM

## 2016-04-25 ENCOUNTER — Ambulatory Visit (INDEPENDENT_AMBULATORY_CARE_PROVIDER_SITE_OTHER): Payer: Medicaid Other | Admitting: Student in an Organized Health Care Education/Training Program

## 2016-04-25 ENCOUNTER — Encounter: Payer: Self-pay | Admitting: Student in an Organized Health Care Education/Training Program

## 2016-04-25 DIAGNOSIS — Z720 Tobacco use: Secondary | ICD-10-CM

## 2016-04-25 DIAGNOSIS — I10 Essential (primary) hypertension: Secondary | ICD-10-CM | POA: Diagnosis not present

## 2016-04-25 MED ORDER — VARENICLINE TARTRATE 0.5 MG X 11 & 1 MG X 42 PO MISC: ORAL | 0 refills | Status: AC

## 2016-04-25 NOTE — Assessment & Plan Note (Addendum)
Intermittent Nicotine Dependence of 50 years duration in a patient who is good candidate for success b/c of motivation with recent hospitalizations, recent compliance with medical care. - Initiated varenicline tx. -  Patient counseled on purpose, proper use, and potential adverse effects, including GI upset, and potential change in mood.  - F/U phone call 05/09/2016.   - patient to follow up in our clinic in 1 month - quit date set to June 30 2016.

## 2016-04-25 NOTE — Patient Instructions (Addendum)
It was a pleasure seeing you today in our clinic. Today we discussed your recent hospitalization, blood pressure as well as quitting smoking. Here is the treatment plan we have discussed and agreed upon together:  1. High Blood Pressure Your BP is  Controlled today in the office Your goal is to have a BP average < 140/90 Medicine Changes: No changes today Homework: Please go to the pharmacy once per week to measure your blood pressure, and record this reading to bring into your next visit.  2. Tobacco - You have confidence and motivation to quit smoking, which is fantastic. We can provide resources to help you achieve this goal, which is the best thing you can do for your health.  - Please start the Chantix dose pack to help with tobacco cessation - I will call you in 2 weeks to see how your progress has been.  Your quit date is January 1, but we will work on cutting down until that point. Call 1800-QUIT-NOW for help with stopping smoking.   3. Recent hospitalization - You will follow up with the vascular surgeon on Monday - Please continue to take all of your medications each day as prescribed  4. Please come back and see [place] in one month or sooner as needed.  Our clinic's number is [phone]. Please call with questions or concerns about what we discussed today.  Be well, Dr. Mosetta Putt

## 2016-04-25 NOTE — Assessment & Plan Note (Signed)
-   Controlled at today's visit and at previous visit with cardiologist yesterday - continue current regimen lisinopril and coreg - will check again at next visit in 1 month

## 2016-04-28 ENCOUNTER — Encounter: Payer: Self-pay | Admitting: Surgery

## 2016-04-28 ENCOUNTER — Ambulatory Visit (INDEPENDENT_AMBULATORY_CARE_PROVIDER_SITE_OTHER): Payer: Medicaid Other | Admitting: Surgery

## 2016-04-28 VITALS — BP 106/66 | HR 68 | Temp 97.9°F | Resp 18 | Ht 70.0 in | Wt 189.0 lb

## 2016-04-28 DIAGNOSIS — I6521 Occlusion and stenosis of right carotid artery: Secondary | ICD-10-CM

## 2016-04-28 NOTE — Progress Notes (Signed)
HISTORY AND PHYSICAL     CC:  F/u for re-stenosis of right carotid artery Referring Provider:  Howard Pouch, MD  HPI: This is a 55 y.o. male who was admitted to the hospital earlier this month with syncope.  He states that he passed out and turned blue and his daughters called 911.  He says he had about 4 beers that day and then took his medicine but not sure if this is what caused him to pass out.  He also says he gets to coughing and will blackout and come right back.  He states his BP was sky high and the paramedics brought it down.  It was too low and he was transported to the hospital.   He underwent cardiac evaluation and was found to have no specific cardiac source.  He was seen by Dr. Arbie Cookey in consultation.  He does have hx of CABG (Dr. Laneta Simmers) on 4/14/ 2010 in conjunction with an open right carotid endarterectomy (Dr. Myra Gianotti) on 10/11/08.  On 01/13/13, he underwent exposure of left carotid artery and aborted left carotid endarterectomy due to calcification extending up to the skull base.  On 02/22/13, he underwent stenting of the left carotid artery.  During his last hospitalization, he was found to have a recurrent right carotid artery stenosis of greater than 80%.  He denies amaurosis fugax, hemiparesis or speech difficulty.  He does have lifestyle limiting claudication with the left leg worse than the right.  This has been going on for many years.  He does not have rest pain.  He states that he can walk about a city block before having to stop.  He has difficulty going to the grocery store or to the mall to walk.  He has to use his walking stick.  He denies any non healing wounds.  He states he has cut way down on his tobacco use and is only smoking about 3 cigarettes/week.  He also states he has cut way down on his alcohol intake.  States he might drink 2-3x/week whereas he was drinking every afternoon.  He may drink a 12 pack of beer in a week.  He only smokes when he drinks now.  He  recently has been prescribed Chantix and is going to pick up this prescription this afternoon.  He takes a statin for cholesterol management.  He take a beta blocker and ACEI for blood pressure management.  He states that ever since his lisinopril dose has been doubled, his blood pressure is much improved.    He does have a known AAA measuring 4.3cm on CTA in 2014.  He had an u/s in September 2017 and it was stable at 4.3cm.    Past Medical History:  Diagnosis Date  . AAA (abdominal aortic aneurysm) [facility]) July 2013  . Abnormal heart rhythm   . Anxiety   . CAD (coronary artery disease)   . Carotid artery occlusion   . GERD (gastroesophageal reflux disease)   . Heart attack 2010  . Hx of CABG 2010  . Hyperlipidemia   . Hypertension   . Irregular heartbeat   . Lung nodule   . Polysubstance abuse   . PVD (peripheral vascular disease) (HCC)   . Shortness of breath    when lying flat on back  . Stroke (HCC)   . Uncontrolled hypertension 02/02/2012    Past Surgical History:  Procedure Laterality Date  . ABDOMINAL AORTAGRAM N/A 04/19/2013   Procedure: ABDOMINAL AORTAGRAM;  Surgeon: Nada Libman,  MD;  Location: [facility] CATH LAB;  Service: Cardiovascular;  Laterality: N/A;  . CAROTID ENDARTERECTOMY Left 01-13-13   Attempted cea  . CAROTID STENT INSERTION Left 02/22/2013   Procedure: CAROTID STENT INSERTION;  Surgeon: Nada LibmanVance W Brabham, MD;  [facility];  Service: Cardiovascular;  Laterality: Left;  . CORONARY ARTERY BYPASS GRAFT  2010  . ENDARTERECTOMY Left 01/13/2013   Procedure: ATTEMPTED ENDARTERECTOMY CAROTID;  Surgeon: Nada LibmanVance W Brabham, MD;  [facility];  Service: Vascular;  Laterality: Left;    No Known Allergies  Current Outpatient Prescriptions  Medication Sig Dispense Refill  . aspirin EC 81 MG tablet Take 1 tablet (81 mg total) by mouth daily. 30 tablet 2  . atorvastatin (LIPITOR) 80 MG tablet Take 1 tablet (80 mg total) by mouth daily. 90 tablet 0  . carvedilol  (COREG) 12.5 MG tablet Take 1 tablet (12.5 mg total) by mouth 2 (two) times daily with a meal. 90 tablet 0  . clopidogrel (PLAVIX) 75 MG tablet Take 1 tablet (75 mg total) by mouth daily with breakfast. 30 tablet 0  . cyclobenzaprine (FLEXERIL) 10 MG tablet Take 1 tablet (10 mg total) by mouth 3 (three) times daily as needed for muscle spasms. 30 tablet 3  . lisinopril (PRINIVIL,ZESTRIL) 40 MG tablet Take 1 tablet (40 mg total) by mouth daily. 60 tablet 2  . ranitidine (ZANTAC) 300 MG tablet Take 1 tablet (300 mg total) by mouth daily. 60 tablet 0  . varenicline (CHANTIX STARTING MONTH PAK) 0.5 MG X 11 & 1 MG X 42 tablet Take one 0.5 mg tablet by mouth once daily for 3 days, then increase to one 0.5 mg tablet twice daily for 4 days, then increase to one 1 mg tablet twice daily. 53 tablet 0   No current facility-administered medications for this visit.     Family History  Problem Relation Age of Onset  . Asthma Mother   . Diabetes Mother   . Hyperlipidemia Mother   . Hypertension Mother   . Lung cancer Father   . Cancer Father   . Stroke Sister     Social History   Social History  . Marital status: Widowed    Spouse name: N/A  . Number of children: 1  . Years of education: 9th   Occupational History  . not working.     Social History Main Topics  . Smoking status: Former Smoker    Packs/day: 0.25    Years: 30.00    Types: Cigarettes    Quit date: 01/13/2013  . Smokeless tobacco: Never Used  . Alcohol use Yes     Comment: occasionally  . Drug use:     Types: Marijuana  . Sexual activity: Yes   Other Topics Concern  . Not on file   Social History Narrative   Pt is widowed and lives alone.   Patient is right handed   Patient drinks about 2cups of caffeine daily.           REVIEW OF SYSTEMS:   [X]  denotes positive finding, [ ]  denotes negative finding Cardiac  Comments:  Chest pain or chest pressure: x With deep breathing  Shortness of breath upon exertion: x     Short of breath when lying flat: x   Irregular heart rhythm:        Vascular    Pain in calf, thigh, or hip brought on by ambulation: x   Pain in feet at night that wakes you up  from your sleep:     Blood clot in your veins:    Leg swelling:         Pulmonary    Oxygen at home:    Productive cough:     Wheezing:         Neurologic    Sudden weakness in arms or legs:     Sudden numbness in arms or legs:     Sudden onset of difficulty speaking or slurred speech:    Temporary loss of vision in one eye:     Problems with dizziness:         Gastrointestinal    Blood in stool:     Vomited blood:         Genitourinary    Burning when urinating:     Blood in urine:        Psychiatric    Major depression:         Hematologic    Bleeding problems:    Problems with blood clotting too easily:        Skin    Rashes or ulcers:        Constitutional    Fever or chills:      PHYSICAL EXAMINATION:  Vitals:   04/28/16 1300 04/28/16 1303  BP: 120/80 106/66  Pulse: 78 68  Resp:  18  Temp:  97.9 F (36.6 C)   Body mass index is 27.12 kg/m.  General:  WDWN in NAD; vital signs documented above Gait: Not observed HENT: WNL, normocephalic Pulmonary: normal non-labored breathing , without Rales, rhonchi,  wheezing Cardiac: regular HR, without  Murmurs, rubs or gallops; with right carotid bruit Abdomen: soft, NT, no masses Skin: without rashes Vascular Exam/Pulses:  Right Left  Radial 2+ (normal) 2+ (normal)  Ulnar Unable to palpate  Unable to palpate   Femoral 1+ (weak) Unable to palpate   DP absent absent  PT Biphasic doppler signal Monophasic doppler signal  Peroneal Triphasic doppler signal Monophasic doppler signal   Extremities: without ischemic changes, without Gangrene , without cellulitis; without open wounds;  Musculoskeletal: no muscle wasting or atrophy  Neurologic: A&O X 3;  No focal weakness or paresthesias are detected Psychiatric:  The pt has  Normal affect.   Non-Invasive Vascular Imaging:   Carotid Duplex 04/14/16 (hospital): Summary: The right internal carotid artery origin exhibits elevated velocities suggestive of 80-99% stenosis.  The left internal carotid artery exhibits elevated velocities suggestive of upper range <50% stenosis versus low range >50% stenosis.  The right vertebral artery is patent with antegrade flow.  The left vertebral artery is patent with retrograde flow, suggestive of possible proximal obstruction.  Pt meds includes: Statin:  Yes.   Beta Blocker:  Yes Aspirin:  Yes ACEI:  Yes.   ARB:  No. Other Antiplatelet/Anticoagulant:  Yes.   Plavix   ASSESSMENT/PLAN:: 55 y.o. male with recurrent right carotid artery stenosis (asymptomatic)   -given the pt has had an open right carotid endarterectomy in 2010 in conjunction with CABG and the left sided plaque extended into the skull base and required a carotid stent, he will need to undergo right carotid artery stenting.  We will set this up for the next couple of weeks.  He will continue his Plavix and aspirin. -he will f/u in one year with an abdominal u/s for yearly surveillance of his AAA, which last measured 4.3cm and was stable. -we will also obtain ABI's in one year.  He does have lifestyle limiting claudication.  He has decreased the amount he smokes.  He will continue his walking program.  He has not had any non healing wounds.   Doreatha Massed, PA-C [facility] [phone]  Clinic MD:  Pt seen and examined in conjunction with Dr. Myra Gianotti.  I agree with the above.  I have seen and evaluated the patient.  Briefly, he presented to the hospital with a syncopal episode.  During this evaluation he was found to have a high-grade recurrent right carotid stenosis.  He has a hx of CABG (Dr. Laneta Simmers) on 4/14/ 2010 in conjunction with an open right carotid endarterectomy  on 10/11/08.  On 01/13/13, he underwent exposure of left  carotid artery and aborted left carotid endarterectomy due to calcification extending up to the skull base.  On 02/22/13, he underwent stenting of the left carotid artery.  Because of the recurrent nature of his stenosis, I will proceed with angiography and carotid stenting with distal embolic protection of the recurrent stenosis in the right common carotid artery.  The patient has previously undergone procedure in the left side.  He understands the risks and benefits as well as details of the procedure.  All questions were answered.  He is currently taking aspirin and Plavix.  The timing of the study will be coordinated and he'll be scheduled for a procedure within the next several weeks.  The patient also suffers from bilateral claudication.  His claudication symptoms will need to be evaluated once his carotid disease has been resolved.  Potentially he could get a lower extremity duplex studies with ABIs well in the hospital recovering from his carotid stent.    Durene Cal

## 2016-05-06 ENCOUNTER — Other Ambulatory Visit: Payer: Self-pay

## 2016-05-09 ENCOUNTER — Telehealth: Payer: Self-pay | Admitting: Student in an Organized Health Care Education/Training Program

## 2016-05-09 NOTE — Telephone Encounter (Signed)
Followed up with patient over the phone regarding quitting smoking.  - States he started the varencycline this week, has had no difficulty taking this medication. - States he has cut down tobacco use; smoked a total of 3 cigarettes this week - Notes his goal is to smoke 0 cigarettes next week  Otherwise doing well. Will follow up progress at next clinic visit. Quit date June 30, 2016.  Howard Pouch, MD

## 2016-05-12 ENCOUNTER — Other Ambulatory Visit: Payer: Self-pay | Admitting: Student in an Organized Health Care Education/Training Program

## 2016-05-12 DIAGNOSIS — I63019 Cerebral infarction due to thrombosis of unspecified vertebral artery: Secondary | ICD-10-CM

## 2016-05-28 ENCOUNTER — Ambulatory Visit (INDEPENDENT_AMBULATORY_CARE_PROVIDER_SITE_OTHER): Payer: Medicaid Other | Admitting: Student in an Organized Health Care Education/Training Program

## 2016-05-28 ENCOUNTER — Encounter: Payer: Self-pay | Admitting: Student in an Organized Health Care Education/Training Program

## 2016-05-28 DIAGNOSIS — I10 Essential (primary) hypertension: Secondary | ICD-10-CM | POA: Diagnosis not present

## 2016-05-28 DIAGNOSIS — E782 Mixed hyperlipidemia: Secondary | ICD-10-CM | POA: Diagnosis present

## 2016-05-28 MED ORDER — EZETIMIBE 10 MG PO TABS: 10.0000 mg | ORAL_TABLET | Freq: Every day | ORAL | 3 refills | Status: DC

## 2016-05-28 NOTE — Patient Instructions (Signed)
It was a pleasure seeing you today in our clinic. Today we discussed lifestyle changes and cholesterol. Here is the treatment plan we have discussed and agreed upon together:  - Make an appointment to see your vascular surgeon to discuss your concerns regarding sedation during your surgery. - You set the goal of switching to decaffeinated coffee, which will be good for your blood pressure. - Other ways to improve your health through lifestyle modifications include cutting back on red meat, cheese, cream, and butter, which are dietary sources of cholesterol.  Our clinic's number is [phone]. Please call with questions or concerns about what we discussed today.  Be well, Dr. Mosetta Putt

## 2016-05-28 NOTE — Progress Notes (Signed)
   CC: Check up  HPI: Randy [name] is a 55 y.o. male with a complicated PMH significant for HTN, PVD, AAA >4cm (stable 02/2016), history of CVA who presents for blood pressure follow up.  HLD - Pt endorses 100% compliance with atorva 80 mg - endorses anxiety over upcoming stent placement with vascular for R artery restenosis; concerned about going for procedure without sedation. Patient endorses considering canceling the surgery due to such severe anxiety about his sedation level during the surgery.  Previously he feels he was quite alert for his last procedure and this is the source of his anxiety. - no CP, dyspnea, palpitations, no light headedness  HTN - endorses taking BP but cannot recall exact measurements - endorses medication compliance 100% - floaters in eyes no longer present - patient states he feels well   Review of Symptoms:  See HPI for ROS.   CC, SH/smoking status, and VS noted.  Objective: BP 130/70   Pulse 77   Temp 98 F (36.7 C) (Oral)   Wt 191 lb 9.6 oz (86.9 kg)   SpO2 95%   BMI 27.49 kg/m  GEN: NAD, alert, cooperative, and pleasant RESPIRATORY: clear to auscultation bilaterally with no wheezes, rhonchi or rales, good effort CV: RRR, no m/r/g, no peripheral edema GI: soft, non-tender, non-distended, normoactive bowel sounds PSYCH: AAOx3, appropriate affect  Assessment and plan:  HLD (hyperlipidemia) - recently seen by vasc surgery; plan for carotid stenting with distal embolic protection of the recurrent stenosis in the right common carotid artery - per vascular pt will need LE duplex studies and ABIs for claudication after stenting  - recommend scheduling an appt w vascular to discuss sedation options during his carotid stenting, there may be options for him that can be discussed with the surgeon rather than just canceling the procedure out of anxiety - continue atorva 80, ASA, plavix - discussed lifestyle modifications (dietary changes in particular)  for lower cholesterol - patient still strongly plans to quit smoking Jun 30 2016 - added ezetamibe 10 mg qd for mixed hyperlipidemia still poorly controlled while on a statin  HTN (hypertension) Well-controlled this visit. 130/70 - continue lisinopril 40 mg qd, coreg 12.5 mg qd   No orders of the defined types were placed in this encounter.   Meds ordered this encounter  Medications  . ezetimibe (ZETIA) 10 MG tablet    Sig: Take 1 tablet (10 mg total) by mouth daily.    Dispense:  90 tablet    Refill:  3     Howard Pouch, MD,MS,  PGY1 05/28/2016 8:34 PM

## 2016-05-28 NOTE — Assessment & Plan Note (Signed)
Well-controlled this visit. 130/70 - continue lisinopril 40 mg qd, coreg 12.5 mg qd

## 2016-05-28 NOTE — Assessment & Plan Note (Addendum)
-   recently seen by vasc surgery; plan for carotid stenting with distal embolic protection of the recurrent stenosis in the right common carotid artery - per vascular pt will need LE duplex studies and ABIs for claudication after stenting  - recommend scheduling an appt w vascular to discuss sedation options during his carotid stenting, there may be options for him that can be discussed with the surgeon rather than just canceling the procedure out of anxiety - continue atorva 80, ASA, plavix - discussed lifestyle modifications (dietary changes in particular) for lower cholesterol - patient still strongly plans to quit smoking Jun 30 2016 - added ezetamibe 10 mg qd for mixed hyperlipidemia still poorly controlled while on a statin

## 2016-06-10 ENCOUNTER — Other Ambulatory Visit: Payer: Self-pay

## 2016-07-04 ENCOUNTER — Encounter (HOSPITAL_COMMUNITY): Payer: Self-pay | Admitting: Radiology

## 2016-07-21 ENCOUNTER — Telehealth: Payer: Self-pay

## 2016-07-21 NOTE — Telephone Encounter (Signed)
Patient's daughter, Allard Timperley, called stating that she needed to cancel Mr. Hays's procedure that is scheduled for tomorrow. Patient is scheduled for Right carotid stent with Drs. Brabham and Berry. Ms. Dorfman states that her father is out of town and will call the office to reschedule when he gets back. Notified Dr. Myra Gianotti then Dr. Hazle Coca office regarding cancellation. Spoke with Clydie Braun in [facility] to cancel patient's procedure.

## 2016-07-22 ENCOUNTER — Ambulatory Visit (HOSPITAL_COMMUNITY): Admission: RE | Admit: 2016-07-22 | Payer: Medicaid Other | Source: Ambulatory Visit | Admitting: Surgery

## 2016-07-22 ENCOUNTER — Encounter (HOSPITAL_COMMUNITY): Admission: RE | Payer: Self-pay | Source: Ambulatory Visit

## 2016-07-22 SURGERY — CAROTID PTA/STENT INTERVENTION
Anesthesia: LOCAL

## 2016-07-25 ENCOUNTER — Ambulatory Visit: Payer: Medicaid Other | Admitting: Cardiology

## 2016-07-30 ENCOUNTER — Encounter: Payer: Self-pay | Admitting: Cardiology

## 2016-08-01 ENCOUNTER — Telehealth: Payer: Self-pay | Admitting: Student in an Organized Health Care Education/Training Program

## 2016-08-01 ENCOUNTER — Other Ambulatory Visit: Payer: Self-pay | Admitting: Student in an Organized Health Care Education/Training Program

## 2016-08-01 DIAGNOSIS — I63019 Cerebral infarction due to thrombosis of unspecified vertebral artery: Secondary | ICD-10-CM

## 2016-08-01 MED ORDER — ATORVASTATIN CALCIUM 80 MG PO TABS: 80.0000 mg | ORAL_TABLET | Freq: Every day | ORAL | 0 refills | Status: DC

## 2016-08-01 MED ORDER — CARVEDILOL 12.5 MG PO TABS
12.5000 mg | ORAL_TABLET | Freq: Two times a day (BID) | ORAL | 0 refills | Status: DC
Start: 2016-08-01 — End: 2016-12-09

## 2016-08-01 NOTE — Telephone Encounter (Signed)
Please call the patient to inform him that his prescriptions for carvedilol and atorvastatin have been refilled at [place] [facility].   Thank you! LF

## 2016-08-01 NOTE — Telephone Encounter (Signed)
Needs refill on carvedilol and atorvastatin.  [facility] [address]

## 2016-08-01 NOTE — Telephone Encounter (Signed)
Pt informed of below. Zimmerman Rumple, Lakeidra Reliford D, CMA  

## 2016-09-09 ENCOUNTER — Telehealth: Payer: Self-pay | Admitting: *Deleted

## 2016-09-09 NOTE — Telephone Encounter (Signed)
LMTCB regarding rescheduling his Right carotid stent by Dr. Myra Gianotti and Dr. Allyson Sabal that was cancelled on 07-22-16. Case cancelled per Daughter, Herbert Seta, because patient was out of town. Will keep this surgery scheduling form in our pending file and will wait for a phone call from patient.

## 2016-09-21 ENCOUNTER — Other Ambulatory Visit: Payer: Self-pay | Admitting: Student in an Organized Health Care Education/Training Program

## 2016-10-23 ENCOUNTER — Ambulatory Visit (INDEPENDENT_AMBULATORY_CARE_PROVIDER_SITE_OTHER): Payer: Medicaid Other | Admitting: Internal Medicine

## 2016-10-23 ENCOUNTER — Encounter: Payer: Self-pay | Admitting: Internal Medicine

## 2016-10-23 DIAGNOSIS — I63019 Cerebral infarction due to thrombosis of unspecified vertebral artery: Secondary | ICD-10-CM | POA: Diagnosis not present

## 2016-10-23 DIAGNOSIS — M62838 Other muscle spasm: Secondary | ICD-10-CM

## 2016-10-23 MED ORDER — TRAMADOL HCL 50 MG PO TABS: 50.0000 mg | ORAL_TABLET | Freq: Three times a day (TID) | ORAL | 0 refills | Status: DC | PRN

## 2016-10-23 MED ORDER — TIZANIDINE HCL 2 MG PO CAPS: 2.0000 mg | ORAL_CAPSULE | Freq: Three times a day (TID) | ORAL | 0 refills | Status: DC

## 2016-10-23 MED ORDER — ATORVASTATIN CALCIUM 80 MG PO TABS: 80.0000 mg | ORAL_TABLET | Freq: Every day | ORAL | 0 refills | Status: DC

## 2016-10-23 MED ORDER — TIZANIDINE HCL 2 MG PO TABS: 2.0000 mg | ORAL_TABLET | Freq: Four times a day (QID) | ORAL | 0 refills | Status: DC | PRN

## 2016-10-23 NOTE — Progress Notes (Signed)
   [name] Randy Chars, MD Phone: [phone]  Reason For Visit: SDA of back pain   # Pain started on Monday morning when patient reached up to stop a bottle of deodorant from falling in the bathroom.  Patient now with constant lower back spasm. No radiation of pain. Patient has been taking ibuprofen 4 times a day.   Patient has tried ibuprofen and heating pad . Pain does not radiate  History of trauma or injury:none  Prior history of similar pain:yes  History of cancer:  Weak immune system: none  History of IV drug use: none History of steroid use: none   Symptoms Incontinence of bowel or bladder: none  Numbness of leg: none Fever: none Rest or Night pain:  - difficulty sleeping at night due to muscle spasm Weight Loss:  noen Rash: none   ROS see HPI Smoking Status noted.   Objective: BP 140/90   Pulse 69   Temp 97.7 F (36.5 C) (Oral)   Ht 5\' 10"  (1.778 m)   Wt 183 lb 6.4 oz (83.2 kg)   SpO2 98%   BMI 26.32 kg/m  Gen: NAD, alert, cooperative with exam Back: Lower left back with - Paraspinal muscle tenderness to palpation, pain with ROM, lower extremity strength and reflex intact, negative straight leg test  Extremities: warm, well perfused, No edema,  MSK: Ataxic gait Skin: dry, intact, no rashes or lesions Neuro: Strength and sensation grossly intact  Assessment/Plan: See problem based a/p  Muscle spasm Muscle spasm - emphasized given patient significant hx of CAD he should not be taking ibuprofen - patient stated he understood the risks. Changed muscle relaxents as no relief from flexor.  - Provided back exercises  - traMADol (ULTRAM) 50 MG tablet; Take 1 tablet (50 mg total) by mouth every 8 (eight) hours as needed.  Dispense: 30 tablet; Refill: 0 - tiZANidine (ZANAFLEX) 2 MG tablet; Take 1 tablet (2 mg total) by mouth every 6 (six) hours as needed for muscle spasms.  Dispense: 30 tablet; Refill: 0

## 2016-10-23 NOTE — Patient Instructions (Signed)
Please do not take ibuprofen. I watch to stop the Flexeril and we'll start you on tizanidine to see if that helps more. I will provide you with tramadol as a pain medication. You can also take Tylenol and use or heating pad to help with the muscle spasm. I would consider getting a back massage  Back Exercises If you have pain in your back, do these exercises 2-3 times each day or as told by your doctor. When the pain goes away, do the exercises once each day, but repeat the steps more times for each exercise (do more repetitions). If you do not have pain in your back, do these exercises once each day or as told by your doctor. Exercises Single Knee to Chest   Do these steps 3-5 times in a row for each leg: 1. Lie on your back on a firm bed or the floor with your legs stretched out. 2. Bring one knee to your chest. 3. Hold your knee to your chest by grabbing your knee or thigh. 4. Pull on your knee until you feel a gentle stretch in your lower back. 5. Keep doing the stretch for 10-30 seconds. 6. Slowly let go of your leg and straighten it. Pelvic Tilt   Do these steps 5-10 times in a row: 1. Lie on your back on a firm bed or the floor with your legs stretched out. 2. Bend your knees so they point up to the ceiling. Your feet should be flat on the floor. 3. Tighten your lower belly (abdomen) muscles to press your lower back against the floor. This will make your tailbone point up to the ceiling instead of pointing down to your feet or the floor. 4. Stay in this position for 5-10 seconds while you gently tighten your muscles and breathe evenly. Cat-Cow   Do these steps until your lower back bends more easily: 1. Get on your hands and knees on a firm surface. Keep your hands under your shoulders, and keep your knees under your hips. You may put padding under your knees. 2. Let your head hang down, and make your tailbone point down to the floor so your lower back is round like the back of a  cat. 3. Stay in this position for 5 seconds. 4. Slowly lift your head and make your tailbone point up to the ceiling so your back hangs low (sags) like the back of a cow. 5. Stay in this position for 5 seconds. Press-Ups   Do these steps 5-10 times in a row: 1. Lie on your belly (face-down) on the floor. 2. Place your hands near your head, about shoulder-width apart. 3. While you keep your back relaxed and keep your hips on the floor, slowly straighten your arms to raise the top half of your body and lift your shoulders. Do not use your back muscles. To make yourself more comfortable, you may change where you place your hands. 4. Stay in this position for 5 seconds. 5. Slowly return to lying flat on the floor. Bridges   Do these steps 10 times in a row: 1. Lie on your back on a firm surface. 2. Bend your knees so they point up to the ceiling. Your feet should be flat on the floor. 3. Tighten your butt muscles and lift your butt off of the floor until your waist is almost as high as your knees. If you do not feel the muscles working in your butt and the back of your thighs,  slide your feet 1-2 inches farther away from your butt. 4. Stay in this position for 3-5 seconds. 5. Slowly lower your butt to the floor, and let your butt muscles relax. If this exercise is too easy, try doing it with your arms crossed over your chest. Belly Crunches   Do these steps 5-10 times in a row: 1. Lie on your back on a firm bed or the floor with your legs stretched out. 2. Bend your knees so they point up to the ceiling. Your feet should be flat on the floor. 3. Cross your arms over your chest. 4. Tip your chin a little bit toward your chest but do not bend your neck. 5. Tighten your belly muscles and slowly raise your chest just enough to lift your shoulder blades a tiny bit off of the floor. 6. Slowly lower your chest and your head to the floor. Back Lifts  Do these steps 5-10 times in a row: 1. Lie on  your belly (face-down) with your arms at your sides, and rest your forehead on the floor. 2. Tighten the muscles in your legs and your butt. 3. Slowly lift your chest off of the floor while you keep your hips on the floor. Keep the back of your head in line with the curve in your back. Look at the floor while you do this. 4. Stay in this position for 3-5 seconds. 5. Slowly lower your chest and your face to the floor. Contact a doctor if:  Your back pain gets a lot worse when you do an exercise.  Your back pain does not lessen 2 hours after you exercise. If you have any of these problems, stop doing the exercises. Do not do them again unless your doctor says it is okay. Get help right away if:  You have sudden, very bad back pain. If this happens, stop doing the exercises. Do not do them again unless your doctor says it is okay. This information is not intended to replace advice given to you by your health care provider. Make sure you discuss any questions you have with your health care provider. Document Released: 07/19/2010 Document Revised: 11/22/2015 Document Reviewed: 08/10/2014 Elsevier Interactive Patient Education  2017 [employer].

## 2016-10-27 NOTE — Assessment & Plan Note (Signed)
Muscle spasm - emphasized given patient significant hx of CAD he should not be taking ibuprofen - patient stated he understood the risks. Changed muscle relaxents as no relief from flexor.  - Provided back exercises  - traMADol (ULTRAM) 50 MG tablet; Take 1 tablet (50 mg total) by mouth every 8 (eight) hours as needed.  Dispense: 30 tablet; Refill: 0 - tiZANidine (ZANAFLEX) 2 MG tablet; Take 1 tablet (2 mg total) by mouth every 6 (six) hours as needed for muscle spasms.  Dispense: 30 tablet; Refill: 0

## 2016-12-09 ENCOUNTER — Other Ambulatory Visit: Payer: Self-pay | Admitting: Student in an Organized Health Care Education/Training Program

## 2017-01-23 ENCOUNTER — Ambulatory Visit (INDEPENDENT_AMBULATORY_CARE_PROVIDER_SITE_OTHER): Payer: Medicaid Other | Admitting: Family Medicine

## 2017-01-23 ENCOUNTER — Encounter: Payer: Self-pay | Admitting: Family Medicine

## 2017-01-23 VITALS — BP 160/92 | HR 101 | Temp 102.1°F | Ht 70.0 in | Wt 187.0 lb

## 2017-01-23 DIAGNOSIS — L03115 Cellulitis of right lower limb: Secondary | ICD-10-CM | POA: Insufficient documentation

## 2017-01-23 DIAGNOSIS — I63019 Cerebral infarction due to thrombosis of unspecified vertebral artery: Secondary | ICD-10-CM | POA: Diagnosis not present

## 2017-01-23 DIAGNOSIS — I25810 Atherosclerosis of coronary artery bypass graft(s) without angina pectoris: Secondary | ICD-10-CM | POA: Diagnosis not present

## 2017-01-23 MED ORDER — CEPHALEXIN 500 MG PO CAPS: 500.0000 mg | ORAL_CAPSULE | Freq: Four times a day (QID) | ORAL | 0 refills | Status: AC

## 2017-01-23 MED ORDER — CLOPIDOGREL BISULFATE 75 MG PO TABS: 75.0000 mg | ORAL_TABLET | Freq: Every day | ORAL | 4 refills | Status: DC

## 2017-01-23 NOTE — Assessment & Plan Note (Signed)
Acute. Nonpurulent. Suspect secondary to insect bite though none identified. Associated pruritus and edema now progressing to systemic symptoms of fevers, chills, and night sweats. No tracking or abscess identified. --Keflex 500 mg 4 times daily for 5 days --Discussed reflex and recent study see urgent care over the weekend if not improved

## 2017-01-23 NOTE — Progress Notes (Signed)
   Subjective:   Patient ID: Randy [name]    DOB: 1960-07-13, 56 y.o. male   MRN: [id number]  CC: "Spider bite"  HPI: Randy [name] is a 56 y.o. male who presents to clinic today for SDA concerning spider bite. Problems discussed today are as follows:  Cellulitis: Onset 2 weeks ago when [place]. He felt a bite which proceeded to itch and progressively became swollen. Patient also endorses shortness of breath during the episode. Swelling has improved over the past week but he has developed fevers, chills, and night sweats. ROS: Denies dyspnea, cough, abdominal pain, rash, polyuria.  Complete ROS performed, see HPI for pertinent.  PMFSH: AAA, CABG, tobacco use disorder, GERD. Smoking status reviewed. Medications reviewed.  Objective:   BP (!) 160/92   Pulse (!) 101   Temp (!) 102.1 F (38.9 C) (Oral)   Ht 5\' 10"  (1.778 m)   Wt 187 lb (84.8 kg)   SpO2 93%   BMI 26.83 kg/m  Vitals and nursing note reviewed.  General: well nourished, well developed, in no acute distress with non-toxic appearance HEENT: normocephalic, atraumatic, moist mucous membranes Neck: supple, non-tender without lymphadenopathy CV: regular rate and rhythm without murmurs, rubs, or gallops, no lower extremity edema Lungs: clear to auscultation bilaterally with normal work of breathing Skin: warm, dry, cap refill < 2 seconds, solitary healed scab on right ankle anterior surface with violaceous surrounding and associated calor with mild tenderness on palpation without tracking or crepitus Extremities: warm and well perfused, normal tone      Assessment & Plan:   Cellulitis of right ankle Acute. Nonpurulent. Suspect secondary to insect bite though none identified. Associated pruritus and edema now progressing to systemic symptoms of fevers, chills, and night sweats. No tracking or abscess identified. --Keflex 500 mg 4 times daily for 5 days --Discussed reflex and recent study see  urgent care over the weekend if not improved  CAD, ARTERY BYPASS GRAFT Chronic. On Plavix with stent placement. Having difficulty getting protrusion refill. --Plavix refill placed, patient to call clinic if unable to obtain medication today  No orders of the defined types were placed in this encounter.  Meds ordered this encounter  Medications  . cephALEXin (KEFLEX) 500 MG capsule    Sig: Take 1 capsule (500 mg total) by mouth 4 (four) times daily.    Dispense:  20 capsule    Refill:  0  . clopidogrel (PLAVIX) 75 MG tablet    Sig: Take 1 tablet (75 mg total) by mouth daily with breakfast.    Dispense:  90 tablet    Refill:  4    Please consider 90 day supplies to promote better adherence    Durward Parcel, DO [facility], PGY-2 01/23/2017 2:57 PM

## 2017-01-23 NOTE — Patient Instructions (Signed)
Thank you for coming in to see [place] today. Please see below to review our plan for today's visit.  You likely were bit by an insect which has now progressed to a soft tissue skin infection called Cellulitis. I have prescribed to a medication called Keflex which she will take 4 times daily for 5 days. If your symptoms do not improve by Sunday, please go to the urgent care given the weekend.  Return to clinic in one week if not improved.  Please call the clinic at [phone] if your symptoms worsen or you have any concerns. It was my pleasure to see you. -- Durward Parcel, DO [facility], PGY-2  Cephalexin oral suspension What is this medicine? CEPHALEXIN (sef a LEX in) is a cephalosporin antibiotic. It is used to treat certain kinds of bacterial infections.It will not work for colds, flu, or other viral infections. This medicine may be used for other purposes; ask your health care provider or pharmacist if you have questions. COMMON BRAND NAME(S): Biocef, Keflex, Panixine What should I tell my health care provider before I take this medicine? They need to know if you have any of these conditions: -kidney disease -stomach or intestine problems, especially colitis -an unusual or allergic reaction to cephalexin, other cephalosporins, penicillins, other antibiotics, medicines, foods, dyes or preservatives -pregnant or trying to get pregnant -breast-feeding How should I use this medicine? Take this medicine by mouth. Follow the directions on your prescription label. Shake well before using. Use a specially marked spoon or container to measure your medicine. Ask your pharmacist if you do not have one. Household spoons are not accurate. You can take this medicine with food or on an empty stomach. If the medicine upsets your stomach, take it with food. Do not take your medicine more often than directed. Finish the full course prescribed by your doctor or health care professional even  if you think your condition is better. Talk to your pediatrician regarding the use of this medicine in children. While this drug may be prescribed for selected conditions, precautions do apply. Overdosage: If you think you have taken too much of this medicine contact a poison control center or emergency room at once. NOTE: This medicine is only for you. Do not share this medicine with others. What if I miss a dose? If you miss a dose, take it as soon as you can. If it is almost time for your next dose, take only that dose. Do not take double or extra doses. There should be at least 4 to 6 hours between doses. What may interact with this medicine? -probenecid -some other antibiotics This list may not describe all possible interactions. Give your health care provider a list of all the medicines, herbs, non-prescription drugs, or dietary supplements you use. Also tell them if you smoke, drink alcohol, or use illegal drugs. Some items may interact with your medicine. What should I watch for while using this medicine? Tell your doctor or health care professional if your symptoms do not begin to improve in a few days. Do not treat diarrhea with over the counter products. Contact your doctor if you have diarrhea that lasts more than 2 days or if it is severe and watery. If you have diabetes, you may get a false-positive result for sugar in your urine. Check with your doctor or health care professional. What side effects may I notice from receiving this medicine? Side effects that you should report to your doctor or health  care professional as soon as possible: -allergic reactions like skin rash, itching or hives, swelling of the face, lips, or tongue -breathing problems -pain or difficulty passing urine -redness, blistering, peeling or loosening of the skin, including inside the mouth -severe or watery diarrhea -unusually weak or tired -yellowing of the eyes, skin Side effects that usually do not  require medical attention (report to your doctor or health care professional if they continue or are bothersome): -gas or heartburn -genital or anal irritation -headache -joint or muscle pain -nausea, vomiting This list may not describe all possible side effects. Call your doctor for medical advice about side effects. You may report side effects to FDA at 1-800-FDA-1088. Where should I keep my medicine? Keep out of the reach of children. After this medicine is mixed by your pharmacist, store it in the refrigerator. Do not freeze. Throw away any unused medicine after 14 days. NOTE: This sheet is a summary. It may not cover all possible information. If you have questions about this medicine, talk to your doctor, pharmacist, or health care provider.  2018 Elsevier/Gold Standard (2007-09-20 17:10:55)   Cellulitis, Adult Cellulitis is a skin infection. The infected area is usually red and sore. This condition occurs most often in the arms and lower legs. It is very important to get treated for this condition. Follow these instructions at home:  Take over-the-counter and prescription medicines only as told by your doctor.  If you were prescribed an antibiotic medicine, take it as told by your doctor. Do not stop taking the antibiotic even if you start to feel better.  Drink enough fluid to keep your pee (urine) clear or pale yellow.  Do not touch or rub the infected area.  Raise (elevate) the infected area above the level of your heart while you are sitting or lying down.  Place warm or cold wet cloths (warm or cold compresses) on the infected area. Do this as told by your doctor.  Keep all follow-up visits as told by your doctor. This is important. These visits let your doctor make sure your infection is not getting worse. Contact a doctor if:  You have a fever.  Your symptoms do not get better after 1-2 days of treatment.  Your bone or joint under the infected area starts to hurt  after the skin has healed.  Your infection comes back. This can happen in the same area or another area.  You have a swollen bump in the infected area.  You have new symptoms.  You feel ill and also have muscle aches and pains. Get help right away if:  Your symptoms get worse.  You feel very sleepy.  You throw up (vomit) or have watery poop (diarrhea) for a long time.  There are red streaks coming from the infected area.  Your red area gets larger.  Your red area turns darker. This information is not intended to replace advice given to you by your health care provider. Make sure you discuss any questions you have with your health care provider. Document Released: 12/03/2007 Document Revised: 11/22/2015 Document Reviewed: 04/25/2015 Elsevier Interactive Patient Education  2018 [employer].

## 2017-01-23 NOTE — Assessment & Plan Note (Signed)
Chronic. On Plavix with stent placement. Having difficulty getting protrusion refill. --Plavix refill placed, patient to call clinic if unable to obtain medication today

## 2017-03-23 ENCOUNTER — Ambulatory Visit (INDEPENDENT_AMBULATORY_CARE_PROVIDER_SITE_OTHER): Payer: Medicaid Other | Admitting: Family Medicine

## 2017-03-23 DIAGNOSIS — R0600 Dyspnea, unspecified: Secondary | ICD-10-CM | POA: Insufficient documentation

## 2017-03-23 NOTE — Assessment & Plan Note (Signed)
Patient left without being seen.  No specific diagnosis made.  Shortness of breath was the CC given to the triage nurse.

## 2017-03-23 NOTE — Progress Notes (Signed)
Patient walked in and was triaged by the nurse.  Then left without being seen.

## 2017-03-30 ENCOUNTER — Encounter (HOSPITAL_COMMUNITY): Payer: Self-pay | Admitting: *Deleted

## 2017-03-30 ENCOUNTER — Inpatient Hospital Stay (HOSPITAL_COMMUNITY)
Admission: EM | Admit: 2017-03-30 | Discharge: 2017-04-01 | DRG: 292 | Disposition: A | Discharge status: Home / Self Care | Payer: Medicaid Other | Attending: Family Medicine | Admitting: Family Medicine

## 2017-03-30 ENCOUNTER — Emergency Department (HOSPITAL_COMMUNITY): Payer: Medicaid Other

## 2017-03-30 DIAGNOSIS — K5900 Constipation, unspecified: Secondary | ICD-10-CM | POA: Diagnosis present

## 2017-03-30 DIAGNOSIS — Z9114 Patient's other noncompliance with medication regimen: Secondary | ICD-10-CM | POA: Diagnosis not present

## 2017-03-30 DIAGNOSIS — K219 Gastro-esophageal reflux disease without esophagitis: Secondary | ICD-10-CM | POA: Diagnosis present

## 2017-03-30 DIAGNOSIS — I110 Hypertensive heart disease with heart failure: Principal | ICD-10-CM | POA: Diagnosis present

## 2017-03-30 DIAGNOSIS — R0602 Shortness of breath: Secondary | ICD-10-CM

## 2017-03-30 DIAGNOSIS — Z8673 Personal history of transient ischemic attack (TIA), and cerebral infarction without residual deficits: Secondary | ICD-10-CM | POA: Diagnosis not present

## 2017-03-30 DIAGNOSIS — N179 Acute kidney failure, unspecified: Secondary | ICD-10-CM | POA: Diagnosis present

## 2017-03-30 DIAGNOSIS — I5041 Acute combined systolic (congestive) and diastolic (congestive) heart failure: Secondary | ICD-10-CM | POA: Diagnosis present

## 2017-03-30 DIAGNOSIS — Z95828 Presence of other vascular implants and grafts: Secondary | ICD-10-CM

## 2017-03-30 DIAGNOSIS — F1010 Alcohol abuse, uncomplicated: Secondary | ICD-10-CM | POA: Diagnosis present

## 2017-03-30 DIAGNOSIS — I714 Abdominal aortic aneurysm, without rupture: Secondary | ICD-10-CM | POA: Diagnosis present

## 2017-03-30 DIAGNOSIS — I2510 Atherosclerotic heart disease of native coronary artery without angina pectoris: Secondary | ICD-10-CM | POA: Diagnosis present

## 2017-03-30 DIAGNOSIS — I252 Old myocardial infarction: Secondary | ICD-10-CM | POA: Diagnosis not present

## 2017-03-30 DIAGNOSIS — Z7982 Long term (current) use of aspirin: Secondary | ICD-10-CM | POA: Diagnosis not present

## 2017-03-30 DIAGNOSIS — Z87891 Personal history of nicotine dependence: Secondary | ICD-10-CM

## 2017-03-30 DIAGNOSIS — Z7902 Long term (current) use of antithrombotics/antiplatelets: Secondary | ICD-10-CM

## 2017-03-30 DIAGNOSIS — E785 Hyperlipidemia, unspecified: Secondary | ICD-10-CM | POA: Diagnosis present

## 2017-03-30 DIAGNOSIS — Z79899 Other long term (current) drug therapy: Secondary | ICD-10-CM

## 2017-03-30 DIAGNOSIS — Z951 Presence of aortocoronary bypass graft: Secondary | ICD-10-CM | POA: Diagnosis not present

## 2017-03-30 DIAGNOSIS — R0600 Dyspnea, unspecified: Secondary | ICD-10-CM | POA: Diagnosis not present

## 2017-03-30 DIAGNOSIS — I509 Heart failure, unspecified: Secondary | ICD-10-CM | POA: Diagnosis not present

## 2017-03-30 DIAGNOSIS — Z8249 Family history of ischemic heart disease and other diseases of the circulatory system: Secondary | ICD-10-CM

## 2017-03-30 DIAGNOSIS — I6523 Occlusion and stenosis of bilateral carotid arteries: Secondary | ICD-10-CM | POA: Diagnosis not present

## 2017-03-30 DIAGNOSIS — I739 Peripheral vascular disease, unspecified: Secondary | ICD-10-CM | POA: Diagnosis present

## 2017-03-30 DIAGNOSIS — I25810 Atherosclerosis of coronary artery bypass graft(s) without angina pectoris: Secondary | ICD-10-CM | POA: Diagnosis not present

## 2017-03-30 DIAGNOSIS — R0601 Orthopnea: Secondary | ICD-10-CM | POA: Diagnosis not present

## 2017-03-30 HISTORY — DX: Heart failure, unspecified: I50.9

## 2017-03-30 LAB — BASIC METABOLIC PANEL
Anion gap: 9 (ref 5–15)
BUN: 13 mg/dL (ref 6–20)
CO2: 26 mmol/L (ref 22–32)
CREATININE: 1.15 mg/dL (ref 0.61–1.24)
Calcium: 8.6 mg/dL — ABNORMAL LOW (ref 8.9–10.3)
Chloride: 101 mmol/L (ref 101–111)
Glucose, Bld: 106 mg/dL — ABNORMAL HIGH (ref 65–99)
POTASSIUM: 3.9 mmol/L (ref 3.5–5.1)
SODIUM: 136 mmol/L (ref 135–145)

## 2017-03-30 LAB — CBC
HCT: 42.6 % (ref 39.0–52.0)
Hemoglobin: 13.4 g/dL (ref 13.0–17.0)
MCH: 27.4 pg (ref 26.0–34.0)
MCHC: 31.5 g/dL (ref 30.0–36.0)
MCV: 87.1 fL (ref 78.0–100.0)
PLATELETS: 252 10*3/uL (ref 150–400)
RBC: 4.89 MIL/uL (ref 4.22–5.81)
RDW: 14.5 % (ref 11.5–15.5)
WBC: 7.4 10*3/uL (ref 4.0–10.5)

## 2017-03-30 LAB — I-STAT VENOUS BLOOD GAS, ED
Bicarbonate: 24.2 mmol/L (ref 20.0–28.0)
O2 SAT: 95 %
TCO2: 25 mmol/L (ref 22–32)
pCO2, Ven: 37.7 mmHg — ABNORMAL LOW (ref 44.0–60.0)
pH, Ven: 7.414 (ref 7.250–7.430)
pO2, Ven: 74 mmHg — ABNORMAL HIGH (ref 32.0–45.0)

## 2017-03-30 LAB — PROTIME-INR
INR: 1.17
PROTHROMBIN TIME: 14.9 s (ref 11.4–15.2)

## 2017-03-30 LAB — I-STAT TROPONIN, ED: TROPONIN I, POC: 0.03 ng/mL (ref 0.00–0.08)

## 2017-03-30 LAB — D-DIMER, QUANTITATIVE (NOT AT ARMC): D DIMER QUANT: 0.55 ug{FEU}/mL — AB (ref 0.00–0.50)

## 2017-03-30 LAB — BRAIN NATRIURETIC PEPTIDE: B Natriuretic Peptide: 1587.1 pg/mL — ABNORMAL HIGH (ref 0.0–100.0)

## 2017-03-30 LAB — I-STAT CG4 LACTIC ACID, ED: Lactic Acid, Venous: 1.24 mmol/L (ref 0.5–1.9)

## 2017-03-30 MED ORDER — SODIUM CHLORIDE 0.9% FLUSH: 3.0000 mL | INTRAVENOUS | Status: DC | PRN

## 2017-03-30 MED ORDER — EZETIMIBE 10 MG PO TABS
10.0000 mg | ORAL_TABLET | Freq: Every day | ORAL | Status: DC
  Administered 2017-03-30 – 2017-04-01 (×3): 10 mg via ORAL
  Filled 2017-03-30 (×3): qty 1

## 2017-03-30 MED ORDER — SODIUM CHLORIDE 0.9% FLUSH
3.0000 mL | Freq: Two times a day (BID) | INTRAVENOUS | Status: DC
  Administered 2017-03-30 – 2017-04-01 (×4): 3 mL via INTRAVENOUS

## 2017-03-30 MED ORDER — CLOPIDOGREL BISULFATE 75 MG PO TABS
75.0000 mg | ORAL_TABLET | Freq: Every day | ORAL | Status: DC
  Administered 2017-03-31 – 2017-04-01 (×2): 75 mg via ORAL
  Filled 2017-03-30 (×2): qty 1

## 2017-03-30 MED ORDER — LISINOPRIL 40 MG PO TABS
40.0000 mg | ORAL_TABLET | Freq: Every day | ORAL | Status: DC
  Administered 2017-03-30 – 2017-03-31 (×2): 40 mg via ORAL
  Filled 2017-03-30: qty 2
  Filled 2017-03-30: qty 1

## 2017-03-30 MED ORDER — FUROSEMIDE 10 MG/ML IJ SOLN
40.0000 mg | Freq: Once | INTRAMUSCULAR | Status: AC
  Administered 2017-03-30: 40 mg via INTRAVENOUS
  Filled 2017-03-30: qty 4

## 2017-03-30 MED ORDER — IPRATROPIUM-ALBUTEROL 0.5-2.5 (3) MG/3ML IN SOLN
3.0000 mL | Freq: Once | RESPIRATORY_TRACT | Status: AC
  Administered 2017-03-30: 3 mL via RESPIRATORY_TRACT
  Filled 2017-03-30: qty 3

## 2017-03-30 MED ORDER — ORAL CARE MOUTH RINSE
15.0000 mL | Freq: Two times a day (BID) | OROMUCOSAL | Status: DC
  Administered 2017-03-31 – 2017-04-01 (×2): 15 mL via OROMUCOSAL

## 2017-03-30 MED ORDER — ASPIRIN EC 81 MG PO TBEC
81.0000 mg | DELAYED_RELEASE_TABLET | Freq: Every day | ORAL | Status: DC
  Administered 2017-03-30 – 2017-04-01 (×3): 81 mg via ORAL
  Filled 2017-03-30 (×3): qty 1

## 2017-03-30 MED ORDER — ATORVASTATIN CALCIUM 80 MG PO TABS
80.0000 mg | ORAL_TABLET | Freq: Every day | ORAL | Status: DC
  Administered 2017-03-30 – 2017-04-01 (×3): 80 mg via ORAL
  Filled 2017-03-30 (×3): qty 1

## 2017-03-30 MED ORDER — SODIUM CHLORIDE 0.9 % IV SOLN: 250.0000 mL | INTRAVENOUS | Status: DC | PRN

## 2017-03-30 MED ORDER — LORAZEPAM 1 MG PO TABS: 1.0000 mg | ORAL_TABLET | Freq: Four times a day (QID) | ORAL | Status: DC | PRN

## 2017-03-30 MED ORDER — ACETAMINOPHEN 325 MG PO TABS: 650.0000 mg | ORAL_TABLET | ORAL | Status: DC | PRN

## 2017-03-30 MED ORDER — LORAZEPAM 2 MG/ML IJ SOLN: 1.0000 mg | Freq: Four times a day (QID) | INTRAMUSCULAR | Status: DC | PRN

## 2017-03-30 MED ORDER — CARVEDILOL 12.5 MG PO TABS
12.5000 mg | ORAL_TABLET | Freq: Two times a day (BID) | ORAL | Status: DC
  Administered 2017-03-30 – 2017-04-01 (×4): 12.5 mg via ORAL
  Filled 2017-03-30 (×4): qty 1

## 2017-03-30 MED ORDER — FUROSEMIDE 10 MG/ML IJ SOLN
40.0000 mg | Freq: Two times a day (BID) | INTRAMUSCULAR | Status: AC
  Administered 2017-03-30 – 2017-03-31 (×2): 40 mg via INTRAVENOUS
  Filled 2017-03-30 (×2): qty 4

## 2017-03-30 MED ORDER — FAMOTIDINE 20 MG PO TABS
40.0000 mg | ORAL_TABLET | Freq: Every day | ORAL | Status: DC
  Administered 2017-03-30 – 2017-03-31 (×2): 40 mg via ORAL
  Filled 2017-03-30 (×2): qty 2

## 2017-03-30 MED ORDER — ONDANSETRON HCL 4 MG/2ML IJ SOLN
4.0000 mg | Freq: Four times a day (QID) | INTRAMUSCULAR | Status: DC | PRN
  Administered 2017-03-30: 4 mg via INTRAVENOUS
  Filled 2017-03-30: qty 2

## 2017-03-30 NOTE — H&P (Signed)
Family Medicine Teaching [facility] Admission History and Physical Service Pager: [phone]  Patient name: Randy [name] Medical record number: [id number] Date of birth: March 24, 1961 Age: 55 y.o. Gender: male  Primary Care Provider: Howard Pouch, MD Consultants: none Code Status: full   Chief Complaint: shortness of breath   Assessment and Plan: Randy [name] is a 56 y.o. male presenting with SOB x 1 week . PMH is significant for MI, CAD, stroke, HLD, HTN, tobacco abuse, AAA without rupture, GERD, and PVD.  SOB, acute, worsening Likely 2/2 CHF exacerbation given increased orthopnea, PND, and CXR findings of pulmonary edema and bilateral effusions. BNP elevated to 1587.1. Echocardiogram on 03/2016 showing LVEF of 55%. Cannot rule out PE given positive d-dimer, although negative when age adjusted, and physical exam finding of slightly more edematous right leg although less likely given gradual worsening of symptoms and home anticoagulation regimen. Normal pH on venous sampling making acidosis less likely cause. Less likely infectious given lack of fever, leukocytosis and normal lactic acid. istat troponin negative, so less likely cardiac in origin. Also part of our differential would be COPD given patient extensive history of smoking.  Currently O2 saturation of 96% on room air.  -admit to FPTS, attending physican Dr. Jennette Kettle  -continuous pulse ox  -continued diuresis with IV lasix 40 mg bid  -consider CTA if no improvement  -continue cardiac monitoring  -Repeat echocardiogram -Daily weights -Monitor I/O  -Oxygen therapy as needed, wean as tolerated -encouraged low salt diet   CAD w/artery bypass graft History of CABG 10/11/2008 and open endarterectomy, left and right  carotid artery stenting. Patient also has a history of AAA measuring 4.3 (02/2016) unchanged from 2014. -continue aspirin, carvedilol, plavix.  Hyperlipidemia Patient reports that he is out of his atorvastatin. Given  extensive vascular and cardiac disease will ensure patient receive meds. -Continue atorvastatin and zetia.  HTN On admission BP 180/112. Patient had not taken BP meds for the day. -continue Lisinopril 40 mg daily  GERD Home ranitidine. -continue home medications  Alcohol Abuse Patient drinks 4-5 beers a day typically, but has decreased his intake in the past 2 months. Patient states last drink was on 9/29 with 1 beer. Unsure if patient can be trusted, given history of significant daily alcohol consumption will be cautious. No history of DT or severe withdrawal -CIWA protocol   Tobacco Abuse  Patient reports quitting 2 months ago.  -no need for nicotine patch at this time. Can re-consider per patient preference.   FEN/GI: heart healthy diet, encouraged low salt Prophylaxis: SCDs ( on plavix and aspirin)  Disposition: Admit to telemetry, attending Dr. Jennette Kettle  History of Present Illness:   Randy [name] is a 56 y.o. male presenting with SOB x 1 week . PMH is significant for MI, CAD, stroke, HLD, HTN, tobacco abuse, AAA without rupture, GERD, and PVD. He denies hx of asthma or COPD. Patient notes symptoms began last Wednesday and have progressively worsened in severity. Since that time, patient reports difficulty lying flat with associated cough and difficulty sleeping when lying down. He states his "cough would wake" him at night and has increased from using 2 pillows at night to now sitting up at using a minimum of 4 pillows. Patient states he has slept more in his recliner recently. Patient states he has difficulty with ambulation over 50 feet. Patient reports he used Tylenol PM and Vick's cough medicine which has helped some.   Patient woke up this morning with his symptoms exacerbated, he reports  developing productive cough with "dirty yellow" sputum. Patient attempted to see PCP on 9/24 for SOB, but left without being seen. Worsening SOB brought him to the ED today. Patient has never had an  incident similar to this in the past.   Patient denies swelling in legs, dizziness, chest pain, lightheadedness, nausea, vomiting, diarrhea, hematuria, pain with ambulation, pain in arms or legs, difficulty voiding or any other associated symptom or complaints. Of note, patient reports constipation for the past weekend. Patient takes all his medication as prescribed, except for today. He did not take his medications today.   Patient reports he quit smoking 2 months ago, previously smoked <1ppd since age 38. Patient drinks 4-5 beers a day typically, but has decreased his intake in the past 2 months. His last drink was 9/29 with 1 beer.  In ED patient received CXR showing pulmonary edema and bilateral effusions. Patient was given 1 duoneb treatment and 40 mg IV lasix.   Review Of Systems: Per HPI with the following additions:   Review of Systems  Respiratory: Positive for cough, sputum production and shortness of breath.   Cardiovascular: Positive for chest pain, orthopnea, leg swelling and PND.  Gastrointestinal: Positive for constipation.    Patient Active Problem List   Diagnosis Date Noted  . Acute congestive heart failure (HCC)   . Dyspnea 03/23/2017  . Cellulitis of right ankle 01/23/2017  . Muscle spasm 10/23/2016  . Insomnia 04/11/2016  . Orthopnea 03/31/2016  . 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 HTN (hypertension) 06/15/2014  . HLD (hyperlipidemia) 04/05/2014  . PVD (peripheral vascular disease) with claudication (HCC) 05/09/2013  . GERD (gastroesophageal reflux disease) 03/28/2013  . AAA (abdominal aortic aneurysm) without rupture (HCC) 10/28/2012  . Anxiety 02/12/2012  . Tobacco abuse 02/02/2012  . Occlusion and stenosis of carotid artery without mention of cerebral infarction 01/19/2012  . Pulmonary nodule 05/19/2011  . CAD, ARTERY BYPASS GRAFT 10/19/2008  . DISC DISEASE, LUMBAR 10/19/2008    Past Medical History: Past  Medical History:  Diagnosis Date  . AAA (abdominal aortic aneurysm) [facility] July 2013  . Abnormal heart rhythm   . Anxiety   . CAD (coronary artery disease)   . Carotid artery occlusion   . CHF (congestive heart failure) (HCC)   . GERD (gastroesophageal reflux disease)   . Heart attack (HCC) 2010  . Hx of CABG 2010  . Hyperlipidemia   . Hypertension   . Irregular heartbeat   . Lung nodule   . Polysubstance abuse (HCC)   . PVD (peripheral vascular disease) (HCC)   . Shortness of breath    when lying flat on back  . Stroke (HCC)   . Uncontrolled hypertension 02/02/2012    Past Surgical History: Past Surgical History:  Procedure Laterality Date  . ABDOMINAL AORTAGRAM N/A 04/19/2013   Procedure: ABDOMINAL AORTAGRAM;  Surgeon: Nada Libman, MD;  Location: [facility] CATH LAB;  Service: Cardiovascular;  Laterality: N/A;  . CAROTID ENDARTERECTOMY Left 01-13-13   Attempted cea  . CAROTID STENT INSERTION Left 02/22/2013   Procedure: CAROTID STENT INSERTION;  Surgeon: Nada Libman, MD;  Location: [facility] CATH LAB;  Service: Cardiovascular;  Laterality: Left;  . CORONARY ARTERY BYPASS GRAFT  2010  . ENDARTERECTOMY Left 01/13/2013   Procedure: ATTEMPTED ENDARTERECTOMY CAROTID;  Surgeon: Nada Libman, MD;  Location[facility] OR;  Service: Vascular;  Laterality: Left;    Social History: Social History  Substance Use Topics  . Smoking status:  Former Smoker    Packs/day: 0.50    Years: 30.00    Types: Cigarettes    Quit date: 01/28/2017  . Smokeless tobacco: Never Used  . Alcohol use 3.0 oz/week    5 Cans of beer per week     Comment: occasionally   Additional social history:  Patient reports he quit smoking 2 months ago, previously smoked <1ppd since age 19.  Patient drinks 4-5 beers a day typically, but has decreased his intake in the past 2 months. His last drink was 9/29 with 1 beer.  Please also refer to relevant sections of EMR.  Family History: Family History  Problem Relation Age of  Onset  . Asthma Mother   . Diabetes Mother   . Hyperlipidemia Mother   . Hypertension Mother   . Lung cancer Father   . Cancer Father   . Stroke Sister     Allergies and Medications: No Known Allergies No current facility-administered medications on file prior to encounter.    Current Outpatient Prescriptions on File Prior to Encounter  Medication Sig Dispense Refill  . aspirin EC 81 MG tablet Take 1 tablet (81 mg total) by mouth daily. 30 tablet 2  . atorvastatin (LIPITOR) 80 MG tablet Take 1 tablet (80 mg total) by mouth daily. 90 tablet 0  . carvedilol (COREG) 12.5 MG tablet TAKE ONE TABLET BY MOUTH TWICE DAILY WITH A MEAL 180 tablet 0  . clopidogrel (PLAVIX) 75 MG tablet Take 1 tablet (75 mg total) by mouth daily with breakfast. 90 tablet 4  . ezetimibe (ZETIA) 10 MG tablet Take 1 tablet (10 mg total) by mouth daily. 90 tablet 3  . lisinopril (PRINIVIL,ZESTRIL) 40 MG tablet Take 1 tablet (40 mg total) by mouth daily. 60 tablet 2  . ranitidine (ZANTAC) 300 MG tablet Take 1 tablet (300 mg total) by mouth daily. 60 tablet 0    Objective: BP (!) 174/116   Pulse (!) 107   Temp 98.2 F (36.8 C) (Oral)   Resp (!) 22   SpO2 94%  Exam: General: awake and alert, sitting up in bed, Mathews in place Eyes: PERRL, no conjunctival pallor ENTM: oropharynx clear without erythema or tonsillar exudate  Cardiovascular: RRR, no MRG Respiratory: distant breath sounds, very slight crackles auscultated at bases bilaterally  Gastrointestinal: soft, non tender, non distended, bowel sounds x 4 quadrants  MSK: slight discoloration/hyperpigmentation in right leg, non tender, negative homan's sign bilaterally  Derm: no rashes noted, warm, dry, intact  Neuro: alert, no focal neuro deficit  Psych: normal affect   Labs and Imaging: CBC BMET   Recent Labs Lab 03/30/17 1105  WBC 7.4  HGB 13.4  HCT 42.6  PLT 252    Recent Labs Lab 03/30/17 1105  NA 136  K 3.9  CL 101  CO2 26  BUN 13   CREATININE 1.15  GLUCOSE 106*  CALCIUM 8.6*      Ref. Range 03/30/2017 11:59  Sample type Unknown VENOUS  pH, Ven Latest Ref Range: 7.250 - 7.430  7.414  pCO2, Ven Latest Ref Range: 44.0 - 60.0 mmHg 37.7 (L)  pO2, Ven Latest Ref Range: 32.0 - 45.0 mmHg 74.0 (H)  TCO2 Latest Ref Range: 22 - 32 mmol/L 25  Bicarbonate Latest Ref Range: 20.0 - 28.0 mmol/L 24.2  O2 Saturation Latest Units: % 95.0    Ref. Range 03/30/2017 11:59  Lactic Acid, Venous Latest Ref Range: 0.5 - 1.9 mmol/L 1.24    Ref. Range 03/30/2017 11:42  D-Dimer, Quant Latest Ref Range: 0.00 - 0.50 ug/mL-FEU 0.55 (H)   Dg Chest 2 View  Result Date: 03/30/2017 CLINICAL DATA:  Worsening shortness of breath over the last week. EXAM: CHEST  2 VIEW COMPARISON:  04/13/2016 FINDINGS: Previous median sternotomy and CABG. Bilateral pulmonary edema pattern. Small bilateral effusions. Pneumonia not excluded but not favored. No significant bone finding. IMPRESSION: Pulmonary edema and developing bilateral effusions. Electronically Signed   By: Paulina Fusi M.D.   On: 03/30/2017 12:04    I have seen and evaluated the patient with Dr. Darin Engels. I am in agreement with the note above in its revised form. My additions are in blue.  Lovena Neighbours, MD Family Medicine, PGY-2  Oralia Manis, DO 03/30/2017, 3:06 PM PGY-1, [facility] FPTS Intern pager: [phone], text pages welcome

## 2017-03-30 NOTE — ED Provider Notes (Signed)
[facility]-EMERGENCY DEPT Provider Note   CSN: [id number] Arrival date & time: 03/30/17  1059     History   Chief Complaint Chief Complaint  Patient presents with  . Shortness of Breath    HPI Rocklan Gaber is a 56 y.o. male.  HPI   56 year old male past medical history significant for congestive heart failure, CABG, hypertension, hyperlipidemia, PAD, abdominal aortic aneurysm, stroke, peripheral vascular disease presenting today with shortness of breath.  The shortness breath has gotten worse over the last week. He reports he does notice any weight gain. He has not noticed any new swelling. Denies any recent travel. Denies any congestion, cough, flu symptoms.  Past Medical History:  Diagnosis Date  . AAA (abdominal aortic aneurysm) [facility]) July 2013  . Abnormal heart rhythm   . Anxiety   . CAD (coronary artery disease)   . Carotid artery occlusion   . CHF (congestive heart failure) (HCC)   . GERD (gastroesophageal reflux disease)   . Heart attack (HCC) 2010  . Hx of CABG 2010  . Hyperlipidemia   . Hypertension   . Irregular heartbeat   . Lung nodule   . Polysubstance abuse (HCC)   . PVD (peripheral vascular disease) (HCC)   . Shortness of breath    when lying flat on back  . Stroke (HCC)   . Uncontrolled hypertension 02/02/2012    Patient Active Problem List   Diagnosis Date Noted  . Dyspnea 03/23/2017  . Cellulitis of right ankle 01/23/2017  . Muscle spasm 10/23/2016  . Insomnia 04/11/2016  . Orthopnea 03/31/2016  . 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 HTN (hypertension) 06/15/2014  . HLD (hyperlipidemia) 04/05/2014  . PVD (peripheral vascular disease) with claudication (HCC) 05/09/2013  . GERD (gastroesophageal reflux disease) 03/28/2013  . AAA (abdominal aortic aneurysm) without rupture (HCC) 10/28/2012  . Anxiety 02/12/2012  . Tobacco abuse 02/02/2012  . Occlusion and stenosis of carotid artery without mention of  cerebral infarction 01/19/2012  . Pulmonary nodule 05/19/2011  . CAD, ARTERY BYPASS GRAFT 10/19/2008  . DISC DISEASE, LUMBAR 10/19/2008    Past Surgical History:  Procedure Laterality Date  . ABDOMINAL AORTAGRAM N/A 04/19/2013   Procedure: ABDOMINAL AORTAGRAM;  Surgeon: Nada Libman, MD;  Location[facility] LAB;  Service: Cardiovascular;  Laterality: N/A;  . CAROTID ENDARTERECTOMY Left 01-13-13   Attempted cea  . CAROTID STENT INSERTION Left 02/22/2013   Procedure: CAROTID STENT INSERTION;  Surgeon: Nada Libman, MD;  Location: [facility] CATH LAB;  Service: Cardiovascular;  Laterality: Left;  . CORONARY ARTERY BYPASS GRAFT  2010  . ENDARTERECTOMY Left 01/13/2013   Procedure: ATTEMPTED ENDARTERECTOMY CAROTID;  Surgeon: Nada Libman, MD;  Location: [facility] OR;  Service: Vascular;  Laterality: Left;       Home Medications    Prior to Admission medications   Medication Sig Start Date End Date Taking? Authorizing Provider  aspirin EC 81 MG tablet Take 1 tablet (81 mg total) by mouth daily. 03/31/16   Howard Pouch, MD  atorvastatin (LIPITOR) 80 MG tablet Take 1 tablet (80 mg total) by mouth daily. 10/23/16   Mikell, Antionette Poles, MD  carvedilol (COREG) 12.5 MG tablet TAKE ONE TABLET BY MOUTH TWICE DAILY WITH A MEAL 12/11/16   Howard Pouch, MD  clopidogrel (PLAVIX) 75 MG tablet Take 1 tablet (75 mg total) by mouth daily with breakfast. 01/23/17   Wendee Beavers, DO  ezetimibe (ZETIA) 10 MG tablet Take 1 tablet (10 mg  total) by mouth daily. 05/28/16   Howard Pouch, MD  lisinopril (PRINIVIL,ZESTRIL) 40 MG tablet Take 1 tablet (40 mg total) by mouth daily. 03/31/16   Howard Pouch, MD  ranitidine (ZANTAC) 300 MG tablet Take 1 tablet (300 mg total) by mouth daily. 03/31/16   Howard Pouch, MD  tiZANidine (ZANAFLEX) 2 MG tablet Take 1 tablet (2 mg total) by mouth every 6 (six) hours as needed for muscle spasms. 10/23/16   Mikell, Antionette Poles, MD  traMADol (ULTRAM) 50 MG tablet Take 1 tablet (50 mg total)  by mouth every 8 (eight) hours as needed. 10/23/16   Berton Bon, MD    Family History Family History  Problem Relation Age of Onset  . Asthma Mother   . Diabetes Mother   . Hyperlipidemia Mother   . Hypertension Mother   . Lung cancer Father   . Cancer Father   . Stroke Sister     Social History Social History  Substance Use Topics  . Smoking status: Former Smoker    Packs/day: 0.25    Years: 30.00    Types: Cigarettes    Quit date: 01/13/2013  . Smokeless tobacco: Never Used  . Alcohol use Yes     Comment: occasionally     Allergies   Patient has no known allergies.   Review of Systems Review of Systems  Constitutional: Positive for fatigue. Negative for activity change and fever.  Respiratory: Positive for shortness of breath.   Cardiovascular: Positive for chest pain.  Gastrointestinal: Negative for abdominal pain.  All other systems reviewed and are negative.    Physical Exam Updated Vital Signs BP (!) 163/106   Pulse (!) 101   Temp 98.2 F (36.8 C) (Oral)   Resp (!) 28   SpO2 92%   Physical Exam  Constitutional: He is oriented to person, place, and time. He appears well-nourished.  HENT:  Head: Normocephalic.  Eyes: Conjunctivae are normal. Right eye exhibits no discharge. Left eye exhibits no discharge.  Cardiovascular:  Tachycardia.  Pulmonary/Chest: He is in respiratory distress. He has no wheezes. He has no rales.  Tachypnea. Clear lungs, shallow respirations.  Abdominal: Soft. There is no tenderness. There is no guarding.  Musculoskeletal:  Right leg appears larger than left leg today. No edema.  Neurological: He is oriented to person, place, and time.  Skin: Skin is warm and dry. He is not diaphoretic.  Psychiatric: He has a normal mood and affect. His behavior is normal.     ED Treatments / Results  Labs (all labs ordered are listed, but only abnormal results are displayed) Labs Reviewed  BASIC METABOLIC PANEL - Abnormal;  Notable for the following:       Result Value   Glucose, Bld 106 (*)    Calcium 8.6 (*)    All other components within normal limits  CBC  D-DIMER, QUANTITATIVE (NOT AT [facility])  PROTIME-INR  I-STAT TROPONIN, ED  I-STAT CG4 LACTIC ACID, ED  I-STAT VENOUS BLOOD GAS, ED    EKG  EKG Interpretation None       Radiology No results found.  Procedures Procedures (including critical care time)  Medications Ordered in ED Medications  ipratropium-albuterol (DUONEB) 0.5-2.5 (3) MG/3ML nebulizer solution 3 mL (not administered)     Initial Impression / Assessment and Plan / ED Course  I have reviewed the triage vital signs and the nursing notes.  Pertinent labs & imaging results that were available during my care of the patient were reviewed  by me and considered in my medical decision making (see chart for details).    56 year old male past medical history significant for congestive heart failure, CABG, hypertension, hyperlipidemia, PAD, abdominal aortic aneurysm, stroke, peripheral vascular disease presenting today with shortness of breath.  The shortness breath has gotten worse over the last week. He reports he does notice any weight gain. He has not noticed any new swelling. Denies any recent travel. Denies any congestion, cough, flu symptoms.  12:01 PM On physical exam patient has no Rales, wheezing. However definitive tachypnea. Therefore concern for secondary causes of shortness of breath besides his COPD or heart failure. D-dimer sent. VBG sent.  Patient does have right leg which appears larger than left, patient had never noticed it.   1:03 PM Age adjusted a d-dimer negative. Patient has evidence of fluid overload on x-ray. Patient's last echo was 55 EF last October. We'll need to admit for diuresis, repeat echo.   Final Clinical Impressions(s) / ED Diagnoses   Final diagnoses:  None    New Prescriptions New Prescriptions   No medications on file     Abelino Derrick, MD 03/30/17 1303

## 2017-03-30 NOTE — ED Notes (Signed)
Patient transported to X-ray 

## 2017-03-30 NOTE — Progress Notes (Signed)
Patient arrived to [phone]. Ambulated to bed and identified properly. Oriented to room. Assessed pt; pt is A&Ox4, slightly tachypneic, not in distress and resting at this time. Skin is intact. Denies any pain or nausea. Will continue to monitor.

## 2017-03-30 NOTE — ED Notes (Signed)
Pt placed on 2 L , O2 sats 88-90%.

## 2017-03-30 NOTE — ED Notes (Signed)
Admitting providers at bedside

## 2017-03-30 NOTE — ED Triage Notes (Signed)
PT states he is sob bad and cannot breath. Pt has had OHS, strokes, MI, CHF.  Pt states when he lays back he cannot breath.  Pt states he has been sitting up all weekend

## 2017-03-30 NOTE — ED Notes (Signed)
Dr. Corlis Leak made aware of increasing BP. He did not take BP meds this morning

## 2017-03-30 NOTE — ED Notes (Signed)
Attempted report x1. 

## 2017-03-30 NOTE — Progress Notes (Signed)
Family Medicine Teaching Service Daily Progress Note Intern Pager: [phone]  Patient name: Randy [name] Medical record number: [id number] Date of birth: February 21, 1961 Age: 56 y.o. Gender: male  Primary Care Provider: Howard Pouch, MD Consultants: none Code Status: FULL  Pt Overview and Major Events to Date:  Patient is a 56 y.o. Male who presented for evaluation of SOB and cough. For 1 week the patient gradually developed shortness of breath with an associated cough. His symptoms exacerbated where he developed orthopnea, PND and he could not walk more than 50 feet without developing shortness of breath. BNP upon admission was elevated at 1587.1, lactic acid 1.24 with negative tropoin x1. A CXR on 10/1 showed pulmonary edema with developing bilateral effusions suggestive of acute CHF exacerbation, volume overload. He was placed on Lasix IV to diurese him. An ECG done showed LVH and possible left atrial enlargement. Echo was obtained for further evaluation, but these are pending. Overnight he diuresed close to 3L of fluid. He was placed on 7L of O2, down to 5L on 10/2. Patient has been gradually improving.  Assessment and Plan: Patient is a 56 y.o. male with a pMHx of MI, CAD, stroke, HLD, HTN, tobacco and ETOH abuse, AAA w/o rupture, GERD and PVD who presented to the ED for evaluation of worsening dyspnea.    Dyspnea: Improved. likely due to new CHF. Risk factors include significant vascular disease, hypertension, dietary indiscretion and over-the-counter decongestants. Responded well to IV Lasix with a good urine output and improvement in his symptoms. Exam with fine bibasilar crackles. -Wean Oxygen as able. Currently on 2 L -Transitioned to oral Lasix 40 mg twice a day -Continue cardiac monitoring  -Follow up echocardiogram -Strict I and O, and daily weight -encouraged low salt diet and OOB as tolerated  -Daily BMP  CAD s/p CABG 10/11/2008 and open endarterectomy, left and right  carotid artery  stenting. Patient also has a history of AAA measuring 4.3 (02/2016) unchanged from 2014. EKG with some T wave changes. -Check troponin -continue aspirin, carvedilol, plavix.  Hyperlipidemia -Patient states out of atorvastatin at home  -Continue on lisinopril 40 mg daily  HTN 180/112 (10/1). Patient reports not taking BP medications yesterday. Today 115/75  -continue Lisinopril 40 mg daily  GERD Home ranitidine. -continue home medications  Alcohol Abuse Patient drinks 4-5 beers a day typically, but has decreased his intake in the past 2 months. Patient states last drink was on 9/29 with 1 beer. No h/o DT or severe withdrawal, will be safe and watch patient.  -CIWA protocol   Tobacco Abuse  Patient reports quitting 2 months ago.  -no need for nicotine patch at this time. Can re-consider per patient preference.   FEN/GI: heart healthy diet, encouraged low salt  Prophylaxis: Lovenox  Disposition: Continued Management   Subjective:  Randy [name] is a 56 year old male who presented 10/1 for evaluation of SOB for 1 week. Today, the patient reports he has improved dramatically. The patient reports his breathing is at baseline for him, though he has Stephens 5L in place. He states he was able to sleep through the night, slightly propped up in bed. Patient notes ability to ambulate to the bathroom without SOB. He states he has not had much fluid PO  this morning other than coffee. He has been able to void without issue. Patient denies continued cough, LE pain, N/V/D, headache, blurry vision, chest pain or abdominal pain.   Objective: Temp:  [98 F (36.7 C)-98.5 F (36.9 C)] 98.3  F (36.8 C) (10/02 0557) Pulse Rate:  [72-109] 73 (10/02 0911) Resp:  [10-31] 20 (10/02 0557) BP: (96-188)/(17-130) 115/67 (10/02 0912) SpO2:  [91 %-96 %] 95 % (10/02 0557) Weight:  [180 lb 6.4 oz (81.8 kg)-184 lb 4.8 oz (83.6 kg)] 180 lb 6.4 oz (81.8 kg) (10/02 0557) Physical Exam: General: Alert and awake.  Answers questions appropriately. Sitting upright in bed, relaxed. Cardiovascular: Regular rate and rhythm. No JVD. No edema Respiratory: Breath sounds quiet, bilateral fine crackles at bases. Abdomen: Soft, non-tender. BS are present.   Extremities: DP pulses intact. No LE edema or erythema.  Neuro: no focal neuro deficit   Laboratory:  Recent Labs Lab 03/30/17 1105 03/31/17 0523  WBC 7.4 8.9  HGB 13.4 13.5  HCT 42.6 41.7  PLT 252 265    Recent Labs Lab 03/30/17 1105 03/31/17 0523  NA 136 134*  K 3.9 3.5  CL 101 99*  CO2 26 28  BUN 13 16  CREATININE 1.15 1.52*  CALCIUM 8.6* 8.5*  GLUCOSE 106* 127*     Imaging/Diagnostic Tests: Dg Chest Port 1 View  Result Date: 03/31/2017 CLINICAL DATA:  Shortness of breath.  Hypertension. EXAM: PORTABLE CHEST 1 VIEW COMPARISON:  March 30, 2017 FINDINGS: There are bilateral pleural effusions. There is pulmonary edema in the lower lung zones. There is less perihilar edema compared to 1 day prior. There is airspace consolidation in the left lower lobe and medial right base regions. There is cardiomegaly. There is mild pulmonary venous hypertension. Patient is status post coronary artery bypass grafting. No adenopathy. No bone lesions. IMPRESSION: Congestive heart failure with interstitial edema and effusions. There is less perihilar alveolar edema compared to 1 day prior. There is airspace consolidation in the left lower lobe and medial right base. Suspect superimposed atelectasis and potential pneumonia in these areas. Stable cardiac silhouette. Patient is status post coronary artery bypass grafting. Electronically Signed   By: Randy [name] M.D.   On: 03/31/2017 07:35     Randy [name], Medical Student 03/31/2017, 1:13 PM [facility] FPTS Intern pager: [phone], text pages welcome  I have seen and evaluated the patient with Student Randy [name]. I have reviewed and edited the note.  Randy Stagers, MD,  PGY-2 03/31/2017 3:01 PM

## 2017-03-30 NOTE — ED Notes (Signed)
ED Provider at bedside. 

## 2017-03-31 ENCOUNTER — Inpatient Hospital Stay (HOSPITAL_COMMUNITY): Payer: Medicaid Other

## 2017-03-31 DIAGNOSIS — I739 Peripheral vascular disease, unspecified: Secondary | ICD-10-CM

## 2017-03-31 DIAGNOSIS — I5041 Acute combined systolic (congestive) and diastolic (congestive) heart failure: Secondary | ICD-10-CM

## 2017-03-31 DIAGNOSIS — I6523 Occlusion and stenosis of bilateral carotid arteries: Secondary | ICD-10-CM

## 2017-03-31 DIAGNOSIS — R0600 Dyspnea, unspecified: Secondary | ICD-10-CM

## 2017-03-31 DIAGNOSIS — Z9114 Patient's other noncompliance with medication regimen: Secondary | ICD-10-CM

## 2017-03-31 DIAGNOSIS — R0601 Orthopnea: Secondary | ICD-10-CM

## 2017-03-31 DIAGNOSIS — I25810 Atherosclerosis of coronary artery bypass graft(s) without angina pectoris: Secondary | ICD-10-CM

## 2017-03-31 LAB — CBC
HCT: 41.7 % (ref 39.0–52.0)
HCT: 38.8 % — ABNORMAL LOW (ref 39.0–52.0)
HEMOGLOBIN: 13.5 g/dL (ref 13.0–17.0)
HEMOGLOBIN: 12.5 g/dL — AB (ref 13.0–17.0)
MCH: 28.2 pg (ref 26.0–34.0)
MCH: 28.1 pg (ref 26.0–34.0)
MCHC: 32.4 g/dL (ref 30.0–36.0)
MCHC: 32.2 g/dL (ref 30.0–36.0)
MCV: 87.2 fL (ref 78.0–100.0)
MCV: 87.2 fL (ref 78.0–100.0)
PLATELETS: 238 10*3/uL (ref 150–400)
Platelets: 265 10*3/uL (ref 150–400)
RBC: 4.78 MIL/uL (ref 4.22–5.81)
RBC: 4.45 MIL/uL (ref 4.22–5.81)
RDW: 14.5 % (ref 11.5–15.5)
RDW: 14.2 % (ref 11.5–15.5)
WBC: 8.9 10*3/uL (ref 4.0–10.5)
WBC: 8.1 10*3/uL (ref 4.0–10.5)

## 2017-03-31 LAB — BASIC METABOLIC PANEL
Anion gap: 7 (ref 5–15)
BUN: 16 mg/dL (ref 6–20)
CALCIUM: 8.5 mg/dL — AB (ref 8.9–10.3)
CO2: 28 mmol/L (ref 22–32)
CREATININE: 1.52 mg/dL — AB (ref 0.61–1.24)
Chloride: 99 mmol/L — ABNORMAL LOW (ref 101–111)
GFR, EST AFRICAN AMERICAN: 57 mL/min — AB (ref >60)
GFR, EST NON AFRICAN AMERICAN: 49 mL/min — AB (ref >60)
Glucose, Bld: 127 mg/dL — ABNORMAL HIGH (ref 65–99)
Potassium: 3.5 mmol/L (ref 3.5–5.1)
SODIUM: 134 mmol/L — AB (ref 135–145)

## 2017-03-31 LAB — HIV ANTIBODY (ROUTINE TESTING W REFLEX): HIV Screen 4th Generation wRfx: NONREACTIVE

## 2017-03-31 LAB — ECHOCARDIOGRAM COMPLETE
HEIGHTINCHES: 71 in
WEIGHTICAEL: 2886.4 [oz_av]

## 2017-03-31 LAB — TROPONIN I
TROPONIN I: 0.07 ng/mL — AB (ref <0.03)
Troponin I: 0.07 ng/mL (ref <0.03)

## 2017-03-31 LAB — CREATININE, SERUM
CREATININE: 1.71 mg/dL — AB (ref 0.61–1.24)
GFR calc Af Amer: 49 mL/min — ABNORMAL LOW (ref >60)
GFR, EST NON AFRICAN AMERICAN: 42 mL/min — AB (ref >60)

## 2017-03-31 LAB — MAGNESIUM: MAGNESIUM: 1.9 mg/dL (ref 1.7–2.4)

## 2017-03-31 MED ORDER — FUROSEMIDE 40 MG PO TABS
40.0000 mg | ORAL_TABLET | Freq: Two times a day (BID) | ORAL | Status: DC
  Administered 2017-03-31 – 2017-04-01 (×2): 40 mg via ORAL
  Filled 2017-03-31 (×2): qty 1

## 2017-03-31 MED ORDER — ENOXAPARIN SODIUM 40 MG/0.4ML ~~LOC~~ SOLN
40.0000 mg | Freq: Every day | SUBCUTANEOUS | Status: DC
  Administered 2017-03-31: 40 mg via SUBCUTANEOUS
  Filled 2017-03-31 (×2): qty 0.4

## 2017-03-31 MED ORDER — POTASSIUM CHLORIDE CRYS ER 20 MEQ PO TBCR
40.0000 meq | EXTENDED_RELEASE_TABLET | Freq: Once | ORAL | Status: AC
  Administered 2017-03-31: 40 meq via ORAL
  Filled 2017-03-31: qty 2

## 2017-03-31 MED ORDER — PERFLUTREN LIPID MICROSPHERE
1.0000 mL | INTRAVENOUS | Status: AC | PRN
  Administered 2017-03-31: 2 mL via INTRAVENOUS
  Filled 2017-03-31: qty 10

## 2017-03-31 NOTE — Consult Note (Signed)
Cardiology Consultation:   Patient ID: Randy [name]; [id number]; Apr 28, 1961   Admit date: 03/30/2017 Date of Consult: 03/31/2017  Primary Care Provider: Howard Pouch, MD Primary Cardiologist: Dr. Delton See Primary Electrophysiologist:     Patient Profile:   Randy [name] is a 56 y.o. male with a hx of CAD s/p CABG (2010), AAA, carotid artery disease s/p R CEA and L carotid stenting, severe PVD with claudication, HTN, polysubstance abuse, CVA (2015), and HLD who is being seen today for the evaluation of new onset systolic and diastolic heart failure at the request of Dr. Jennette Kettle.  History of Present Illness:   [name] is known to this service and last saw [name] in clinic on 04/24/16. At that time, he was in his usual state of health, which included c/o atypical chest pain not consistent with anginal pain.  He presented to [facility] with worsening SOB over the past 2 weeks. He stated he was unable to rest in the supine position since last weekend. On arrival, CXR with evidence of CHF and elevated BNP. He was admitted for IV diuresis.   On my interview, he states he has been out of his HTN medication for approximately 2 weeks. He tried to obtain refills, but was told he needed an appt. He attempted to report to the associated walk-in clinic. He was triaged by the nurse, but left without being seen on 03/23/17. Yesterday 03/30/17, he reported to [facility] for SOB. He denies chest pain, recent illness, palpitations, and syncope He states his pressure has been 140-160s while out of his medications, but this was only for approximately 2 weeks.    Past Medical History:  Diagnosis Date  . AAA (abdominal aortic aneurysm) [facility]) July 2013  . Abnormal heart rhythm   . Anxiety   . CAD (coronary artery disease)   . Carotid artery occlusion   . CHF (congestive heart failure) (HCC)   . GERD (gastroesophageal reflux disease)   . Heart attack (HCC) 2010  . Hx of CABG 2010  . Hyperlipidemia   .  Hypertension   . Irregular heartbeat   . Lung nodule   . Polysubstance abuse (HCC)   . PVD (peripheral vascular disease) (HCC)   . Shortness of breath    when lying flat on back  . Stroke (HCC)   . Uncontrolled hypertension 02/02/2012    Past Surgical History:  Procedure Laterality Date  . ABDOMINAL AORTAGRAM N/A 04/19/2013   Procedure: ABDOMINAL AORTAGRAM;  Surgeon: Nada Libman, MD;  Location: [facility] CATH LAB;  Service: Cardiovascular;  Laterality: N/A;  . CAROTID ENDARTERECTOMY Left 01-13-13   Attempted cea  . CAROTID STENT INSERTION Left 02/22/2013   Procedure: CAROTID STENT INSERTION;  Surgeon: Nada Libman, MD;  Location: [facility] CATH LAB;  Service: Cardiovascular;  Laterality: Left;  . CORONARY ARTERY BYPASS GRAFT  2010  . ENDARTERECTOMY Left 01/13/2013   Procedure: ATTEMPTED ENDARTERECTOMY CAROTID;  Surgeon: Nada Libman, MD;  Location: [facility] OR;  Service: Vascular;  Laterality: Left;     Home Medications:  Prior to Admission medications   Medication Sig Start Date End Date Taking? Authorizing Provider  aspirin EC 81 MG tablet Take 1 tablet (81 mg total) by mouth daily. 03/31/16  Yes Howard Pouch, MD  atorvastatin (LIPITOR) 80 MG tablet Take 1 tablet (80 mg total) by mouth daily. 10/23/16  Yes Mikell, Antionette Poles, MD  carvedilol (COREG) 12.5 MG tablet TAKE ONE TABLET BY MOUTH TWICE DAILY WITH A MEAL 12/11/16  Yes  Howard Pouch, MD  clopidogrel (PLAVIX) 75 MG tablet Take 1 tablet (75 mg total) by mouth daily with breakfast. 01/23/17  Yes Wendee Beavers, DO  diphenhydramine-acetaminophen (TYLENOL PM) 25-500 MG TABS tablet Take 2 tablets by mouth at bedtime as needed.   Yes [provider]  ezetimibe (ZETIA) 10 MG tablet Take 1 tablet (10 mg total) by mouth daily. 05/28/16  Yes Howard Pouch, MD  lisinopril (PRINIVIL,ZESTRIL) 40 MG tablet Take 1 tablet (40 mg total) by mouth daily. 03/31/16  Yes Howard Pouch, MD  omeprazole (PRILOSEC) 20 MG capsule Take 20 mg by mouth daily.    Yes [provider]  Phenylephrine-DM-GG (MUCINEX CONGEST & COUGH CHILD) 2.5-5-100 MG/5ML LIQD Take 10 mLs by mouth every 4 (four) hours as needed (for cough).   Yes [provider]  ranitidine (ZANTAC) 300 MG tablet Take 1 tablet (300 mg total) by mouth daily. 03/31/16  Yes Howard Pouch, MD    Inpatient Medications: Scheduled Meds: . aspirin EC  81 mg Oral Daily  . atorvastatin  80 mg Oral Daily  . carvedilol  12.5 mg Oral BID WC  . clopidogrel  75 mg Oral Q breakfast  . enoxaparin (LOVENOX) injection  40 mg Subcutaneous Q24H  . ezetimibe  10 mg Oral Daily  . famotidine  40 mg Oral QHS  . furosemide  40 mg Oral BID  . lisinopril  40 mg Oral Daily  . mouth rinse  15 mL Mouth Rinse BID  . sodium chloride flush  3 mL Intravenous Q12H   Continuous Infusions: . sodium chloride     PRN Meds: sodium chloride, acetaminophen, LORazepam **OR** LORazepam, ondansetron (ZOFRAN) IV, sodium chloride flush  Allergies:   No Known Allergies  Social History:   Social History   Social History  . Marital status: Widowed    Spouse name: N/A  . Number of children: 1  . Years of education: 9th   Occupational History  . not working.     Social History Main Topics  . Smoking status: Former Smoker    Packs/day: 0.50    Years: 30.00    Types: Cigarettes    Quit date: 01/28/2017  . Smokeless tobacco: Never Used  . Alcohol use 3.0 oz/week    5 Cans of beer per week     Comment: occasionally  . Drug use: Yes    Types: Marijuana  . Sexual activity: Yes   Other Topics Concern  . Not on file   Social History Narrative   Pt is widowed and lives alone.   Patient is right handed   Patient drinks about 2cups of caffeine daily.          Family History:    Family History  Problem Relation Age of Onset  . Asthma Mother   . Diabetes Mother   . Hyperlipidemia Mother   . Hypertension Mother   . Lung cancer Father   . Cancer Father   . Stroke Sister      ROS:  Please  see the history of present illness.  ROS  All other ROS reviewed and negative.     Physical Exam/Data:   Vitals:   03/31/17 0557 03/31/17 0911 03/31/17 0912 03/31/17 1407  BP: 115/75 (!) 115/57 115/67 (!) 101/58  Pulse: 73 73  61  Resp: 20   18  Temp: 98.3 F (36.8 C)   98.1 F (36.7 C)  TempSrc: Oral   Oral  SpO2: 95%   94%  Weight: 180  lb 6.4 oz (81.8 kg)     Height:        Intake/Output Summary (Last 24 hours) at 03/31/17 1513 Last data filed at 03/31/17 1405  Gross per 24 hour  Intake              300 ml  Output             2500 ml  Net            -2200 ml   Filed Weights   03/30/17 2115 03/31/17 0557  Weight: 184 lb 4.8 oz (83.6 kg) 180 lb 6.4 oz (81.8 kg)   Body mass index is 25.16 kg/m.  General:  Well nourished, well developed, in no acute distress HEENT: normal Lymph: no adenopathy Neck: no JVD Endocrine:  No thryomegaly Vascular: right carotid bruit Cardiac:  normal S1, S2; RRR; no murmur  Lungs:  clear to auscultation bilaterally, no wheezing, occasional crackle Abd: soft, nontender, no hepatomegaly  Ext: trace edema Musculoskeletal:  No deformities, BUE and BLE strength normal and equal Skin: warm and dry  Neuro:  CNs 2-12 intact, no focal abnormalities noted Psych:  Normal affect   EKG:  The EKG was personally reviewed and demonstrates:  Sinus, inverted T waves in precordial leads which are new since 2017 Telemetry:  Telemetry was personally reviewed and demonstrates:  N/A  Relevant CV Studies:  Echocardiogram 03/31/17: Study Conclusions - Left ventricle: The cavity size was normal. Wall thickness was   normal. Systolic function was moderately to severely reduced. The   estimated ejection fraction was in the range of 30% to 35%. Wall   motion was normal; there were no regional wall motion   abnormalities. Features are consistent with a pseudonormal left   ventricular filling pattern, with concomitant abnormal relaxation   and increased filling  pressure (grade 2 diastolic dysfunction). - Left atrium: The atrium was mildly dilated. - Right atrium: The atrium was mildly dilated.   Echocardiogram 04/14/16: Study Conclusions - Left ventricle: The cavity size was normal. Wall thickness was   normal. The estimated ejection fraction was 55%. Left ventricular   diastolic function parameters were normal. - Left atrium: The atrium was mildly dilated. - Atrial septum: No defect or patent foramen ovale was identified.   Myoview 10/23/13: 1. Negative for pharmacologic-stress induced ischemia. 2. Left ventricular ejection fraction 48%.   Laboratory Data:  Chemistry Recent Labs Lab 03/30/17 1105 03/31/17 0523  NA 136 134*  K 3.9 3.5  CL 101 99*  CO2 26 28  GLUCOSE 106* 127*  BUN 13 16  CREATININE 1.15 1.52*  CALCIUM 8.6* 8.5*  GFRNONAA >60 49*  GFRAA >60 57*  ANIONGAP 9 7    No results for input(s): PROT, ALBUMIN, AST, ALT, ALKPHOS, BILITOT in the last 168 hours. Hematology Recent Labs Lab 03/30/17 1105 03/31/17 0523  WBC 7.4 8.9  RBC 4.89 4.78  HGB 13.4 13.5  HCT 42.6 41.7  MCV 87.1 87.2  MCH 27.4 28.2  MCHC 31.5 32.4  RDW 14.5 14.5  PLT 252 265   Cardiac EnzymesNo results for input(s): TROPONINI in the last 168 hours.  Recent Labs Lab 03/30/17 1125  TROPIPOC 0.03    BNP Recent Labs Lab 03/30/17 1105  BNP 1,587.1*    DDimer  Recent Labs Lab 03/30/17 1142  DDIMER 0.55*    Radiology/Studies:  Dg Chest 2 View  Result Date: 03/30/2017 CLINICAL DATA:  Worsening shortness of breath over the last week. EXAM: CHEST  2  VIEW COMPARISON:  04/13/2016 FINDINGS: Previous median sternotomy and CABG. Bilateral pulmonary edema pattern. Small bilateral effusions. Pneumonia not excluded but not favored. No significant bone finding. IMPRESSION: Pulmonary edema and developing bilateral effusions. Electronically Signed   By: Paulina Fusi M.D.   On: 03/30/2017 12:04   Dg Chest Port 1 View  Result Date:  03/31/2017 CLINICAL DATA:  Shortness of breath.  Hypertension. EXAM: PORTABLE CHEST 1 VIEW COMPARISON:  March 30, 2017 FINDINGS: There are bilateral pleural effusions. There is pulmonary edema in the lower lung zones. There is less perihilar edema compared to 1 day prior. There is airspace consolidation in the left lower lobe and medial right base regions. There is cardiomegaly. There is mild pulmonary venous hypertension. Patient is status post coronary artery bypass grafting. No adenopathy. No bone lesions. IMPRESSION: Congestive heart failure with interstitial edema and effusions. There is less perihilar alveolar edema compared to 1 day prior. There is airspace consolidation in the left lower lobe and medial right base. Suspect superimposed atelectasis and potential pneumonia in these areas. Stable cardiac silhouette. Patient is status post coronary artery bypass grafting. Electronically Signed   By: Bretta Bang III M.D.   On: 03/31/2017 07:35    Assessment and Plan:   1. New onset systolic and diastolic heart failure - echo with newly reduced LVEF of 30-35% and grade 2 DD - BNP on admission 1587.1 with normal creatinine - CXR with edema and bilateral pleural effusions; CXR today with some improvement in bilateral effusions - diuresing on 40 mg IV lasix BID - overall net negative 2.6 L with nearly 3L urine output yesterday Etiology unknown. Per the patient, his pressure has been well-controlled until about 2 weeks ago. His pressures have been 140-160s for the past 2 weeks. He denies chest pain and echo without new regional wall motion abnormality. Diffuse inverted T waves may be related to ischemia or stress-induced cardiomyopathy.  - restart home coreg and lisinopril - both very good options for heart failure - ok to transition to PO lasix 40 mg BID tomorrow - plan outpatient cath   2. CAD s/p CABG x 4 in 2010 (LIMA to LAD, SVG to diagonal, OM, PDA) - pt denies recent anginal chest  pain - troponin negative With known CAD, carotid artery stenosis, and claudication, we will plan heart catheterization tomorrow. - continue ASA and plavix   3. HTN - continue as above - pressure has been labile: 100-180s - better-controlled with lasix   4. HLD - he has been out of lipitor at home - uncontrolled - needs to increase lipitor to 80 mg  - restart zetia - consider repatha if 80 mg lipitor does not improve lipid profile   5. AKI  - sCr 1.52 (1.15) - transition lasix to PO tomorrow - gentle K replacement    For questions or updates, please contact [facility] Please consult [email or web address] for contact info under Cardiology/STEMI.   Signed, Marcelino Duster, PA  03/31/2017 3:13 PM  I have seen and examined the patient along with Marcelino Duster, PA.  I have reviewed the chart, notes and new data.  I agree with PA's note.  Key new complaints: staunchly denies angina, breathing is much better. Overindulges in crackers, Gatorade, sausages, deli meats and other high sodium food. Thinks Himalayan salt is better than table salt. Key examination changes: no JVD, rales, S3, edema Key new findings / data: marked anterior T wave inversion on ECG, mild and plateau elevation in troponin, marked  reduction in LVEF (from 55% to 30-35%). On my review, there are clear wall motion regional abnormalities: the whole lateral, inferolateral and inferior wall territories look worse than the septum, apex and anterior wall.   PLAN: Suspect that he has lost some of his bypass grafts, but LIMA is still open. There is no evidence, though, of a recent acute coronary event. Exacerbation was triggered by dietary salt excess and noncompliance with BP meds. He is already on carvedilol and RAAS inhibitor. Mild acute renal insufficiency following diuresis. Lengthy discussion regarding limiting sodium intake and taking meds. Also needs coronary angiography. He does not want to have that done  now, even though I told him that is my very strong preference. On the other hand, he may benefit from a few days to allow renal function to stabilize. Switched to PO diuretics, possible DC tomorrow if off O2 and renal function better. Can bring back for elective angiography and possible PCI-stent early next week. This procedure has been fully reviewed with the patient and written informed consent has been obtained.   Thurmon Fair, MD, [facility] [phone] 03/31/2017, 5:27 PM

## 2017-03-31 NOTE — Progress Notes (Signed)
Pt complains of blurry vision. MD walked in 2 minutes later so I notified in person. Not concerned at this time but please notify if gets worse

## 2017-03-31 NOTE — Progress Notes (Addendum)
Family medicine paged. For troponin 0.07. MD returned call. Will order q6 troponin

## 2017-03-31 NOTE — Progress Notes (Signed)
  Echocardiogram 2D Echocardiogram with Definity has been performed.  Nolon Rod 03/31/2017, 1:48 PM

## 2017-04-01 LAB — BASIC METABOLIC PANEL
Anion gap: 11 (ref 5–15)
BUN: 16 mg/dL (ref 6–20)
CHLORIDE: 97 mmol/L — AB (ref 101–111)
CO2: 27 mmol/L (ref 22–32)
CREATININE: 1.36 mg/dL — AB (ref 0.61–1.24)
Calcium: 8.3 mg/dL — ABNORMAL LOW (ref 8.9–10.3)
GFR, EST NON AFRICAN AMERICAN: 56 mL/min — AB (ref >60)
Glucose, Bld: 103 mg/dL — ABNORMAL HIGH (ref 65–99)
Potassium: 3.4 mmol/L — ABNORMAL LOW (ref 3.5–5.1)
SODIUM: 135 mmol/L (ref 135–145)

## 2017-04-01 LAB — MAGNESIUM: MAGNESIUM: 1.8 mg/dL (ref 1.7–2.4)

## 2017-04-01 LAB — TROPONIN I: TROPONIN I: 0.06 ng/mL — AB (ref <0.03)

## 2017-04-01 MED ORDER — POTASSIUM CHLORIDE CRYS ER 20 MEQ PO TBCR
40.0000 meq | EXTENDED_RELEASE_TABLET | Freq: Once | ORAL | Status: AC
  Administered 2017-04-01: 40 meq via ORAL
  Filled 2017-04-01: qty 2

## 2017-04-01 MED ORDER — FUROSEMIDE 40 MG PO TABS: 40.0000 mg | ORAL_TABLET | Freq: Two times a day (BID) | ORAL | 0 refills | Status: DC

## 2017-04-01 NOTE — Progress Notes (Signed)
Patient ambulated in the hallways his O2 stat 94-98% on room air.

## 2017-04-01 NOTE — Progress Notes (Signed)
Patient discharge instruction given and prescriptions at [facility]. Patient instructed to  Weight his self  Daily and patient understood instruction and will pick up a scale when he goes to [employer]. Patient also was out of prescriptions and was instructed by discharging MD to go home and find out which medication he was missing and then request pharmacy to send refill request and they will fill. Patient has all personal belongings. IV removed  And was intact.

## 2017-04-01 NOTE — Progress Notes (Signed)
Progress Note  Patient Name: Randy [name] Date of Encounter: 04/01/2017  Primary Cardiologist: Delton See  Subjective   Denies angina or dyspnea. Declines to stay in the hospital for cath today. Agrees to come back for elective cath and possible PCI on Tuesday. -4.5L since amision. Creatinine much better today.  Inpatient Medications    Scheduled Meds: . aspirin EC  81 mg Oral Daily  . atorvastatin  80 mg Oral Daily  . carvedilol  12.5 mg Oral BID WC  . clopidogrel  75 mg Oral Q breakfast  . enoxaparin (LOVENOX) injection  40 mg Subcutaneous QHS  . ezetimibe  10 mg Oral Daily  . famotidine  40 mg Oral QHS  . furosemide  40 mg Oral BID  . mouth rinse  15 mL Mouth Rinse BID  . sodium chloride flush  3 mL Intravenous Q12H   Continuous Infusions: . sodium chloride     PRN Meds: sodium chloride, acetaminophen, LORazepam **OR** LORazepam, ondansetron (ZOFRAN) IV, sodium chloride flush   Vital Signs    Vitals:   03/31/17 1816 03/31/17 2147 04/01/17 0500 04/01/17 0600  BP: 131/89 99/63  126/79  Pulse: 75 63  73  Resp:    20  Temp:  97.7 F (36.5 C)  98.2 F (36.8 C)  TempSrc:  Oral  Oral  SpO2:  96%  95%  Weight:   182 lb 4.8 oz (82.7 kg)   Height:        Intake/Output Summary (Last 24 hours) at 04/01/17 1053 Last data filed at 04/01/17 0452  Gross per 24 hour  Intake                0 ml  Output             1925 ml  Net            -1925 ml   Filed Weights   03/30/17 2115 03/31/17 0557 04/01/17 0500  Weight: 184 lb 4.8 oz (83.6 kg) 180 lb 6.4 oz (81.8 kg) 182 lb 4.8 oz (82.7 kg)    Telemetry    NSR - Personally Reviewed  ECG    No new tracing - Personally Reviewed  Physical Exam  Relaxed, comfortable GEN: No acute distress.   Neck: No JVD Cardiac: RRR, no murmurs, rubs, or gallops.  Respiratory: Clear to auscultation bilaterally. GI: Soft, nontender, non-distended  MS: No edema; No deformity. Neuro:  Nonfocal  Psych: Normal affect   Labs      Chemistry Recent Labs Lab 03/30/17 1105 03/31/17 0523 03/31/17 1518 04/01/17 0415  NA 136 134*  --  135  K 3.9 3.5  --  3.4*  CL 101 99*  --  97*  CO2 26 28  --  27  GLUCOSE 106* 127*  --  103*  BUN 13 16  --  16  CREATININE 1.15 1.52* 1.71* 1.36*  CALCIUM 8.6* 8.5*  --  8.3*  GFRNONAA >60 49* 42* 56*  GFRAA >60 57* 49* >60  ANIONGAP 9 7  --  11     Hematology Recent Labs Lab 03/30/17 1105 03/31/17 0523 03/31/17 1518  WBC 7.4 8.9 8.1  RBC 4.89 4.78 4.45  HGB 13.4 13.5 12.5*  HCT 42.6 41.7 38.8*  MCV 87.1 87.2 87.2  MCH 27.4 28.2 28.1  MCHC 31.5 32.4 32.2  RDW 14.5 14.5 14.2  PLT 252 265 238    Cardiac Enzymes Recent Labs Lab 03/31/17 1518 03/31/17 2158 04/01/17 0415  TROPONINI 0.07* 0.07*  0.06*    Recent Labs Lab 03/30/17 1125  TROPIPOC 0.03     BNP Recent Labs Lab 03/30/17 1105  BNP 1,587.1*     DDimer  Recent Labs Lab 03/30/17 1142  DDIMER 0.55*     Radiology    Dg Chest 2 View  Result Date: 03/30/2017 CLINICAL DATA:  Worsening shortness of breath over the last week. EXAM: CHEST  2 VIEW COMPARISON:  04/13/2016 FINDINGS: Previous median sternotomy and CABG. Bilateral pulmonary edema pattern. Small bilateral effusions. Pneumonia not excluded but not favored. No significant bone finding. IMPRESSION: Pulmonary edema and developing bilateral effusions. Electronically Signed   By: Paulina Fusi M.D.   On: 03/30/2017 12:04   Dg Chest Port 1 View  Result Date: 03/31/2017 CLINICAL DATA:  Shortness of breath.  Hypertension. EXAM: PORTABLE CHEST 1 VIEW COMPARISON:  March 30, 2017 FINDINGS: There are bilateral pleural effusions. There is pulmonary edema in the lower lung zones. There is less perihilar edema compared to 1 day prior. There is airspace consolidation in the left lower lobe and medial right base regions. There is cardiomegaly. There is mild pulmonary venous hypertension. Patient is status post coronary artery bypass grafting. No adenopathy.  No bone lesions. IMPRESSION: Congestive heart failure with interstitial edema and effusions. There is less perihilar alveolar edema compared to 1 day prior. There is airspace consolidation in the left lower lobe and medial right base. Suspect superimposed atelectasis and potential pneumonia in these areas. Stable cardiac silhouette. Patient is status post coronary artery bypass grafting. Electronically Signed   By: Bretta Bang III M.D.   On: 03/31/2017 07:35    Cardiac Studies   Echocardiogram 03/31/17: Study Conclusions - Left ventricle: The cavity size was normal. Wall thickness was normal. Systolic function was moderately to severely reduced. The estimated ejection fraction was in the range of 30% to 35%. Wall motion was normal; there were no regional wall motion abnormalities. Features are consistent with a pseudonormal left ventricular filling pattern, with concomitant abnormal relaxation and increased filling pressure (grade 2 diastolic dysfunction). - Left atrium: The atrium was mildly dilated. - Right atrium: The atrium was mildly dilated.   Echocardiogram 04/14/16: Study Conclusions - Left ventricle: The cavity size was normal. Wall thickness was normal. The estimated ejection fraction was 55%. Left ventricular diastolic function parameters were normal. - Left atrium: The atrium was mildly dilated. - Atrial septum: No defect or patent foramen ovale was identified.   Myoview 10/23/13: 1. Negative for pharmacologic-stress induced ischemia. 2. Left ventricular ejection fraction 48%.   Patient Profile     56 y.o. male w widespread atherosclerotic disease, CAD s/p CABG 2010, PAD of carotids and lower extremities, presents with first episode of heart failure and new drop in LVEF, suspicious for loss of SVG to OM and PDA. CHF resolved with diuresis. Had transient acute kidney injury, rapidly improving.  Assessment & Plan    Again offered cardiac cath  and possible PCI/stent during this admission (today), but he declined. He is quite headstrong and not very trusting. He agrees to return for elective cath and possible revascularization next Tuesday at 0730h. He will have to show up at registration at 0530h. Procedure will be performed by Dr. Herbie Baltimore or Dr. Verdis Prime. He is very worried that he will be given sufficient local anesthesia and sedation to avoid pain. He had a bad experience with one of his previous caths. He understands that if he has PCI, he will need to spend the night afterwards in  the hospital. Will continue to hold the lisinopril until cath. This procedure has been fully reviewed with the patient and written informed consent has been obtained. OK for DC today from my point of view. Again reviewed the importance of sodium restriction and weight monitoring, compliance with meds.  For questions or updates, please contact [facility] Please consult [email or web address] for contact info under Cardiology/STEMI.      Signed, Thurmon Fair, MD  04/01/2017, 10:53 AM

## 2017-04-01 NOTE — Progress Notes (Signed)
Family Medicine Teaching Service Daily Progress Note Intern Pager: [phone]  Patient name: Randy [name] Medical record number: [id number] Date of birth: 01-10-1961 Age: 56 y.o. Gender: male  Primary Care Provider: Howard Pouch, MD Consultants: none Code Status: FULL  Pt Overview and Major Events to Date:  Patient is a 56 y.o. Male who presented for evaluation of SOB and cough. Patient with echo which showed LV of 30-35%. Pulmonary artery pressure >50. Has been receiving diuresis since admission with IV lasix diuresis. Patient satting above 95% on room air, with no shortness of breath. He has a [facility] cath scheduled for 10/9 with Cardiology. Has scheduled follow up for 10/8 at [facility] practice.  Assessment and Plan: Patient is a 56 y.o. male with a pMHx of MI, CAD, stroke, HLD, HTN, tobacco and ETOH abuse, AAA w/o rupture, GERD and PVD who presented to the ED for evaluation of worsening dyspnea.   Dyspnea: Improved. likely due to CHF. Patient with echo of 30-35%. Patient switched to oral lasix overnight, still satting above 92% on room air. Patient with outpatient heart cath scheduled for 10/9. Has follow up with family medicine on 10/8. Appropriate for discharge.  CAD s/p CABG 10/11/2008 and open endarterectomy, left and right  carotid artery stenting. Patient also has a history of AAA measuring 4.3 (02/2016) unchanged from 2014. EKG with some T wave changes. -continue aspirin, lipitor, zetia carvedilol, plavix. -Stopping lisinopril for now, can likely resume after left heart cath  Hyperlipidemia -Patient states out of atorvastatin at home  HTN 180/112 (10/1). Patient reports not taking BP medications yesterday. Today 126/79  -continue Lisinopril 40 mg daily  GERD Home ranitidine. -continue home medications  Alcohol Abuse Patient drinks 4-5 beers a day typically, but has decreased his intake in the past 2 months. Patient states last drink was on 9/29 with 1 beer. No h/o DT or  severe withdrawal, will be safe and watch patient.  -CIWA protocol   Tobacco Abuse  Patient reports quitting 2 months ago.  -no need for nicotine patch at this time. Can re-consider per patient preference.   FEN/GI: heart healthy diet, encouraged low salt  Prophylaxis: Lovenox  Disposition: Continued Management   Subjective:  Doing well this morning. No respiratory issues.   Objective: Temp:  [97.7 F (36.5 C)-98.2 F (36.8 C)] 98.2 F (36.8 C) (10/03 0600) Pulse Rate:  [61-75] 73 (10/03 0600) Resp:  [18-20] 20 (10/03 0600) BP: (99-131)/(58-89) 126/79 (10/03 0600) SpO2:  [94 %-96 %] 95 % (10/03 0600) Weight:  [182 lb 4.8 oz (82.7 kg)] 182 lb 4.8 oz (82.7 kg) (10/03 0500) Physical Exam: General: Alert and awake. Answers questions appropriately. Sitting upright in bed, relaxed. Cardiovascular: Regular rate and rhythm. No JVD. No edema Respiratory: Breath sounds quiet, bilateral fine crackles at bases. Abdomen: Soft, non-tender. BS are present.   Extremities: DP pulses intact. No LE edema or erythema.  Neuro: no focal neuro deficit   Laboratory:  Recent Labs Lab 03/30/17 1105 03/31/17 0523 03/31/17 1518  WBC 7.4 8.9 8.1  HGB 13.4 13.5 12.5*  HCT 42.6 41.7 38.8*  PLT 252 265 238    Recent Labs Lab 03/30/17 1105 03/31/17 0523 03/31/17 1518 04/01/17 0415  NA 136 134*  --  135  K 3.9 3.5  --  3.4*  CL 101 99*  --  97*  CO2 26 28  --  27  BUN 13 16  --  16  CREATININE 1.15 1.52* 1.71* 1.36*  CALCIUM 8.6* 8.5*  --  8.3*  GLUCOSE 106* 127*  --  103*     Imaging/Diagnostic Tests: No results found.   Myrene Buddy, MD 04/01/2017, 9:54 AM [place] [facility] FPTS Intern pager: [phone], text pages welcome

## 2017-04-01 NOTE — Discharge Instructions (Addendum)
You are being discharged with the diagnosis of shortness of breath secondary to congestive heart failure Changes to your medication are as follows Please take carvedilol 12.5mg  bid Please take lipitor  daily Please take Zetia  daily Please take your zantac Please stop taking your lisinopril until your heart catheterization on 10/9 If you develop any shortness of breath, difficulty breathing, or any other concerning symptoms please call your pcp or come to the emergency department   Your cardiac catheterization is scheduled for 04/07/2017 at [facility]. Please arrive to short-stay at 5:30AM. Please hold your Lasix that day. Call the office at [phone] with any questions you have regarding the procedure. You will need someone present during the hospital while your procedure is being performed and someone to drive you home.      Heart Failure  Heart failure means your heart has trouble pumping blood. This makes it hard for your body to work well. Heart failure is usually a long-term (chronic) condition. You must take good care of yourself and follow your doctor's treatment plan. Follow these instructions at home:  Take your heart medicine as told by your doctor. ? Do not stop taking medicine unless your doctor tells you to. ? Do not skip any dose of medicine. ? Refill your medicines before they run out. ? Take other medicines only as told by your doctor or pharmacist.  Stay active if told by your doctor. The elderly and people with severe heart failure should talk with a doctor about physical activity.  Eat heart-healthy foods. Choose foods that are without trans fat and are low in saturated fat, cholesterol, and salt (sodium). This includes fresh or frozen fruits and vegetables, fish, lean meats, fat-free or low-fat dairy foods, whole grains, and high-fiber foods. Lentils and dried peas and beans (legumes) are also good choices.  Limit salt if told by your doctor.  Cook in a  healthy way. Roast, grill, broil, bake, poach, steam, or stir-fry foods.  Limit fluids as told by your doctor.  Weigh yourself every morning. Do this after you pee (urinate) and before you eat breakfast. Write down your weight to give to your doctor.  Take your blood pressure and write it down if your doctor tells you to.  Ask your doctor how to check your pulse. Check your pulse as told.  Lose weight if told by your doctor.  Stop smoking or chewing tobacco. Do not use gum or patches that help you quit without your doctor's approval.  Schedule and go to doctor visits as told.  Nonpregnant women should have no more than 1 drink a day. Men should have no more than 2 drinks a day. Talk to your doctor about drinking alcohol.  Stop illegal drug use.  Stay current with shots (immunizations).  Manage your health conditions as told by your doctor.  Learn to manage your stress.  Rest when you are tired.  If it is really hot outside: ? Avoid intense activities. ? Use air conditioning or fans, or get in a cooler place. ? Avoid caffeine and alcohol. ? Wear loose-fitting, lightweight, and light-colored clothing.  If it is really cold outside: ? Avoid intense activities. ? Layer your clothing. ? Wear mittens or gloves, a hat, and a scarf when going outside. ? Avoid alcohol.  Learn about heart failure and get support as needed.  Get help to maintain or improve your quality of life and your ability to care for yourself as needed. Contact a doctor if:  You gain weight quickly.  You are more short of breath than usual.  You cannot do your normal activities.  You tire easily.  You cough more than normal, especially with activity.  You have any or more puffiness (swelling) in areas such as your hands, feet, ankles, or belly (abdomen).  You cannot sleep because it is hard to breathe.  You feel like your heart is beating fast (palpitations).  You get dizzy or light-headed when  you stand up. Get help right away if:  You have trouble breathing.  There is a change in mental status, such as becoming less alert or not being able to focus.  You have chest pain or discomfort.  You faint. This information is not intended to replace advice given to you by your health care provider. Make sure you discuss any questions you have with your health care provider.

## 2017-04-02 ENCOUNTER — Other Ambulatory Visit: Payer: Self-pay | Admitting: *Deleted

## 2017-04-02 DIAGNOSIS — I63019 Cerebral infarction due to thrombosis of unspecified vertebral artery: Secondary | ICD-10-CM

## 2017-04-02 NOTE — Telephone Encounter (Signed)
Left message on nurse line stating he is out of medication and need it today.  Clovis Pu, RN

## 2017-04-03 MED ORDER — EZETIMIBE 10 MG PO TABS: 10.0000 mg | ORAL_TABLET | Freq: Every day | ORAL | 3 refills | Status: DC

## 2017-04-03 MED ORDER — ATORVASTATIN CALCIUM 80 MG PO TABS: 80.0000 mg | ORAL_TABLET | Freq: Every day | ORAL | 0 refills | Status: DC

## 2017-04-06 ENCOUNTER — Ambulatory Visit: Payer: Medicaid Other | Admitting: Student in an Organized Health Care Education/Training Program

## 2017-04-06 ENCOUNTER — Telehealth: Payer: Self-pay

## 2017-04-06 ENCOUNTER — Telehealth: Payer: Self-pay | Admitting: Cardiology

## 2017-04-06 NOTE — Discharge Summary (Addendum)
Family Medicine Teaching [facility] Discharge Summary  Patient name: Randy [name] Medical record number: [id number] Date of birth: 16-Apr-1961 Age: 56 y.o. Gender: male Date of Admission: 03/30/2017  Date of Discharge: 04/01/2017 Admitting Physician: Nestor Ramp, MD  Primary Care Provider: Howard Pouch, MD Consultants: cardiology  Indication for Hospitalization:  Dyspnea Shortness of breath  Discharge Diagnoses/Problem List:  Dyspnea Hyperlipidemia Hypertension gerd Alcohol abuse Tobacco absue  Disposition: home  Discharge Condition: good  Discharge Exam:  General: Alert and awake. Answers questions appropriately. Sitting upright in bed, relaxed. Cardiovascular: Regular rate and rhythm. No JVD. No edema Respiratory: Breath sounds quiet, bilateral fine crackles at bases. Abdomen: Soft, non-tender. BS are present.   Extremities: DP pulses intact. No LE edema or erythema.  Neuro: no focal neuro deficit   Brief Hospital Course:  56 year old male who presents with shortness of breath for 1 week prior to admission. His PMH is significant for MI, CAD, stroke, HLD, HTN, tobacco abuse, AAA without rupture, GERd, and PVD. BNP ~1600. Patient was starte3d on IV lasix  BID for diuresis. He had significant diuresis which greatly improved his shortness of breath. He was transitioned over to oral lasix  bid on 10/2. He underwent echo on 10/2 which showed 30-35% LVEF and grade II diastolic dysfunction. He was urged to have a L and R heart cath while inpatient by cardiology but patient adamantly refused to undergo this procedure. He did agree to have it done as an outpatient. On 10/3 the patient's dyspnea resolved and he was feeling well enough to go home. He was satting >92% on room air all morning and was afebrile. He had a scheduled follow up for heart cath on 10/9 at 7:15. He also had a scheduled follow up on 10/8 with Dr. Mosetta Putt at [facility] health.  Issues for Follow Up:  1. Follow up on  10/9 for heart cath 2. Follow up respiratory status  Significant Procedures: TTE  Significant Labs and Imaging:   Recent Labs Lab 03/31/17 0523 03/31/17 1518  WBC 8.9 8.1  HGB 13.5 12.5*  HCT 41.7 38.8*  PLT 265 238    Recent Labs Lab 03/31/17 0523 03/31/17 1518 04/01/17 0415 04/01/17 0730  NA 134*  --  135  --   K 3.5  --  3.4*  --   CL 99*  --  97*  --   CO2 28  --  27  --   GLUCOSE 127*  --  103*  --   BUN 16  --  16  --   CREATININE 1.52* 1.71* 1.36*  --   CALCIUM 8.5*  --  8.3*  --   MG 1.9  --   --  1.8    Results/Tests Pending at Time of Discharge: none  Discharge Medications:  Allergies as of 04/01/2017   No Known Allergies     Medication List    STOP taking these medications   lisinopril 40 MG tablet Commonly known as:  PRINIVIL,ZESTRIL   omeprazole 20 MG capsule Commonly known as:  PRILOSEC     TAKE these medications   aspirin EC 81 MG tablet Take 1 tablet (81 mg total) by mouth daily.   carvedilol 12.5 MG tablet Commonly known as:  COREG TAKE ONE TABLET BY MOUTH TWICE DAILY WITH A MEAL   clopidogrel 75 MG tablet Commonly known as:  PLAVIX Take 1 tablet (75 mg total) by mouth daily with breakfast.   diphenhydramine-acetaminophen 25-500 MG Tabs tablet Commonly known  as:  TYLENOL PM Take 2 tablets by mouth at bedtime as needed.   furosemide 40 MG tablet Commonly known as:  LASIX Take 1 tablet (40 mg total) by mouth 2 (two) times daily.   MUCINEX CONGEST & COUGH CHILD 2.5-5-100 MG/5ML Liqd Generic drug:  Phenylephrine-DM-GG Take 10 mLs by mouth every 4 (four) hours as needed (for cough).   ranitidine 300 MG tablet Commonly known as:  ZANTAC Take 1 tablet (300 mg total) by mouth daily.       Discharge Instructions: Please refer to Patient Instructions section of EMR for full details.  Patient was counseled important signs and symptoms that should prompt return to medical care, changes in medications, dietary instructions, activity  restrictions, and follow up appointments.   Follow-Up Appointments: Please follow up with Dr. Mosetta Putt on either 10/8. Please keep your appointment with Dr. Phillips Odor for elective cath on 10/9   Myrene Buddy, MD 04/06/2017, 11:30 PM PGY-1, [facility] Family Medicine Teaching Service  Discharge Note : Attending Denny Levy MD Pager [phone] Office [phone] I have seen and examined this patient, reviewed their chart and discussed discharge planning with the resident at the time of discharge. I agree with the discharge plan as above.

## 2017-04-06 NOTE — Telephone Encounter (Signed)
error 

## 2017-04-06 NOTE — Telephone Encounter (Signed)
New message     Patient daughter Herbert Seta called to cancel cath lab appt for 10/9. Patient states he changed his mind, no longer wants procedure.

## 2017-04-06 NOTE — Telephone Encounter (Signed)
Error

## 2017-04-06 NOTE — Telephone Encounter (Signed)
Patient contacted pre-catheterization at [facility] scheduled for:  04/07/2017 @ 0730 Verified arrival time and place:  [facility] @ 0530 Confirmed AM meds to be taken pre-cath with sip of water: Take ASA/Plavix/carvedilol Hold lasix Confirmed patient has responsible person to drive home post procedure and observe patient for 24 hours:  yes Addl concerns:  None noted

## 2017-04-07 ENCOUNTER — Encounter (HOSPITAL_COMMUNITY): Admission: RE | Payer: Self-pay | Source: Ambulatory Visit

## 2017-04-07 ENCOUNTER — Ambulatory Visit (HOSPITAL_COMMUNITY): Admission: RE | Admit: 2017-04-07 | Payer: Medicaid Other | Source: Ambulatory Visit | Admitting: Cardiology

## 2017-04-07 SURGERY — LEFT HEART CATH AND CORS/GRAFTS ANGIOGRAPHY
Anesthesia: LOCAL

## 2017-07-07 ENCOUNTER — Encounter: Payer: Self-pay | Admitting: Student in an Organized Health Care Education/Training Program

## 2017-07-07 ENCOUNTER — Other Ambulatory Visit: Payer: Self-pay

## 2017-07-07 ENCOUNTER — Ambulatory Visit: Payer: Medicaid Other | Admitting: Student in an Organized Health Care Education/Training Program

## 2017-07-07 VITALS — BP 142/82 | HR 71 | Temp 98.5°F | Ht 71.0 in | Wt 192.0 lb

## 2017-07-07 DIAGNOSIS — F411 Generalized anxiety disorder: Secondary | ICD-10-CM

## 2017-07-07 DIAGNOSIS — I5020 Unspecified systolic (congestive) heart failure: Secondary | ICD-10-CM | POA: Diagnosis present

## 2017-07-07 MED ORDER — POTASSIUM CHLORIDE CRYS ER 10 MEQ PO TBCR: 20.0000 meq | EXTENDED_RELEASE_TABLET | Freq: Every day | ORAL | 0 refills | Status: DC

## 2017-07-07 MED ORDER — FUROSEMIDE 40 MG PO TABS: 40.0000 mg | ORAL_TABLET | Freq: Every day | ORAL | 0 refills | Status: DC

## 2017-07-07 MED ORDER — CITALOPRAM HYDROBROMIDE 20 MG PO TABS: 20.0000 mg | ORAL_TABLET | Freq: Every day | ORAL | 0 refills | Status: DC

## 2017-07-07 NOTE — Progress Notes (Signed)
CC: Dyspnea  HPI: Randy [name] is a 57 y.o. male with PMH significant for CHF, PVD, HTN, CVA, CAD who presents to [facility] today with dyspnea of 2 weeks duration.   Dyspnea Patient presents with 2 weeks of dyspnea that is worst when he lies flat. He reports he requires 6 pillows at night. He reports sometimes when he is sleeping he wakes with air hunger and must stand up to breath. He reports these cause him to have panic attacks.  Patient was recently hospitalized in 03/30/2017 with CHF exacerbation, at which time he was noted to have cardiac echo that showed 30-35% LVEF and grade II diastolic dysfunction. He was urged to have a L and R heart cath while inpatient by cardiology but patient adamantly refused to undergo this procedure. He did agree to have it done as an outpatient, however notes indicate he left without having the procedure completed as an outpatient the following week.  He was discharged with 40 mg lasix BID which he took for 15 days and then never came in for follow up appointments and discontinued use of the medication. He reports today that he left the cardiac cath because "they were only going to give me a valium."  Anxiety/Insomnia Patient reports he has difficulty sleeping due to breathing and this has made him have anxiety throughout the day as well. He states he has "panic attacks." He states we have discussed sleep hygiene in the past and I just "brushed him off" and did not take his complaint of insomnia seriously. He feels this is interfering with his daily function.  Review of Symptoms:  See HPI for ROS.   CC, SH/smoking status, and VS noted.  Objective: BP (!) 142/82   Pulse 71   Temp 98.5 F (36.9 C) (Oral)   Ht 5\' 11"  (1.803 m)   Wt 192 lb (87.1 kg)   SpO2 93% Comment: ambulatory  BMI 26.78 kg/m  GEN: NAD, alert, cooperative, and pleasant. NECK: full ROM, no thyromegaly RESPIRATORY: clear to auscultation bilaterally with no wheezes, rhonchi or rales, good  effort, comfortable work of breathing CV: RRR, no m/r/g, no peripheral edema GI: soft, non-tender, non-distended, no hepatosplenomegaly SKIN: warm and dry, no rashes or lesions NEURO: II-XII grossly intact, normal gait, peripheral sensation intact PSYCH: AAOx3  Assessment and plan:  Systolic heart failure (HCC) Weight 192 from 182 at hospital discharge 03/2017. Patient dose not appear particularly overloaded on exam with clear lungs and no pitting edema in LE. It is concerning that he is waking with air hunger throughout the night. - restart lasix 40 mg to be taken daily - 10 mg KDUR daily with lasix given that potassium was on the low side when last on lasix - strict red flags/return precautions were discussed - patient agrees to make follow up appointment in 2 weeks as he leaves today, but will come in sooner if symptoms worsen or fail to improve - pt agrees to call and schedule follow up with cardiology - check BMP today to monitor K  Anxiety state Patient reports panic attacks with being unable to breath, then he states this is very much interfering with daily functioning. He states he is feeling anxious daily. He asks for medication to help him sleep, indicating he has taken over the prescribed dose of melatonin. - Discussed importance of following prescription guidelines with patient - discussed dangers of using sedating medications if he has PND or worsened respiratory status. - in order to help control his  anxiety, we can start citalopram 20 mg daily. Explained this is not a sleep medication and taking more than the prescribed dose will not help him sleep, it will be dangerous. Patient very adamantly states he will take the medication only as prescribed. - follow up in 2 weeks to reassess mood. - check BMP today to monitor sodium   Orders Placed This Encounter  Procedures  . Basic Metabolic Panel    Meds ordered this encounter  Medications  . furosemide (LASIX) 40 MG tablet      Sig: Take 1 tablet (40 mg total) by mouth daily.    Dispense:  30 tablet    Refill:  0  . citalopram (CELEXA) 20 MG tablet    Sig: Take 1 tablet (20 mg total) by mouth daily.    Dispense:  30 tablet    Refill:  0  . potassium chloride SA (K-DUR,KLOR-CON) 10 MEQ tablet    Sig: Take 2 tablets (20 mEq total) by mouth daily.    Dispense:  30 tablet    Refill:  0    Howard Pouch, MD,MS,  PGY2 07/08/2017 1:55 PM

## 2017-07-07 NOTE — Patient Instructions (Addendum)
It was a pleasure seeing you today in our clinic. Here is the treatment plan we have discussed and agreed upon together:  Please call cardiology to follow up with them  Take your Lasix pill once daily  Take potassium pill once daily  Please take anxiety medication once daily  If you develop worsening shortness of breath or swelling in your legs please give our office a call to come back sooner. Otherwise please schedule an appointment on your way out to be seen in 2 weeks  We drew blood work at today's visit. I will call or send you a letter with these results. If you do not hear from me within the next week, please give our office a call.  Our clinic's number is [phone]. Please call with questions or concerns about what we discussed today.  Be well, Dr. Mosetta Putt

## 2017-07-08 ENCOUNTER — Encounter: Payer: Self-pay | Admitting: Student in an Organized Health Care Education/Training Program

## 2017-07-08 DIAGNOSIS — I5020 Unspecified systolic (congestive) heart failure: Secondary | ICD-10-CM | POA: Insufficient documentation

## 2017-07-08 DIAGNOSIS — I5022 Chronic systolic (congestive) heart failure: Secondary | ICD-10-CM | POA: Insufficient documentation

## 2017-07-08 HISTORY — DX: Chronic systolic (congestive) heart failure: I50.22

## 2017-07-08 LAB — BASIC METABOLIC PANEL
BUN/Creatinine Ratio: 17 (ref 9–20)
BUN: 19 mg/dL (ref 6–24)
CO2: 23 mmol/L (ref 20–29)
Calcium: 8.7 mg/dL (ref 8.7–10.2)
Chloride: 106 mmol/L (ref 96–106)
Creatinine, Ser: 1.1 mg/dL (ref 0.76–1.27)
GFR calc Af Amer: 85 mL/min/{1.73_m2} (ref >59)
GFR calc non Af Amer: 74 mL/min/{1.73_m2} (ref >59)
GLUCOSE: 110 mg/dL — AB (ref 65–99)
Potassium: 4.7 mmol/L (ref 3.5–5.2)
Sodium: 141 mmol/L (ref 134–144)

## 2017-07-08 NOTE — Assessment & Plan Note (Addendum)
Patient reports panic attacks with being unable to breath, then he states this is very much interfering with daily functioning. He states he is feeling anxious daily. He asks for medication to help him sleep, indicating he has taken over the prescribed dose of melatonin. - Discussed importance of following prescription guidelines with patient - discussed dangers of using sedating medications if he has PND or worsened respiratory status. - in order to help control his anxiety, we can start citalopram 20 mg daily. Explained this is not a sleep medication and taking more than the prescribed dose will not help him sleep, it will be dangerous. Patient very adamantly states he will take the medication only as prescribed. - follow up in 2 weeks to reassess mood. - check BMP today to monitor sodium

## 2017-07-08 NOTE — Assessment & Plan Note (Addendum)
Weight 192 from 182 at hospital discharge 03/2017. Patient dose not appear particularly overloaded on exam with clear lungs and no pitting edema in LE. It is concerning that he is waking with air hunger throughout the night. - restart lasix 40 mg to be taken daily - 10 mg KDUR daily with lasix given that potassium was on the low side when last on lasix - strict red flags/return precautions were discussed - patient agrees to make follow up appointment in 2 weeks as he leaves today, but will come in sooner if symptoms worsen or fail to improve - pt agrees to call and schedule follow up with cardiology - check BMP today to monitor K

## 2017-08-03 ENCOUNTER — Other Ambulatory Visit: Payer: Self-pay | Admitting: Student in an Organized Health Care Education/Training Program

## 2017-08-03 DIAGNOSIS — I5020 Unspecified systolic (congestive) heart failure: Secondary | ICD-10-CM

## 2017-08-03 MED ORDER — CITALOPRAM HYDROBROMIDE 20 MG PO TABS: 20.0000 mg | ORAL_TABLET | Freq: Every day | ORAL | 0 refills | Status: DC

## 2017-08-03 MED ORDER — POTASSIUM CHLORIDE CRYS ER 10 MEQ PO TBCR
20.0000 meq | EXTENDED_RELEASE_TABLET | Freq: Every day | ORAL | 0 refills | Status: DC
Start: 2017-08-03 — End: 2017-08-10

## 2017-08-03 MED ORDER — FUROSEMIDE 40 MG PO TABS: 40.0000 mg | ORAL_TABLET | Freq: Every day | ORAL | 0 refills | Status: DC

## 2017-08-03 NOTE — Telephone Encounter (Signed)
Refills ordered. He will need an appointment since I can't treat rash without seeing and evaluating.

## 2017-08-03 NOTE — Telephone Encounter (Signed)
Needs refills on furseomide, citalopram and potassium.  He has an appt 08-01-17.  [facility] He also has a rash on the side of his head that has some boils coming out. Please advise

## 2017-08-04 NOTE — Telephone Encounter (Signed)
Informed pt of below and asked if he wanted to try and come in sooner, he said he did not want to come in and have to wait until someone has a no show and I told him I could try and make an appointment for him with a doctor on the same team and he said he would just wait to see her and I told him if it got worse to give [place] a call and we would try and get him in sooner. Lamonte Sakai, Bryanne Riquelme D, [place]

## 2017-08-10 NOTE — Addendum Note (Signed)
Addended by: Nigel Mormon on: 08/10/2017 05:18 PM   Modules accepted: Orders

## 2017-08-10 NOTE — Telephone Encounter (Signed)
Pt went to get his prescriptions but potassium chloride was not there at [facility]. Please advise

## 2017-08-11 MED ORDER — POTASSIUM CHLORIDE CRYS ER 10 MEQ PO TBCR
10.0000 meq | EXTENDED_RELEASE_TABLET | Freq: Every day | ORAL | 0 refills | Status: DC
Start: 2017-08-11 — End: 2017-10-23

## 2017-08-11 NOTE — Telephone Encounter (Signed)
Pt has an appointment on 08/21/2017 with Dr. Mosetta Putt. Sunday Spillers, CMA

## 2017-08-11 NOTE — Telephone Encounter (Signed)
Will refill potassium. Patient was asked to make an appointment for follow up in 2 weeks when he was last seen in office 1/8. Can he make an appt?

## 2017-08-19 ENCOUNTER — Other Ambulatory Visit: Payer: Self-pay | Admitting: *Deleted

## 2017-08-19 DIAGNOSIS — I63019 Cerebral infarction due to thrombosis of unspecified vertebral artery: Secondary | ICD-10-CM

## 2017-08-20 MED ORDER — ATORVASTATIN CALCIUM 80 MG PO TABS
80.0000 mg | ORAL_TABLET | Freq: Every day | ORAL | 2 refills | Status: DC
Start: 2017-08-20 — End: 2018-02-01

## 2017-08-21 ENCOUNTER — Other Ambulatory Visit: Payer: Self-pay

## 2017-08-21 ENCOUNTER — Ambulatory Visit: Payer: Medicaid Other | Admitting: Student in an Organized Health Care Education/Training Program

## 2017-08-21 ENCOUNTER — Encounter: Payer: Self-pay | Admitting: Student in an Organized Health Care Education/Training Program

## 2017-08-21 VITALS — BP 130/82 | HR 80 | Temp 98.0°F | Ht 71.0 in | Wt 183.0 lb

## 2017-08-21 DIAGNOSIS — I5020 Unspecified systolic (congestive) heart failure: Secondary | ICD-10-CM

## 2017-08-21 MED ORDER — LISINOPRIL 10 MG PO TABS: 10.0000 mg | ORAL_TABLET | Freq: Every day | ORAL | 3 refills | Status: DC

## 2017-08-21 MED ORDER — FUROSEMIDE 20 MG PO TABS: 20.0000 mg | ORAL_TABLET | Freq: Every day | ORAL | 3 refills | Status: DC

## 2017-08-21 NOTE — Progress Notes (Signed)
   CC: CHF follow up  HPI: Randy [name] is a 57 y.o. male with PMH significant for AAA, PVD, HTN, CHF who presents to [facility] today for follow up CHF.   Patient was recently hospitalized in 03/30/2017 with CHF exacerbation, at which time he was noted to have cardiac echo that showed 30-35% LVEF and grade II diastolic dysfunction. He was urged to have a L and R heart cath while inpatient by cardiology but patient adamantly refused to undergo this procedure. He did agree to have it done as an outpatient, however notes indicate he left without having the procedure completed as an outpatient the following week.  Patient was seen 1/8 at which time his weight was 192 lb and he was feeling short of breath thought to be consistent with volume overload. He was prescribed 40 mg lasix daily and asked to follow up in 2 weeks, however he states he had difficulty following up due to inability to get an appointment.   He reports that since his previous visit he has felt improvement in his breathing. He can now lie with only 2 pillows, which is an improvement from previous.  He denies LE edema. He is agreeable to following up with cardiology.   Review of Symptoms:  See HPI for ROS.   CC, SH/smoking status, and VS noted.  Objective: BP 130/82   Pulse 80   Temp 98 F (36.7 C) (Oral)   Ht 5\' 11"  (1.803 m)   Wt 183 lb (83 kg)   SpO2 99%   BMI 25.52 kg/m  GEN: NAD, alert, cooperative, and pleasant. RESPIRATORY: clear to auscultation bilaterally with no wheezes, rhonchi or rales, good effort CV: RRR, no m/r/g, no peripheral edema, no JVD GI: soft, non-tender, non-distended, no hepatosplenomegaly SKIN: warm and dry, no rashes or lesions PSYCH: AAOx3, appropriate affect  Assessment and plan:  Systolic heart failure [facility]) Patient agreeable to following up with cardiology. Has not had a workup for new onset heart failure yet due to walking out of heart cath - but he does have a lot of questions about pursuing  further workup. - pt appears euvolemic, will decrease lasix to 20 mg daily and have him follow up in 2 weeks - start ACE today, check BMP today and in 1-2 weeks  - follow up precautions advised    Orders Placed This Encounter  Procedures  . Basic metabolic panel  . Basic metabolic panel    Standing Status:   Future    Standing Expiration Date:   08/21/2018    Meds ordered this encounter  Medications  . furosemide (LASIX) 20 MG tablet    Sig: Take 1 tablet (20 mg total) by mouth daily.    Dispense:  30 tablet    Refill:  3  . lisinopril (PRINIVIL,ZESTRIL) 10 MG tablet    Sig: Take 1 tablet (10 mg total) by mouth daily.    Dispense:  90 tablet    Refill:  3     Howard Pouch, MD,MS,  PGY2 08/28/2017 9:47 AM

## 2017-08-21 NOTE — Patient Instructions (Addendum)
It was a pleasure seeing you today in our clinic.  Here is the treatment plan we have discussed and agreed upon together:  Please call and come back in one week for a lab visit and blood draw.  Please call cardiology to be seen for your questions regarding a cardiac catheterization.  Take 20 mg lasix daily.  Take your new medication called lisinopril.  If you have worsening shortness of breath or swelling, call our office right away so we may adjust the dose of your lasix.  We drew blood work at today's visit. I will call or send you a letter with these results. If you do not hear from me within the next week, please give our office a call.  Our clinic's number is [phone]. Please call with questions or concerns about what we discussed today.  Be well, Dr. Mosetta Putt

## 2017-08-22 LAB — BASIC METABOLIC PANEL
BUN/Creatinine Ratio: 11 (ref 9–20)
BUN: 14 mg/dL (ref 6–24)
CALCIUM: 9.5 mg/dL (ref 8.7–10.2)
CO2: 27 mmol/L (ref 20–29)
CREATININE: 1.25 mg/dL (ref 0.76–1.27)
Chloride: 98 mmol/L (ref 96–106)
GFR calc Af Amer: 73 mL/min/{1.73_m2} (ref >59)
GFR calc non Af Amer: 63 mL/min/{1.73_m2} (ref >59)
GLUCOSE: 117 mg/dL — AB (ref 65–99)
Potassium: 4.8 mmol/L (ref 3.5–5.2)
Sodium: 140 mmol/L (ref 134–144)

## 2017-08-24 ENCOUNTER — Telehealth: Payer: Self-pay

## 2017-08-24 NOTE — Telephone Encounter (Signed)
Received fax from [facility] pharmacy requesting prior authorization of Atorvastatin 40 mg. Form placed in MD's box for completion along with Medicaid formulary. Ples Specter, RN [facility] RN)

## 2017-08-28 ENCOUNTER — Other Ambulatory Visit: Payer: Medicaid Other

## 2017-08-28 DIAGNOSIS — I5020 Unspecified systolic (congestive) heart failure: Secondary | ICD-10-CM

## 2017-08-28 NOTE — Assessment & Plan Note (Addendum)
Patient agreeable to following up with cardiology. Has not had a workup for new onset heart failure yet due to walking out of heart cath - but he does have a lot of questions about pursuing further workup. - pt appears euvolemic, will decrease lasix to 20 mg daily and have him follow up in 2 weeks - start ACE today, check BMP today and in 1-2 weeks  - follow up precautions advised

## 2017-08-29 ENCOUNTER — Other Ambulatory Visit: Payer: Self-pay | Admitting: Student in an Organized Health Care Education/Training Program

## 2017-08-29 DIAGNOSIS — I5020 Unspecified systolic (congestive) heart failure: Secondary | ICD-10-CM

## 2017-08-29 LAB — BASIC METABOLIC PANEL
BUN / CREAT RATIO: 13 (ref 9–20)
BUN: 19 mg/dL (ref 6–24)
CO2: 23 mmol/L (ref 20–29)
CREATININE: 1.43 mg/dL — AB (ref 0.76–1.27)
Calcium: 9.3 mg/dL (ref 8.7–10.2)
Chloride: 104 mmol/L (ref 96–106)
GFR, EST AFRICAN AMERICAN: 62 mL/min/{1.73_m2} (ref >59)
GFR, EST NON AFRICAN AMERICAN: 54 mL/min/{1.73_m2} — AB (ref >59)
GLUCOSE: 78 mg/dL (ref 65–99)
Potassium: 5.4 mmol/L — ABNORMAL HIGH (ref 3.5–5.2)
SODIUM: 144 mmol/L (ref 134–144)

## 2017-08-29 MED ORDER — LISINOPRIL 2.5 MG PO TABS: 2.5000 mg | ORAL_TABLET | Freq: Every day | ORAL | 0 refills | Status: DC

## 2017-08-29 NOTE — Telephone Encounter (Signed)
Called and spoke with patient regarding elevated potassium. We can dose reduce his lisinopril from 10 mg daily to 2.5 mg daily. Advised him to discontinue KDUR 10 mEq that he normally takes daily. Follow up BMP in one week, pt to call through front office and schedule lab visit on Monday.   Howard Pouch, MD PGY-2 [facility]

## 2017-09-01 ENCOUNTER — Other Ambulatory Visit: Payer: Medicaid Other

## 2017-09-01 DIAGNOSIS — I5020 Unspecified systolic (congestive) heart failure: Secondary | ICD-10-CM

## 2017-09-02 LAB — BASIC METABOLIC PANEL
BUN/Creatinine Ratio: 9 (ref 9–20)
BUN: 13 mg/dL (ref 6–24)
CO2: 25 mmol/L (ref 20–29)
Calcium: 9.1 mg/dL (ref 8.7–10.2)
Chloride: 98 mmol/L (ref 96–106)
Creatinine, Ser: 1.4 mg/dL — ABNORMAL HIGH (ref 0.76–1.27)
GFR calc Af Amer: 64 mL/min/{1.73_m2} (ref >59)
GFR calc non Af Amer: 55 mL/min/{1.73_m2} — ABNORMAL LOW (ref >59)
Glucose: 102 mg/dL — ABNORMAL HIGH (ref 65–99)
Potassium: 4.8 mmol/L (ref 3.5–5.2)
Sodium: 138 mmol/L (ref 134–144)

## 2017-09-03 NOTE — Telephone Encounter (Signed)
Atorvastatin on preferred list. Called pharmacy and was told Rx went through and the patient already picked up. Disregard PA request. Shawna Orleans, RN

## 2017-09-18 ENCOUNTER — Ambulatory Visit: Payer: Medicaid Other | Admitting: Student in an Organized Health Care Education/Training Program

## 2017-09-18 ENCOUNTER — Other Ambulatory Visit: Payer: Self-pay

## 2017-09-18 ENCOUNTER — Encounter: Payer: Self-pay | Admitting: Student in an Organized Health Care Education/Training Program

## 2017-09-18 DIAGNOSIS — I5020 Unspecified systolic (congestive) heart failure: Secondary | ICD-10-CM

## 2017-09-18 DIAGNOSIS — K219 Gastro-esophageal reflux disease without esophagitis: Secondary | ICD-10-CM | POA: Diagnosis not present

## 2017-09-18 DIAGNOSIS — E875 Hyperkalemia: Secondary | ICD-10-CM

## 2017-09-18 MED ORDER — CARVEDILOL 12.5 MG PO TABS: 12.5000 mg | ORAL_TABLET | Freq: Two times a day (BID) | ORAL | 0 refills | Status: DC

## 2017-09-18 MED ORDER — CITALOPRAM HYDROBROMIDE 20 MG PO TABS: 20.0000 mg | ORAL_TABLET | Freq: Every day | ORAL | 0 refills | Status: DC

## 2017-09-18 MED ORDER — RANITIDINE HCL 300 MG PO TABS: 300.0000 mg | ORAL_TABLET | Freq: Every day | ORAL | 0 refills | Status: DC

## 2017-09-18 MED ORDER — LISINOPRIL 2.5 MG PO TABS: 2.5000 mg | ORAL_TABLET | Freq: Every day | ORAL | 0 refills | Status: DC

## 2017-09-18 NOTE — Patient Instructions (Signed)
It was a pleasure seeing you today in our clinic. Today we discussed your heart. Here is the treatment plan we have discussed and agreed upon together:  A consult was placed to heart failure clinic at today's visit.  You will receive a call to schedule an appointment. If you do not receive a call within two weeks please call our office so we can place the consult again.  Our clinic's number is [phone]. Please call with questions or concerns about what we discussed today.  Be well, Dr. Mosetta Putt  Sign up for My Chart to have easy access to your labs results, and communication with your primary care physician.

## 2017-09-18 NOTE — Assessment & Plan Note (Addendum)
Weight up slightly at 189 lb from 183 at last visit. No dypsnea, no chest pain. Does not appear clinically overloaded on exam. Attempted to discuss GOC with patient given that he continues to refuse referral to cardiology for cardiac cath. He understands that even though he feels well, his heart is still not well and ultimately this could lead to death. He is agreeable to speaking with heart failure to optimize medication regimen, and so this was placed today.  - lisinopril (PRINIVIL,ZESTRIL) 2.5 MG tablet; Take 1 tablet (2.5 mg total) by mouth daily.  Dispense: 30 tablet; Refill: 0 - carvedilol (COREG) 12.5 MG tablet; Take 1 tablet (12.5 mg total) by mouth 2 (two) times daily with a meal.  Dispense: 180 tablet; Refill: 0 - AMB referral to CHF clinic - follow up in one month at dec dose of lisinopril for BP check and repeat labs

## 2017-09-18 NOTE — Progress Notes (Signed)
Subjective:    Randy [name] - 57 y.o. male MRN [id number]  Date of birth: 09-Jun-1961  HPI  Plutarco Matusek is here for heart failure follow up.  Patient was hospitalized in 03/30/2017 with CHF exacerbation, at which time he was noted to have cardiacecho thatshowed 30-35% LVEF and grade II diastolic dysfunction. He was urged to have a L and R heart cath while inpatient by cardiology but patient adamantly refused to undergo this procedure. He did agree to have it done as an outpatient, however notes indicate he left without having the procedure completed as an outpatient the following week.  Patient was seen 1/8 at which time his weight was 192 lb and he was feeling short of breath thought to be consistent with volume overload. He was prescribed 40 mg lasix daily, since this time his weight has improved and lasix were decreased to 20 mg daily at his last visit.  He reports that since his previous visit he has felt improvement in his breathing. He can now lie with only 2 pillows, which is an improvement from previous.  He denies LE edema. He is agreeable to following up with cardiology.   Patient reports that he has been feeling no dyspnea. No LE edema. Sleeping with only 2 pillows which is an improvement from previously during his exacerbation. Weight 189 lbs from 183 lbs at previous visit 1 month ago. Has been taking lasix 20 mg tablets daily.   Health Maintenance:  Health Maintenance Due  Topic Date Due  . Hepatitis C Screening  Feb 22, 1961  . TETANUS/TDAP  10/29/1977  . COLONOSCOPY  10/29/2008    -  reports that he quit smoking about 7 months ago. His smoking use included cigarettes. He has a 15.00 pack-year smoking history. He has never used smokeless tobacco. - Review of Systems: Per HPI. - Past Medical History: Patient Active Problem List   Diagnosis Date Noted  . Hyperkalemia 09/18/2017  . Systolic heart failure (HCC) 07/08/2017  . Acute congestive heart failure (HCC)   .  Dyspnea 03/23/2017  . Cellulitis of right ankle 01/23/2017  . Muscle spasm 10/23/2016  . Insomnia 04/11/2016  . Orthopnea 03/31/2016  . 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 HTN (hypertension) 06/15/2014  . HLD (hyperlipidemia) 04/05/2014  . PVD (peripheral vascular disease) with claudication (HCC) 05/09/2013  . GERD (gastroesophageal reflux disease) 03/28/2013  . AAA (abdominal aortic aneurysm) without rupture (HCC) 10/28/2012  . Anxiety state 02/12/2012  . Tobacco abuse 02/02/2012  . Occlusion and stenosis of carotid artery without mention of cerebral infarction 01/19/2012  . Pulmonary nodule 05/19/2011  . CAD, ARTERY BYPASS GRAFT 10/19/2008  . DISC DISEASE, LUMBAR 10/19/2008   - Medications: reviewed and updated Current Outpatient Medications  Medication Sig Dispense Refill  . aspirin EC 81 MG tablet Take 1 tablet (81 mg total) by mouth daily. 30 tablet 2  . atorvastatin (LIPITOR) 80 MG tablet Take 1 tablet (80 mg total) by mouth daily. 90 tablet 2  . carvedilol (COREG) 12.5 MG tablet Take 1 tablet (12.5 mg total) by mouth 2 (two) times daily with a meal. 180 tablet 0  . citalopram (CELEXA) 20 MG tablet Take 1 tablet (20 mg total) by mouth daily. 30 tablet 0  . clopidogrel (PLAVIX) 75 MG tablet Take 1 tablet (75 mg total) by mouth daily with breakfast. 90 tablet 4  . diphenhydramine-acetaminophen (TYLENOL PM) 25-500 MG TABS tablet Take 2 tablets by mouth at bedtime as needed.    [name]  ezetimibe (ZETIA) 10 MG tablet Take 1 tablet (10 mg total) by mouth daily. 90 tablet 3  . furosemide (LASIX) 20 MG tablet Take 1 tablet (20 mg total) by mouth daily. 30 tablet 3  . lisinopril (PRINIVIL,ZESTRIL) 2.5 MG tablet Take 1 tablet (2.5 mg total) by mouth daily. 30 tablet 0  . Phenylephrine-DM-GG (MUCINEX CONGEST & COUGH CHILD) 2.5-5-100 MG/5ML LIQD Take 10 mLs by mouth every 4 (four) hours as needed (for cough).    . potassium chloride (K-DUR,KLOR-CON) 10 MEQ  tablet Take 1 tablet (10 mEq total) by mouth daily. With lasix 30 tablet 0  . ranitidine (ZANTAC) 300 MG tablet Take 1 tablet (300 mg total) by mouth daily. 60 tablet 0   No current facility-administered medications for this visit.     Review of Systems See HPI     Objective:   Physical Exam BP 132/78   Pulse 62   Temp 98 F (36.7 C) (Oral)   Ht 5\' 11"  (1.803 m)   Wt 189 lb (85.7 kg)   SpO2 96%   BMI 26.36 kg/m  Gen: NAD, alert, cooperative with exam, well-appearing  HEENT: NCAT, PERRL, clear conjunctiva, oropharynx clear, supple neck CV: RRR, good S1/S2, no murmur, no edema, capillary refill brisk  Resp: CTABL, no wheezes, non-labored Abd: SNTND, BS present, no guarding or organomegaly Skin: no rashes, normal turgor  Neuro: no gross deficits.  Psych: good insight, alert and oriented     Assessment & Plan:   Systolic heart failure (HCC) Weight up slightly at 189 lb from 183 at last visit. No dypsnea, no chest pain. Does not appear clinically overloaded on exam. Attempted to discuss GOC with patient given that he continues to refuse referral to cardiology for cardiac cath. He understands that even though he feels well, his heart is still not well and ultimately this could lead to death. He is agreeable to speaking with heart failure to optimize medication regimen, and so this was placed today.  - lisinopril (PRINIVIL,ZESTRIL) 2.5 MG tablet; Take 1 tablet (2.5 mg total) by mouth daily.  Dispense: 30 tablet; Refill: 0 - carvedilol (COREG) 12.5 MG tablet; Take 1 tablet (12.5 mg total) by mouth 2 (two) times daily with a meal.  Dispense: 180 tablet; Refill: 0 - AMB referral to CHF clinic - follow up in one month at dec dose of lisinopril for BP check and repeat labs   Hyperkalemia Noted at last visit, resolved on repeat labs. Mild at 5.4, asymptomatic. Discontinued potassium supplementation and decreased lisinopril to 2.5 mg daily. He continued taking the 10 mg lisinopril daily  because the 2.5 mg order did not go through. - printed lisinopril 2.5 mg for him to take to the pharmacy - follow up in one month at dec dose of lisinopril for BP check and repeat labs (BMP)   Orders Placed This Encounter  Procedures  . AMB referral to CHF clinic    Referral Priority:   Routine    Referral Type:   Consultation    Number of Visits Requested:   1    Meds ordered this encounter  Medications  . ranitidine (ZANTAC) 300 MG tablet    Sig: Take 1 tablet (300 mg total) by mouth daily.    Dispense:  60 tablet    Refill:  0  . lisinopril (PRINIVIL,ZESTRIL) 2.5 MG tablet    Sig: Take 1 tablet (2.5 mg total) by mouth daily.    Dispense:  30 tablet    Refill:  0  . carvedilol (COREG) 12.5 MG tablet    Sig: Take 1 tablet (12.5 mg total) by mouth 2 (two) times daily with a meal.    Dispense:  180 tablet    Refill:  0    Please consider 90 day supplies to promote better adherence  . citalopram (CELEXA) 20 MG tablet    Sig: Take 1 tablet (20 mg total) by mouth daily.    Dispense:  30 tablet    Refill:  0    Howard Pouch, MD,MS,  PGY2 09/18/2017 5:02 PM

## 2017-09-18 NOTE — Assessment & Plan Note (Signed)
Noted at last visit, resolved on repeat labs. Mild at 5.4, asymptomatic. Discontinued potassium supplementation and decreased lisinopril to 2.5 mg daily. He continued taking the 10 mg lisinopril daily because the 2.5 mg order did not go through. - printed lisinopril 2.5 mg for him to take to the pharmacy - follow up in one month at dec dose of lisinopril for BP check and repeat labs (BMP)

## 2017-10-23 ENCOUNTER — Encounter: Payer: Self-pay | Admitting: Student in an Organized Health Care Education/Training Program

## 2017-10-23 ENCOUNTER — Other Ambulatory Visit: Payer: Self-pay

## 2017-10-23 ENCOUNTER — Ambulatory Visit: Payer: Medicaid Other | Admitting: Student in an Organized Health Care Education/Training Program

## 2017-10-23 DIAGNOSIS — K219 Gastro-esophageal reflux disease without esophagitis: Secondary | ICD-10-CM | POA: Diagnosis present

## 2017-10-23 DIAGNOSIS — Z87891 Personal history of nicotine dependence: Secondary | ICD-10-CM

## 2017-10-23 DIAGNOSIS — I5020 Unspecified systolic (congestive) heart failure: Secondary | ICD-10-CM | POA: Diagnosis not present

## 2017-10-23 NOTE — Progress Notes (Signed)
Subjective:    Randy [name] - 57 y.o. male MRN [id number]  Date of birth: 04/24/1961  HPI  Randy [name] is here for heart failure follow up.  Patient was hospitalized in 03/30/2017 with CHF exacerbation, at which time he was noted to have cardiacecho thatshowed 30-35% LVEF and grade II diastolic dysfunction. Patient is not interested in having cardiac cath which has been recommended on multiple occasions.   Today, Weight is stable at 187 lbs from 189 lbs previously. He denies dyspnea. Pillows at night at baseline. Medications 20 mg daily lasix, 2.5 mg lisionopil.   Patient had been referred to CHF clinic but we did not realize this had to go through as an epic message rather than through the work que so we can try to put that referral in again today.   Health Maintenance:  Health Maintenance Due  Topic Date Due  . Hepatitis C Screening  Sep 05, 1960  . TETANUS/TDAP  10/29/1977  . COLONOSCOPY  10/29/2008    -  reports that he quit smoking about 8 months ago. His smoking use included cigarettes. He has a 15.00 pack-year smoking history. He has never used smokeless tobacco. - Review of Systems: Per HPI. - Past Medical History: Patient Active Problem List   Diagnosis Date Noted  . History of tobacco abuse 10/23/2017  . Hyperkalemia 09/18/2017  . Systolic heart failure (HCC) 07/08/2017  . Acute congestive heart failure (HCC)   . Dyspnea 03/23/2017  . Cellulitis of right ankle 01/23/2017  . Muscle spasm 10/23/2016  . Insomnia 04/11/2016  . Orthopnea 03/31/2016  . 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 HTN (hypertension) 06/15/2014  . HLD (hyperlipidemia) 04/05/2014  . PVD (peripheral vascular disease) with claudication (HCC) 05/09/2013  . GERD (gastroesophageal reflux disease) 03/28/2013  . AAA (abdominal aortic aneurysm) without rupture (HCC) 10/28/2012  . Anxiety state 02/12/2012  . Occlusion and stenosis of carotid artery without  mention of cerebral infarction 01/19/2012  . Pulmonary nodule 05/19/2011  . CAD, ARTERY BYPASS GRAFT 10/19/2008  . DISC DISEASE, LUMBAR 10/19/2008   - Medications: reviewed and updated Current Outpatient Medications  Medication Sig Dispense Refill  . aspirin EC 81 MG tablet Take 1 tablet (81 mg total) by mouth daily. 30 tablet 2  . atorvastatin (LIPITOR) 80 MG tablet Take 1 tablet (80 mg total) by mouth daily. 90 tablet 2  . carvedilol (COREG) 12.5 MG tablet Take 1 tablet (12.5 mg total) by mouth 2 (two) times daily with a meal. 180 tablet 0  . citalopram (CELEXA) 20 MG tablet Take 1 tablet (20 mg total) by mouth daily. 30 tablet 0  . clopidogrel (PLAVIX) 75 MG tablet Take 1 tablet (75 mg total) by mouth daily with breakfast. 90 tablet 4  . diphenhydramine-acetaminophen (TYLENOL PM) 25-500 MG TABS tablet Take 2 tablets by mouth at bedtime as needed.    . ezetimibe (ZETIA) 10 MG tablet Take 1 tablet (10 mg total) by mouth daily. 90 tablet 3  . furosemide (LASIX) 20 MG tablet Take 1 tablet (20 mg total) by mouth daily. 30 tablet 3  . lisinopril (PRINIVIL,ZESTRIL) 2.5 MG tablet Take 1 tablet (2.5 mg total) by mouth daily. 30 tablet 0  . Phenylephrine-DM-GG (MUCINEX CONGEST & COUGH CHILD) 2.5-5-100 MG/5ML LIQD Take 10 mLs by mouth every 4 (four) hours as needed (for cough).    . ranitidine (ZANTAC) 300 MG tablet Take 1 tablet (300 mg total) by mouth daily. 60 tablet 0   No  current facility-administered medications for this visit.     Review of Systems See HPI     Objective:   Physical Exam BP 130/76   Pulse 70   Temp 97.9 F (36.6 C) (Oral)   Ht 5\' 11"  (1.803 m)   Wt 187 lb (84.8 kg)   SpO2 97%   BMI 26.08 kg/m  Gen: NAD, alert, cooperative with exam, well-appearing  CV: RRR, good S1/S2, no murmur, no edema, capillary refill brisk, no JVD Resp: CTABL, no wheezes, non-labored Abd: SNTND, BS present, no guarding or organomegaly Skin: no rashes, normal turgor  Neuro: no gross  deficits.  Psych: good insight, alert and oriented     Assessment & Plan:   Systolic heart failure (HCC) Patient appears euvolemic on exam. Weight stable. Was previously referred to heart failure clinic however referral went through work que and we actually need to send a staff message. Staff message sent to [facility] today with patient's information so we can set him up with heart failure. Even though he is not agreeable to cardiac cath, it would likely benefit him to have CHF doctor following to help with medication optimization given poor EF at 30-35% on least check. Patient is agreeable to this plan. - continue current regimen with daily lasix 20 mg  - referral to HF as noted above - follow up in 3 months or sooner if develops symptoms of hypervolemia  History of tobacco abuse Patient reports that he quit smoking and his breathing is significantly better now. Congratulated him and encouraged continued abstinence from tobacco.  Howard Pouch, MD,MS,  PGY2 10/23/2017 4:02 PM

## 2017-10-23 NOTE — Patient Instructions (Signed)
It was a pleasure seeing you today in our clinic. Here is the treatment plan we have discussed and agreed upon together:  Continue your current medication regimen. I sent generic ranitidine to your pharmacy.  A consult was placed to heart failure at today's visit.  You will receive a call to schedule an appointment. If you do not receive a call within two weeks please call our office so we can place the consult again.  Our clinic's number is [phone]. Please call with questions or concerns about what we discussed today.  Be well, Dr. Mosetta Putt

## 2017-10-23 NOTE — Assessment & Plan Note (Signed)
Patient appears euvolemic on exam. Weight stable. Was previously referred to heart failure clinic however referral went through work que and we actually need to send a staff message. Staff message sent to [facility] today with patient's information so we can set him up with heart failure. Even though he is not agreeable to cardiac cath, it would likely benefit him to have CHF doctor following to help with medication optimization given poor EF at 30-35% on least check. Patient is agreeable to this plan. - continue current regimen with daily lasix 20 mg  - referral to HF as noted above - follow up in 3 months or sooner if develops symptoms of hypervolemia

## 2017-10-23 NOTE — Assessment & Plan Note (Addendum)
Patient reports that he quit smoking and his breathing is significantly better now. Congratulated him and encouraged continued abstinence from tobacco.

## 2017-10-26 ENCOUNTER — Telehealth (HOSPITAL_COMMUNITY): Payer: Self-pay | Admitting: Internal Medicine

## 2017-10-26 NOTE — Telephone Encounter (Signed)
Left message for patient to call back.  Need to give pt New CHF appt info with Dr. [name].

## 2017-12-21 ENCOUNTER — Telehealth: Payer: Self-pay | Admitting: Student in an Organized Health Care Education/Training Program

## 2017-12-21 DIAGNOSIS — I5020 Unspecified systolic (congestive) heart failure: Secondary | ICD-10-CM

## 2017-12-21 NOTE — Telephone Encounter (Signed)
Needs refill on citalopram and lisinopril.  [facility] on [address]

## 2017-12-22 ENCOUNTER — Other Ambulatory Visit: Payer: Self-pay | Admitting: Family Medicine

## 2017-12-22 DIAGNOSIS — I5020 Unspecified systolic (congestive) heart failure: Secondary | ICD-10-CM

## 2017-12-22 MED ORDER — LISINOPRIL 2.5 MG PO TABS: 2.5000 mg | ORAL_TABLET | Freq: Every day | ORAL | 0 refills | Status: DC

## 2017-12-22 MED ORDER — CITALOPRAM HYDROBROMIDE 20 MG PO TABS: 20.0000 mg | ORAL_TABLET | Freq: Every day | ORAL | 0 refills | Status: DC

## 2017-12-22 NOTE — Telephone Encounter (Signed)
Refilled these meds for 1 month. Sent to [facility] on [address] as this is the pharmacy in his chart. Thanks.  Dolores Patty, DO PGY-2, [facility] 12/22/2017 8:34 AM

## 2017-12-24 ENCOUNTER — Ambulatory Visit (HOSPITAL_COMMUNITY)
Admission: RE | Admit: 2017-12-24 | Discharge: 2017-12-24 | Disposition: A | Discharge status: Home / Self Care | Payer: Medicaid Other | Source: Ambulatory Visit | Attending: Internal Medicine | Admitting: Internal Medicine

## 2017-12-24 ENCOUNTER — Encounter (HOSPITAL_COMMUNITY): Payer: Self-pay | Admitting: *Deleted

## 2017-12-24 ENCOUNTER — Other Ambulatory Visit (HOSPITAL_COMMUNITY): Payer: Self-pay | Admitting: *Deleted

## 2017-12-24 ENCOUNTER — Telehealth (HOSPITAL_COMMUNITY): Payer: Self-pay | Admitting: *Deleted

## 2017-12-24 VITALS — BP 173/112 | HR 91 | Wt 187.4 lb

## 2017-12-24 DIAGNOSIS — Z8673 Personal history of transient ischemic attack (TIA), and cerebral infarction without residual deficits: Secondary | ICD-10-CM | POA: Insufficient documentation

## 2017-12-24 DIAGNOSIS — I2510 Atherosclerotic heart disease of native coronary artery without angina pectoris: Secondary | ICD-10-CM | POA: Insufficient documentation

## 2017-12-24 DIAGNOSIS — I714 Abdominal aortic aneurysm, without rupture: Secondary | ICD-10-CM | POA: Insufficient documentation

## 2017-12-24 DIAGNOSIS — I6529 Occlusion and stenosis of unspecified carotid artery: Secondary | ICD-10-CM | POA: Insufficient documentation

## 2017-12-24 DIAGNOSIS — Z8249 Family history of ischemic heart disease and other diseases of the circulatory system: Secondary | ICD-10-CM | POA: Insufficient documentation

## 2017-12-24 DIAGNOSIS — Z720 Tobacco use: Secondary | ICD-10-CM

## 2017-12-24 DIAGNOSIS — K219 Gastro-esophageal reflux disease without esophagitis: Secondary | ICD-10-CM | POA: Insufficient documentation

## 2017-12-24 DIAGNOSIS — E785 Hyperlipidemia, unspecified: Secondary | ICD-10-CM | POA: Diagnosis not present

## 2017-12-24 DIAGNOSIS — I130 Hypertensive heart and chronic kidney disease with heart failure and stage 1 through stage 4 chronic kidney disease, or unspecified chronic kidney disease: Secondary | ICD-10-CM | POA: Diagnosis not present

## 2017-12-24 DIAGNOSIS — Z801 Family history of malignant neoplasm of trachea, bronchus and lung: Secondary | ICD-10-CM | POA: Diagnosis not present

## 2017-12-24 DIAGNOSIS — F419 Anxiety disorder, unspecified: Secondary | ICD-10-CM | POA: Diagnosis not present

## 2017-12-24 DIAGNOSIS — I252 Old myocardial infarction: Secondary | ICD-10-CM | POA: Diagnosis not present

## 2017-12-24 DIAGNOSIS — Z7902 Long term (current) use of antithrombotics/antiplatelets: Secondary | ICD-10-CM | POA: Insufficient documentation

## 2017-12-24 DIAGNOSIS — Z833 Family history of diabetes mellitus: Secondary | ICD-10-CM | POA: Insufficient documentation

## 2017-12-24 DIAGNOSIS — Z87891 Personal history of nicotine dependence: Secondary | ICD-10-CM | POA: Insufficient documentation

## 2017-12-24 DIAGNOSIS — Z823 Family history of stroke: Secondary | ICD-10-CM | POA: Diagnosis not present

## 2017-12-24 DIAGNOSIS — N189 Chronic kidney disease, unspecified: Secondary | ICD-10-CM | POA: Insufficient documentation

## 2017-12-24 DIAGNOSIS — I10 Essential (primary) hypertension: Secondary | ICD-10-CM | POA: Diagnosis not present

## 2017-12-24 DIAGNOSIS — I5022 Chronic systolic (congestive) heart failure: Secondary | ICD-10-CM

## 2017-12-24 DIAGNOSIS — Z79899 Other long term (current) drug therapy: Secondary | ICD-10-CM | POA: Diagnosis not present

## 2017-12-24 DIAGNOSIS — Z825 Family history of asthma and other chronic lower respiratory diseases: Secondary | ICD-10-CM | POA: Insufficient documentation

## 2017-12-24 DIAGNOSIS — I5020 Unspecified systolic (congestive) heart failure: Secondary | ICD-10-CM

## 2017-12-24 DIAGNOSIS — F1010 Alcohol abuse, uncomplicated: Secondary | ICD-10-CM | POA: Insufficient documentation

## 2017-12-24 DIAGNOSIS — I739 Peripheral vascular disease, unspecified: Secondary | ICD-10-CM | POA: Diagnosis not present

## 2017-12-24 DIAGNOSIS — Z951 Presence of aortocoronary bypass graft: Secondary | ICD-10-CM | POA: Insufficient documentation

## 2017-12-24 DIAGNOSIS — Z7982 Long term (current) use of aspirin: Secondary | ICD-10-CM | POA: Insufficient documentation

## 2017-12-24 LAB — BASIC METABOLIC PANEL
Anion gap: 9 (ref 5–15)
BUN: 13 mg/dL (ref 6–20)
CALCIUM: 8.7 mg/dL — AB (ref 8.9–10.3)
CHLORIDE: 103 mmol/L (ref 98–111)
CO2: 29 mmol/L (ref 22–32)
CREATININE: 1.11 mg/dL (ref 0.61–1.24)
Glucose, Bld: 100 mg/dL — ABNORMAL HIGH (ref 70–99)
Potassium: 3.6 mmol/L (ref 3.5–5.1)
SODIUM: 141 mmol/L (ref 135–145)

## 2017-12-24 LAB — CBC
HCT: 44.5 % (ref 39.0–52.0)
Hemoglobin: 14.1 g/dL (ref 13.0–17.0)
MCH: 27.3 pg (ref 26.0–34.0)
MCHC: 31.7 g/dL (ref 30.0–36.0)
MCV: 86.2 fL (ref 78.0–100.0)
PLATELETS: 191 10*3/uL (ref 150–400)
RBC: 5.16 MIL/uL (ref 4.22–5.81)
RDW: 15.4 % (ref 11.5–15.5)
WBC: 6.8 10*3/uL (ref 4.0–10.5)

## 2017-12-24 LAB — PROTIME-INR
INR: 1.19
PROTHROMBIN TIME: 15.1 s (ref 11.4–15.2)

## 2017-12-24 MED ORDER — SACUBITRIL-VALSARTAN 49-51 MG PO TABS: 1.0000 | ORAL_TABLET | Freq: Two times a day (BID) | ORAL | 3 refills | Status: DC

## 2017-12-24 NOTE — Progress Notes (Signed)
Advanced Heart Failure Clinic Note   Referring Physician: Dr. Mosetta Putt PCP: Howard Pouch, MD PCP-Cardiologist: No primary care provider on file.   HPI: Randy [name] is a 57 y.o. male with history of combined HF (diagnosed 03/2017), HTN, HLD, CAD s/p CABG 2010 and PCI (last ~2014), former tobacco use, CVA, and AAA.  Admitted 03/2017 with CHF exacerbation. Echo showed EF 30-35% with grade 2 DD. Cardiology was consulted and he refused [facility] for further work up.   He presents today to establish care. Overall doing well. He denies CP. He gets SOB with exertion while outside, but it improves once he gets inside. Can walk about 50 feet before he gets SOB. SOB has been going on for several years. Doesn't feel like this has progressed.  He snores, but has never had a sleep study. Chronic orthopnea. Denies PND. No wheezing.  He has occasional BLE edema. Appetite and energy is great. Goes fishing frequently. No cough, fever, or chills. Limits salt intake. Drinks 8-10 bottles of water/day. Takes medication as ordered.   Quit smoking Jan 1. Smoked for ~50 years prior 1 ppd. ETOH use (Bud Light) 18 pack/week, decreased from 18-24 beers daily. Lives alone. Would not want another CABG.    Review of Systems: [y] = yes, [ ]  = no   General: Weight gain [ ] ; Weight loss [ ] ; Anorexia [ ] ; Fatigue [ ] ; Fever [ ] ; Chills [ ] ; Weakness [ ]   Cardiac: Chest pain/pressure [ ] ; Resting SOB [ ] ; Exertional SOB Cove.Etienne ]; Orthopnea Cove.Etienne ]; Pedal Edema Cove.Etienne ]; Palpitations [ ] ; Syncope [ ] ; Presyncope [ ] ; Paroxysmal nocturnal dyspnea[ ]   Pulmonary: Cough [ ] ; Wheezing[ ] ; Hemoptysis[ ] ; Sputum [ ] ; Snoring [ ]   GI: Vomiting[ ] ; Dysphagia[ ] ; Melena[ ] ; Hematochezia [ ] ; Heartburn[ ] ; Abdominal pain [ ] ; Constipation [ ] ; Diarrhea [ ] ; BRBPR [ ]   GU: Hematuria[ ] ; Dysuria [ ] ; Nocturia[ ]   Vascular: Pain in legs with walking [ ] ; Pain in feet with lying flat [ ] ; Non-healing sores [ ] ; Stroke [ ] ; TIA [ ] ; Slurred speech [ ] ;    Neuro: Headaches[ ] ; Vertigo[ ] ; Seizures[ ] ; Paresthesias[ ] ;Blurred vision [ ] ; Diplopia [ ] ; Vision changes [ ]   Ortho/Skin: Arthritis y ]; Joint pain [ ] ; Muscle pain [ ] ; Joint swelling [ ] ; Back Pain Cove.Etienne ]; Rash [ ]   Psych: Depression[ ] ; Anxiety[ ]   Heme: Bleeding problems [ ] ; Clotting disorders [ ] ; Anemia [ ]   Endocrine: Diabetes [ ] ; Thyroid dysfunction[ ]    Past Medical History:  Diagnosis Date  . AAA (abdominal aortic aneurysm) [facility]) July 2013  . Abnormal heart rhythm   . Anxiety   . CAD (coronary artery disease)   . Carotid artery occlusion   . CHF (congestive heart failure) (HCC)   . GERD (gastroesophageal reflux disease)   . Heart attack (HCC) 2010  . Hx of CABG 2010  . Hyperlipidemia   . Hypertension   . Irregular heartbeat   . Lung nodule   . Polysubstance abuse (HCC)   . PVD (peripheral vascular disease) (HCC)   . Shortness of breath    when lying flat on back  . Stroke (HCC)   . Uncontrolled hypertension 02/02/2012    Current Outpatient Medications  Medication Sig Dispense Refill  . aspirin EC 81 MG tablet Take 1 tablet (81 mg total) by mouth daily. 30 tablet 2  . atorvastatin (LIPITOR) 80 MG tablet Take 1 tablet (80 mg  total) by mouth daily. 90 tablet 2  . carvedilol (COREG) 12.5 MG tablet Take 1 tablet (12.5 mg total) by mouth 2 (two) times daily with a meal. 180 tablet 0  . citalopram (CELEXA) 20 MG tablet Take 1 tablet (20 mg total) by mouth daily. 30 tablet 0  . clopidogrel (PLAVIX) 75 MG tablet Take 1 tablet (75 mg total) by mouth daily with breakfast. 90 tablet 4  . diphenhydramine-acetaminophen (TYLENOL PM) 25-500 MG TABS tablet Take 2 tablets by mouth at bedtime as needed.    . ezetimibe (ZETIA) 10 MG tablet Take 1 tablet (10 mg total) by mouth daily. 90 tablet 3  . furosemide (LASIX) 20 MG tablet Take 1 tablet (20 mg total) by mouth daily. 30 tablet 3  . lisinopril (PRINIVIL,ZESTRIL) 2.5 MG tablet Take 1 tablet (2.5 mg total) by mouth daily. 30  tablet 0  . Phenylephrine-DM-GG (MUCINEX CONGEST & COUGH CHILD) 2.5-5-100 MG/5ML LIQD Take 10 mLs by mouth every 4 (four) hours as needed (for cough).    . ranitidine (ZANTAC) 300 MG tablet Take 1 tablet (300 mg total) by mouth daily. 60 tablet 0   No current facility-administered medications for this encounter.     No Known Allergies    Social History   Socioeconomic History  . Marital status: Widowed    Spouse name: Not on file  . Number of children: 1  . Years of education: 9th  . Highest education level: Not on file  Occupational History  . Occupation: not working.   Social Needs  . Financial resource strain: Not on file  . Food insecurity:    Worry: Not on file    Inability: Not on file  . Transportation needs:    Medical: Not on file    Non-medical: Not on file  Tobacco Use  . Smoking status: Former Smoker    Packs/day: 0.50    Years: 30.00    Pack years: 15.00    Types: Cigarettes    Last attempt to quit: 01/28/2017    Years since quitting: 0.9  . Smokeless tobacco: Never Used  Substance and Sexual Activity  . Alcohol use: Yes    Alcohol/week: 3.0 oz    Types: 5 Cans of beer per week    Comment: occasionally  . Drug use: Yes    Types: Marijuana  . Sexual activity: Yes  Lifestyle  . Physical activity:    Days per week: Not on file    Minutes per session: Not on file  . Stress: Not on file  Relationships  . Social connections:    Talks on phone: Not on file    Gets together: Not on file    Attends religious service: Not on file    Active member of club or organization: Not on file    Attends meetings of clubs or organizations: Not on file    Relationship status: Not on file  . Intimate partner violence:    Fear of current or ex partner: Not on file    Emotionally abused: Not on file    Physically abused: Not on file    Forced sexual activity: Not on file  Other Topics Concern  . Not on file  Social History Narrative   Pt is widowed and lives alone.    Patient is right handed   Patient drinks about 2cups of caffeine daily.         Family History  Problem Relation Age of Onset  . Asthma Mother   .  Diabetes Mother   . Hyperlipidemia Mother   . Hypertension Mother   . Lung cancer Father   . Cancer Father   . Stroke Sister     Vitals:   12/24/17 1134  BP: (!) 173/112  Pulse: 91  SpO2: 99%  Weight: 187 lb 6.4 oz (85 kg)     PHYSICAL EXAM: General:  Well appearing. No respiratory difficulty HEENT: normal Neck: supple. JVP 7-8. Carotids 2+ bilat; no bruits. No lymphadenopathy or thyromegaly appreciated. Cor: PMI nondisplaced. Regular rate & rhythm. No rubs, gallops or murmurs. Lungs: clear Abdomen: soft, nontender, nondistended. No hepatosplenomegaly. No bruits or masses. Good bowel sounds. Extremities: no cyanosis, clubbing, rash, BLE +1 ankle edema Neuro: alert & oriented x 3, cranial nerves grossly intact. moves all 4 extremities w/o difficulty. Affect pleasant.  ECG: NSR 75 LVH IVCD Personally reviewed   ASSESSMENT & PLAN:  1. Chronic systolic HF Echo in 2017 showed EF 55% - Echo 03/2017: EF 30-35% with grade 2 DD.  - NYHA II-III - Volume status okay - Continue lasix 20 mg daily - Continue coreg 12.5 mg BID - Stop lisinopril 2.5 mg daily. Start Entresto 49/51 mg BID with 36 hour wash out.  - No spiro with recent hyperkalemia - Schedule R/LHC next week to further assess reduction in EF  - Repeat echo.   2. CAD s/p CABG 2010 - On ASA, plavix, statin, BB - No s/s ischemia - LHC as above.   3. Hx of Tobacco use - Quit smoking January this year. Congratulated on cessation.   4. HTN - Elevated 170s today  - Start entresto as above.   5. CKD - Baseline ~1.2-1.4 - BMET today  6. ?OSA - Discussed possible sleep study referral for next visit  7. Alcohol abuse - Drinks 5-6 beers daily. Encouraged him to cut back.   Follow up with pharmacy every 2 weeks for 3 visits Follow up with Dr [name] in  2-3 months  Alford Highland, NP 12/24/17   Patient seen and examined with the above-signed Advanced Practice Provider and/or Housestaff. I personally reviewed laboratory data, imaging studies and relevant notes. I independently examined the patient and formulated the important aspects of the plan. I have edited the note to reflect any of my changes or salient points. I have personally discussed the plan with the patient and/or family.  57 y/o male as above with CAD s/p CABG 2010, poorly controlled HTN, ETOH/tobacco abuse referred by Dr. Mosetta Putt for further evaluation of drop in EF. EF previous 55% now 35%.   Currently NYHA II-III but no acute change in symptoms. Denies angina. Continues to smoke and drink heavily. B very high.   On exam General:  Well appearing. No resp difficulty HEENT: normal Neck: supple. no JVD. Carotids 2+ bilat; no bruits. No lymphadenopathy or thryomegaly appreciated. Cor: PMI nondisplaced. Regular rate & rhythm. No rubs, gallops or murmurs. Lungs: clear with reduced BS throughout. No wheeze Abdomen: soft, nontender, nondistended. No hepatosplenomegaly. No bruits or masses. Good bowel sounds. Extremities: no cyanosis, clubbing, rash, edema Neuro: alert & orientedx3, cranial nerves grossly intact. moves all 4 extremities w/o difficulty. Affect pleasant   Echos reviewed personally and EF has dropped. I discussed this with him and said he has at least 4 major RFs for dropping EF 1) Progressive CAD 2) Poorly controlled HTN 3) Heavy ETOH abuse 4) Possible OSA  Will schedule R/L cath. Check repeat echo. Switch lisinopril to Entresto to help with HTN. Encouraged him  to cut back ETOH and tobacco. Consider sleep study and possible cMRI if remainder of w/u is negative.   Arvilla Meres, MD  2:35 PM

## 2017-12-24 NOTE — Telephone Encounter (Signed)
PA approved through [facility] Tr[facility] from 12/24/17 through 12/19/18. PA# [id number].

## 2017-12-24 NOTE — Patient Instructions (Signed)
Labs today (will call for abnormal results, otherwise no news is good news)  STOP taking Lisinopril   START taking Entresto 49-51 mg (1 tablet) Twice daily  You have been scheduled for a Right and Left heart Cath, please see attached instruction sheet.  Echocardiogram has been ordered for you.  We'll schedule you to follow up in our Pharmacy clinic for medication adjustment every 2 weeks X's 3.    Follow up in 2-3 Months.

## 2017-12-24 NOTE — Telephone Encounter (Signed)
Entresto PA submitted through [employer] today.  Conf# [id number].  Will wait for approval.

## 2017-12-24 NOTE — H&P (View-Only) (Signed)
Advanced Heart Failure Clinic Note   Referring Physician: Dr. Feng PCP: Feng, Lauren, MD PCP-Cardiologist: No primary care provider on file.   HPI: Randy [name] is a 57 y.o. male with history of combined HF (diagnosed 03/2017), HTN, HLD, CAD s/p CABG 2010 and PCI (last ~2014), former tobacco use, CVA, and AAA.  Admitted 03/2017 with CHF exacerbation. Echo showed EF 30-35% with grade 2 DD. Cardiology was consulted and he refused R/LHC for further work up.   He presents today to establish care. Overall doing well. He denies CP. He gets SOB with exertion while outside, but it improves once he gets inside. Can walk about 50 feet before he gets SOB. SOB has been going on for several years. Doesn't feel like this has progressed.  He snores, but has never had a sleep study. Chronic orthopnea. Denies PND. No wheezing.  He has occasional BLE edema. Appetite and energy is great. Goes fishing frequently. No cough, fever, or chills. Limits salt intake. Drinks 8-10 bottles of water/day. Takes medication as ordered.   Quit smoking Jan 1. Smoked for ~50 years prior 1 ppd. ETOH use (Bud Light) 18 pack/week, decreased from 18-24 beers daily. Lives alone. Would not want another CABG.    Review of Systems: [y] = yes, [ ] = no   General: Weight gain [ ]; Weight loss [ ]; Anorexia [ ]; Fatigue [ ]; Fever [ ]; Chills [ ]; Weakness [ ]  Cardiac: Chest pain/pressure [ ]; Resting SOB [ ]; Exertional SOB [y ]; Orthopnea [y ]; Pedal Edema [y ]; Palpitations [ ]; Syncope [ ]; Presyncope [ ]; Paroxysmal nocturnal dyspnea[ ]  Pulmonary: Cough [ ]; Wheezing[ ]; Hemoptysis[ ]; Sputum [ ]; Snoring [ ]  GI: Vomiting[ ]; Dysphagia[ ]; Melena[ ]; Hematochezia [ ]; Heartburn[ ]; Abdominal pain [ ]; Constipation [ ]; Diarrhea [ ]; BRBPR [ ]  GU: Hematuria[ ]; Dysuria [ ]; Nocturia[ ]  Vascular: Pain in legs with walking [ ]; Pain in feet with lying flat [ ]; Non-healing sores [ ]; Stroke [ ]; TIA [ ]; Slurred speech [ ];    Neuro: Headaches[ ]; Vertigo[ ]; Seizures[ ]; Paresthesias[ ];Blurred vision [ ]; Diplopia [ ]; Vision changes [ ]  Ortho/Skin: Arthritis y ]; Joint pain [ ]; Muscle pain [ ]; Joint swelling [ ]; Back Pain [y ]; Rash [ ]  Psych: Depression[ ]; Anxiety[ ]  Heme: Bleeding problems [ ]; Clotting disorders [ ]; Anemia [ ]  Endocrine: Diabetes [ ]; Thyroid dysfunction[ ]   Past Medical History:  Diagnosis Date  . AAA (abdominal aortic aneurysm) (HCC) July 2013  . Abnormal heart rhythm   . Anxiety   . CAD (coronary artery disease)   . Carotid artery occlusion   . CHF (congestive heart failure) (HCC)   . GERD (gastroesophageal reflux disease)   . Heart attack (HCC) 2010  . Hx of CABG 2010  . Hyperlipidemia   . Hypertension   . Irregular heartbeat   . Lung nodule   . Polysubstance abuse (HCC)   . PVD (peripheral vascular disease) (HCC)   . Shortness of breath    when lying flat on back  . Stroke (HCC)   . Uncontrolled hypertension 02/02/2012    Current Outpatient Medications  Medication Sig Dispense Refill  . aspirin EC 81 MG tablet Take 1 tablet (81 mg total) by mouth daily. 30 tablet 2  . atorvastatin (LIPITOR) 80 MG tablet Take 1 tablet (80 mg   total) by mouth daily. 90 tablet 2  . carvedilol (COREG) 12.5 MG tablet Take 1 tablet (12.5 mg total) by mouth 2 (two) times daily with a meal. 180 tablet 0  . citalopram (CELEXA) 20 MG tablet Take 1 tablet (20 mg total) by mouth daily. 30 tablet 0  . clopidogrel (PLAVIX) 75 MG tablet Take 1 tablet (75 mg total) by mouth daily with breakfast. 90 tablet 4  . diphenhydramine-acetaminophen (TYLENOL PM) 25-500 MG TABS tablet Take 2 tablets by mouth at bedtime as needed.    . ezetimibe (ZETIA) 10 MG tablet Take 1 tablet (10 mg total) by mouth daily. 90 tablet 3  . furosemide (LASIX) 20 MG tablet Take 1 tablet (20 mg total) by mouth daily. 30 tablet 3  . lisinopril (PRINIVIL,ZESTRIL) 2.5 MG tablet Take 1 tablet (2.5 mg total) by mouth daily. 30  tablet 0  . Phenylephrine-DM-GG (MUCINEX CONGEST & COUGH CHILD) 2.5-5-100 MG/5ML LIQD Take 10 mLs by mouth every 4 (four) hours as needed (for cough).    . ranitidine (ZANTAC) 300 MG tablet Take 1 tablet (300 mg total) by mouth daily. 60 tablet 0   No current facility-administered medications for this encounter.     No Known Allergies    Social History   Socioeconomic History  . Marital status: Widowed    Spouse name: Not on file  . Number of children: 1  . Years of education: 9th  . Highest education level: Not on file  Occupational History  . Occupation: not working.   Social Needs  . Financial resource strain: Not on file  . Food insecurity:    Worry: Not on file    Inability: Not on file  . Transportation needs:    Medical: Not on file    Non-medical: Not on file  Tobacco Use  . Smoking status: Former Smoker    Packs/day: 0.50    Years: 30.00    Pack years: 15.00    Types: Cigarettes    Last attempt to quit: 01/28/2017    Years since quitting: 0.9  . Smokeless tobacco: Never Used  Substance and Sexual Activity  . Alcohol use: Yes    Alcohol/week: 3.0 oz    Types: 5 Cans of beer per week    Comment: occasionally  . Drug use: Yes    Types: Marijuana  . Sexual activity: Yes  Lifestyle  . Physical activity:    Days per week: Not on file    Minutes per session: Not on file  . Stress: Not on file  Relationships  . Social connections:    Talks on phone: Not on file    Gets together: Not on file    Attends religious service: Not on file    Active member of club or organization: Not on file    Attends meetings of clubs or organizations: Not on file    Relationship status: Not on file  . Intimate partner violence:    Fear of current or ex partner: Not on file    Emotionally abused: Not on file    Physically abused: Not on file    Forced sexual activity: Not on file  Other Topics Concern  . Not on file  Social History Narrative   Pt is widowed and lives alone.    Patient is right handed   Patient drinks about 2cups of caffeine daily.         Family History  Problem Relation Age of Onset  . Asthma Mother   .   Diabetes Mother   . Hyperlipidemia Mother   . Hypertension Mother   . Lung cancer Father   . Cancer Father   . Stroke Sister     Vitals:   12/24/17 1134  BP: (!) 173/112  Pulse: 91  SpO2: 99%  Weight: 187 lb 6.4 oz (85 kg)     PHYSICAL EXAM: General:  Well appearing. No respiratory difficulty HEENT: normal Neck: supple. JVP 7-8. Carotids 2+ bilat; no bruits. No lymphadenopathy or thyromegaly appreciated. Cor: PMI nondisplaced. Regular rate & rhythm. No rubs, gallops or murmurs. Lungs: clear Abdomen: soft, nontender, nondistended. No hepatosplenomegaly. No bruits or masses. Good bowel sounds. Extremities: no cyanosis, clubbing, rash, BLE +1 ankle edema Neuro: alert & oriented x 3, cranial nerves grossly intact. moves all 4 extremities w/o difficulty. Affect pleasant.  ECG: NSR 75 LVH IVCD 106ms Personally reviewed   ASSESSMENT & PLAN:  1. Chronic systolic HF Echo in 2017 showed EF 55% - Echo 03/2017: EF 30-35% with grade 2 DD.  - NYHA II-III - Volume status okay - Continue lasix 20 mg daily - Continue coreg 12.5 mg BID - Stop lisinopril 2.5 mg daily. Start Entresto 49/51 mg BID with 36 hour wash out.  - No spiro with recent hyperkalemia - Schedule R/LHC next week to further assess reduction in EF  - Repeat echo.   2. CAD s/p CABG 2010 - On ASA, plavix, statin, BB - No s/s ischemia - LHC as above.   3. Hx of Tobacco use - Quit smoking January this year. Congratulated on cessation.   4. HTN - Elevated 170s today  - Start entresto as above.   5. CKD - Baseline ~1.2-1.4 - BMET today  6. ?OSA - Discussed possible sleep study referral for next visit  7. Alcohol abuse - Drinks 5-6 beers daily. Encouraged him to cut back.   Follow up with pharmacy every 2 weeks for 3 visits Follow up with Dr Briyonna Omara in  2-3 months  Ashley M Smith, NP 12/24/17   Patient seen and examined with the above-signed Advanced Practice Provider and/or Housestaff. I personally reviewed laboratory data, imaging studies and relevant notes. I independently examined the patient and formulated the important aspects of the plan. I have edited the note to reflect any of my changes or salient points. I have personally discussed the plan with the patient and/or family.  57 y/o male as above with CAD s/p CABG 2010, poorly controlled HTN, ETOH/tobacco abuse referred by Dr. Feng for further evaluation of drop in EF. EF previous 55% now 35%.   Currently NYHA II-III but no acute change in symptoms. Denies angina. Continues to smoke and drink heavily. B very high.   On exam General:  Well appearing. No resp difficulty HEENT: normal Neck: supple. no JVD. Carotids 2+ bilat; no bruits. No lymphadenopathy or thryomegaly appreciated. Cor: PMI nondisplaced. Regular rate & rhythm. No rubs, gallops or murmurs. Lungs: clear with reduced BS throughout. No wheeze Abdomen: soft, nontender, nondistended. No hepatosplenomegaly. No bruits or masses. Good bowel sounds. Extremities: no cyanosis, clubbing, rash, edema Neuro: alert & orientedx3, cranial nerves grossly intact. moves all 4 extremities w/o difficulty. Affect pleasant   Echos reviewed personally and EF has dropped. I discussed this with him and said he has at least 4 major RFs for dropping EF 1) Progressive CAD 2) Poorly controlled HTN 3) Heavy ETOH abuse 4) Possible OSA  Will schedule R/L cath. Check repeat echo. Switch lisinopril to Entresto to help with HTN. Encouraged him   to cut back ETOH and tobacco. Consider sleep study and possible cMRI if remainder of w/u is negative.   Demetrious Rainford, MD  2:35 PM   

## 2017-12-29 ENCOUNTER — Ambulatory Visit (HOSPITAL_COMMUNITY)
Admission: RE | Admit: 2017-12-29 | Discharge: 2017-12-29 | Disposition: A | Discharge status: Home / Self Care | Payer: Medicaid Other | Source: Ambulatory Visit | Attending: Internal Medicine | Admitting: Internal Medicine

## 2017-12-29 ENCOUNTER — Encounter (HOSPITAL_COMMUNITY)
Admission: RE | Disposition: A | Discharge status: Home / Self Care | Payer: Self-pay | Source: Ambulatory Visit | Attending: Internal Medicine

## 2017-12-29 DIAGNOSIS — F1010 Alcohol abuse, uncomplicated: Secondary | ICD-10-CM | POA: Insufficient documentation

## 2017-12-29 DIAGNOSIS — I2582 Chronic total occlusion of coronary artery: Secondary | ICD-10-CM | POA: Insufficient documentation

## 2017-12-29 DIAGNOSIS — Z8673 Personal history of transient ischemic attack (TIA), and cerebral infarction without residual deficits: Secondary | ICD-10-CM | POA: Insufficient documentation

## 2017-12-29 DIAGNOSIS — I252 Old myocardial infarction: Secondary | ICD-10-CM | POA: Diagnosis not present

## 2017-12-29 DIAGNOSIS — Z7982 Long term (current) use of aspirin: Secondary | ICD-10-CM | POA: Insufficient documentation

## 2017-12-29 DIAGNOSIS — E785 Hyperlipidemia, unspecified: Secondary | ICD-10-CM | POA: Diagnosis not present

## 2017-12-29 DIAGNOSIS — I130 Hypertensive heart and chronic kidney disease with heart failure and stage 1 through stage 4 chronic kidney disease, or unspecified chronic kidney disease: Secondary | ICD-10-CM | POA: Diagnosis not present

## 2017-12-29 DIAGNOSIS — I5022 Chronic systolic (congestive) heart failure: Secondary | ICD-10-CM | POA: Diagnosis not present

## 2017-12-29 DIAGNOSIS — I739 Peripheral vascular disease, unspecified: Secondary | ICD-10-CM | POA: Insufficient documentation

## 2017-12-29 DIAGNOSIS — Z87891 Personal history of nicotine dependence: Secondary | ICD-10-CM | POA: Diagnosis not present

## 2017-12-29 DIAGNOSIS — I714 Abdominal aortic aneurysm, without rupture: Secondary | ICD-10-CM | POA: Diagnosis not present

## 2017-12-29 DIAGNOSIS — K219 Gastro-esophageal reflux disease without esophagitis: Secondary | ICD-10-CM | POA: Insufficient documentation

## 2017-12-29 DIAGNOSIS — N189 Chronic kidney disease, unspecified: Secondary | ICD-10-CM | POA: Insufficient documentation

## 2017-12-29 DIAGNOSIS — I6529 Occlusion and stenosis of unspecified carotid artery: Secondary | ICD-10-CM | POA: Insufficient documentation

## 2017-12-29 DIAGNOSIS — I25810 Atherosclerosis of coronary artery bypass graft(s) without angina pectoris: Secondary | ICD-10-CM | POA: Insufficient documentation

## 2017-12-29 DIAGNOSIS — Z7902 Long term (current) use of antithrombotics/antiplatelets: Secondary | ICD-10-CM | POA: Insufficient documentation

## 2017-12-29 DIAGNOSIS — F419 Anxiety disorder, unspecified: Secondary | ICD-10-CM | POA: Insufficient documentation

## 2017-12-29 DIAGNOSIS — I2510 Atherosclerotic heart disease of native coronary artery without angina pectoris: Secondary | ICD-10-CM

## 2017-12-29 HISTORY — PX: RIGHT/LEFT HEART CATH AND CORONARY/GRAFT ANGIOGRAPHY: CATH118267

## 2017-12-29 LAB — POCT I-STAT 3, VENOUS BLOOD GAS (G3P V)
Acid-base deficit: 3 mmol/L — ABNORMAL HIGH (ref 0.0–2.0)
Acid-base deficit: 5 mmol/L — ABNORMAL HIGH (ref 0.0–2.0)
Bicarbonate: 19.8 mmol/L — ABNORMAL LOW (ref 20.0–28.0)
Bicarbonate: 22.5 mmol/L (ref 20.0–28.0)
O2 SAT: 66 %
O2 Saturation: 66 %
PCO2 VEN: 38.9 mmHg — AB (ref 44.0–60.0)
PH VEN: 7.37 (ref 7.250–7.430)
PO2 VEN: 35 mmHg (ref 32.0–45.0)
TCO2: 21 mmol/L — AB (ref 22–32)
TCO2: 24 mmol/L (ref 22–32)
pCO2, Ven: 33.9 mmHg — ABNORMAL LOW (ref 44.0–60.0)
pH, Ven: 7.374 (ref 7.250–7.430)
pO2, Ven: 35 mmHg (ref 32.0–45.0)

## 2017-12-29 LAB — POCT I-STAT 3, ART BLOOD GAS (G3+)
Acid-base deficit: 4 mmol/L — ABNORMAL HIGH (ref 0.0–2.0)
BICARBONATE: 19.6 mmol/L — AB (ref 20.0–28.0)
O2 Saturation: 94 %
PCO2 ART: 31.7 mmHg — AB (ref 32.0–48.0)
PH ART: 7.398 (ref 7.350–7.450)
PO2 ART: 70 mmHg — AB (ref 83.0–108.0)
TCO2: 21 mmol/L — AB (ref 22–32)

## 2017-12-29 SURGERY — RIGHT/LEFT HEART CATH AND CORONARY/GRAFT ANGIOGRAPHY
Anesthesia: LOCAL

## 2017-12-29 MED ORDER — SODIUM CHLORIDE 0.9 % IV SOLN: 250.0000 mL | INTRAVENOUS | Status: DC | PRN

## 2017-12-29 MED ORDER — MIDAZOLAM HCL 2 MG/2ML IJ SOLN
INTRAMUSCULAR | Status: DC | PRN
  Administered 2017-12-29 (×2): 1 mg via INTRAVENOUS

## 2017-12-29 MED ORDER — HEPARIN SODIUM (PORCINE) 1000 UNIT/ML IJ SOLN
INTRAMUSCULAR | Status: DC | PRN
  Administered 2017-12-29: 4000 [IU] via INTRAVENOUS

## 2017-12-29 MED ORDER — HEPARIN (PORCINE) IN NACL 2-0.9 UNITS/ML
INTRAMUSCULAR | Status: AC | PRN
  Administered 2017-12-29 (×2): 500 mL

## 2017-12-29 MED ORDER — ACETAMINOPHEN 325 MG PO TABS: 650.0000 mg | ORAL_TABLET | ORAL | Status: DC | PRN

## 2017-12-29 MED ORDER — FENTANYL CITRATE (PF) 100 MCG/2ML IJ SOLN
INTRAMUSCULAR | Status: AC
  Filled 2017-12-29: qty 2

## 2017-12-29 MED ORDER — LIDOCAINE HCL (PF) 1 % IJ SOLN
INTRAMUSCULAR | Status: DC | PRN
  Administered 2017-12-29 (×2): 2 mL

## 2017-12-29 MED ORDER — SODIUM CHLORIDE 0.9% FLUSH: 3.0000 mL | INTRAVENOUS | Status: DC | PRN

## 2017-12-29 MED ORDER — IOPAMIDOL (ISOVUE-370) INJECTION 76%
INTRAVENOUS | Status: DC | PRN
  Administered 2017-12-29: 100 mL via INTRA_ARTERIAL

## 2017-12-29 MED ORDER — HEPARIN SODIUM (PORCINE) 1000 UNIT/ML IJ SOLN
INTRAMUSCULAR | Status: AC
  Filled 2017-12-29: qty 1

## 2017-12-29 MED ORDER — SODIUM CHLORIDE 0.9% FLUSH: 3.0000 mL | Freq: Two times a day (BID) | INTRAVENOUS | Status: DC

## 2017-12-29 MED ORDER — HEPARIN (PORCINE) IN NACL 1000-0.9 UT/500ML-% IV SOLN
INTRAVENOUS | Status: AC
  Filled 2017-12-29: qty 1000

## 2017-12-29 MED ORDER — FENTANYL CITRATE (PF) 100 MCG/2ML IJ SOLN
INTRAMUSCULAR | Status: DC | PRN
  Administered 2017-12-29 (×2): 25 ug via INTRAVENOUS

## 2017-12-29 MED ORDER — MIDAZOLAM HCL 2 MG/2ML IJ SOLN
INTRAMUSCULAR | Status: AC
  Filled 2017-12-29: qty 2

## 2017-12-29 MED ORDER — ONDANSETRON HCL 4 MG/2ML IJ SOLN: 4.0000 mg | Freq: Four times a day (QID) | INTRAMUSCULAR | Status: DC | PRN

## 2017-12-29 MED ORDER — SODIUM CHLORIDE 0.9 % IV SOLN
INTRAVENOUS | Status: DC
  Administered 2017-12-29: 11:00:00 via INTRAVENOUS

## 2017-12-29 MED ORDER — IOPAMIDOL (ISOVUE-370) INJECTION 76%
INTRAVENOUS | Status: AC
  Filled 2017-12-29: qty 125

## 2017-12-29 MED ORDER — LIDOCAINE HCL (PF) 1 % IJ SOLN
INTRAMUSCULAR | Status: AC
  Filled 2017-12-29: qty 30

## 2017-12-29 MED ORDER — VERAPAMIL HCL 2.5 MG/ML IV SOLN
INTRAVENOUS | Status: DC | PRN
  Administered 2017-12-29: 10 mL via INTRA_ARTERIAL

## 2017-12-29 MED ORDER — ASPIRIN 81 MG PO CHEW: 81.0000 mg | CHEWABLE_TABLET | ORAL | Status: DC

## 2017-12-29 MED ORDER — VERAPAMIL HCL 2.5 MG/ML IV SOLN
INTRAVENOUS | Status: AC
  Filled 2017-12-29: qty 2

## 2017-12-29 MED ORDER — SODIUM CHLORIDE 0.9 % IV SOLN: INTRAVENOUS | Status: DC

## 2017-12-29 SURGICAL SUPPLY — 10 items
CATH BALLN WEDGE 5F 110CM (CATHETERS) ×1 IMPLANT
CATH INFINITI 5FR MULTPACK ANG (CATHETERS) ×1 IMPLANT
DEVICE RAD TR BAND REGULAR (VASCULAR PRODUCTS) ×1 IMPLANT
GLIDESHEATH SLEND SS 6F .021 (SHEATH) ×1 IMPLANT
GUIDEWIRE INQWIRE 1.5J.035X260 (WIRE) IMPLANT
INQWIRE 1.5J .035X260CM (WIRE) ×2
KIT HEART LEFT (KITS) ×2 IMPLANT
PACK CARDIAC CATHETERIZATION (CUSTOM PROCEDURE TRAY) ×2 IMPLANT
SHEATH GLIDE SLENDER 4/5FR (SHEATH) ×1 IMPLANT
TRANSDUCER W/STOPCOCK (MISCELLANEOUS) ×2 IMPLANT

## 2017-12-29 NOTE — Interval H&P Note (Signed)
History and Physical Interval Note:  12/29/2017 2:14 PM  Elvon Mcculloh  has presented today for surgery, with the diagnosis of chf  The various methods of treatment have been discussed with the patient and family. After consideration of risks, benefits and other options for treatment, the patient has consented to  Procedure(s): RIGHT/LEFT HEART CATH AND CORONARY/GRAFT ANGIOGRAPHY (N/A) and possible coronary angioplasty as a surgical intervention .  The patient's history has been reviewed, patient examined, no change in status, stable for surgery.  I have reviewed the patient's chart and labs.  Questions were answered to the patient's satisfaction.     Daniel Bensimhon

## 2017-12-29 NOTE — Discharge Instructions (Signed)
° °  DRINK PLENTY OF FLUIDS FOR THE NEXT 2-3 DAYS TO KEEP HYDRATED. ° °Radial Site Care °Refer to this sheet in the next few weeks. These instructions provide you with information about caring for yourself after your procedure. Your health care provider may also give you more specific instructions. Your treatment has been planned according to current medical practices, but problems sometimes occur. Call your health care provider if you have any problems or questions after your procedure. °What can I expect after the procedure? °After your procedure, it is typical to have the following: °· Bruising at the radial site that usually fades within 1-2 weeks. °· Blood collecting in the tissue (hematoma) that may be painful to the touch. It should usually decrease in size and tenderness within 1-2 weeks. ° °Follow these instructions at home: °· Take medicines only as directed by your health care provider. °· You may shower 24-48 hours after the procedure or as directed by your health care provider. Remove the bandage (dressing) and gently wash the site with plain soap and water. Pat the area dry with a clean towel. Do not rub the site, because this may cause bleeding. °· Do not take baths, swim, or use a hot tub until your health care provider approves. °· Check your insertion site every day for redness, swelling, or drainage. °· Do not apply powder or lotion to the site. °· Do not flex or bend the affected arm for 24 hours or as directed by your health care provider. °· Do not push or pull heavy objects with the affected arm for 24 hours or as directed by your health care provider. °· Do not lift over 10 lb (4.5 kg) for 5 days after your procedure or as directed by your health care provider. °· Ask your health care provider when it is okay to: °? Return to work or school. °? Resume usual physical activities or sports. °? Resume sexual activity. °· Do not drive home if you are discharged the same day as the procedure. Have  someone else drive you. °· You may drive 24 hours after the procedure unless otherwise instructed by your health care provider. °· Do not operate machinery or power tools for 24 hours after the procedure. °· If your procedure was done as an outpatient procedure, which means that you went home the same day as your procedure, a responsible adult should be with you for the first 24 hours after you arrive home. °· Keep all follow-up visits as directed by your health care provider. This is important. °Contact a health care provider if: °· You have a fever. °· You have chills. °· You have increased bleeding from the radial site. Hold pressure on the site. °Get help right away if: °· You have unusual pain at the radial site. °· You have redness, warmth, or swelling at the radial site. °· You have drainage (other than a small amount of blood on the dressing) from the radial site. °· The radial site is bleeding, and the bleeding does not stop after 30 minutes of holding steady pressure on the site. °· Your arm or hand becomes pale, cool, tingly, or numb. °This information is not intended to replace advice given to you by your health care provider. Make sure you discuss any questions you have with your health care provider. °Document Released: 07/19/2010 Document Revised: 11/22/2015 Document Reviewed: 01/02/2014 °Elsevier Interactive Patient Education © 2018 [employer] ° °

## 2017-12-30 ENCOUNTER — Encounter (HOSPITAL_COMMUNITY): Payer: Self-pay | Admitting: Internal Medicine

## 2017-12-30 MED FILL — Heparin Sod (Porcine)-NaCl IV Soln 1000 Unit/500ML-0.9%: INTRAVENOUS | Qty: 1000 | Status: AC

## 2018-01-06 ENCOUNTER — Ambulatory Visit (HOSPITAL_COMMUNITY)
Admission: RE | Admit: 2018-01-06 | Discharge: 2018-01-06 | Disposition: A | Discharge status: Home / Self Care | Payer: Medicaid Other | Source: Ambulatory Visit | Attending: Cardiology | Admitting: Cardiology

## 2018-01-06 VITALS — BP 158/94 | HR 76 | Wt 187.0 lb

## 2018-01-06 DIAGNOSIS — Z8673 Personal history of transient ischemic attack (TIA), and cerebral infarction without residual deficits: Secondary | ICD-10-CM | POA: Insufficient documentation

## 2018-01-06 DIAGNOSIS — I130 Hypertensive heart and chronic kidney disease with heart failure and stage 1 through stage 4 chronic kidney disease, or unspecified chronic kidney disease: Secondary | ICD-10-CM | POA: Insufficient documentation

## 2018-01-06 DIAGNOSIS — E1122 Type 2 diabetes mellitus with diabetic chronic kidney disease: Secondary | ICD-10-CM | POA: Diagnosis not present

## 2018-01-06 DIAGNOSIS — Z951 Presence of aortocoronary bypass graft: Secondary | ICD-10-CM | POA: Insufficient documentation

## 2018-01-06 DIAGNOSIS — N189 Chronic kidney disease, unspecified: Secondary | ICD-10-CM | POA: Diagnosis not present

## 2018-01-06 DIAGNOSIS — I2510 Atherosclerotic heart disease of native coronary artery without angina pectoris: Secondary | ICD-10-CM | POA: Diagnosis not present

## 2018-01-06 DIAGNOSIS — Z87891 Personal history of nicotine dependence: Secondary | ICD-10-CM | POA: Insufficient documentation

## 2018-01-06 DIAGNOSIS — F1010 Alcohol abuse, uncomplicated: Secondary | ICD-10-CM | POA: Insufficient documentation

## 2018-01-06 DIAGNOSIS — I5022 Chronic systolic (congestive) heart failure: Secondary | ICD-10-CM | POA: Diagnosis present

## 2018-01-06 LAB — BASIC METABOLIC PANEL
Anion gap: 8 (ref 5–15)
BUN: 12 mg/dL (ref 6–20)
CHLORIDE: 102 mmol/L (ref 98–111)
CO2: 29 mmol/L (ref 22–32)
Calcium: 9.3 mg/dL (ref 8.9–10.3)
Creatinine, Ser: 1.38 mg/dL — ABNORMAL HIGH (ref 0.61–1.24)
GFR calc Af Amer: >60 mL/min (ref >60)
GFR calc non Af Amer: 54 mL/min — ABNORMAL LOW (ref >60)
GLUCOSE: 101 mg/dL — AB (ref 70–99)
POTASSIUM: 4.7 mmol/L (ref 3.5–5.1)
Sodium: 139 mmol/L (ref 135–145)

## 2018-01-06 NOTE — Progress Notes (Signed)
Referring Physician: Dr. Mosetta Putt PCP: Howard Pouch, MD Cardiologist: Dr. [name]    HPI:     Randy [name] is a 57 y.o. male with history of combined HF (diagnosed 03/2017), HTN, HLD, CAD s/p CABG 2010 and PCI (last ~2014), former tobacco use, CVA, and AAA.  Admitted 03/2017 with CHF exacerbation. Echo showed EF 30-35% with grade 2 DD. Cardiology was consulted and he refused [facility] for further work up.   He recently established care with HF clinic.  He denied CP but gets SOB with exertion while outside, but it improves once he gets inside. Can walk about 50 feet before he gets SOB. SOB has been going on for several years. Doesn't feel like this has progressed.  He snores, but has never had a sleep study. Chronic orthopnea. Denies PND. No wheezing.  He has occasional BLE edema. Appetite and energy is great. Goes fishing frequently. No cough, fever, or chills. Limits salt intake. Takes medication as ordered.   Quit smoking Jan 1. Smoked for ~50 years prior 1 ppd. ETOH use (Bud Light) 18 pack/week, decreased from 18-24 beers daily. Lives alone. Would not want another CABG.   12/29/17 L/R Heart cath  Ost RCA to Prox RCA lesion is 100% stenosed.  Mid LM lesion is 80% stenosed.  Prox LAD lesion is 100% stenosed.  Ost 2nd Mrg to 2nd Mrg lesion is 100% stenosed.  2nd Mrg lesion is 99% stenosed.  Origin to Prox Graft lesion is 100% stenosed.  Prox Cx to Mid Cx lesion is 30% stenosed.  Mid Graft lesion is 30% stenosed  RA = 4 RV = 22/5 PA = 27/10 (16) PCW = 15 Fick cardiac output/index = 5.1/2.5 FA sat = 94% PA sat = 66%, 66%  Today he presents to initial pharmacist medication titration clinic. Heart cath last week CAD - possible PCI to LM next week,euvolemic and compensated hemodynamics. Since heart cath he has decreased to only 4 beers a day except July 4th beer, moonshine, wine.  He has large property and  tries to walk around it daily but does get winded and takes breaks.   Today  he has LE edema and ReDS reading 81 - he has been taking alka-seltzer 2x day for week and eating baking soda for heartburn. He is non compliant with Entresto 49-51  because he gets dizzy after taking it.     . Shortness of breath/dyspnea on exertion? yes  - with exertion . Orthopnea/PND? No . Edema? Yes LE B/L  . Lightheadedness/dizziness? Yes - with Entresto use . Daily weights at home? no . Blood pressure/heart rate monitoring at home? no . Following low-sodium/fluid-restricted diet? no  HF Medications: Carvedilol 12.5mg  BID Furosemide 20mg  Daily Potassium Daily Entresto 49-51mg  BID  Has the patient been experiencing any side effects to the medications prescribed?  yes  Does the patient have any problems obtaining medications due to transportation or finances?   no  Understanding of regimen: poor Understanding of indications: poor Potential of compliance: fair Patient understands to avoid NSAIDs - knows / understands - still takes IBU every couple of days. Patient understands to avoid decongestants.    Pertinent Lab Values: 01/06/18 . Serum creatinine 1.38 (up slightly), CO2 29 , Potassium 4.7, Sodium 139,    Vital Signs: . Weight: 187lb (dry weight:  ) . Blood pressure: 158/92 . Heart rate: 76 . O2 98% . ReDS 42%  Assessment: 1. 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 NYHA class IIIsymptoms.  - volume status  elevated today, recent increase Sodium intake, B/L LE edema, increased SOS, ReDS reading 42% - increase Furosemide 40mg  x2 days then back to 20mg  daily. -BP elevated 158/94 HR 76 - He did not take Entresto this am as it makes him dizzy and didn't want to drive while dizzy - samples given of Entresto 24-26mg  BID - stressed importance of compliance of lower dose  - maybe starting lower and titrating will help with dizzy feeling -Labs K stable Cr bump slightly, but within BL range   - Basic disease state pathophysiology, medication indication, mechanism  and side effects reviewed at length with patient and he verbalized understanding  CAD s/p CABG 2010 - On ASA, plavix, statin, BB - No s/s ischemia - CAD on recent cath - possible PCI in August   3. Hx of Tobacco use - Quit smoking January this year. Congratulated on cessation.   4. HTN - Elevated 160 - no entresto today - decrease Entresto 24-26mg  BID to minimize dizziness and increase compliance - then titrate back to 49-51mg BID  5. CKD - Baseline ~1.2-1.4 - BMET today  6. ?OSA - Discussed possible sleep study referral for next visit  7. Alcohol abuse - Drinks 4 beers daily - down from 18 beers/day. Encouraged to maintain   Plan: 1) Medication changes: Based on clinical presentation, vital signs and recent labs - volume status elevated today- increase Furosemide 40mg  x2 days then back to 20mg  daily. -BP elevated 158/94 HR 76 - He did not take Entresto this am as it makes him dizzy and didn't want to drive while dizzy - samples given of Entresto 24-26mg  BID - stressed importance of compliance of lower dose  - and plan to increase back to 49-51mg  BID next week -Labs K stable Cr bump slightly, but within BL range  2) Labs: Follow up BMET next week 3) Follow-up: pharmacist clinic next week July 17    Ricarda Frame.D. CPP, BCPS Clinical Pharmacist [phone] 01/06/2018 10:03 PM

## 2018-01-06 NOTE — Patient Instructions (Addendum)
Stop Alka-Seltzer and Baking Soda  Take Furosemide 2 tablets today and tomorrow then go back to 1 tablet a day  Stop Entresto 49-51mg  Start Entresto Samples 24-26mg  twice daily  Follow up appointment July 17 Wed at [facility]

## 2018-01-13 ENCOUNTER — Ambulatory Visit (HOSPITAL_COMMUNITY)
Admission: RE | Admit: 2018-01-13 | Discharge: 2018-01-13 | Disposition: A | Discharge status: Home / Self Care | Payer: Medicaid Other | Source: Ambulatory Visit | Attending: Cardiology | Admitting: Cardiology

## 2018-01-13 VITALS — BP 142/100 | HR 80 | Wt 181.0 lb

## 2018-01-13 DIAGNOSIS — Z87891 Personal history of nicotine dependence: Secondary | ICD-10-CM | POA: Diagnosis not present

## 2018-01-13 DIAGNOSIS — I428 Other cardiomyopathies: Secondary | ICD-10-CM | POA: Diagnosis not present

## 2018-01-13 DIAGNOSIS — N189 Chronic kidney disease, unspecified: Secondary | ICD-10-CM | POA: Insufficient documentation

## 2018-01-13 DIAGNOSIS — Z951 Presence of aortocoronary bypass graft: Secondary | ICD-10-CM | POA: Diagnosis not present

## 2018-01-13 DIAGNOSIS — E785 Hyperlipidemia, unspecified: Secondary | ICD-10-CM | POA: Diagnosis not present

## 2018-01-13 DIAGNOSIS — I2510 Atherosclerotic heart disease of native coronary artery without angina pectoris: Secondary | ICD-10-CM | POA: Diagnosis not present

## 2018-01-13 DIAGNOSIS — F1010 Alcohol abuse, uncomplicated: Secondary | ICD-10-CM | POA: Diagnosis not present

## 2018-01-13 DIAGNOSIS — I5022 Chronic systolic (congestive) heart failure: Secondary | ICD-10-CM | POA: Insufficient documentation

## 2018-01-13 DIAGNOSIS — I130 Hypertensive heart and chronic kidney disease with heart failure and stage 1 through stage 4 chronic kidney disease, or unspecified chronic kidney disease: Secondary | ICD-10-CM | POA: Diagnosis present

## 2018-01-13 LAB — BASIC METABOLIC PANEL
ANION GAP: 8 (ref 5–15)
BUN: 17 mg/dL (ref 6–20)
CALCIUM: 9.2 mg/dL (ref 8.9–10.3)
CO2: 27 mmol/L (ref 22–32)
Chloride: 105 mmol/L (ref 98–111)
Creatinine, Ser: 1.34 mg/dL — ABNORMAL HIGH (ref 0.61–1.24)
GFR calc Af Amer: >60 mL/min (ref >60)
GFR, EST NON AFRICAN AMERICAN: 56 mL/min — AB (ref >60)
Glucose, Bld: 149 mg/dL — ABNORMAL HIGH (ref 70–99)
POTASSIUM: 4.1 mmol/L (ref 3.5–5.1)
Sodium: 140 mmol/L (ref 135–145)

## 2018-01-13 NOTE — Progress Notes (Signed)
Referring Physician:Dr. Mosetta Putt GBM:[id number], Leotis Shames, MD Cardiologist:Dr. [name]   HPI:   Randy [name] a 57 y.o.malewith history of combined HF (diagnosed 03/2017), HTN, HLD, CAD s/p CABG2010 and PCI (last ~2014), former tobacco use, CVA, and AAA.  Admitted 03/2017 with CHF exacerbation. Echo showed EF 30-35% with grade 2 DD. Cardiology was consulted and he refused [facility] for further work up.  He recently established care with HF clinic.  He denied CP but gets SOB with exertion while outside, but it improves once he gets inside. Can walk about 50 feet before he gets SOB. SOB has been going on for several years.Doesn't feel like this has progressed.He snores, but has never had a sleep study. Chronic orthopnea. Denies PND. No wheezing.He has occasional BLE edema. Appetite and energy is great. Goes fishing frequently.No cough, fever, or chills. Limits salt intake. Takes medication as ordered.   Quit smoking Jan 1. Smoked for ~50 years prior 1 ppd. ETOH use(Bud Light)18 pack/week, decreased from 18-24 beers daily. Lives alone. Would not want another CABG.  Today he presents to HF clinic for follow up pharmacist medication titration clinic.  At last visit he was given samples of lower entreso dose 24-26mg  to see if he tolerated this dose without dizziness.  He states compliance and that he tolerates lower dose well.  At last visit his volume was elevated and furosemide was increased for 2 days.  Today he has less LE edema and weight down 6lb.  He continues to be a heavy drinker - Drink at least a 6pack or more and moonshine each nite. - He drinks instead of eating.     . Shortness of breath/dyspnea on exertion? no  . Orthopnea/PND? no . Edema? no . Lightheadedness/dizziness? no . Daily weights at home? no . Blood pressure/heart rate monitoring at home? no . Following low-sodium/fluid-restricted diet? no  HF Medications: Carvedilol 12.5mg  BID Furosemide 20mg  Daily Potassium  daily Entresto 24-26mg  BID  Has the patient been experiencing any side effects to the medications prescribed?  no  Does the patient have any problems obtaining medications due to transportation or finances?   no  Understanding of regimen: fair Understanding of indications: fair Potential of compliance: fair Patient understands to avoid NSAIDs. Patient understands to avoid decongestants.    Pertinent Lab Values: . Serum creatinine 1.34 (stable) , CO2 27, Potassium 4.1 (stable), Sodium 139,   Vital Signs: . Weight: 181 lb (dry weight: 185lb) . Blood pressure: 142/100 . Heart rate: 80 . O2 96% . ReDS 43%  Assessment: 1. Chronicsystolic CHF (EF 55%), due to NICM/ICM. NYHA class IIIsymptoms.  - volume status stable today,  B/L LE edema improved from last visit, weight down lower than has been, ReDS reading elevated 43% - but doesn't seem to correlate with assessment today - will continue current Furosemide 20mg  daily -BP elevated but improved 142/100 HR 80 -  samples given of Entresto 24-26mg  BID -states compliance with lower dose -Labs K stable Cr stable -continue current HF medications Carvedilol 12.5mg  BID Furosemide 20mg  Daily Potassium daily Entresto 24-26mg  BID  - Basic disease state pathophysiology, medication indication, mechanism and side effects reviewed at length with patient and he verbalized understanding  2.CAD s/p CABG 2010 - On ASA, plavix, statin, BB - No s/s ischemia - CAD on recent cath - possible PCI in August  3. Hx of Tobacco use - Quit smokingJanuary this year.Congratulated on cessation.  4. HTN - Elevated but improved - states compliance with lower Entresto 24-26mg  BID  to minimize dizziness and increase compliance - then titrate back to 49-51mg BID at next visit  5. CKD - Baseline ~1.2-1.4 - BMET today  stable  6. ?OSA -Discussed possible sleep study referral per MD  7. Alcohol abuse - Drinks 4 -12 beers daily +  moonshine more on the weekends - down from 18-24 beers/day. Encouraged to maintain    Plan: 1) Medication changes: Based on clinical presentation, vital signs and recent labs - volume status stable today- continue Furosemide 20mg  daily. -BP elevated 140/100 HR 80 - continue  Entresto 24-26mg  BID - stressed importance of compliance of lower dose  - and plan to increase back to 49-51mg  BID next week -Labs K and Cr stable within BL range  - continue current HF medications Carvedilol 12.5mg  BID Furosemide 20mg  Daily Potassium daily Entresto 24-26mg  BID  2) Labs: a next visit 3) Follow-up: July 24 - pharmacist HF clinic  Leota Sauers Pharm.D. CPP, BCPS Clinical Pharmacist [phone] 01/13/2018 5:13 PM

## 2018-01-13 NOTE — Patient Instructions (Signed)
Entresto 24-26mg  (this is the lower dose) take twice daily  Follow up next Wednesday 7/24 at 8am for ECHO and clinic vist at [facility]

## 2018-01-20 ENCOUNTER — Ambulatory Visit (HOSPITAL_COMMUNITY)
Admission: RE | Admit: 2018-01-20 | Discharge: 2018-01-20 | Disposition: A | Discharge status: Home / Self Care | Payer: Medicaid Other | Source: Ambulatory Visit | Attending: Internal Medicine | Admitting: Internal Medicine

## 2018-01-20 ENCOUNTER — Ambulatory Visit (HOSPITAL_COMMUNITY): Payer: Medicaid Other

## 2018-01-20 DIAGNOSIS — Z79899 Other long term (current) drug therapy: Secondary | ICD-10-CM | POA: Insufficient documentation

## 2018-01-20 DIAGNOSIS — Z8673 Personal history of transient ischemic attack (TIA), and cerebral infarction without residual deficits: Secondary | ICD-10-CM | POA: Insufficient documentation

## 2018-01-20 DIAGNOSIS — N189 Chronic kidney disease, unspecified: Secondary | ICD-10-CM | POA: Insufficient documentation

## 2018-01-20 DIAGNOSIS — I130 Hypertensive heart and chronic kidney disease with heart failure and stage 1 through stage 4 chronic kidney disease, or unspecified chronic kidney disease: Secondary | ICD-10-CM | POA: Diagnosis not present

## 2018-01-20 DIAGNOSIS — F1010 Alcohol abuse, uncomplicated: Secondary | ICD-10-CM | POA: Insufficient documentation

## 2018-01-20 DIAGNOSIS — E785 Hyperlipidemia, unspecified: Secondary | ICD-10-CM | POA: Insufficient documentation

## 2018-01-20 DIAGNOSIS — Z87891 Personal history of nicotine dependence: Secondary | ICD-10-CM | POA: Insufficient documentation

## 2018-01-20 DIAGNOSIS — I2510 Atherosclerotic heart disease of native coronary artery without angina pectoris: Secondary | ICD-10-CM | POA: Diagnosis not present

## 2018-01-20 DIAGNOSIS — Z951 Presence of aortocoronary bypass graft: Secondary | ICD-10-CM | POA: Insufficient documentation

## 2018-01-20 DIAGNOSIS — Z7982 Long term (current) use of aspirin: Secondary | ICD-10-CM | POA: Diagnosis not present

## 2018-01-20 DIAGNOSIS — I5022 Chronic systolic (congestive) heart failure: Secondary | ICD-10-CM | POA: Diagnosis not present

## 2018-01-20 MED ORDER — SACUBITRIL-VALSARTAN 49-51 MG PO TABS: 1.0000 | ORAL_TABLET | Freq: Two times a day (BID) | ORAL | 6 refills | Status: DC

## 2018-01-20 NOTE — Progress Notes (Signed)
  Echocardiogram 2D Echocardiogram has been performed.  Tongela Encinas L Androw 01/20/2018, 8:41 AM

## 2018-01-20 NOTE — Patient Instructions (Addendum)
Increase Entresto back to 49-51mg  BID  Follow directions for cath lab for next week  Follow Up with Dr. [name] 03/24/18 at 2:20

## 2018-01-20 NOTE — Progress Notes (Signed)
Referring Physician:Dr. Mosetta Putt ZOX:WRUE, Leotis Shames, MD Cardiologist:Dr. [name]   HPI:   Randy [name] a 57 y.o.malewith history of combined HF (diagnosed 03/2017), HTN, HLD, CAD s/p CABG2010 and PCI (last ~2014), former tobacco use, CVA, and AAA.  Admitted 03/2017 with CHF exacerbation. Echo showed EF 30-35% with grade 2 DD. Cardiology was consulted and he refused [facility] for further work up.  Herecentlyestablishedcare with HF clinic. He deniedCP butgets SOB with exertion while outside, but it improves once he gets inside. Can walk about 50 feet before he gets SOB. SOB has been going on for several years.Doesn't feel like this has progressed.He snores, but has never had a sleep study. Chronic orthopnea. Denies PND. No wheezing.He has occasional BLE edema. Appetite and energy is great. Goes fishing frequently.No cough, fever, or chills. Limits salt intake. Takes medication as ordered.   Quit smoking Jan 1. Smoked for ~50 years prior 1 ppd. ETOH use(Bud Light)18 pack/week, decreased from 18-24 beers daily. Lives alone. Would not want another CABG.  Today he presents to HF clinic for follow up pharmacist medication titration visit.  He had recently presented  to HF clinic c/o dizziness with Entresto 49-51mg  BID - he was given samples of lower dose and  states compliance and that he tolerates lower dose well.  His BP remains elevated.  His weight is stable. He is able to walk further now and walks his large property daily.   He continues to be a heavy drinker - Drink at least a 6pack or more and moonshine each nite. - He drinks instead of eating - He is unwilling to make any changes to this.  Enjoys life - he says.      . Shortness of breath/dyspnea on exertion? no  . Orthopnea/PND? no . Edema? no . Lightheadedness/dizziness? no . Daily weights at home? no . Blood pressure/heart rate monitoring at home? no . Following low-sodium/fluid-restricted diet? no  HF  Medications: Carvedilol 12.5mg  BID Furosemide 20mg  Daily Entresto 24-26mg  BID  Has the patient been experiencing any side effects to the medications prescribed?  no  Does the patient have any problems obtaining medications due to transportation or finances?   no  Understanding of regimen: fair Understanding of indications: fair Potential of compliance: fair Patient understands to avoid NSAIDs. - takes 800mg  few times a day - for pain - continue to counsel to take APAP Patient understands to avoid decongestants.    Pertinent Lab Values:01/13/18 . Serum creatinine 1.3 (stable), CO2 27 , Potassium 4.1 (stable), Sodium 140   Vital Signs: . Weight: 181lb (dry weight: 181lb) . Blood pressure: 136/102  . Heart rate: 62 . O2 97% . ReDS 41%  Assessment: 1. Chronicsystolic CHF (EF 45% on follow up ECHO today - unchanged from previous), due NICM/ICM. NYHA class II symptoms. -Volume status stable - no edema, no abdominal distension, no increased SOB, despite ReDS reading elevated 41% but lower than previous  - no change in diuretic -BP elevated 136/102 with no dizziness on lower dose Entresto > will increase back to 48-51mg  BID, HR 62 stable -no room to increase BB - Cr and K stable last week with no changes - not drawn today - will follow up next week pre-cath -continue other HF medicaitons Carvedilol 12.5mg  BID Furosemide 20mg  Daily - Basic disease state pathophysiology, medication indication, mechanism and side effects reviewed at length with patient and he verbalized understanding   2.CAD s/p CABG 2010 - On ASA, plavix, statin, BB - No s/s ischemia -CAD on recent  cath -possible PCI next week  3. Hx of Tobacco use - Quit smokingJanuary this year.Congratulated on cessation.  4. HTN - Elevated will increase Entresto back to 49-51mg BID  5. CKD - Baseline ~1.2-1.4 - BMET  stable  6. ?OSA -Discussed possible sleep study referral per MD  7. Alcohol abuse -  Drinks4 -12beers daily + moonshine more on the weekends - down from 18-24 beers/day. Encouraged to maintain - seems to have reverted back to increased ETOH encouraged to decrease - not interested    Plan: 1) Medication changes: Based on clinical presentation, Volume status stable   - no change in diuretic -BP elevated 136/102- increase Entresto back to 48-51mg  BID, HR 62 stable - no room to increase carvedilol - Cr and K stable last week with no changes - not drawn today  -continue other HF medicaitons Carvedilol 12.5mg  BID Furosemide 20mg  Daily   2) Labs: Next week precath 3) Follow-up: Cath lab next week                        Dr [name] 03/24/18  Leota Sauers Pharm.D. CPP, BCPS Clinical Pharmacist [phone] 01/20/2018 10:15 AM

## 2018-01-25 ENCOUNTER — Telehealth: Payer: Self-pay | Admitting: *Deleted

## 2018-01-25 DIAGNOSIS — I5022 Chronic systolic (congestive) heart failure: Secondary | ICD-10-CM

## 2018-01-25 DIAGNOSIS — I2510 Atherosclerotic heart disease of native coronary artery without angina pectoris: Secondary | ICD-10-CM

## 2018-01-25 DIAGNOSIS — Z01812 Encounter for preprocedural laboratory examination: Secondary | ICD-10-CM

## 2018-01-25 NOTE — Telephone Encounter (Addendum)
Coronary Stent Intervention scheduled at [facility] for: Thursday January 28, 2018 7:30 AM  I spoke with patient, I have made arrangements for him to come to [facility] 01/26/18  for BMP/CBC prior to stent intervention Dr Excell Seltzer Jan 28, 2018.

## 2018-01-26 ENCOUNTER — Other Ambulatory Visit: Payer: Medicaid Other

## 2018-01-26 DIAGNOSIS — Z01812 Encounter for preprocedural laboratory examination: Secondary | ICD-10-CM | POA: Diagnosis not present

## 2018-01-26 DIAGNOSIS — I2510 Atherosclerotic heart disease of native coronary artery without angina pectoris: Secondary | ICD-10-CM | POA: Diagnosis not present

## 2018-01-26 DIAGNOSIS — I5022 Chronic systolic (congestive) heart failure: Secondary | ICD-10-CM | POA: Diagnosis not present

## 2018-01-26 LAB — CBC
HEMOGLOBIN: 15.1 g/dL (ref 13.0–17.7)
Hematocrit: 44.9 % (ref 37.5–51.0)
MCH: 28.7 pg (ref 26.6–33.0)
MCHC: 33.6 g/dL (ref 31.5–35.7)
MCV: 85 fL (ref 79–97)
PLATELETS: 189 10*3/uL (ref 150–450)
RBC: 5.27 x10E6/uL (ref 4.14–5.80)
RDW: 15.4 % (ref 12.3–15.4)
WBC: 6.5 10*3/uL (ref 3.4–10.8)

## 2018-01-26 LAB — BASIC METABOLIC PANEL
BUN/Creatinine Ratio: 11 (ref 9–20)
BUN: 12 mg/dL (ref 6–24)
CALCIUM: 8.8 mg/dL (ref 8.7–10.2)
CO2: 22 mmol/L (ref 20–29)
Chloride: 101 mmol/L (ref 96–106)
Creatinine, Ser: 1.12 mg/dL (ref 0.76–1.27)
GFR calc non Af Amer: 72 mL/min/{1.73_m2} (ref >59)
GFR, EST AFRICAN AMERICAN: 83 mL/min/{1.73_m2} (ref >59)
Glucose: 106 mg/dL — ABNORMAL HIGH (ref 65–99)
POTASSIUM: 4.6 mmol/L (ref 3.5–5.2)
SODIUM: 140 mmol/L (ref 134–144)

## 2018-01-27 NOTE — Telephone Encounter (Signed)
Pt contacted pre-coronary stent intervention  scheduled at [facility] for: Thursday January 28, 2018 7:30 AM Verified arrival time and place: [facility] Main Entrance A at: 5:30 AM  No solid food after midnight prior to cath, clear liquids until 5 AM day of procedure. Verify allergies in Epic Verify no diabetes medications.  Hold: Furosemide AM of procedure.  Except hold medications AM meds can be  taken pre-cath with sip of water including: ASA 81 mg Clopidogrel 75 mg  Confirm patient has responsible person to drive home post procedure and for 24 hours after you arrive home.   LMTCB to discuss instructions with patient.

## 2018-01-28 ENCOUNTER — Telehealth: Payer: Self-pay

## 2018-01-28 ENCOUNTER — Ambulatory Visit (HOSPITAL_COMMUNITY): Admission: RE | Admit: 2018-01-28 | Payer: Medicaid Other | Source: Ambulatory Visit | Admitting: Cardiovascular Disease

## 2018-01-28 SURGERY — CORONARY STENT INTERVENTION
Anesthesia: LOCAL

## 2018-01-28 NOTE — Telephone Encounter (Signed)
Scheduled patient for evaluation 8/5 with D. Dunn. He was grateful for call and agrees with treatment plan.

## 2018-01-28 NOTE — Telephone Encounter (Signed)
-----   Message from Jolyn Nap, RN sent at 01/28/2018  7:41 AM EDT ----- Regarding: Office appt Bobbye Morton was set up for PCI with Excell Seltzer today, he called and said he was sick was going to reschedule.  Dr Excell Seltzer said it has been over a month since seen, so can you call him and get him set up with app appt..  Thanks  Janne Napoleon Cath lab

## 2018-01-28 NOTE — Telephone Encounter (Signed)
Per [facility]-pt cancelled procedure scheduled for 01/28/18.

## 2018-01-31 ENCOUNTER — Encounter: Payer: Self-pay | Admitting: Physician Assistant

## 2018-01-31 NOTE — Progress Notes (Signed)
Cardiology Office Note    Date:  02/01/2018  ID:  Randy [name], DOB 1960/12/13, MRN [id number] PCP:  Randy Pouch, MD  Cardiologist:  Dr. Gala [name] - CHF clinic is actively following   Chief Complaint: discuss cath  History of Present Illness:  Randy [name] is a 57 y.o. male with history of chronic combined HF (diagnosed 03/2017), HTN, HLD, CAD (s/p CABG 2010), alcohol abuse, former tobacco use, prior cocaine exposure 2010 (pt states occurred unknowingly), remote prior R CEA, stenting of L carotid 2014, PAD s/p failed attempt at stenting of the left common iliac artery 2014, CKD III, CVA, and 4.3cm AAA (2017) who presents for updated evaluation prior to cath.   He was admitted 03/2017 with CHF exacerbation. Echo showed EF 30-35% with grade 2 DD (55% in 2017 for comparison). Cardiology was consulted and he refused [facility] for further work up. More recently he established care with Dr. Gala [name] for CHF. His note indicates pt was drinking 8-10 bottles of water/day, had quit smoking, and was drinking 18 pack/week, decreased from 18-24 beers daily. He underwent R & LHC 12/29/17 showing severe native CAD, patent LIMA-LAD, occluded SVG-RCA, SVG-OM2 with 95% prox lesion, EF 25%, well compensated hemodynamics. Dr. Prescott Gum cath note states "Suspect EF is down mainly due to ETOH but does have unprotected AV-groove LCX which feeds colalterals to distal RCA. Would consider PCI of distal left main. " Repeat echo 01/20/18 showed EF 30-35%, grade 2 DD, severely dilated LA, mildly dilated RV with mildly reduced RV function, moderate TR, elevated CVP. Has been followed in CHF-pharmacist clinic for med titration. He has not tolerated Entresto due to it making him "sick on his stomach" and dizzy. PCI was planned for 8/1 but pt was sick so called to cancel. He was set up in clinic with me as it had been >30 days out from last H/p. Last labs 01/26/18 normal CBC, K 4.6, Cr 1.12 (baseline 1.1-1.4), last TSH 2017, no recent  lipids on file.'  Of note, last carotid study 2017 - RICA 80-99%, LICA ~50%, left vertebral artery is patent with retrograde flow, suggestive of possible proximal obstruction. Dr. Myra [name] recommended angiography and stenting with distal embolic protection of the recurrent stenosis in the right common carotid artery. Unfortunately it does not look like the patient scheduled this in 06/2016 then cancelled and never followed up for this. He was also describing claudication and further studies/management were planned but have not occurred. He is also overdue for f/u of AAA. I think he got confused that he was seeing the "heart and vascular" clinic at [facility] and assumed this was vascular as well.  I reviewed the above with Dr. Gala [name] (severe R carotid disease) who plans to review further with Dr. Excell [name] before moving forward with PCI. The patient returns for follow-up today overall feeling well. He has chronic unchanged orthopnea and DOE. No chest pain, palpitations, edema, or dyspnea. He admits he completely stopped his Sherryll Burger and is not willing to take it because it makes him dizzy and sick on his stomach with blurry vision. He denies any amaurosis fugax. He is no longer taking atorvastatin because he swears CHF team told him to stop when he started [place]. This is not substantiated in the notes. It was lisinopril that was stopped (which he does affirm he's no longer taking). He then said maybe it's because atorvastatin wasn't refilled as the reason why he stopped taking it. He now drinks a 12 pack per week which he feels is  a significant reduction from his prior EtOH.   Past Medical History:  Diagnosis Date  . AAA (abdominal aortic aneurysm) [facility]) July 2013  . Alcohol abuse   . Anxiety   . CAD (coronary artery disease)    a. s/p CABG 2010.  . Carotid artery occlusion    a. prior R CEA, stenting of L carotid 2014. b. Severe carotid restenosis in 2017 -> planned for surgery but pt did not f/u.  [name]  Chronic combined systolic and diastolic CHF (congestive heart failure) (HCC)   . GERD (gastroesophageal reflux disease)   . Heart attack (HCC) 2010  . Hx of CABG 2010  . Hyperlipidemia   . Hypertension   . Lung nodule   . Polysubstance abuse (HCC)    a. mention of cocaine positivity in 2010 with patient stating unknowingly exposed, also EtOH.  [name] PVD (peripheral vascular disease) (HCC)    a. PAD s/p failed attempt at stenting of the left common iliac artery 2014.  . Stroke (HCC)   . Uncontrolled hypertension 02/02/2012    Past Surgical History:  Procedure Laterality Date  . ABDOMINAL AORTAGRAM N/A 04/19/2013   Procedure: ABDOMINAL AORTAGRAM;  Surgeon: Randy Libman, MD;  Location: [facility] CATH LAB;  Service: Cardiovascular;  Laterality: N/A;  . CAROTID ENDARTERECTOMY Left 01-13-13   Attempted cea  . CAROTID STENT INSERTION Left 02/22/2013   Procedure: CAROTID STENT INSERTION;  Surgeon: Randy Libman, MD;  Location: [facility] CATH LAB;  Service: Cardiovascular;  Laterality: Left;  . CORONARY ARTERY BYPASS GRAFT  2010  . ENDARTERECTOMY Left 01/13/2013   Procedure: ATTEMPTED ENDARTERECTOMY CAROTID;  Surgeon: Randy Libman, MD;  Location: [facility] OR;  Service: Vascular;  Laterality: Left;  . RIGHT/LEFT HEART CATH AND CORONARY/GRAFT ANGIOGRAPHY N/A 12/29/2017   Procedure: RIGHT/LEFT HEART CATH AND CORONARY/GRAFT ANGIOGRAPHY;  Surgeon: Randy Patty, MD;  Location: [facility] INVASIVE CV LAB;  Service: Cardiovascular;  Laterality: N/A;    Current Medications: Current Meds  Medication Sig  . aspirin EC 81 MG tablet Take 1 tablet (81 mg total) by mouth daily.  . carvedilol (COREG) 12.5 MG tablet Take 1 tablet (12.5 mg total) by mouth 2 (two) times daily with a meal.  . citalopram (CELEXA) 20 MG tablet Take 1 tablet (20 mg total) by mouth daily.  . clopidogrel (PLAVIX) 75 MG tablet Take 1 tablet (75 mg total) by mouth daily with breakfast.  . diphenhydramine-acetaminophen (TYLENOL PM) 25-500 MG TABS tablet  Take 1 tablet by mouth at bedtime as needed (for sleep).  . ezetimibe (ZETIA) 10 MG tablet Take 1 tablet (10 mg total) by mouth daily.  . furosemide (LASIX) 20 MG tablet Take 1 tablet (20 mg total) by mouth daily.  [name] omeprazole (PRILOSEC OTC) 20 MG tablet Take 20 mg by mouth daily.  . sacubitril-valsartan (ENTRESTO) 49-51 MG Take 1 tablet by mouth 2 (two) times daily.      Allergies:   Patient has no known allergies.   Social History   Socioeconomic History  . Marital status: Widowed    Spouse name: Not on file  . Number of children: 1  . Years of education: 9th  . Highest education level: Not on file  Occupational History  . Occupation: not working.   Social Needs  . Financial resource strain: Not on file  . Food insecurity:    Worry: Not on file    Inability: Not on file  . Transportation needs:    Medical: Not on file  Non-medical: Not on file  Tobacco Use  . Smoking status: Former Smoker    Packs/day: 0.50    Years: 30.00    Pack years: 15.00    Types: Cigarettes    Last attempt to quit: 01/28/2017    Years since quitting: 1.0  . Smokeless tobacco: Never Used  Substance and Sexual Activity  . Alcohol use: Yes    Alcohol/week: 3.0 oz    Types: 5 Cans of beer per week    Comment: occasionally  . Drug use: Yes    Types: Marijuana  . Sexual activity: Yes  Lifestyle  . Physical activity:    Days per week: Not on file    Minutes per session: Not on file  . Stress: Not on file  Relationships  . Social connections:    Talks on phone: Not on file    Gets together: Not on file    Attends religious service: Not on file    Active member of club or organization: Not on file    Attends meetings of clubs or organizations: Not on file    Relationship status: Not on file  Other Topics Concern  . Not on file  Social History Narrative   Pt is widowed and lives alone.   Patient is right handed   Patient drinks about 2cups of caffeine daily.        Family History:    The patient's family history includes Asthma in his mother; Cancer in his father; Diabetes in his mother; Hyperlipidemia in his mother; Hypertension in his mother; Lung cancer in his father; Stroke in his sister.  ROS:   Please see the history of present illness. + chronic claudication, no nonhealing wounds All other systems are reviewed and otherwise negative.    PHYSICAL EXAM:   VS:  BP (!) 140/102   Pulse 75   Ht 5\' 9"  (1.753 m)   Wt 184 lb 6.4 oz (83.6 kg)   SpO2 99%   BMI 27.23 kg/m   BMI: Body mass index is 27.23 kg/m. GEN: Well nourished, well developed WM, in no acute distress HEENT: normocephalic, atraumatic Neck: no JVD, soft R carotid bruit, or masses Cardiac: RRR; no murmurs, rubs, or gallops, no edema  Respiratory:  clear to auscultation bilaterally, normal work of breathing GI: soft, nontender, nondistended, + BS MS: no deformity or atrophy Skin: warm and dry, no rash Neuro:  Alert and Oriented x 3, Strength and sensation are intact, follows commands Psych: euthymic mood, full affect  Wt Readings from Last 3 Encounters:  02/01/18 184 lb 6.4 oz (83.6 kg)  01/20/18 181 lb (82.1 kg)  01/13/18 181 lb (82.1 kg)      Studies/Labs Reviewed:   EKG:  EKG was ordered today and personally reviewed by me and demonstrates NSR 66bpm, LVH with repol changes  Recent Labs: 03/30/2017: B Natriuretic Peptide 1,587.1 04/01/2017: Magnesium 1.8 01/26/2018: BUN 12; Creatinine, Ser 1.12; Hemoglobin 15.1; Platelets 189; Potassium 4.6; Sodium 140   Lipid Panel    Component Value Date/Time   CHOL 264 (H) 03/31/2016 0950   TRIG 168 (H) 03/31/2016 0950   HDL 40 03/31/2016 0950   CHOLHDL 6.6 (H) 03/31/2016 0950   VLDL 34 (H) 03/31/2016 0950   LDLCALC 190 (H) 03/31/2016 0950   LDLDIRECT 165 (H) 02/10/2012 1654    Additional studies/ records that were reviewed today include: Summarized above.   ASSESSMENT & PLAN:   1. CAD - Dr. Gala [name] plans to review with Dr. Excell [name]  regarding further plans for PCI. Pt has been out of statin, will resume. He has no baseline lipids on file so will obtain along with updated LFTs. If the patient is tolerating statin at time of follow-up appointment, would consider rechecking liver function/lipid panel in 6-8 weeks.  2. Chronic combined CHF - appears euvolemic. Chronic orthopnea/DOE noted. He is adamant he cannot tolerate Entresto. He was only on lisinopril 2.5mg  before. His blood pressure needs better control. Will start lisinopril 10mg  daily. Recommend f/u in the pharmD CHF clinic in 1 week for further titration. Can consider addition of spironolactone. Discussed RF modification with decreasing alcohol but he's definitely not ready to quit completely. 3. PAD - overdue for follow-up, also never followed up for carotid disease. I have given him the number and asked him to call there today. I think he got confused that he was seeing the "heart and vascular" clinic at [facility] and assumed this was vascular as well. 4. Essential HTN - follow BP with lisinopril. 5. Hyperlipidemia - resume atorvastatin today. See above - has f/u in 02/2018 with CHF clinic which would be good timing to reassess if tolerating.  Disposition: F/u with CHF pharmD clinic in 1 week for med titration, and Dr. Gala [name] as scheduled 02/2018. Pt advised to bring all pill bottles to all visits in the future.   Medication Adjustments/Labs and Tests Ordered: Current medicines are reviewed at length with the patient today.  Concerns regarding medicines are outlined above. Medication changes, Labs and Tests ordered today are summarized above and listed in the Patient Instructions accessible in Encounters.   Signed, Laurann Montana, PA-C  02/01/2018 1:48 PM    [facility] [address] [place], [place], [place]  [address] Phone: [phone]; Fax: [phone]

## 2018-02-01 ENCOUNTER — Encounter: Payer: Self-pay | Admitting: Physician Assistant

## 2018-02-01 ENCOUNTER — Ambulatory Visit (INDEPENDENT_AMBULATORY_CARE_PROVIDER_SITE_OTHER): Payer: Medicaid Other | Admitting: Physician Assistant

## 2018-02-01 VITALS — BP 140/102 | HR 75 | Ht 69.0 in | Wt 184.4 lb

## 2018-02-01 DIAGNOSIS — E785 Hyperlipidemia, unspecified: Secondary | ICD-10-CM

## 2018-02-01 DIAGNOSIS — I2510 Atherosclerotic heart disease of native coronary artery without angina pectoris: Secondary | ICD-10-CM | POA: Diagnosis not present

## 2018-02-01 DIAGNOSIS — I10 Essential (primary) hypertension: Secondary | ICD-10-CM

## 2018-02-01 DIAGNOSIS — I739 Peripheral vascular disease, unspecified: Secondary | ICD-10-CM

## 2018-02-01 DIAGNOSIS — I5042 Chronic combined systolic (congestive) and diastolic (congestive) heart failure: Secondary | ICD-10-CM

## 2018-02-01 MED ORDER — LISINOPRIL 10 MG PO TABS: 10.0000 mg | ORAL_TABLET | Freq: Every day | ORAL | 3 refills | Status: DC

## 2018-02-01 MED ORDER — ATORVASTATIN CALCIUM 80 MG PO TABS: 80.0000 mg | ORAL_TABLET | Freq: Every evening | ORAL | 3 refills | Status: DC

## 2018-02-01 NOTE — Patient Instructions (Addendum)
Medication Instructions:   STOP TAKING:  1.  ENTRESTO 49-51   START TAKING:   1. ATORVASTATIN 80 MG ONCE A DAY IN THE EVENING   2.  LISINOPRIL 10 MG ONCE A  DAY    If you need a refill on your cardiac medications before your next appointment, please call your pharmacy.  Labwork: LFT AND LIPDS TODAY    Testing/Procedures: NONE ORDERED  TODAY    Follow-Up: IN  ONE WEEK  WITH CHF PHARM D LISA CURRAN   Any Other Special Instructions Will Be Listed Below (If Applicable).  Call Dr. Estanislado Spire office today at [phone] to schedule your overdue follow-up with them.  The office will call you when Dr. [name] and Dr. Excell Seltzer have reached a final decision about whether to move forward with your stenting.

## 2018-02-02 LAB — HEPATIC FUNCTION PANEL
ALT: 54 IU/L — ABNORMAL HIGH (ref 0–44)
AST: 53 IU/L — ABNORMAL HIGH (ref 0–40)
Albumin: 4.1 g/dL (ref 3.5–5.5)
Alkaline Phosphatase: 110 IU/L (ref 39–117)
BILIRUBIN, DIRECT: 0.27 mg/dL (ref 0.00–0.40)
Bilirubin Total: 0.8 mg/dL (ref 0.0–1.2)
Total Protein: 6.4 g/dL (ref 6.0–8.5)

## 2018-02-02 LAB — LIPID PANEL
CHOL/HDL RATIO: 4 ratio (ref 0.0–5.0)
Cholesterol, Total: 179 mg/dL (ref 100–199)
HDL: 45 mg/dL (ref >39)
LDL Calculated: 115 mg/dL — ABNORMAL HIGH (ref 0–99)
Triglycerides: 97 mg/dL (ref 0–149)
VLDL Cholesterol Cal: 19 mg/dL (ref 5–40)

## 2018-02-10 ENCOUNTER — Ambulatory Visit (HOSPITAL_COMMUNITY)
Admission: RE | Admit: 2018-02-10 | Discharge: 2018-02-10 | Disposition: A | Discharge status: Home / Self Care | Payer: Medicaid Other | Source: Ambulatory Visit | Attending: Cardiology | Admitting: Cardiology

## 2018-02-10 VITALS — BP 150/98 | HR 67 | Wt 188.4 lb

## 2018-02-10 DIAGNOSIS — N189 Chronic kidney disease, unspecified: Secondary | ICD-10-CM | POA: Insufficient documentation

## 2018-02-10 DIAGNOSIS — I130 Hypertensive heart and chronic kidney disease with heart failure and stage 1 through stage 4 chronic kidney disease, or unspecified chronic kidney disease: Secondary | ICD-10-CM | POA: Diagnosis not present

## 2018-02-10 DIAGNOSIS — I2510 Atherosclerotic heart disease of native coronary artery without angina pectoris: Secondary | ICD-10-CM | POA: Insufficient documentation

## 2018-02-10 DIAGNOSIS — I5022 Chronic systolic (congestive) heart failure: Secondary | ICD-10-CM | POA: Diagnosis not present

## 2018-02-10 DIAGNOSIS — Z09 Encounter for follow-up examination after completed treatment for conditions other than malignant neoplasm: Secondary | ICD-10-CM | POA: Insufficient documentation

## 2018-02-10 DIAGNOSIS — Z951 Presence of aortocoronary bypass graft: Secondary | ICD-10-CM | POA: Insufficient documentation

## 2018-02-10 DIAGNOSIS — F1010 Alcohol abuse, uncomplicated: Secondary | ICD-10-CM | POA: Insufficient documentation

## 2018-02-10 DIAGNOSIS — I110 Hypertensive heart disease with heart failure: Secondary | ICD-10-CM | POA: Diagnosis not present

## 2018-02-10 DIAGNOSIS — G4733 Obstructive sleep apnea (adult) (pediatric): Secondary | ICD-10-CM | POA: Insufficient documentation

## 2018-02-10 DIAGNOSIS — Z87891 Personal history of nicotine dependence: Secondary | ICD-10-CM | POA: Diagnosis not present

## 2018-02-10 LAB — BASIC METABOLIC PANEL
ANION GAP: 8 (ref 5–15)
BUN: 12 mg/dL (ref 6–20)
CALCIUM: 8.8 mg/dL — AB (ref 8.9–10.3)
CHLORIDE: 104 mmol/L (ref 98–111)
CO2: 26 mmol/L (ref 22–32)
Creatinine, Ser: 1.2 mg/dL (ref 0.61–1.24)
GFR calc Af Amer: >60 mL/min (ref >60)
GFR calc non Af Amer: >60 mL/min (ref >60)
GLUCOSE: 111 mg/dL — AB (ref 70–99)
Potassium: 4.1 mmol/L (ref 3.5–5.1)
Sodium: 138 mmol/L (ref 135–145)

## 2018-02-10 MED ORDER — SPIRONOLACTONE 25 MG PO TABS: 25.0000 mg | ORAL_TABLET | Freq: Every day | ORAL | 5 refills | Status: DC

## 2018-02-10 NOTE — Patient Instructions (Addendum)
It was great to see you today!  Please START spironolactone 25 mg (1 tablet) DAILY.   Blood work today. We will call you with any changes.   You are scheduled for blood work again on 02/17/18.   Please keep your appointment with Dr. [name] on 03/24/18.

## 2018-02-10 NOTE — Progress Notes (Signed)
HF MD: Randy [name]  HPI:  Randy [name] a 57 y.o.Caucasian malewith history of combined HF (diagnosed 03/2017), HTN, HLD, CAD s/p CABG2010 and PCI (last ~2014), former tobacco use, CVA, and AAA.  Admitted 03/2017 with CHF exacerbation. Echo showed EF 30-35% with grade 2 DD. Cardiology was consulted and he refused [facility] for further work up.  Herecentlyestablishedcare with HF clinic. He deniedCP butgets SOB with exertion while outside, but it improves once he gets inside. Can walk about 50 feet before he gets SOB. SOB has been going on for several years.Doesn't feel like this has progressed.He snores, but has never had a sleep study. Chronic orthopnea. Denies PND. No wheezing.He has occasional BLE edema. Appetite and energy is great. Goes fishing frequently.No cough, fever, or chills. Limits salt intake. Takes medication as ordered.   Quit smoking Jan 1. Smoked for ~50 years prior 1 ppd. ETOH use(Bud Light)18 pack/week, decreased from 18-24 beers daily. Lives alone. Would not want another CABG.  Today he presents to HF clinic for follow up pharmacist medication titration visit. At last pharmacy visit on 7/24, his Sherryll Burger was increased to 49-51 mg BID. He saw [name], PA, on 8/5 and stated that he cannot tolerate Entresto so she started him on lisinopril 10 mg daily at that time. He states that even on the lowest dose of Entresto he could "hardly see" because he was so dizzy and he was sick on his stomach. He is not willing to retry. He did not take his medications this morning because he was in a rush but is usually compliant with his regimen. He has also cut down significantly on his alcohol intake. He only drank 6 beers last week and is no longer drinking moonshine. Because of this he is now eating more. He is fishing regularly and feels well doing this.    . Shortness of breath/dyspnea on exertion? no  . Orthopnea/PND? no . Edema? Yes - trace . Lightheadedness/dizziness?  no . Daily weights at home? no . Blood pressure/heart rate monitoring at home? no . Following low-sodium/fluid-restricted diet? No - no salt  HF Medications: Carvedilol 12.5 mg PO BID Furosemide 20 mg PO daily Lisinopril 10 mg PO daily   Has the patient been experiencing any side effects to the medications prescribed?  Yes - Entresto as above  Does the patient have any problems obtaining medications due to transportation or finances?   No - [place] Medicaid  Understanding of regimen: good Understanding of indications: good Potential of compliance: good Patient understands to avoid NSAIDs. Patient understands to avoid decongestants.    Pertinent Lab Values: . 02/10/18: Serum creatinine 1.2 (BL ~1.1-1.3), BUN 12, Potassium 4.1, Sodium 138  Vital Signs: . Weight: 188.4 lb (dry weight: 181-184 lb) . Blood pressure: 150/98 mmHg (136-158) . Heart rate: 67 bpm (62-80) . ReDS CLIP elevated 42% but similar to previous at 41%  Assessment: 1. Chronicsystolic CHF (EF 16-10%), due to ICM. NYHA class IIsymptoms.  - Volume status slightly elevated based on weight gain and CLIP reading  - Start spironolactone 25 mg daily  - Continue carvedilol 12.5 mg BID, furosemide 20 mg daily and lisinopril 10 mg daily  - Basic disease state pathophysiology, medication indication, mechanism and side effects reviewed at length with patient and he verbalized understanding  2.CAD s/p CABG 2010 - On ASA, plavix, statin, BB - No s/s ischemia -CAD on recent cath - PCI with Dr. Excell [name] in the future - Referred to Dr. Myra [name] for carotid stenting  3. Hx  of Tobacco use - Quit smokingJanuary this year.Congratulated on continued cessation.  4. HTN - Elevated above goal <130/80 (although did not take am meds today) sowill start spironolactone since does not tolerate Entresto  5. CKD - Baseline ~1.2-1.4 - BMET stable  6. ?OSA -Discussed possible sleep study referralper MD  7. Alcohol abuse -  Down to 6 beers total last week and no moonshine -- from 4-12beers daily+ moonshine >> 18-24beers/day - Congratulated and encouraged to continue to decrease his ETOH intake  Plan: 1) Medication changes: Based on clinical presentation, vital signs and recent labs will start spironolactone 25 mg daily 2) Labs: BMET today and in 1 week  3) Follow-up: Dr. Gala [name] on 9/25   Randy [name]. Randy [name], PharmD, BCPS, CPP Clinical Pharmacist Pager: [phone] Phone: [phone] 02/10/2018 8:50 AM

## 2018-02-15 ENCOUNTER — Other Ambulatory Visit: Payer: Self-pay | Admitting: Family Medicine

## 2018-02-15 ENCOUNTER — Other Ambulatory Visit: Payer: Self-pay | Admitting: Student in an Organized Health Care Education/Training Program

## 2018-02-15 DIAGNOSIS — I5020 Unspecified systolic (congestive) heart failure: Secondary | ICD-10-CM

## 2018-02-17 ENCOUNTER — Telehealth: Payer: Self-pay | Admitting: *Deleted

## 2018-02-17 ENCOUNTER — Other Ambulatory Visit (HOSPITAL_COMMUNITY): Payer: Medicaid Other

## 2018-02-17 ENCOUNTER — Other Ambulatory Visit: Payer: Self-pay | Admitting: Physician Assistant

## 2018-02-17 DIAGNOSIS — R748 Abnormal levels of other serum enzymes: Secondary | ICD-10-CM

## 2018-02-17 DIAGNOSIS — E785 Hyperlipidemia, unspecified: Secondary | ICD-10-CM

## 2018-02-17 DIAGNOSIS — I6523 Occlusion and stenosis of bilateral carotid arteries: Secondary | ICD-10-CM

## 2018-02-17 DIAGNOSIS — I2510 Atherosclerotic heart disease of native coronary artery without angina pectoris: Secondary | ICD-10-CM

## 2018-02-17 NOTE — Telephone Encounter (Signed)
-----   Message from Laurann Montana, [place] sent at 02/17/2018  7:50 AM EDT ----- I have not yet heard back, but please call pt to let him know liver function testing was somewhat abnormal. Dr. [name] did review plan with Dr. [name] regarding further plans for cath and they both agree the patient should have a repeat carotid duplex scan (bilaterally) and f/u with [facility] next month as planned before moving forward with cath. Please arrange duplex. At that time would also repeat LFTs. Thanks. If LFTs are still abnormal we will need to think about referring to GI to evaluate for cirrhosis given his chronic alcohol abuse. Dayna Dunn PA-C

## 2018-02-24 ENCOUNTER — Ambulatory Visit (HOSPITAL_COMMUNITY)
Admission: RE | Admit: 2018-02-24 | Disposition: A | Discharge status: Home / Self Care | Payer: Medicaid Other | Source: Ambulatory Visit | Attending: Physician Assistant | Admitting: Physician Assistant

## 2018-02-25 ENCOUNTER — Telehealth: Payer: Self-pay | Admitting: Physician Assistant

## 2018-02-25 NOTE — Telephone Encounter (Signed)
Just FYI that I received the following staff message below about Mr. Weidmann.  Barrett, Tonette Lederer, RVT  Laurann Montana, PA-C        Hello!   Your patient was a no show for his scheduled carotid ultrasound today.   Thanks,   Joellen [place] Kinsey Cowsert PA-C

## 2018-03-04 ENCOUNTER — Encounter (HOSPITAL_COMMUNITY): Payer: Self-pay | Admitting: Cardiovascular Disease

## 2018-03-04 ENCOUNTER — Ambulatory Visit (HOSPITAL_COMMUNITY)
Admission: RE | Admit: 2018-03-04 | Payer: Medicaid Other | Source: Ambulatory Visit | Attending: Physician Assistant | Admitting: Physician Assistant

## 2018-03-24 ENCOUNTER — Encounter (HOSPITAL_COMMUNITY): Payer: Medicaid Other | Admitting: Internal Medicine

## 2018-03-30 ENCOUNTER — Ambulatory Visit: Payer: Medicaid Other | Admitting: Family Medicine

## 2018-05-11 ENCOUNTER — Other Ambulatory Visit: Payer: Self-pay

## 2018-05-11 DIAGNOSIS — I5020 Unspecified systolic (congestive) heart failure: Secondary | ICD-10-CM

## 2018-05-12 ENCOUNTER — Encounter (HOSPITAL_COMMUNITY): Payer: Self-pay | Admitting: Emergency Medicine

## 2018-05-12 ENCOUNTER — Other Ambulatory Visit: Payer: Self-pay

## 2018-05-12 ENCOUNTER — Emergency Department (HOSPITAL_COMMUNITY): Payer: Medicaid Other

## 2018-05-12 ENCOUNTER — Observation Stay (HOSPITAL_BASED_OUTPATIENT_CLINIC_OR_DEPARTMENT_OTHER): Payer: Medicaid Other

## 2018-05-12 ENCOUNTER — Inpatient Hospital Stay (HOSPITAL_COMMUNITY)
Admission: EM | Admit: 2018-05-12 | Discharge: 2018-05-21 | DRG: 246 | Disposition: A | Discharge status: Home Health | Payer: Medicaid Other | Attending: Cardiology | Admitting: Cardiology

## 2018-05-12 ENCOUNTER — Observation Stay (HOSPITAL_COMMUNITY): Payer: Medicaid Other

## 2018-05-12 DIAGNOSIS — Z7982 Long term (current) use of aspirin: Secondary | ICD-10-CM

## 2018-05-12 DIAGNOSIS — N179 Acute kidney failure, unspecified: Secondary | ICD-10-CM | POA: Diagnosis not present

## 2018-05-12 DIAGNOSIS — I6521 Occlusion and stenosis of right carotid artery: Secondary | ICD-10-CM | POA: Insufficient documentation

## 2018-05-12 DIAGNOSIS — R7989 Other specified abnormal findings of blood chemistry: Secondary | ICD-10-CM

## 2018-05-12 DIAGNOSIS — I63019 Cerebral infarction due to thrombosis of unspecified vertebral artery: Secondary | ICD-10-CM

## 2018-05-12 DIAGNOSIS — I130 Hypertensive heart and chronic kidney disease with heart failure and stage 1 through stage 4 chronic kidney disease, or unspecified chronic kidney disease: Secondary | ICD-10-CM | POA: Diagnosis present

## 2018-05-12 DIAGNOSIS — Z7951 Long term (current) use of inhaled steroids: Secondary | ICD-10-CM

## 2018-05-12 DIAGNOSIS — G4733 Obstructive sleep apnea (adult) (pediatric): Secondary | ICD-10-CM | POA: Diagnosis present

## 2018-05-12 DIAGNOSIS — I110 Hypertensive heart disease with heart failure: Secondary | ICD-10-CM | POA: Diagnosis not present

## 2018-05-12 DIAGNOSIS — F1010 Alcohol abuse, uncomplicated: Secondary | ICD-10-CM | POA: Diagnosis present

## 2018-05-12 DIAGNOSIS — I5023 Acute on chronic systolic (congestive) heart failure: Secondary | ICD-10-CM | POA: Insufficient documentation

## 2018-05-12 DIAGNOSIS — I5020 Unspecified systolic (congestive) heart failure: Secondary | ICD-10-CM

## 2018-05-12 DIAGNOSIS — Z951 Presence of aortocoronary bypass graft: Secondary | ICD-10-CM

## 2018-05-12 DIAGNOSIS — I509 Heart failure, unspecified: Secondary | ICD-10-CM | POA: Diagnosis not present

## 2018-05-12 DIAGNOSIS — I739 Peripheral vascular disease, unspecified: Secondary | ICD-10-CM | POA: Diagnosis present

## 2018-05-12 DIAGNOSIS — Z79899 Other long term (current) drug therapy: Secondary | ICD-10-CM

## 2018-05-12 DIAGNOSIS — I214 Non-ST elevation (NSTEMI) myocardial infarction: Secondary | ICD-10-CM

## 2018-05-12 DIAGNOSIS — N189 Chronic kidney disease, unspecified: Secondary | ICD-10-CM | POA: Diagnosis not present

## 2018-05-12 DIAGNOSIS — R05 Cough: Secondary | ICD-10-CM | POA: Diagnosis not present

## 2018-05-12 DIAGNOSIS — K219 Gastro-esophageal reflux disease without esophagitis: Secondary | ICD-10-CM | POA: Diagnosis present

## 2018-05-12 DIAGNOSIS — E785 Hyperlipidemia, unspecified: Secondary | ICD-10-CM | POA: Diagnosis present

## 2018-05-12 DIAGNOSIS — Z955 Presence of coronary angioplasty implant and graft: Secondary | ICD-10-CM

## 2018-05-12 DIAGNOSIS — I252 Old myocardial infarction: Secondary | ICD-10-CM

## 2018-05-12 DIAGNOSIS — N183 Chronic kidney disease, stage 3 (moderate): Secondary | ICD-10-CM | POA: Diagnosis present

## 2018-05-12 DIAGNOSIS — I5043 Acute on chronic combined systolic (congestive) and diastolic (congestive) heart failure: Secondary | ICD-10-CM | POA: Diagnosis present

## 2018-05-12 DIAGNOSIS — I248 Other forms of acute ischemic heart disease: Secondary | ICD-10-CM | POA: Diagnosis present

## 2018-05-12 DIAGNOSIS — Z888 Allergy status to other drugs, medicaments and biological substances status: Secondary | ICD-10-CM

## 2018-05-12 DIAGNOSIS — R778 Other specified abnormalities of plasma proteins: Secondary | ICD-10-CM

## 2018-05-12 DIAGNOSIS — R0602 Shortness of breath: Secondary | ICD-10-CM | POA: Diagnosis not present

## 2018-05-12 DIAGNOSIS — Z8673 Personal history of transient ischemic attack (TIA), and cerebral infarction without residual deficits: Secondary | ICD-10-CM

## 2018-05-12 DIAGNOSIS — Z7902 Long term (current) use of antithrombotics/antiplatelets: Secondary | ICD-10-CM

## 2018-05-12 DIAGNOSIS — F17210 Nicotine dependence, cigarettes, uncomplicated: Secondary | ICD-10-CM | POA: Diagnosis present

## 2018-05-12 DIAGNOSIS — I361 Nonrheumatic tricuspid (valve) insufficiency: Secondary | ICD-10-CM

## 2018-05-12 DIAGNOSIS — I714 Abdominal aortic aneurysm, without rupture: Secondary | ICD-10-CM | POA: Diagnosis present

## 2018-05-12 DIAGNOSIS — R0609 Other forms of dyspnea: Secondary | ICD-10-CM

## 2018-05-12 DIAGNOSIS — I25810 Atherosclerosis of coronary artery bypass graft(s) without angina pectoris: Secondary | ICD-10-CM | POA: Diagnosis present

## 2018-05-12 HISTORY — DX: Non-ST elevation (NSTEMI) myocardial infarction: I21.4

## 2018-05-12 LAB — BASIC METABOLIC PANEL
ANION GAP: 12 (ref 5–15)
BUN: 27 mg/dL — AB (ref 6–20)
CHLORIDE: 102 mmol/L (ref 98–111)
CO2: 20 mmol/L — AB (ref 22–32)
CREATININE: 1.9 mg/dL — AB (ref 0.61–1.24)
Calcium: 9.1 mg/dL (ref 8.9–10.3)
GFR calc non Af Amer: 37 mL/min — ABNORMAL LOW (ref >60)
GFR, EST AFRICAN AMERICAN: 43 mL/min — AB (ref >60)
GLUCOSE: 141 mg/dL — AB (ref 70–99)
Potassium: 5 mmol/L (ref 3.5–5.1)
Sodium: 134 mmol/L — ABNORMAL LOW (ref 135–145)

## 2018-05-12 LAB — ECHOCARDIOGRAM COMPLETE
HEIGHTINCHES: 71 in
Weight: 3040 oz

## 2018-05-12 LAB — TROPONIN I
TROPONIN I: 4.1 ng/mL — AB (ref <0.03)
Troponin I: 5.18 ng/mL (ref <0.03)

## 2018-05-12 LAB — HEPATIC FUNCTION PANEL
ALBUMIN: 3.2 g/dL — AB (ref 3.5–5.0)
ALK PHOS: 110 U/L (ref 38–126)
ALT: 36 U/L (ref 0–44)
AST: 46 U/L — AB (ref 15–41)
BILIRUBIN TOTAL: 1.4 mg/dL — AB (ref 0.3–1.2)
Bilirubin, Direct: 0.3 mg/dL — ABNORMAL HIGH (ref 0.0–0.2)
Indirect Bilirubin: 1.1 mg/dL — ABNORMAL HIGH (ref 0.3–0.9)
Total Protein: 5.8 g/dL — ABNORMAL LOW (ref 6.5–8.1)

## 2018-05-12 LAB — CBC
HEMATOCRIT: 46.5 % (ref 39.0–52.0)
HEMOGLOBIN: 14.5 g/dL (ref 13.0–17.0)
MCH: 27.4 pg (ref 26.0–34.0)
MCHC: 31.2 g/dL (ref 30.0–36.0)
MCV: 87.9 fL (ref 80.0–100.0)
Platelets: 259 10*3/uL (ref 150–400)
RBC: 5.29 MIL/uL (ref 4.22–5.81)
RDW: 16.4 % — AB (ref 11.5–15.5)
WBC: 9.6 10*3/uL (ref 4.0–10.5)
nRBC: 0 % (ref 0.0–0.2)

## 2018-05-12 LAB — HEMOGLOBIN A1C
Hgb A1c MFr Bld: 5.8 % — ABNORMAL HIGH (ref 4.8–5.6)
Mean Plasma Glucose: 119.76 mg/dL

## 2018-05-12 LAB — I-STAT TROPONIN, ED: Troponin i, poc: 3.24 ng/mL (ref 0.00–0.08)

## 2018-05-12 LAB — D-DIMER, QUANTITATIVE: D-Dimer, Quant: 4.43 ug/mL-FEU — ABNORMAL HIGH (ref 0.00–0.50)

## 2018-05-12 LAB — HEPARIN LEVEL (UNFRACTIONATED): HEPARIN UNFRACTIONATED: 0.25 [IU]/mL — AB (ref 0.30–0.70)

## 2018-05-12 LAB — BRAIN NATRIURETIC PEPTIDE: B Natriuretic Peptide: 2066 pg/mL — ABNORMAL HIGH (ref 0.0–100.0)

## 2018-05-12 MED ORDER — CITALOPRAM HYDROBROMIDE 20 MG PO TABS
20.0000 mg | ORAL_TABLET | Freq: Every day | ORAL | Status: DC
  Administered 2018-05-12 – 2018-05-21 (×9): 20 mg via ORAL
  Filled 2018-05-12 (×9): qty 1

## 2018-05-12 MED ORDER — ASPIRIN 300 MG RE SUPP
300.0000 mg | RECTAL | Status: AC
  Filled 2018-05-12: qty 1

## 2018-05-12 MED ORDER — ONDANSETRON HCL 4 MG/2ML IJ SOLN
4.0000 mg | Freq: Four times a day (QID) | INTRAMUSCULAR | Status: DC | PRN
  Administered 2018-05-13: 4 mg via INTRAVENOUS
  Filled 2018-05-12: qty 2

## 2018-05-12 MED ORDER — FUROSEMIDE 10 MG/ML IJ SOLN
40.0000 mg | Freq: Two times a day (BID) | INTRAMUSCULAR | Status: DC
  Administered 2018-05-12 – 2018-05-13 (×2): 40 mg via INTRAVENOUS
  Filled 2018-05-12 (×2): qty 4

## 2018-05-12 MED ORDER — ASPIRIN EC 81 MG PO TBEC: 81.0000 mg | DELAYED_RELEASE_TABLET | Freq: Every day | ORAL | Status: DC

## 2018-05-12 MED ORDER — CLOPIDOGREL BISULFATE 75 MG PO TABS
75.0000 mg | ORAL_TABLET | Freq: Every day | ORAL | Status: DC
  Administered 2018-05-13 – 2018-05-21 (×9): 75 mg via ORAL
  Filled 2018-05-12 (×9): qty 1

## 2018-05-12 MED ORDER — EZETIMIBE 10 MG PO TABS: 10.0000 mg | ORAL_TABLET | Freq: Every day | ORAL | 3 refills | Status: DC

## 2018-05-12 MED ORDER — ATORVASTATIN CALCIUM 80 MG PO TABS
80.0000 mg | ORAL_TABLET | Freq: Every evening | ORAL | Status: DC
  Administered 2018-05-12 – 2018-05-20 (×9): 80 mg via ORAL
  Filled 2018-05-12 (×9): qty 1

## 2018-05-12 MED ORDER — ASPIRIN 81 MG PO CHEW
324.0000 mg | CHEWABLE_TABLET | ORAL | Status: AC
  Administered 2018-05-12: 324 mg via ORAL
  Filled 2018-05-12: qty 4

## 2018-05-12 MED ORDER — NITROGLYCERIN 0.4 MG SL SUBL: 0.4000 mg | SUBLINGUAL_TABLET | SUBLINGUAL | Status: DC | PRN

## 2018-05-12 MED ORDER — ASPIRIN EC 81 MG PO TBEC
81.0000 mg | DELAYED_RELEASE_TABLET | Freq: Every day | ORAL | Status: DC
  Administered 2018-05-13 – 2018-05-21 (×8): 81 mg via ORAL
  Filled 2018-05-12 (×8): qty 1

## 2018-05-12 MED ORDER — CITALOPRAM HYDROBROMIDE 20 MG PO TABS: 20.0000 mg | ORAL_TABLET | Freq: Every day | ORAL | 0 refills | Status: DC

## 2018-05-12 MED ORDER — ALBUTEROL SULFATE (2.5 MG/3ML) 0.083% IN NEBU: 2.5000 mg | INHALATION_SOLUTION | Freq: Four times a day (QID) | RESPIRATORY_TRACT | Status: DC | PRN

## 2018-05-12 MED ORDER — CARVEDILOL 12.5 MG PO TABS
12.5000 mg | ORAL_TABLET | Freq: Two times a day (BID) | ORAL | Status: DC
  Administered 2018-05-12 – 2018-05-13 (×2): 12.5 mg via ORAL
  Filled 2018-05-12 (×2): qty 1

## 2018-05-12 MED ORDER — TECHNETIUM TC 99M DIETHYLENETRIAME-PENTAACETIC ACID
30.0000 | Freq: Once | INTRAVENOUS | Status: AC | PRN
  Administered 2018-05-12: 30 via RESPIRATORY_TRACT

## 2018-05-12 MED ORDER — HEPARIN BOLUS VIA INFUSION
4000.0000 [IU] | Freq: Once | INTRAVENOUS | Status: AC
  Administered 2018-05-12: 4000 [IU] via INTRAVENOUS
  Filled 2018-05-12: qty 4000

## 2018-05-12 MED ORDER — EZETIMIBE 10 MG PO TABS
10.0000 mg | ORAL_TABLET | Freq: Every day | ORAL | Status: DC
  Administered 2018-05-12 – 2018-05-21 (×9): 10 mg via ORAL
  Filled 2018-05-12 (×9): qty 1

## 2018-05-12 MED ORDER — HEPARIN (PORCINE) 25000 UT/250ML-% IV SOLN
1500.0000 [IU]/h | INTRAVENOUS | Status: DC
  Administered 2018-05-12: 1100 [IU]/h via INTRAVENOUS
  Administered 2018-05-13: 1250 [IU]/h via INTRAVENOUS
  Filled 2018-05-12 (×2): qty 250

## 2018-05-12 MED ORDER — TECHNETIUM TO 99M ALBUMIN AGGREGATED
4.0000 | Freq: Once | INTRAVENOUS | Status: AC | PRN
  Administered 2018-05-12: 4 via INTRAVENOUS

## 2018-05-12 MED ORDER — ACETAMINOPHEN 325 MG PO TABS
650.0000 mg | ORAL_TABLET | ORAL | Status: DC | PRN
  Administered 2018-05-12 – 2018-05-13 (×4): 650 mg via ORAL
  Filled 2018-05-12 (×4): qty 2

## 2018-05-12 NOTE — Progress Notes (Signed)
ANTICOAGULATION CONSULT NOTE - Initial Consult  Pharmacy Consult for heparin Indication: chest pain/ACS  Allergies  Allergen Reactions  . Entresto [Sacubitril-Valsartan]     Dizziness, sick on stomach    Patient Measurements: Height: 5\' 11"  (180.3 cm) Weight: 190 lb (86.2 kg) IBW/kg (Calculated) : 75.3 Heparin Dosing Weight: 86.2kg  Vital Signs: Temp: 96.4 F (35.8 C) (11/13 1028) Temp Source: Axillary (11/13 1028) BP: 160/101 (11/13 1028) Pulse Rate: 89 (11/13 1028)  Labs: No results for input(s): HGB, HCT, PLT, APTT, LABPROT, INR, HEPARINUNFRC, HEPRLOWMOCWT, CREATININE, CKTOTAL, CKMB, TROPONINI in the last 72 hours.  CrCl cannot be calculated (Patient's most recent lab result is older than the maximum 21 days allowed.).   Medical History: Past Medical History:  Diagnosis Date  . AAA (abdominal aortic aneurysm) [facility]) July 2013  . Alcohol abuse   . Anxiety   . CAD (coronary artery disease)    a. s/p CABG 2010.  . Carotid artery occlusion    a. prior R CEA, stenting of L carotid 2014. b. Severe carotid restenosis in 2017 -> planned for surgery but pt did not f/u.  [name] Chronic combined systolic and diastolic CHF (congestive heart failure) (HCC)   . GERD (gastroesophageal reflux disease)   . Heart attack (HCC) 2010  . Hx of CABG 2010  . Hyperlipidemia   . Hypertension   . Lung nodule   . Polysubstance abuse (HCC)    a. mention of cocaine positivity in 2010 with patient stating unknowingly exposed, also EtOH.  [name] PVD (peripheral vascular disease) (HCC)    a. PAD s/p failed attempt at stenting of the left common iliac artery 2014.  . Stroke (HCC)   . Uncontrolled hypertension 02/02/2012    Assessment: Pt presenting with chest pain, SOB, & lower extremity swelling starting 3 days prior w/ possible ACS. Troponin 3.24. Pending CBC but previous CBC (12/2017) WNL.   Goal of Therapy:  Heparin level 0.3-0.7 units/ml Monitor platelets by anticoagulation protocol: Yes    Plan:  Give 4000 units bolus x 1 Start heparin infusion at 1100 units/hr Check anti-Xa level in 6 hours and daily while on heparin Continue to monitor H&H and platelets  Seth Friedlander 05/12/2018,10:57 AM

## 2018-05-12 NOTE — ED Notes (Signed)
Pt taken to room 3e21 after returning from VQ scan. Daughter waiting in room.

## 2018-05-12 NOTE — Progress Notes (Signed)
No new orders per [facility] Patient remains pain free

## 2018-05-12 NOTE — ED Notes (Signed)
Patient transported to X-ray 

## 2018-05-12 NOTE — Progress Notes (Signed)
  Echocardiogram 2D Echocardiogram has been performed.  Delcie Roch 05/12/2018, 5:06 PM

## 2018-05-12 NOTE — ED Provider Notes (Signed)
[facility] EMERGENCY DEPARTMENT Provider Note   CSN: [id number] Arrival date & time: 05/12/18  1022     History   Chief Complaint Chief Complaint  Patient presents with  . Shortness of Breath    HPI Randy [name] is a 57 y.o. male.  Randy [name] is a 57 y.o. Male with a history of CAD as/P CABG, CHF, AAA, hypertension, hyperlipidemia, PVD, stroke, polysubstance abuse, GERD, who presents to the emergency department for 3 days of progressively worsening shortness of breath.  Patient reports at first he just felt dyspneic when up moving around but now it has worsened and he is also short of breath at rest.  He denies any associated chest pain, no pain with deep breath.  No pain in the arm neck or jaw.  He has not had any lightheadedness or syncope.  He does report feeling more fatigued.  He also reports increasing swelling over bilateral lower extremities.  Reports he takes Lasix regularly, but has continued to have worsening swelling.  He denies any wheezing, cough, fevers or chills.  He is followed by Dr. Milas Kocher with cardiology. Last echo done July 2019 showed an EF of 30-35, last heart cath done in July as well, which showed multiple areas of stenosis.      Past Medical History:  Diagnosis Date  . AAA (abdominal aortic aneurysm) [facility]) July 2013  . Alcohol abuse   . Anxiety   . CAD (coronary artery disease)    a. s/p CABG 2010.  . Carotid artery occlusion    a. prior R CEA, stenting of L carotid 2014. b. Severe carotid restenosis in 2017 -> planned for surgery but pt did not f/u.  [name] Chronic combined systolic and diastolic CHF (congestive heart failure) (HCC)   . GERD (gastroesophageal reflux disease)   . Heart attack (HCC) 2010  . Hx of CABG 2010  . Hyperlipidemia   . Hypertension   . Lung nodule   . Polysubstance abuse (HCC)    a. mention of cocaine positivity in 2010 with patient stating unknowingly exposed, also EtOH.  [name] PVD (peripheral vascular  disease) (HCC)    a. PAD s/p failed attempt at stenting of the left common iliac artery 2014.  . Stroke (HCC)   . Uncontrolled hypertension 02/02/2012    Patient Active Problem List   Diagnosis Date Noted  . NSTEMI (non-ST elevated myocardial infarction) (HCC) 05/12/2018  . Acute on chronic systolic (congestive) heart failure (HCC)   . Carotid stenosis, right   . Acute kidney injury superimposed on CKD (HCC)   . History of tobacco abuse 10/23/2017  . Hyperkalemia 09/18/2017  . Systolic heart failure (HCC) 07/08/2017  . Acute congestive heart failure (HCC)   . Dyspnea 03/23/2017  . Cellulitis of right ankle 01/23/2017  . Muscle spasm 10/23/2016  . Insomnia 04/11/2016  . Orthopnea 03/31/2016  . 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 HTN (hypertension) 06/15/2014  . HLD (hyperlipidemia) 04/05/2014  . PVD (peripheral vascular disease) with claudication (HCC) 05/09/2013  . GERD (gastroesophageal reflux disease) 03/28/2013  . AAA (abdominal aortic aneurysm) without rupture (HCC) 10/28/2012  . Anxiety state 02/12/2012  . Occlusion and stenosis of carotid artery without mention of cerebral infarction 01/19/2012  . Pulmonary nodule 05/19/2011  . CAD, ARTERY BYPASS GRAFT 10/19/2008  . DISC DISEASE, LUMBAR 10/19/2008    Past Surgical History:  Procedure Laterality Date  . ABDOMINAL AORTAGRAM N/A 04/19/2013   Procedure: ABDOMINAL AORTAGRAM;  Surgeon: Nada Libman, MD;  Location: [facility] CATH LAB;  Service: Cardiovascular;  Laterality: N/A;  . CAROTID ENDARTERECTOMY Left 01-13-13   Attempted cea  . CAROTID STENT INSERTION Left 02/22/2013   Procedure: CAROTID STENT INSERTION;  Surgeon: Nada Libman, MD;  Location: [facility] CATH LAB;  Service: Cardiovascular;  Laterality: Left;  . CORONARY ARTERY BYPASS GRAFT  2010  . ENDARTERECTOMY Left 01/13/2013   Procedure: ATTEMPTED ENDARTERECTOMY CAROTID;  Surgeon: Nada Libman, MD;  Location: [facility] OR;  Service:  Vascular;  Laterality: Left;  . RIGHT/LEFT HEART CATH AND CORONARY/GRAFT ANGIOGRAPHY N/A 12/29/2017   Procedure: RIGHT/LEFT HEART CATH AND CORONARY/GRAFT ANGIOGRAPHY;  Surgeon: Dolores Patty, MD;  Location: [facility] INVASIVE CV LAB;  Service: Cardiovascular;  Laterality: N/A;        Home Medications    Prior to Admission medications   Medication Sig Start Date End Date Taking? Authorizing Provider  albuterol (PROVENTIL HFA;VENTOLIN HFA) 108 (90 Base) MCG/ACT inhaler Inhale 2 puffs into the lungs every 6 (six) hours as needed for wheezing or shortness of breath.   Yes [provider]  aspirin EC 81 MG tablet Take 1 tablet (81 mg total) by mouth daily. 03/31/16  Yes Howard Pouch, MD  atorvastatin (LIPITOR) 80 MG tablet Take 1 tablet (80 mg total) by mouth every evening. 02/01/18 05/12/18 Yes Dunn, Dayna N, PA-C  carvedilol (COREG) 12.5 MG tablet Take 1 tablet (12.5 mg total) by mouth 2 (two) times daily with a meal. 09/18/17  Yes Howard Pouch, MD  citalopram (CELEXA) 20 MG tablet Take 1 tablet (20 mg total) by mouth daily. 05/12/18  Yes Howard Pouch, MD  clopidogrel (PLAVIX) 75 MG tablet Take 1 tablet (75 mg total) by mouth daily with breakfast. 01/23/17  Yes Wendee Beavers, DO  ezetimibe (ZETIA) 10 MG tablet Take 1 tablet (10 mg total) by mouth daily. 05/12/18  Yes Howard Pouch, MD  furosemide (LASIX) 20 MG tablet TAKE 1 TABLET BY MOUTH ONCE DAILY 02/15/18  Yes Howard Pouch, MD  lisinopril (PRINIVIL,ZESTRIL) 10 MG tablet Take 1 tablet (10 mg total) by mouth daily. 02/01/18 05/12/18 Yes Dunn, Dayna N, PA-C  omeprazole (PRILOSEC OTC) 20 MG tablet Take 20 mg by mouth daily.   Yes [provider]  spironolactone (ALDACTONE) 25 MG tablet Take 1 tablet (25 mg total) by mouth daily. 02/10/18 05/11/18  Bensimhon, Bevelyn Buckles, MD    Family History Family History  Problem Relation Age of Onset  . Asthma Mother   . Diabetes Mother   . Hyperlipidemia Mother   . Hypertension Mother   . Lung  cancer Father   . Cancer Father   . Stroke Sister     Social History Social History   Tobacco Use  . Smoking status: Current Some Day Smoker    Packs/day: 0.50    Years: 30.00    Pack years: 15.00    Types: Cigarettes  . Smokeless tobacco: Never Used  Substance Use Topics  . Alcohol use: Yes    Alcohol/week: 5.0 standard drinks    Types: 5 Cans of beer per week    Comment: occasionally  . Drug use: Yes    Types: Marijuana     Allergies   Entresto [sacubitril-valsartan]   Review of Systems Review of Systems  Constitutional: Positive for fatigue. Negative for chills and fever.  HENT: Negative.   Eyes: Negative for visual disturbance.  Respiratory: Positive for shortness of breath. Negative for cough, chest tightness and wheezing.   Cardiovascular:  Positive for leg swelling. Negative for chest pain and palpitations.  Gastrointestinal: Negative for abdominal pain, diarrhea, nausea and vomiting.  Genitourinary: Negative for dysuria, frequency and hematuria.  Musculoskeletal: Negative for arthralgias and myalgias.  Skin: Negative for color change and rash.  Neurological: Negative for dizziness, syncope, light-headedness and headaches.  All other systems reviewed and are negative.    Physical Exam Updated Vital Signs Pulse 89   Resp (!) 22   Ht 5\' 11"  (1.803 m)   Wt 86.2 kg   SpO2 100%   BMI 26.50 kg/m   Physical Exam  Constitutional: He is oriented to person, place, and time. He appears well-developed and well-nourished. No distress.  HENT:  Head: Normocephalic and atraumatic.  Mouth/Throat: Oropharynx is clear and moist.  Eyes: Right eye exhibits no discharge. Left eye exhibits no discharge.  Neck: Normal range of motion. Neck supple. No JVD present. No tracheal deviation present.  Cardiovascular: Normal rate, regular rhythm, normal heart sounds and intact distal pulses. Exam reveals no gallop and no friction rub.  No murmur heard. Pulmonary/Chest: Effort  normal. No respiratory distress. He exhibits no tenderness.  Respirations equal and unlabored, patient able to speak in full sentences with mild tachypnea, lungs clear to auscultation bilaterally  Abdominal: Soft. Bowel sounds are normal. He exhibits no distension and no mass. There is no tenderness. There is no guarding.  Abdomen soft, nondistended, nontender to palpation in all quadrants without guarding or peritoneal signs  Musculoskeletal:       Right lower leg: He exhibits edema.       Left lower leg: He exhibits edema.  Bilateral lower extremities with 2+ pitting edema up to the mid shin  Neurological: He is alert and oriented to person, place, and time. Coordination normal.  Skin: Skin is warm and dry. Capillary refill takes less than 2 seconds. He is not diaphoretic.  Psychiatric: He has a normal mood and affect. His behavior is normal.  Nursing note and vitals reviewed.    ED Treatments / Results  Labs (all labs ordered are listed, but only abnormal results are displayed) Labs Reviewed  BASIC METABOLIC PANEL - Abnormal; Notable for the following components:      Result Value   Sodium 134 (*)    CO2 20 (*)    Glucose, Bld 141 (*)    BUN 27 (*)    Creatinine, Ser 1.90 (*)    GFR calc non Af Amer 37 (*)    GFR calc Af Amer 43 (*)    All other components within normal limits  CBC - Abnormal; Notable for the following components:   RDW 16.4 (*)    All other components within normal limits  D-DIMER, QUANTITATIVE (NOT AT [facility]) - Abnormal; Notable for the following components:   D-Dimer, Quant 4.43 (*)    All other components within normal limits  BRAIN NATRIURETIC PEPTIDE - Abnormal; Notable for the following components:   B Natriuretic Peptide 2,066.0 (*)    All other components within normal limits  I-STAT TROPONIN, ED - Abnormal; Notable for the following components:   Troponin i, poc 3.24 (*)    All other components within normal limits  HEPARIN LEVEL (UNFRACTIONATED)    HIV ANTIBODY (ROUTINE TESTING W REFLEX)  HEPATIC FUNCTION PANEL  TROPONIN I  TROPONIN I  TROPONIN I  HEMOGLOBIN A1C    EKG EKG Interpretation  Date/Time:  Wednesday May 12 2018 10:33:14 EST Ventricular Rate:  88 PR Interval:    QRS Duration:  108 QT Interval:  384 QTC Calculation: 465 R Axis:   93 Text Interpretation:  Sinus rhythm Borderline right axis deviation Nonspecific repol abnormality, diffuse leads No significant change since last tracing Baseline wander Confirmed by Shaune Pollack [id number]) on 05/12/2018 10:42:11 AM   Radiology Dg Chest 2 View  Result Date: 05/12/2018 CLINICAL DATA:  Shortness of breath and cough EXAM: CHEST - 2 VIEW COMPARISON:  03/31/2017 FINDINGS: Cardiac shadow is enlarged but stable. Postsurgical changes are noted. The lungs are well aerated bilaterally. No focal infiltrate or sizable effusion is seen. No acute bony abnormality is noted. IMPRESSION: Stable cardiomegaly.  No acute abnormality noted. Electronically Signed   By: Alcide Clever M.D.   On: 05/12/2018 11:41   Nm Pulmonary Perf And Vent  Result Date: 05/12/2018 CLINICAL DATA:  Shortness of breath.  Positive D-dimer study EXAM: NUCLEAR MEDICINE VENTILATION - PERFUSION LUNG SCAN VIEWS: Anterior, posterior, left lateral, right lateral, RPO, LPO, RAO, LAO-ventilation and perfusion RADIOPHARMACEUTICALS:  30.0 mCi of Tc-29m DTPA aerosol inhalation and 4.0 mCi Tc76m MAA IV COMPARISON:  Chest radiograph May 12, 2018 FINDINGS: Ventilation: Radiotracer uptake is homogeneous and symmetric bilaterally. No ventilation defects are evident. Perfusion: Radiotracer uptake is homogeneous and symmetric bilaterally. No perfusion defects are evident. Cardiomegaly noted. IMPRESSION: No appreciable ventilation or perfusion defects. This study constitutes a very low probability of pulmonary embolus. Cardiomegaly noted. Electronically Signed   By: Bretta Bang III M.D.   On: 05/12/2018 15:44     Procedures .Critical Care Performed by: Dartha Lodge, PA-C Authorized by: Dartha Lodge, PA-C   Critical care provider statement:    Critical care time (minutes):  45   Critical care was necessary to treat or prevent imminent or life-threatening deterioration of the following conditions:  Cardiac failure and circulatory failure   Critical care was time spent personally by me on the following activities:  Discussions with consultants, evaluation of patient's response to treatment, examination of patient, ordering and performing treatments and interventions, ordering and review of laboratory studies, ordering and review of radiographic studies, pulse oximetry, re-evaluation of patient's condition, obtaining history from patient or surrogate and review of old charts   I assumed direction of critical care for this patient from another provider in my specialty: no     (including critical care time)  Medications Ordered in ED Medications  heparin ADULT infusion 100 units/mL (25000 units/254mL sodium chloride 0.45%) (1,100 Units/hr Intravenous New Bag/Given 05/12/18 1153)  aspirin EC tablet 81 mg (has no administration in time range)  nitroGLYCERIN (NITROSTAT) SL tablet 0.4 mg (has no administration in time range)  acetaminophen (TYLENOL) tablet 650 mg (has no administration in time range)  heparin bolus via infusion 4,000 Units (4,000 Units Intravenous Bolus from Bag 05/12/18 1152)  technetium TC 50M diethylenetriame-pentaacetic acid (DTPA) injection 30 millicurie (30 millicuries Inhalation Given 05/12/18 1500)     Initial Impression / Assessment and Plan / ED Course  I have reviewed the triage vital signs and the nursing notes.  Pertinent labs & imaging results that were available during my care of the patient were reviewed by me and considered in my medical decision making (see chart for details).  Patient presents to the emergency department for 3 days of worsening dyspnea,  initially present primarily with exertion, now present at rest as well, significant cardiac history and history of CHF.  Patient also endorses worsening lower extremity swelling, has been compliant with Lasix.  On arrival patient is hypertensive mildly tachypneic but vitals otherwise  normal.  Lungs clear to auscultation, 2+ edema in bilateral legs.  Concerning for CHF exacerbation, patient showed extensive coronary artery disease on cath 5 months ago, could be contributing to patient's symptoms, could also be PE, lungs are clear and do not show any signs of wheezing to suggest COPD or pneumonia.  Will get troponin, EKG, chest x-ray, BNP, d-dimer, and basic labs.   EKG without any ST elevations or depressions, no significant change when compared to previous  10:40 AM notified by lab of elevated troponin of 3.24, will start patient on heparin drip and consult cardiology, will also give 1 dose of nitroglycerin to help with shortness of breath and hypertension, patient continues to deny any chest pain.  Case discussed with Trish from cardiology who will have cardiology team see and evaluate the patient, for likely admission.  Lab evaluation shows no evidence of leukocytosis, normal hemoglobin, creatinine is elevated at 1.90, no acute electrolyte derangements requiring intervention.  BNP is elevated at 2066, d-dimer is also elevated at 4.43.  We will hold off on ordering CTPE study given elevated creatinine, especially since patient is already heparinized for ACS, and this would not change management in the moment, awaiting cardiology recommendations because if patient needs cath, would not want to give additional contrast.  Cardiology has seen and evaluated patient and plans to admit, will plan to trend troponins, will plan to diurese with IV Lasix, patient may need heart cath, V/Q study ordered to evaluate for PE.  Patient discussed with Dr. Erma Heritage, who saw patient as well and agrees with plan.  Final  Clinical Impressions(s) / ED Diagnoses   Final diagnoses:  Elevated troponin  Acute on chronic congestive heart failure, unspecified heart failure type [facility])  Dyspnea on exertion    ED Discharge Orders    None       Legrand Rams 05/12/18 1632    Shaune Pollack, MD 05/14/18 2004

## 2018-05-12 NOTE — ED Notes (Signed)
Patient transported to nuclear medicine 

## 2018-05-12 NOTE — Progress Notes (Signed)
Discussed with Dr. [place], aware of the elevated trop and drop in EF. Patient has no chest pain. Will continue to monitor.   Ramond Dial PA Pager: [phone]

## 2018-05-12 NOTE — ED Triage Notes (Signed)
Pt arrives with shortness of breath and lower extremity swelling approx 3 days ago, progressively worse with exertion. Reports extensive cardiac hx.

## 2018-05-12 NOTE — Progress Notes (Signed)
CRITICAL VALUE ALERT  Critical value received:  Troponin 5.18  Date of notification:  11/13  Time of notification:  5:50 pm  Critical value read back:Yes.    Nurse who received alert:  Bobbette Eakes  MD notified (1st page):  [facility]  Time of first page:  5:56 PM   Patient on heparin, denies chest pain 0/10.

## 2018-05-12 NOTE — H&P (Addendum)
History & Physical    Patient ID: Randy [name] MRN: [id number], DOB/AGE: 03/18/1961   Admit date: 05/12/2018  Primary Physician: Howard Pouch, MD Primary Cardiologist: Dr. Nicholes Mango, MD  Patient Profile   Randy [name] is a 57 y.o. male with history of chronic combined HF (diagnosed 03/2017), HTN, HLD, CAD (s/p CABG 2010), alcohol abuse, ongoing tobacco use, prior cocaine exposure 2010 (pt states occurred unknowingly), remote prior R CEA, stenting of L carotid 2014, PAD s/p failed attempt at stenting of the left common iliac artery 2014, CKD III, CVA, and 4.3cm AAA (2017) who presented to [facility] with c/o SOB and LE swelling found to have an elevated troponin.    Past Medical History   Past Medical History:  Diagnosis Date  . AAA (abdominal aortic aneurysm) [facility]) July 2013  . Alcohol abuse   . Anxiety   . CAD (coronary artery disease)    a. s/p CABG 2010.  . Carotid artery occlusion    a. prior R CEA, stenting of L carotid 2014. b. Severe carotid restenosis in 2017 -> planned for surgery but pt did not f/u.  [name] Chronic combined systolic and diastolic CHF (congestive heart failure) (HCC)   . GERD (gastroesophageal reflux disease)   . Heart attack (HCC) 2010  . Hx of CABG 2010  . Hyperlipidemia   . Hypertension   . Lung nodule   . Polysubstance abuse (HCC)    a. mention of cocaine positivity in 2010 with patient stating unknowingly exposed, also EtOH.  [name] PVD (peripheral vascular disease) (HCC)    a. PAD s/p failed attempt at stenting of the left common iliac artery 2014.  . Stroke (HCC)   . Uncontrolled hypertension 02/02/2012    Past Surgical History:  Procedure Laterality Date  . ABDOMINAL AORTAGRAM N/A 04/19/2013   Procedure: ABDOMINAL AORTAGRAM;  Surgeon: Nada Libman, MD;  Location: [facility] CATH LAB;  Service: Cardiovascular;  Laterality: N/A;  . CAROTID ENDARTERECTOMY Left 01-13-13   Attempted cea  . CAROTID STENT INSERTION Left 02/22/2013   Procedure: CAROTID STENT  INSERTION;  Surgeon: Nada Libman, MD;  Location: [facility] CATH LAB;  Service: Cardiovascular;  Laterality: Left;  . CORONARY ARTERY BYPASS GRAFT  2010  . ENDARTERECTOMY Left 01/13/2013   Procedure: ATTEMPTED ENDARTERECTOMY CAROTID;  Surgeon: Nada Libman, MD;  Location: [facility] OR;  Service: Vascular;  Laterality: Left;  . RIGHT/LEFT HEART CATH AND CORONARY/GRAFT ANGIOGRAPHY N/A 12/29/2017   Procedure: RIGHT/LEFT HEART CATH AND CORONARY/GRAFT ANGIOGRAPHY;  Surgeon: Dolores Patty, MD;  Location: [facility] INVASIVE CV LAB;  Service: Cardiovascular;  Laterality: N/A;    Allergies  Allergies  Allergen Reactions  . Entresto [Sacubitril-Valsartan]     Dizziness, sick on stomach   History of Present Illness    Randy [name] is a 57 yo M with a hx as stated above who presented to [facility] on 05/12/18 with c/o of SOB and LE swelling for the last several weeks. He states that approximately 2 weeks ago he sustained a near syncopal event in which he became dizziness and diaphoretic, however denied the presence of chest pain. He states that he went home and felt "poor" for several days after that. He then noticed that he was more SOB with exertion and had BLE swelling. He continues to deny chest pain, palpitations, orthopnea symptoms, recent illness including fever, chills, N/V. He denies recent long distance air or car travel. He denies chest pain with deep inspiration. He has chronic claudication symptoms for which  he has been followed by [facility]. He reports that he continues to smoke tobacco and marijuana but has cut back in the last several months. He continues to drink alcohol, but has cut back on this too. He reports that he has taken himself off of some of his medications, however history is difficult as he is fairly unaware of what meds these are.    In the ED, EKG showed NSR with baseline wonder and no acute changes. There is evidence of non-specific T wave changes, similar to prior tracings. Troponin found to be elevated  at 3.24 without chest pain. His creatinine is elevated at 1.90 and d-dimer is noted to be 4.43. CXR with no acute abnormality. Given elevated d-dimer, will need CTA to r/o PE and other acute changes.     Of note, he was admitted on 03/2017 with CHF exacerbation. Echo showed EF 30-35% with grade 2 DD (55% in 2017 for comparison). Cardiology was consulted and he refused [facility] for further work up. More recently he established care with Dr. [name] for CHF evaluation. He underwent R & LHC 12/29/17 which showed severe native CAD, patent LIMA-LAD, occluded SVG-RCA, SVG-OM2 with 95% prox lesion, EF 25%, well compensated hemodynamics. Dr. Prescott Gum cath note reports that he suspects EF is down mainly due to ETOH but does have unprotected AV-groove LCX which feeds colalterals to distal RCA and would consider PCI of distal left main. A repeat echocardiogram was performed on 01/20/18 which showed an EF 30-35%, grade 2 DD, severely dilated LA, mildly dilated RV with mildly reduced RV function, moderate TR, elevated CVP. He was noted to have been followed by CHF pharmacist clinic for med titration and had not tolerated Entresto. PCI was planned for 8/1 but pt called and cancelled.   His last carotid study from 2017 showed an RICA 80-99%, LICA ~50%, left vertebral artery is patent with retrograde flow, suggestive of possible proximal obstruction. Dr. Myra Gianotti recommended angiography and stenting with distal embolic protection of the recurrent stenosis in the right common carotid artery. Unfortunately, this was scheduled and then later cancelled per pt and never followed up with. He states that he started feeling better and never felt that he needed to see anyone for further evaluation. The case was later discussed by Dr. Excell Seltzer, Dr. Jones Broom and [name], PA who felt that carotid disease would need to be taken care of prior to PCI of distal LM.   He was last seen in follow up on 02/01/18 and has not been seen since this  time by either service. Overall, pt was doing well per chart review. He had unchanged orthopnea and DOE without c/o chest pain. At that time, he had completely stopped his Entresto and was not willing to restart secondary to feelings of dizziness. He was also noted to not be taking his statin.   Home Medications    Prior to Admission medications   Medication Sig Start Date End Date Taking? Authorizing Provider  albuterol (PROVENTIL HFA;VENTOLIN HFA) 108 (90 Base) MCG/ACT inhaler Inhale 2 puffs into the lungs every 6 (six) hours as needed for wheezing or shortness of breath.   Yes [provider]  aspirin EC 81 MG tablet Take 1 tablet (81 mg total) by mouth daily. 03/31/16  Yes Howard Pouch, MD  atorvastatin (LIPITOR) 80 MG tablet Take 1 tablet (80 mg total) by mouth every evening. 02/01/18 05/12/18 Yes Dunn, Dayna N, PA-C  carvedilol (COREG) 12.5 MG tablet Take 1 tablet (12.5 mg total) by mouth  2 (two) times daily with a meal. 09/18/17  Yes Howard Pouch, MD  citalopram (CELEXA) 20 MG tablet Take 1 tablet (20 mg total) by mouth daily. 05/12/18  Yes Howard Pouch, MD  clopidogrel (PLAVIX) 75 MG tablet Take 1 tablet (75 mg total) by mouth daily with breakfast. 01/23/17  Yes Wendee Beavers, DO  ezetimibe (ZETIA) 10 MG tablet Take 1 tablet (10 mg total) by mouth daily. 05/12/18  Yes Howard Pouch, MD  furosemide (LASIX) 20 MG tablet TAKE 1 TABLET BY MOUTH ONCE DAILY 02/15/18  Yes Howard Pouch, MD  lisinopril (PRINIVIL,ZESTRIL) 10 MG tablet Take 1 tablet (10 mg total) by mouth daily. 02/01/18 05/12/18 Yes Dunn, Dayna N, PA-C  omeprazole (PRILOSEC OTC) 20 MG tablet Take 20 mg by mouth daily.   Yes [provider]  spironolactone (ALDACTONE) 25 MG tablet Take 1 tablet (25 mg total) by mouth daily. 02/10/18 05/11/18  Bensimhon, Bevelyn Buckles, MD   Family History    Family History  Problem Relation Age of Onset  . Asthma Mother   . Diabetes Mother   . Hyperlipidemia Mother   . Hypertension Mother    . Lung cancer Father   . Cancer Father   . Stroke Sister    Social History    Social History   Socioeconomic History  . Marital status: Widowed    Spouse name: Not on file  . Number of children: 1  . Years of education: 9th  . Highest education level: Not on file  Occupational History  . Occupation: not working.   Social Needs  . Financial resource strain: Not on file  . Food insecurity:    Worry: Not on file    Inability: Not on file  . Transportation needs:    Medical: Not on file    Non-medical: Not on file  Tobacco Use  . Smoking status: Current Some Day Smoker    Packs/day: 0.50    Years: 30.00    Pack years: 15.00    Types: Cigarettes    Last attempt to quit: 01/28/2017    Years since quitting: 1.2  . Smokeless tobacco: Never Used  Substance and Sexual Activity  . Alcohol use: Yes    Alcohol/week: 5.0 standard drinks    Types: 5 Cans of beer per week    Comment: occasionally  . Drug use: Yes    Types: Marijuana  . Sexual activity: Yes  Lifestyle  . Physical activity:    Days per week: Not on file    Minutes per session: Not on file  . Stress: Not on file  Relationships  . Social connections:    Talks on phone: Not on file    Gets together: Not on file    Attends religious service: Not on file    Active member of club or organization: Not on file    Attends meetings of clubs or organizations: Not on file    Relationship status: Not on file  . Intimate partner violence:    Fear of current or ex partner: Not on file    Emotionally abused: Not on file    Physically abused: Not on file    Forced sexual activity: Not on file  Other Topics Concern  . Not on file  Social History Narrative   Pt is widowed and lives alone.   Patient is right handed   Patient drinks about 2cups of caffeine daily.       Review of Systems   See HPI  above   All other systems reviewed and are otherwise negative except as noted above.  Physical Exam    Blood pressure  (!) 160/101, pulse 89, temperature (!) 96.4 F (35.8 C), temperature source Axillary, resp. rate (!) 22, height 5\' 11"  (1.803 m), weight 86.2 kg, SpO2 100 %.   General: Older than stated age,  NAD Skin: Warm, dry, intact  Head: Normocephalic, atraumatic, clear, moist mucus membranes. Neck: Negative for carotid bruits. No JVD Lungs:Clear to ausculation bilaterally. No wheezes, rales, or rhonchi. Breathing is unlabored. Cardiovascular: RRR with S1 S2. No murmurs, rubs, gallops, or LV heave appreciated. Abdomen: Soft, non-tender, non-distended with normoactive bowel sounds. No obvious abdominal masses. MSK: Strength and tone appear normal for age. 5/5 in all extremities Extremities: No edema. No clubbing or cyanosis. DP/PT pulses 1+ bilaterally Neuro: Alert and oriented. No focal deficits. No facial asymmetry. MAE spontaneously. Psych: Responds to questions appropriately with normal affect.    Labs    Troponin [facility] of Care Test) Recent Labs    05/12/18 1040  TROPIPOC 3.24*   No results for input(s): CKTOTAL, CKMB, TROPONINI in the last 72 hours. Lab Results  Component Value Date   WBC 9.6 05/12/2018   HGB 14.5 05/12/2018   HCT 46.5 05/12/2018   MCV 87.9 05/12/2018   PLT 259 05/12/2018    Recent Labs  Lab 05/12/18 1035  NA 134*  K 5.0  CL 102  CO2 20*  BUN 27*  CREATININE 1.90*  CALCIUM 9.1  GLUCOSE 141*   Lab Results  Component Value Date   CHOL 179 02/01/2018   HDL 45 02/01/2018   LDLCALC 115 (H) 02/01/2018   TRIG 97 02/01/2018   Lab Results  Component Value Date   DDIMER 4.43 (H) 05/12/2018   Radiology Studies    Dg Chest 2 View  Result Date: 05/12/2018 CLINICAL DATA:  Shortness of breath and cough EXAM: CHEST - 2 VIEW COMPARISON:  03/31/2017 FINDINGS: Cardiac shadow is enlarged but stable. Postsurgical changes are noted. The lungs are well aerated bilaterally. No focal infiltrate or sizable effusion is seen. No acute bony abnormality is noted. IMPRESSION:  Stable cardiomegaly.  No acute abnormality noted. Electronically Signed   By: Alcide Clever M.D.   On: 05/12/2018 11:41   ECG & Cardiac Imaging    Cardiac catheterization 12/29/2017:   Ost RCA to Prox RCA lesion is 100% stenosed.  Mid LM lesion is 80% stenosed.  Prox LAD lesion is 100% stenosed.  Ost 2nd Mrg to 2nd Mrg lesion is 100% stenosed.  2nd Mrg lesion is 99% stenosed.  Origin to Prox Graft lesion is 100% stenosed.  Prox Cx to Mid Cx lesion is 30% stenosed.  Mid Graft lesion is 30% stenosed.   Findings:  RA = 4 RV = 22/5 PA = 27/10 (16) PCW = 15 Fick cardiac output/index = 5.1/2.5 FA sat = 94% PA sat = 66%, 66%  Assessment: 1. Severe native CAD as above 2. LIMA to LAD patent 3. SVG to RCA occluded 4. SVG to OM-2 with 95% proximal lesion but no significant runoff vessel 5. EF 25% 6. Well compensated hemodynamics  Plan/Discussion:  Suspect EF is down mainly due to ETOH but does have unprotected AV-groove LCX which feeds colalterals to distal RCA. Would consider PCI of distal left main.   Echocardiogram 01/20/18: Study Conclusions  - Left ventricle: The cavity size was normal. Wall thickness was   increased in a pattern of mild LVH. Systolic function was  moderately to severely reduced. The estimated ejection fraction   was in the range of 30% to 35%. Diffuse hypokinesis with inferior   akinesis. Doppler parameters are consistent with pseudonormal   (grade 2 diastolic dysfunction). LV filling pressure is elevated. - Aortic valve: Trileaflet. Sclerosis without stenosis. - Mitral valve: Mildly thickened leaflets . There was trivial   regurgitation. - Left atrium: Severely dilated. Severely dilated. - Right ventricle: The cavity size was mildly dilated. Mildly   reduced systolic function. - Right atrium: Severely dilated. - Tricuspid valve: There was moderate regurgitation. - Pulmonary arteries: PA peak pressure: 43 mm Hg (S). - Inferior vena cava:  The vessel was dilated. The respirophasic   diameter changes were blunted (< 50%), consistent with elevated   central venous pressure.  Impressions:  - Compared with a prior study in 2018, the LVEF remains unchanged.  Assessment & Plan   1. NSTEMI with known hx of CAD s/p CABG: -Pt presented to worsening SOB over the last several weeks. He has a long and complicated cardiac hx with most recent cath in 12/2017 which showed severe native CAD with occluded SVG to RCA and SVG to OM-2 with 95% proximal lesion with consideration for PCI of distal LM. This was scheduled and never completed secondary to pt cancellation. He has known carotid artery disease and has followed with Dr. Myra Gianotti who recommended angiography and stenting with distal embolic protection of the recurrent stenosis in the right common carotid artery which was never completed.  -Trop elevated at 3.24>>will continue to trend -BNP at 2066 with s/s of acute HF exacerbation with SOB and LE swelling -D-dimer found to be 4.43>>will obtain VQ scan given renal dysfunction  -EKG abnormal however no significant change from prior tracing -Continue ASA, atorvastatin, carvedilol, Plavix -Will add IV lasix 40mg  twice daily for fluid volume overload and monitor with daily BMET -Continue Hep gtt per pharmacy -Will plan to diurese overnight, trend trop and follow tomorrow for more definitive evaluation plan  2. Chronic combined systolic and diastolic CHF: -Last echocardiogram 7/20019 with LVEF of 35% with diffuse hypokinesis with inferior akinesis and G2DD -Appears fluid volume overloaded today -Concern given recent dizziness episode and acute onset of SOB and LE swelling>>concern for further progression of CAD -Pt cannot tolerate Entresto and is unclear as to what medications he is and is not taking  -Will start IV Lasix 40mg  twice daily for fluid volume overload and hold diuretics and ACE until renal function improved -Obtain echocardiogram  for comparison  -Daily weight, strict I&O -Weight, 190lb today =>>was 184lb on 02/01/18 per office note  3. PAD: -Pt overdue for follow-up and never followed up for carotid disease with [facility].  -No show for LE vascular US  -Has c/o LE claudication   4. HTN: -Stable today,  -Will continue BB and hold ACE for now  5. HLD: -Statin restarted at last f/u visit in 01/2018 -Last LDL noted to be 115 on 02/01/18 with goal of <70 -Will need LFT's to assess given hx of ETOH  6. Tobacco and marijuana use: -Cessation strongly encouraged    Severity of Illness: The appropriate patient status for this patient is OBSERVATION. Observation status is judged to be reasonable and necessary in order to provide the required intensity of service to ensure the patient's safety. The patient's presenting symptoms, physical exam findings, and initial radiographic and laboratory data in the context of their medical condition is felt to place them at decreased risk for further clinical deterioration. Furthermore,  it is anticipated that the patient will be medically stable for discharge from the hospital within 2 midnights of admission. The following factors support the patient status of observation.   " The patient's presenting symptoms include SOB, LE swelling. " The physical exam findings include SOB, LE swelling " The initial radiographic and laboratory data are elevated BNP and Troponin .    Signed, Georgie Chard NP-C [facility] Pager: [phone] 05/12/2018, @NOW

## 2018-05-12 NOTE — Progress Notes (Addendum)
No change in orders. Patient denies chest pain 0/10.

## 2018-05-12 NOTE — Progress Notes (Signed)
ANTICOAGULATION CONSULT NOTE   Pharmacy Consult for heparin Indication: chest pain/ACS  Allergies  Allergen Reactions  . Entresto [Sacubitril-Valsartan]     Dizziness, sick on stomach    Patient Measurements: Height: 5\' 11"  (180.3 cm) Weight: 189 lb 5 oz (85.9 kg) IBW/kg (Calculated) : 75.3 Heparin Dosing Weight: 86.2kg  Vital Signs: Temp: 97.6 F (36.4 C) (11/13 1548) Temp Source: Oral (11/13 1548) BP: 149/101 (11/13 1548) Pulse Rate: 88 (11/13 1548)  Labs: Recent Labs    05/12/18 1035 05/12/18 1623  HGB 14.5  --   HCT 46.5  --   PLT 259  --   HEPARINUNFRC  --  0.25*  CREATININE 1.90*  --   TROPONINI  --  5.18*    Estimated Creatinine Clearance: 44.6 mL/min (A) (by C-G formula based on SCr of 1.9 mg/dL (H)).   Medical History: Past Medical History:  Diagnosis Date  . AAA (abdominal aortic aneurysm) [facility]) July 2013  . Alcohol abuse   . Anxiety   . CAD (coronary artery disease)    a. s/p CABG 2010.  . Carotid artery occlusion    a. prior R CEA, stenting of L carotid 2014. b. Severe carotid restenosis in 2017 -> planned for surgery but pt did not f/u.  [name] Chronic combined systolic and diastolic CHF (congestive heart failure) (HCC)   . GERD (gastroesophageal reflux disease)   . Heart attack (HCC) 2010  . Hx of CABG 2010  . Hyperlipidemia   . Hypertension   . Lung nodule   . Polysubstance abuse (HCC)    a. mention of cocaine positivity in 2010 with patient stating unknowingly exposed, also EtOH.  [name] PVD (peripheral vascular disease) (HCC)    a. PAD s/p failed attempt at stenting of the left common iliac artery 2014.  . Stroke (HCC)   . Uncontrolled hypertension 02/02/2012    Assessment: Pt presenting with chest pain, SOB, & lower extremity swelling starting 3 days prior w/ possible ACS. Troponin 3.24>5.18.  Heparin level came back slightly subtherapeutic at 0.25, on 1100 units/hr. Hgb 14.5, plt 259. No s/sx of bleeding. No infusion issues.   Goal of  Therapy:  Heparin level 0.3-0.7 units/ml Monitor platelets by anticoagulation protocol: Yes   Plan:  Increase heparin infusion to 1250 units/hr Check anti-Xa level daily while on heparin Continue to monitor H&H and platelets  Girard Cooter, PharmD Clinical Pharmacist  Pager: [phone] Phone: [phone] 05/12/2018,6:15 PM

## 2018-05-13 ENCOUNTER — Observation Stay: Payer: Self-pay

## 2018-05-13 DIAGNOSIS — Z955 Presence of coronary angioplasty implant and graft: Secondary | ICD-10-CM | POA: Diagnosis not present

## 2018-05-13 DIAGNOSIS — N189 Chronic kidney disease, unspecified: Secondary | ICD-10-CM | POA: Diagnosis not present

## 2018-05-13 DIAGNOSIS — F17210 Nicotine dependence, cigarettes, uncomplicated: Secondary | ICD-10-CM | POA: Diagnosis present

## 2018-05-13 DIAGNOSIS — Z888 Allergy status to other drugs, medicaments and biological substances status: Secondary | ICD-10-CM | POA: Diagnosis not present

## 2018-05-13 DIAGNOSIS — I509 Heart failure, unspecified: Secondary | ICD-10-CM | POA: Diagnosis not present

## 2018-05-13 DIAGNOSIS — Z951 Presence of aortocoronary bypass graft: Secondary | ICD-10-CM | POA: Diagnosis not present

## 2018-05-13 DIAGNOSIS — Z79899 Other long term (current) drug therapy: Secondary | ICD-10-CM | POA: Diagnosis not present

## 2018-05-13 DIAGNOSIS — G4733 Obstructive sleep apnea (adult) (pediatric): Secondary | ICD-10-CM | POA: Diagnosis present

## 2018-05-13 DIAGNOSIS — I25810 Atherosclerosis of coronary artery bypass graft(s) without angina pectoris: Secondary | ICD-10-CM | POA: Diagnosis present

## 2018-05-13 DIAGNOSIS — Z7902 Long term (current) use of antithrombotics/antiplatelets: Secondary | ICD-10-CM | POA: Diagnosis not present

## 2018-05-13 DIAGNOSIS — I2510 Atherosclerotic heart disease of native coronary artery without angina pectoris: Secondary | ICD-10-CM | POA: Diagnosis not present

## 2018-05-13 DIAGNOSIS — Z7982 Long term (current) use of aspirin: Secondary | ICD-10-CM | POA: Diagnosis not present

## 2018-05-13 DIAGNOSIS — R7989 Other specified abnormal findings of blood chemistry: Secondary | ICD-10-CM | POA: Diagnosis not present

## 2018-05-13 DIAGNOSIS — I110 Hypertensive heart disease with heart failure: Secondary | ICD-10-CM | POA: Diagnosis not present

## 2018-05-13 DIAGNOSIS — Z8673 Personal history of transient ischemic attack (TIA), and cerebral infarction without residual deficits: Secondary | ICD-10-CM | POA: Diagnosis not present

## 2018-05-13 DIAGNOSIS — I214 Non-ST elevation (NSTEMI) myocardial infarction: Secondary | ICD-10-CM | POA: Diagnosis not present

## 2018-05-13 DIAGNOSIS — E785 Hyperlipidemia, unspecified: Secondary | ICD-10-CM | POA: Diagnosis present

## 2018-05-13 DIAGNOSIS — I6529 Occlusion and stenosis of unspecified carotid artery: Secondary | ICD-10-CM | POA: Diagnosis not present

## 2018-05-13 DIAGNOSIS — I252 Old myocardial infarction: Secondary | ICD-10-CM | POA: Diagnosis not present

## 2018-05-13 DIAGNOSIS — I6521 Occlusion and stenosis of right carotid artery: Secondary | ICD-10-CM | POA: Diagnosis not present

## 2018-05-13 DIAGNOSIS — N183 Chronic kidney disease, stage 3 (moderate): Secondary | ICD-10-CM | POA: Diagnosis present

## 2018-05-13 DIAGNOSIS — I5023 Acute on chronic systolic (congestive) heart failure: Secondary | ICD-10-CM | POA: Diagnosis not present

## 2018-05-13 DIAGNOSIS — I714 Abdominal aortic aneurysm, without rupture: Secondary | ICD-10-CM | POA: Diagnosis present

## 2018-05-13 DIAGNOSIS — F1010 Alcohol abuse, uncomplicated: Secondary | ICD-10-CM | POA: Diagnosis present

## 2018-05-13 DIAGNOSIS — N179 Acute kidney failure, unspecified: Secondary | ICD-10-CM | POA: Diagnosis not present

## 2018-05-13 DIAGNOSIS — R05 Cough: Secondary | ICD-10-CM | POA: Diagnosis not present

## 2018-05-13 DIAGNOSIS — I130 Hypertensive heart and chronic kidney disease with heart failure and stage 1 through stage 4 chronic kidney disease, or unspecified chronic kidney disease: Secondary | ICD-10-CM | POA: Diagnosis present

## 2018-05-13 DIAGNOSIS — I248 Other forms of acute ischemic heart disease: Secondary | ICD-10-CM | POA: Diagnosis present

## 2018-05-13 DIAGNOSIS — I739 Peripheral vascular disease, unspecified: Secondary | ICD-10-CM | POA: Diagnosis present

## 2018-05-13 DIAGNOSIS — R0602 Shortness of breath: Secondary | ICD-10-CM | POA: Diagnosis not present

## 2018-05-13 DIAGNOSIS — K219 Gastro-esophageal reflux disease without esophagitis: Secondary | ICD-10-CM | POA: Diagnosis present

## 2018-05-13 DIAGNOSIS — Z7951 Long term (current) use of inhaled steroids: Secondary | ICD-10-CM | POA: Diagnosis not present

## 2018-05-13 DIAGNOSIS — I5043 Acute on chronic combined systolic (congestive) and diastolic (congestive) heart failure: Secondary | ICD-10-CM | POA: Diagnosis present

## 2018-05-13 LAB — LIPID PANEL
CHOL/HDL RATIO: 6.6 ratio
Cholesterol: 204 mg/dL — ABNORMAL HIGH (ref 0–200)
HDL: 31 mg/dL — AB (ref >40)
LDL CALC: 159 mg/dL — AB (ref 0–99)
TRIGLYCERIDES: 68 mg/dL (ref <150)
VLDL: 14 mg/dL (ref 0–40)

## 2018-05-13 LAB — BASIC METABOLIC PANEL
ANION GAP: 11 (ref 5–15)
BUN: 28 mg/dL — ABNORMAL HIGH (ref 6–20)
CO2: 20 mmol/L — AB (ref 22–32)
Calcium: 8.6 mg/dL — ABNORMAL LOW (ref 8.9–10.3)
Chloride: 103 mmol/L (ref 98–111)
Creatinine, Ser: 1.93 mg/dL — ABNORMAL HIGH (ref 0.61–1.24)
GFR calc non Af Amer: 36 mL/min — ABNORMAL LOW (ref >60)
GFR, EST AFRICAN AMERICAN: 42 mL/min — AB (ref >60)
GLUCOSE: 119 mg/dL — AB (ref 70–99)
POTASSIUM: 4.3 mmol/L (ref 3.5–5.1)
Sodium: 134 mmol/L — ABNORMAL LOW (ref 135–145)

## 2018-05-13 LAB — CBC
HEMATOCRIT: 41.2 % (ref 39.0–52.0)
Hemoglobin: 12.9 g/dL — ABNORMAL LOW (ref 13.0–17.0)
MCH: 27.3 pg (ref 26.0–34.0)
MCHC: 31.3 g/dL (ref 30.0–36.0)
MCV: 87.1 fL (ref 80.0–100.0)
NRBC: 0 % (ref 0.0–0.2)
Platelets: 195 10*3/uL (ref 150–400)
RBC: 4.73 MIL/uL (ref 4.22–5.81)
RDW: 16.2 % — ABNORMAL HIGH (ref 11.5–15.5)
WBC: 8.6 10*3/uL (ref 4.0–10.5)

## 2018-05-13 LAB — TROPONIN I: Troponin I: 2.59 ng/mL (ref <0.03)

## 2018-05-13 LAB — HIV ANTIBODY (ROUTINE TESTING W REFLEX): HIV Screen 4th Generation wRfx: NONREACTIVE

## 2018-05-13 LAB — PROTIME-INR
INR: 1.29
PROTHROMBIN TIME: 16 s — AB (ref 11.4–15.2)

## 2018-05-13 LAB — HEPARIN LEVEL (UNFRACTIONATED)
Heparin Unfractionated: 0.18 IU/mL — ABNORMAL LOW (ref 0.30–0.70)
Heparin Unfractionated: 0.5 IU/mL (ref 0.30–0.70)

## 2018-05-13 MED ORDER — FUROSEMIDE 10 MG/ML IJ SOLN
40.0000 mg | Freq: Once | INTRAMUSCULAR | Status: AC
  Administered 2018-05-13: 40 mg via INTRAVENOUS
  Filled 2018-05-13: qty 4

## 2018-05-13 MED ORDER — SODIUM CHLORIDE 0.9% FLUSH
10.0000 mL | Freq: Two times a day (BID) | INTRAVENOUS | Status: DC
  Administered 2018-05-13: 10 mL
  Administered 2018-05-15: 20 mL
  Administered 2018-05-16 – 2018-05-21 (×6): 10 mL

## 2018-05-13 MED ORDER — CYCLOBENZAPRINE HCL 10 MG PO TABS
5.0000 mg | ORAL_TABLET | Freq: Three times a day (TID) | ORAL | Status: DC | PRN
  Administered 2018-05-13 – 2018-05-20 (×7): 5 mg via ORAL
  Filled 2018-05-13 (×7): qty 1

## 2018-05-13 MED ORDER — FUROSEMIDE 10 MG/ML IJ SOLN
80.0000 mg | Freq: Two times a day (BID) | INTRAMUSCULAR | Status: DC
  Administered 2018-05-13 – 2018-05-16 (×7): 80 mg via INTRAVENOUS
  Filled 2018-05-13 (×7): qty 8

## 2018-05-13 MED ORDER — HYDROCODONE-ACETAMINOPHEN 5-325 MG PO TABS
1.0000 | ORAL_TABLET | Freq: Once | ORAL | Status: AC
  Administered 2018-05-13: 1 via ORAL
  Filled 2018-05-13: qty 1

## 2018-05-13 MED ORDER — SODIUM CHLORIDE 0.9% FLUSH
10.0000 mL | INTRAVENOUS | Status: DC | PRN
  Administered 2018-05-14: 10 mL
  Filled 2018-05-13: qty 40

## 2018-05-13 MED ORDER — HEPARIN BOLUS VIA INFUSION
2000.0000 [IU] | Freq: Once | INTRAVENOUS | Status: AC
  Administered 2018-05-13: 2000 [IU] via INTRAVENOUS
  Filled 2018-05-13: qty 2000

## 2018-05-13 MED ORDER — ENOXAPARIN SODIUM 40 MG/0.4ML ~~LOC~~ SOLN
40.0000 mg | Freq: Every day | SUBCUTANEOUS | Status: DC
  Administered 2018-05-13 – 2018-05-19 (×6): 40 mg via SUBCUTANEOUS
  Filled 2018-05-13 (×8): qty 0.4

## 2018-05-13 MED ORDER — MILRINONE LACTATE IN DEXTROSE 20-5 MG/100ML-% IV SOLN: 0.2500 ug/kg/min | INTRAVENOUS | Status: DC

## 2018-05-13 NOTE — Progress Notes (Signed)
Progress Note  Patient Name: Randy [name] Date of Encounter: 05/13/2018  Primary Cardiologist: Dr. Nicholes Mango, MD  Subjective   Pt having significant lower back pain this AM. States he has chronic back pain that is worse if he os laying around in the bed. No chest pain overnight. LE swelling with no improvement. Breathing better   Inpatient Medications    Scheduled Meds: . aspirin EC  81 mg Oral Daily  . atorvastatin  80 mg Oral QPM  . carvedilol  12.5 mg Oral BID WC  . citalopram  20 mg Oral Daily  . clopidogrel  75 mg Oral Q breakfast  . ezetimibe  10 mg Oral Daily  . furosemide  40 mg Intravenous BID   Continuous Infusions: . heparin 1,500 Units/hr (05/13/18 0540)   PRN Meds: acetaminophen, albuterol, nitroGLYCERIN, ondansetron (ZOFRAN) IV   Vital Signs    Vitals:   05/13/18 0053 05/13/18 0605 05/13/18 0621 05/13/18 0810  BP: 118/84  (!) 129/93 (!) 142/103  Pulse: 68  70 73  Resp: 20  18 16   Temp: 98.6 F (37 C)  (!) 97.4 F (36.3 C) 97.6 F (36.4 C)  TempSrc: Oral  Oral Oral  SpO2: 100%  100% 99%  Weight:  85.5 kg    Height:        Intake/Output Summary (Last 24 hours) at 05/13/2018 0911 Last data filed at 05/13/2018 0600 Gross per 24 hour  Intake 313.15 ml  Output 300 ml  Net 13.15 ml   Filed Weights   05/12/18 1028 05/12/18 1548 05/13/18 0605  Weight: 86.2 kg 85.9 kg 85.5 kg   Physical Exam   General: Older than stated age, NAD Skin: Warm, dry, intact  Head: Normocephalic, atraumatic, clear, moist mucus membranes. Neck: Negative for carotid bruits. No JVD Lungs: Diminished. No wheezes, rales, or rhonchi. Breathing is unlabored. Cardiovascular: RRR with S1 S2. No murmurs, rubs, gallops, or LV heave appreciated. Abdomen: Soft, non-tender, non-distended with normoactive bowel sounds. No obvious abdominal masses. MSK: Strength and tone appear normal for age. 5/5 in all extremities Extremities: BLE 2+ edema. No clubbing or cyanosis. DP/PT  pulses 1+ bilaterally Neuro: Alert and oriented. No focal deficits. No facial asymmetry. MAE spontaneously. Psych: Responds to questions appropriately with normal affect.    Labs    Chemistry Recent Labs  Lab 05/12/18 1035 05/12/18 1623 05/13/18 0331  NA 134*  --  134*  K 5.0  --  4.3  CL 102  --  103  CO2 20*  --  20*  GLUCOSE 141*  --  119*  BUN 27*  --  28*  CREATININE 1.90*  --  1.93*  CALCIUM 9.1  --  8.6*  PROT  --  5.8*  --   ALBUMIN  --  3.2*  --   AST  --  46*  --   ALT  --  36  --   ALKPHOS  --  110  --   BILITOT  --  1.4*  --   GFRNONAA 37*  --  36*  GFRAA 43*  --  42*  ANIONGAP 12  --  11     Hematology Recent Labs  Lab 05/12/18 1035 05/13/18 0331  WBC 9.6 8.6  RBC 5.29 4.73  HGB 14.5 12.9*  HCT 46.5 41.2  MCV 87.9 87.1  MCH 27.4 27.3  MCHC 31.2 31.3  RDW 16.4* 16.2*  PLT 259 195    Cardiac Enzymes Recent Labs  Lab 05/12/18  1623 05/12/18 2211 05/13/18 0331  TROPONINI 5.18* 4.10* 2.59*    Recent Labs  Lab 05/12/18 1040  TROPIPOC 3.24*     BNP Recent Labs  Lab 05/12/18 1035  BNP 2,066.0*     DDimer  Recent Labs  Lab 05/12/18 1035  DDIMER 4.43*    Radiology    Dg Chest 2 View  Result Date: 05/12/2018 CLINICAL DATA:  Shortness of breath and cough EXAM: CHEST - 2 VIEW COMPARISON:  03/31/2017 FINDINGS: Cardiac shadow is enlarged but stable. Postsurgical changes are noted. The lungs are well aerated bilaterally. No focal infiltrate or sizable effusion is seen. No acute bony abnormality is noted. IMPRESSION: Stable cardiomegaly.  No acute abnormality noted. Electronically Signed   By: Alcide Clever M.D.   On: 05/12/2018 11:41   Nm Pulmonary Perf And Vent  Result Date: 05/12/2018 CLINICAL DATA:  Shortness of breath.  Positive D-dimer study EXAM: NUCLEAR MEDICINE VENTILATION - PERFUSION LUNG SCAN VIEWS: Anterior, posterior, left lateral, right lateral, RPO, LPO, RAO, LAO-ventilation and perfusion RADIOPHARMACEUTICALS:  30.0 mCi of  Tc-66m DTPA aerosol inhalation and 4.0 mCi Tc45m MAA IV COMPARISON:  Chest radiograph May 12, 2018 FINDINGS: Ventilation: Radiotracer uptake is homogeneous and symmetric bilaterally. No ventilation defects are evident. Perfusion: Radiotracer uptake is homogeneous and symmetric bilaterally. No perfusion defects are evident. Cardiomegaly noted. IMPRESSION: No appreciable ventilation or perfusion defects. This study constitutes a very low probability of pulmonary embolus. Cardiomegaly noted. Electronically Signed   By: Bretta Bang III M.D.   On: 05/12/2018 15:44    Telemetry    05/13/18 NSR - Personally Reviewed  ECG    05/13/18 with no change from prior tracing - Personally Reviewed  Cardiac Studies   Echocardiogram 05/12/18: Study Conclusions  - Left ventricle: The cavity size was moderately dilated. Wall   thickness was normal. Systolic function was severely reduced. The   estimated ejection fraction was 15%. Diffuse hypokinesis. Doppler   parameters are consistent with restrictive physiology, indicative   of decreased left ventricular diastolic compliance and/or   increased left atrial pressure. Doppler parameters are consistent   with high ventricular filling pressure. - Left atrium: The atrium was severely dilated. - Right ventricle: The cavity size was mildly dilated. Systolic   function was moderately reduced. - Right atrium: The atrium was moderately dilated. - Tricuspid valve: There was moderate regurgitation. - Pulmonary arteries: Systolic pressure was mildly increased. PA   peak pressure: 44 mm Hg (S).  Impressions:  - Severe global reduction in LV systolic function; restrictive   filling; 4 chamber enlargement; moderate RV dysfunction; moderate   TR; mild pulmonary hypertension   Cardiac catheterization 12/29/2017:   Ost RCA to Prox RCA lesion is 100% stenosed.  Mid LM lesion is 80% stenosed.  Prox LAD lesion is 100% stenosed.  Ost 2nd Mrg to  2nd Mrg lesion is 100% stenosed.  2nd Mrg lesion is 99% stenosed.  Origin to Prox Graft lesion is 100% stenosed.  Prox Cx to Mid Cx lesion is 30% stenosed.  Mid Graft lesion is 30% stenosed.  Findings:  RA = 4 RV = 22/5 PA = 27/10 (16) PCW = 15 Fick cardiac output/index = 5.1/2.5 FA sat = 94% PA sat = 66%, 66%  Assessment: 1. Severe native CAD as above 2. LIMA to LAD patent 3. SVG to RCA occluded 4. SVG to OM-2 with 95% proximal lesion but no significant runoff vessel 5. EF 25% 6. Well compensated hemodynamics  Plan/Discussion:  Suspect EF is down  mainly due to ETOH but does have unprotected AV-groove LCX which feeds colalterals to distal RCA. Would consider PCI of distal left main.   Echocardiogram 01/20/18: Study Conclusions  - Left ventricle: The cavity size was normal. Wall thickness was increased in a pattern of mild LVH. Systolic function was moderately to severely reduced. The estimated ejection fraction was in the range of 30% to 35%. Diffuse hypokinesis with inferior akinesis. Doppler parameters are consistent with pseudonormal (grade 2 diastolic dysfunction). LV filling pressure is elevated. - Aortic valve: Trileaflet. Sclerosis without stenosis. - Mitral valve: Mildly thickened leaflets . There was trivial regurgitation. - Left atrium: Severely dilated. Severely dilated. - Right ventricle: The cavity size was mildly dilated. Mildly reduced systolic function. - Right atrium: Severely dilated. - Tricuspid valve: There was moderate regurgitation. - Pulmonary arteries: PA peak pressure: 43 mm Hg (S). - Inferior vena cava: The vessel was dilated. The respirophasic diameter changes were blunted (<50%), consistent with elevated central venous pressure.  Impressions:  - Compared with a prior study in 2018, the LVEF remains unchanged.  Patient Profile     57 y.o. male with history ofchronic combined HF (diagnosed 03/2017),  HTN, HLD, CAD (s/p CABG 2010), alcohol abuse, ongoing tobacco use, prior cocaine exposure 2010 (pt states occurred unknowingly),remoteprior R CEA, stenting of L carotid 2014, PAD s/p failed attempt at stenting of the left common iliac artery 2014, CKD III, CVA, and 4.3cm AAA (2017) who presented to [facility] with c/o SOB and LE swelling found to have an elevated troponin.    Assessment & Plan    1. NSTEMI with known hx of CAD s/p CABG: -Pt presented to worsening SOB over the last several weeks. He has a long and complicated cardiac hx with most recent cath in 12/2017 which showed severe native CAD with occluded SVG to RCA and SVG to OM-2 with 95% proximal lesion with consideration for PCI of distal LM. This was scheduled and never completed secondary to pt cancellation. He has known carotid artery disease and has followed with Dr. Myra Gianotti who recommended angiographyand stenting with distal embolic protection of the recurrent stenosis in the right common carotid artery which was never completed.  -Trop elevated at 3.24>5.18>4.10>2.59 overnight>>denies chest pain this AM  -Echocardiogram from 05/12/18 with further decreased LVEF, now down to 15% with diffuse hypokinesis with restrictive  filling; 4 chamber enlargement; moderate RV dysfunction; moderate TR and mild pulmonary hypertension -BNP on admission 2066 with s/s of acute HF exacerbation with SOB and LE swelling -D-dimer found to be 4.43>>VQ scan without PE  -Continue ASA, atorvastatin, carvedilol, Plavix -Continue IV lasix 40mg  twice daily for fluid volume overload and monitor with daily BMET -Continue Hep gtt per pharmacy -Weight, 188lb>>down from 190lb on admission  -I&O, net positive 13ml -Plan for CHF team to assess for assistance for next steps in plan>>>needs further diuresis and will eventually need cath for further evaluation and possible LM stenting.   2. Chronic combined systolic and diastolic CHF: -Last echocardiogram 7/20019 with LVEF  of 35% with diffuse hypokinesis with inferior akinesis and G2DD>>>now with further decrease in LV function to 15% -Concern given recent dizziness episode and acute onset of SOB and LE swelling>>concern for further progression of CAD -Pt cannot tolerate Entresto and is unclear as to what medications he is and is not taking  -Continue IV Lasix 40mg  twice daily for fluid volume overload and hold diuretics and ACE until renal function improved -Weight, 188lb>>down from 190lb on admission  -I&O, net positive  13ml  3. PAD: -Pt overdue for follow-up and never followed up for carotid disease with [facility].  -No show for LE vascular US  -Has c/o LE claudication   4. HTN: -Stable today,  -Will continue BB and hold ACE for now  5. HLD: -Statin restarted at last f/u visit in 01/2018 -Last LDL 159 on with goal of <70  6. Tobacco and marijuana use: -Cessation strongly encouraged   7. Lower back pain: -Reports chronic lower back pain with prolonged sitting>>>asking for NSAID and muscle relaxant -Will add Flexeril to regimen   Signed, Georgie Chard NP-C [facility] Pager: [phone] 05/13/2018, 9:11 AM     For questions or updates, please contact   Please consult [email or web address] for contact info under Cardiology/STEMI.

## 2018-05-13 NOTE — Progress Notes (Signed)
Patient says pain is better now that new medication for spasms was ordered.   Patient refuses cath with back pain, diet order added by NP.

## 2018-05-13 NOTE — Consult Note (Addendum)
Advanced Heart Failure Team Consult Note   Primary Physician: Howard Pouch, MD PCP-Cardiologist:  No primary care provider on file.  Reason for Consultation: A/C systolic HF  HPI:    Randy [name] is seen today for evaluation of A/C systolic HF at the request of Dr [place].   Randy [name] is a 57 y.o. male with history of combined HF (diagnosed 03/2017), HTN, HLD, CAD s/p CABG 2010 and PCI (last ~2014), tobacco use, CVA, CKD 3, PAD s/p failed attempt of stenting left common iliac artery 2014, and AAA.  He was seen in HF clinic 12/24/17 as a new patient for newly reduced EF from 55% to 35%. He was doing okay at the time. Lisinopril was switched to Entresto. He was scheduled for outpatient [facility] and repeat echo. Weight was 187 lbs.   He had [facility] 12/29/17, with results below. Dr [name] discussed with Dr Excell Seltzer possible PCI to distal left main. It was felt that he should get right CEA prior to PCI to decrease stroke risk. He did not make appointment for VVS and was sick for scheduled day of PCI.  He saw Randy [name] 02/01/18. He had run out of statin, which was refilled. Lisinopril was increased. He said he did not tolerate Entresto and refused to try. HF follow up with made, but he did not make appointment. Weight was 184 lbs.  He has been feeling more SOB over the last 3 weeks. Denies any CP. +orthopnea. +PND. +bendopnea. +BLE edema. He has good UOP with lasix. Appetite and energy level okay. He is dizzy with bending over. No cough, fever, or chills. Weights unchanged at home. Taking all medications. No ETOH in 3 weeks. Smokes 5 cig/week. Drinks 5-6 bottled waters daily. Limits salt intake.    Presented to [facility] 05/12/18 with worsening SOB.  Pertinent admission labs include: Troponin 5.18 > 4.10 > 2.59, Na 134, K 5.0, creatinine 1.90, BNP 2066 (prior 1587), WBC 9.6, Ddimer 4.43, VQ negative. LDL 159  CXR: stable CM. No acute abnormality  He has been diuresing with 40 mg IV lasix BID. Only  300 mls UOP charted, but he has been having frequent BMs so having a hard time using urinal seperately. Creatinine 1.9 > 1.93. Weight down 1 lb. Lisinopril held with AKI. SBP 120-140s.  He was started on a heparin drip with positive troponin. AHF team consulted to optimized HF prior to any interventions (PCI +/- CEA).   He feels less SOB. Denies CP. Main complaint is back pain, which feels better sitting up in chair.  Echo 05/12/18: EF 15%, diffuse HK, LA severely dilated, RV moderately reduced, RA moderately dilated, mod TR, PA peak pressure 44 mm Hg  Echo 01/20/18: EF 30-35%, grade 2 DD, RV mildly reduced  [facility] 12/29/17  Ost RCA to Prox RCA lesion is 100% stenosed.  Mid LM lesion is 80% stenosed.  Prox LAD lesion is 100% stenosed.  Ost 2nd Mrg to 2nd Mrg lesion is 100% stenosed.  2nd Mrg lesion is 99% stenosed.  Origin to Prox Graft lesion is 100% stenosed.  Prox Cx to Mid Cx lesion is 30% stenosed.  Mid Graft lesion is 30% stenosed.   Findings: RA = 4 RV = 22/5 PA = 27/10 (16) PCW = 15 Fick cardiac output/index = 5.1/2.5 FA sat = 94% PA sat = 66%, 66%  Assessment: 1. Severe native CAD as above 2. LIMA to LAD patent 3. SVG to RCA occluded 4. SVG to OM-2 with 95% proximal lesion  but no significant runoff vessel 5. EF 25% 6. Well compensated hemodynamics  Plan/Discussion: Suspect EF is down mainly due to ETOH but does have unprotected AV-groove LCX which feeds colalterals to distal RCA. Would consider PCI of distal left main.   Carotid US 04/14/16:  Right ICA 80-99%, Left ICA upper range <50% vs low range >50%.  Review of Systems: [y] = yes, [ ]  = no   General: Weight gain [ ] ; Weight loss [ ] ; Anorexia [ ] ; Fatigue [ ] ; Fever [ ] ; Chills [ ] ; Weakness [ ]   Cardiac: Chest pain/pressure [ ] ; Resting SOB [ ] ; Exertional SOB Cove.Etienne ]; Orthopnea Cove.Etienne ]; Pedal Edema Cove.Etienne ]; Palpitations [ ] ; Syncope [ ] ; Presyncope [ ] ; Paroxysmal nocturnal dyspnea[ y]  Pulmonary: Cough [ ] ;  Wheezing[ ] ; Hemoptysis[ ] ; Sputum [ ] ; Snoring [ ]   GI: Vomiting[ ] ; Dysphagia[ ] ; Melena[ ] ; Hematochezia [ ] ; Heartburn[ ] ; Abdominal pain [ ] ; Constipation [ ] ; Diarrhea [ ] ; BRBPR [ ]   GU: Hematuria[ ] ; Dysuria [ ] ; Nocturia[ ]   Vascular: Pain in legs with walking [ ] ; Pain in feet with lying flat [ ] ; Non-healing sores [ ] ; Stroke [ ] ; TIA [ ] ; Slurred speech [ ] ;  Neuro: Headaches[ ] ; Vertigo[ ] ; Seizures[ ] ; Paresthesias[ ] ;Blurred vision [ ] ; Diplopia [ ] ; Vision changes [ ]   Ortho/Skin: Arthritis [ ] ; Joint pain [ ] ; Muscle pain [ ] ; Joint swelling [ ] ; Back Pain Cove.Etienne ]; Rash [ ]   Psych: Depression[ ] ; Anxiety[ ]   Heme: Bleeding problems [ ] ; Clotting disorders [ ] ; Anemia [ ]   Endocrine: Diabetes [ ] ; Thyroid dysfunction[ ]   Home Medications Prior to Admission medications   Medication Sig Start Date End Date Taking? Authorizing Provider  albuterol (PROVENTIL HFA;VENTOLIN HFA) 108 (90 Base) MCG/ACT inhaler Inhale 2 puffs into the lungs every 6 (six) hours as needed for wheezing or shortness of breath.   Yes [provider]  aspirin EC 81 MG tablet Take 1 tablet (81 mg total) by mouth daily. 03/31/16  Yes Howard Pouch, MD  atorvastatin (LIPITOR) 80 MG tablet Take 1 tablet (80 mg total) by mouth every evening. 02/01/18 05/12/18 Yes Dunn, Dayna N, PA-C  carvedilol (COREG) 12.5 MG tablet Take 1 tablet (12.5 mg total) by mouth 2 (two) times daily with a meal. 09/18/17  Yes Howard Pouch, MD  citalopram (CELEXA) 20 MG tablet Take 1 tablet (20 mg total) by mouth daily. 05/12/18  Yes Howard Pouch, MD  clopidogrel (PLAVIX) 75 MG tablet Take 1 tablet (75 mg total) by mouth daily with breakfast. 01/23/17  Yes Wendee Beavers, DO  ezetimibe (ZETIA) 10 MG tablet Take 1 tablet (10 mg total) by mouth daily. 05/12/18  Yes Howard Pouch, MD  furosemide (LASIX) 20 MG tablet TAKE 1 TABLET BY MOUTH ONCE DAILY 02/15/18  Yes Howard Pouch, MD  lisinopril (PRINIVIL,ZESTRIL) 10 MG tablet Take 1 tablet (10 mg  total) by mouth daily. 02/01/18 05/12/18 Yes Dunn, Dayna N, PA-C  omeprazole (PRILOSEC OTC) 20 MG tablet Take 20 mg by mouth daily.   Yes [provider]  spironolactone (ALDACTONE) 25 MG tablet Take 1 tablet (25 mg total) by mouth daily. 02/10/18 05/11/18  , Bevelyn Buckles, MD    Past Medical History: Past Medical History:  Diagnosis Date  . AAA (abdominal aortic aneurysm) [facility]) July 2013  . Alcohol abuse   . Anxiety   . CAD (coronary artery disease)    a. s/p CABG 2010.  [name]  Carotid artery occlusion    a. prior R CEA, stenting of L carotid 2014. b. Severe carotid restenosis in 2017 -> planned for surgery but pt did not f/u.  [name] Chronic combined systolic and diastolic CHF (congestive heart failure) (HCC)   . GERD (gastroesophageal reflux disease)   . Heart attack (HCC) 2010  . Hx of CABG 2010  . Hyperlipidemia   . Hypertension   . Lung nodule   . Polysubstance abuse (HCC)    a. mention of cocaine positivity in 2010 with patient stating unknowingly exposed, also EtOH.  [name] PVD (peripheral vascular disease) (HCC)    a. PAD s/p failed attempt at stenting of the left common iliac artery 2014.  . Stroke (HCC)   . Uncontrolled hypertension 02/02/2012    Past Surgical History: Past Surgical History:  Procedure Laterality Date  . ABDOMINAL AORTAGRAM N/A 04/19/2013   Procedure: ABDOMINAL AORTAGRAM;  Surgeon: Nada Libman, MD;  Location: [facility] CATH LAB;  Service: Cardiovascular;  Laterality: N/A;  . CAROTID ENDARTERECTOMY Left 01-13-13   Attempted cea  . CAROTID STENT INSERTION Left 02/22/2013   Procedure: CAROTID STENT INSERTION;  Surgeon: Nada Libman, MD;  Location: [facility] CATH LAB;  Service: Cardiovascular;  Laterality: Left;  . CORONARY ARTERY BYPASS GRAFT  2010  . ENDARTERECTOMY Left 01/13/2013   Procedure: ATTEMPTED ENDARTERECTOMY CAROTID;  Surgeon: Nada Libman, MD;  Location: [facility] OR;  Service: Vascular;  Laterality: Left;  . RIGHT/LEFT HEART CATH AND CORONARY/GRAFT  ANGIOGRAPHY N/A 12/29/2017   Procedure: RIGHT/LEFT HEART CATH AND CORONARY/GRAFT ANGIOGRAPHY;  Surgeon: Dolores Patty, MD;  Location: [facility] INVASIVE CV LAB;  Service: Cardiovascular;  Laterality: N/A;    Family History: Family History  Problem Relation Age of Onset  . Asthma Mother   . Diabetes Mother   . Hyperlipidemia Mother   . Hypertension Mother   . Lung cancer Father   . Cancer Father   . Stroke Sister     Social History: Social History   Socioeconomic History  . Marital status: Widowed    Spouse name: Not on file  . Number of children: 1  . Years of education: 9th  . Highest education level: Not on file  Occupational History  . Occupation: not working.   Social Needs  . Financial resource strain: Not on file  . Food insecurity:    Worry: Not on file    Inability: Not on file  . Transportation needs:    Medical: Not on file    Non-medical: Not on file  Tobacco Use  . Smoking status: Current Some Day Smoker    Packs/day: 0.50    Years: 30.00    Pack years: 15.00    Types: Cigarettes  . Smokeless tobacco: Never Used  Substance and Sexual Activity  . Alcohol use: Yes    Alcohol/week: 5.0 standard drinks    Types: 5 Cans of beer per week    Comment: occasionally  . Drug use: Yes    Types: Marijuana  . Sexual activity: Yes  Lifestyle  . Physical activity:    Days per week: Not on file    Minutes per session: Not on file  . Stress: Not on file  Relationships  . Social connections:    Talks on phone: Not on file    Gets together: Not on file    Attends religious service: Not on file    Active member of club or organization: Not on file    Attends meetings of clubs  or organizations: Not on file    Relationship status: Not on file  Other Topics Concern  . Not on file  Social History Narrative   Pt is widowed and lives alone.   Patient is right handed   Patient drinks about 2cups of caffeine daily.       Allergies:  Allergies  Allergen Reactions    . Entresto [Sacubitril-Valsartan]     Dizziness, sick on stomach    Objective:    Vital Signs:   Temp:  [97.4 F (36.3 C)-98.6 F (37 C)] 97.6 F (36.4 C) (11/14 0810) Pulse Rate:  [59-94] 79 (11/14 1012) Resp:  [15-20] 16 (11/14 0810) BP: (93-152)/(70-111) 139/107 (11/14 1012) SpO2:  [96 %-100 %] 99 % (11/14 0810) Weight:  [85.5 kg-85.9 kg] 85.5 kg (11/14 0605) Last BM Date: 05/12/18  Weight change: Filed Weights   05/12/18 1028 05/12/18 1548 05/13/18 0605  Weight: 86.2 kg 85.9 kg 85.5 kg    Intake/Output:   Intake/Output Summary (Last 24 hours) at 05/13/2018 1113 Last data filed at 05/13/2018 1103 Gross per 24 hour  Intake 313.15 ml  Output 300 ml  Net 13.15 ml      Physical Exam    General:  Appears fatigued. No resp difficulty HEENT: normal Neck: supple. JVP 10-12. Carotids 2+ bilat; no bruits. No lymphadenopathy or thyromegaly appreciated. Cor: PMI nondisplaced. Regular rate & rhythm. No rubs, gallops or murmurs. Lungs: clear Abdomen: soft, nontender, nondistended. No hepatosplenomegaly. No bruits or masses. Good bowel sounds. Extremities: no cyanosis, clubbing, rash, 2+ edema in BLE Neuro: alert & orientedx3, cranial nerves grossly intact. moves all 4 extremities w/o difficulty. Affect pleasant   Telemetry   NSR/SB 50-60s. Personally reviewed.   EKG    EKG 11/13: NSR 88 bpm, incomplete LBBB. Looks similar to previous EKG 11/14: NSR 71 bpm, incomplete LBBB  Labs   Basic Metabolic Panel: Recent Labs  Lab 05/12/18 1035 05/13/18 0331  NA 134* 134*  K 5.0 4.3  CL 102 103  CO2 20* 20*  GLUCOSE 141* 119*  BUN 27* 28*  CREATININE 1.90* 1.93*  CALCIUM 9.1 8.6*    Liver Function Tests: Recent Labs  Lab 05/12/18 1623  AST 46*  ALT 36  ALKPHOS 110  BILITOT 1.4*  PROT 5.8*  ALBUMIN 3.2*   No results for input(s): LIPASE, AMYLASE in the last 168 hours. No results for input(s): AMMONIA in the last 168 hours.  CBC: Recent Labs  Lab  05/12/18 1035 05/13/18 0331  WBC 9.6 8.6  HGB 14.5 12.9*  HCT 46.5 41.2  MCV 87.9 87.1  PLT 259 195    Cardiac Enzymes: Recent Labs  Lab 05/12/18 1623 05/12/18 2211 05/13/18 0331  TROPONINI 5.18* 4.10* 2.59*    BNP: BNP (last 3 results) Recent Labs    05/12/18 1035  BNP 2,066.0*    ProBNP (last 3 results) No results for input(s): PROBNP in the last 8760 hours.   CBG: No results for input(s): GLUCAP in the last 168 hours.  Coagulation Studies: Recent Labs    05/13/18 0331  LABPROT 16.0*  INR 1.29     Imaging   Dg Chest 2 View  Result Date: 05/12/2018 CLINICAL DATA:  Shortness of breath and cough EXAM: CHEST - 2 VIEW COMPARISON:  03/31/2017 FINDINGS: Cardiac shadow is enlarged but stable. Postsurgical changes are noted. The lungs are well aerated bilaterally. No focal infiltrate or sizable effusion is seen. No acute bony abnormality is noted. IMPRESSION: Stable cardiomegaly.  No acute abnormality  noted. Electronically Signed   By: Alcide Clever M.D.   On: 05/12/2018 11:41   Nm Pulmonary Perf And Vent  Result Date: 05/12/2018 CLINICAL DATA:  Shortness of breath.  Positive D-dimer study EXAM: NUCLEAR MEDICINE VENTILATION - PERFUSION LUNG SCAN VIEWS: Anterior, posterior, left lateral, right lateral, RPO, LPO, RAO, LAO-ventilation and perfusion RADIOPHARMACEUTICALS:  30.0 mCi of Tc-72m DTPA aerosol inhalation and 4.0 mCi Tc44m MAA IV COMPARISON:  Chest radiograph May 12, 2018 FINDINGS: Ventilation: Radiotracer uptake is homogeneous and symmetric bilaterally. No ventilation defects are evident. Perfusion: Radiotracer uptake is homogeneous and symmetric bilaterally. No perfusion defects are evident. Cardiomegaly noted. IMPRESSION: No appreciable ventilation or perfusion defects. This study constitutes a very low probability of pulmonary embolus. Cardiomegaly noted. Electronically Signed   By: Bretta Bang III M.D.   On: 05/12/2018 15:44      Medications:      Current Medications: . aspirin EC  81 mg Oral Daily  . atorvastatin  80 mg Oral QPM  . carvedilol  12.5 mg Oral BID WC  . citalopram  20 mg Oral Daily  . clopidogrel  75 mg Oral Q breakfast  . ezetimibe  10 mg Oral Daily  . furosemide  40 mg Intravenous BID     Infusions: . heparin 1,500 Units/hr (05/13/18 0540)       Patient Profile   Randy [name] is a 57 y.o. male with history of combined HF (diagnosed 03/2017), HTN, HLD, CAD s/p CABG 2010 and PCI (last ~2014), former tobacco use, CVA, and AAA.  Assessment/Plan   1. Acute on chronic systolic HF. Echo in 2017 showed EF 55% - Echo 03/2017: EF 30-35% with grade 2 DD. - Echo 01/20/18: EF 30-35%, grade 2 DD, RV mildly reduced. - Echo 05/12/18: EF 15%, diffuse HK, LA severely dilated, RV moderately reduced, RA moderately dilated, mod TR, PA peak pressure 44 mm Hg - Volume elevated on exam. - Has been diuresing with 40 mg IV lasix BID. Increase to 80 mg IV BID.  - Hold coreg with ?need for milrinone. May need for RV support. - ACE and spiro on hold with AKI. Did not tolerate with Entresto with vomiting after each dose. - Hold off on dig with AKI.  - Could add hydral/imdur for BP control if we do not need milrinone.   2. CAD s/p CABG 2010 -> NSTEMI - LHC 12/29/17 showed patent LIMA to LAD, SVG to RCA occluded, SVG to OM-2 with 95% prox lesion. Has unprotected AV-groove LCX which feeds collaterals to distal RCA. Interventional team consulted for ?PCI of distal left main. - Troponin 5.18 > 4.10 > 2.59 - Denies any CP. - Continue ASA, plavix, statin - Continue heparin drip for NSTEMI - Will need PCI once optimized. Per Dr Elvis Coil note, it sounds like this now takes priority over CEA now.   3. Carotid stenosis - Carotid US 04/14/16: Right ICA 80-99%, Left ICA upper range <50% vs low range >50%. - Needs right CEA at some point. VVS to see him this admission per patient.    4. HTN - SBP 110-140s  5. Acute on CKD -  Baseline ~1.2-1.4 - Creatinine 1.93 today  6. ?OSA - Needs sleep study outpatient  7. Alcohol abuse - No ETOH in 3 weeks.   8. Hyperlipidemia - LDL 159. Statin restarted    Medication concerns reviewed with patient and pharmacy team. Barriers identified: compliance with follow up.   Addendum: Will place PICC line to monitor coox and CVP. Transfer  to stepdown for CVP monitoring. If coox low, will plan to start milrinone drip.    Length of Stay: 0  Alford Highland, NP  05/13/2018, 11:13 AM  Advanced Heart Failure Team Pager [phone] (M-F; 7a - 4p)  Please contact [facility] for night-coverage after hours (4p -7a ) and weekends on [email or web address] Patient seen and examined with the above-signed Advanced Practice Provider and/or Housestaff. I personally reviewed laboratory data, imaging studies and relevant notes. I independently examined the patient and formulated the important aspects of the plan. I have edited the note to reflect any of my changes or salient points. I have personally discussed the plan with the patient and/or family.   57 y/o with severe ischemic CM s/p CABG admitted with recurrent CHF and NSTEMI. EF previously in th 35% range but echo this admit with EF 10-15% with moderate RV dysfunction (Personally reviewed). Previously we had him scheduled for PCI of protected LM/ostial LCX and carotid stenting but he cancelled because he said he was feeling good. Has been only intermittently compliant with meds. Says he is only drinking 6 beers per week but I suspect more.   On exam Sitting in chair due to back pain JVP hard to see looks 8-9 Cor RRR no obvious s3 Lungs cta Ab soft NT mildly distended Ext 2-3+ edema  He has significant volume overload with rising creatinine. Echo shos severe biventricular dysfunction. Suspect low output. Will place PICC to check co-ox/CVP will likely need inotrope tune-up. Plan LM/LCX PCI next week with Dr. Excell Seltzer. VVS also involved and wil  discuss timing of carotid stenting.   Arvilla Meres, MD  5:03 PM

## 2018-05-13 NOTE — Plan of Care (Signed)
  Problem: Education: Goal: Knowledge of General Education information will improve Description: Including pain rating scale, medication(s)/side effects and non-pharmacologic comfort measures Outcome: Progressing   Problem: Health Behavior/Discharge Planning: Goal: Ability to manage health-related needs will improve Outcome: Progressing   Problem: Coping: Goal: Level of anxiety will decrease Outcome: Progressing   Problem: Elimination: Goal: Will not experience complications related to urinary retention Outcome: Progressing   Problem: Pain Managment: Goal: General experience of comfort will improve Outcome: Progressing   

## 2018-05-13 NOTE — Progress Notes (Signed)
ANTICOAGULATION CONSULT NOTE   Pharmacy Consult for heparin Indication: CHF/ACS  Allergies  Allergen Reactions  . Entresto [Sacubitril-Valsartan]     Dizziness, sick on stomach    Patient Measurements: Height: 5\' 11"  (180.3 cm) Weight: 188 lb 8 oz (85.5 kg) IBW/kg (Calculated) : 75.3 Heparin Dosing Weight: 86.2kg  Vital Signs: Temp: 97.7 F (36.5 C) (11/14 1159) Temp Source: Oral (11/14 1159) BP: 113/76 (11/14 1159) Pulse Rate: 64 (11/14 1159)  Labs: Recent Labs    05/12/18 1035 05/12/18 1623 05/12/18 2211 05/13/18 0331 05/13/18 1206  HGB 14.5  --   --  12.9*  --   HCT 46.5  --   --  41.2  --   PLT 259  --   --  195  --   LABPROT  --   --   --  16.0*  --   INR  --   --   --  1.29  --   HEPARINUNFRC  --  0.25*  --  0.18* 0.50  CREATININE 1.90*  --   --  1.93*  --   TROPONINI  --  5.18* 4.10* 2.59*  --     Estimated Creatinine Clearance: 43.9 mL/min (A) (by C-G formula based on SCr of 1.93 mg/dL (H)).  Assessment: 57 y.o. male with CHF and NSTEMI on heparin for anticoagulation.  HL therapeutic at 0.5 units/ml. Hemoglobin 12.9,  Plt 195, no signs/symtoms of bleeding noted.   Goal of Therapy:  Heparin level 0.3-0.7 units/ml Monitor platelets by anticoagulation protocol: Yes   Plan:  Continue heparin 1500 units/hr Check heparin level in 8 hours and daily Continue to monitor H&H and platelets  Amanda Pea, Ilda Basset D PGY1 Pharmacy Resident  Phone [phone] Please use AMION for clinical pharmacists numbers  05/13/2018      1:39 PM

## 2018-05-13 NOTE — Progress Notes (Signed)
ANTICOAGULATION CONSULT NOTE   Pharmacy Consult for heparin Indication: CHF/ACS  Allergies  Allergen Reactions  . Entresto [Sacubitril-Valsartan]     Dizziness, sick on stomach    Patient Measurements: Height: 5\' 11"  (180.3 cm) Weight: 189 lb 5 oz (85.9 kg) IBW/kg (Calculated) : 75.3 Heparin Dosing Weight: 86.2kg  Vital Signs: Temp: 98.6 F (37 C) (11/14 0053) Temp Source: Oral (11/14 0053) BP: 118/84 (11/14 0053) Pulse Rate: 68 (11/14 0053)  Labs: Recent Labs    05/12/18 1035 05/12/18 1623 05/12/18 2211 05/13/18 0331  HGB 14.5  --   --  12.9*  HCT 46.5  --   --  41.2  PLT 259  --   --  195  HEPARINUNFRC  --  0.25*  --  0.18*  CREATININE 1.90*  --   --   --   TROPONINI  --  5.18* 4.10*  --     Estimated Creatinine Clearance: 44.6 mL/min (A) (by C-G formula based on SCr of 1.9 mg/dL (H)).  Assessment: 57 y.o. male with CHF and elevated cardiac markers for heparin  Goal of Therapy:  Heparin level 0.3-0.7 units/ml Monitor platelets by anticoagulation protocol: Yes   Plan:  Heparin 2000 units IV bolus, then increase heparin  1500 units/hr Check heparin level in 8 hours.   Geannie Risen, PharmD, BCPS  05/13/2018,5:09 AM

## 2018-05-13 NOTE — Progress Notes (Signed)
Peripherally Inserted Central Catheter/Midline Placement  The IV Nurse has discussed with the patient and/or persons authorized to consent for the patient, the purpose of this procedure and the potential benefits and risks involved with this procedure.  The benefits include less needle sticks, lab draws from the catheter, and the patient may be discharged home with the catheter. Risks include, but not limited to, infection, bleeding, blood clot (thrombus formation), and puncture of an artery; nerve damage and irregular heartbeat and possibility to perform a PICC exchange if needed/ordered by physician.  Alternatives to this procedure were also discussed.  Bard Power PICC patient education guide, fact sheet on infection prevention and patient information card has been provided to patient /or left at bedside.    PICC/Midline Placement Documentation  PICC Double Lumen 05/13/18 PICC Right Basilic 37 cm 0 cm (Active)  Indication for Insertion or Continuance of Line Chronic illness with exacerbations (CF, Sickle Cell, etc.) 05/13/2018  4:45 PM  Exposed Catheter (cm) 0 cm 05/13/2018  4:45 PM  Site Assessment Clean;Dry;Intact 05/13/2018  4:45 PM  Lumen #1 Status Flushed;Saline locked;Blood return noted 05/13/2018  4:45 PM  Lumen #2 Status Flushed;Saline locked;Blood return noted 05/13/2018  4:45 PM  Dressing Type Transparent;Securing device 05/13/2018  4:45 PM  Dressing Status Clean;Dry;Intact;Antimicrobial disc in place 05/13/2018  4:45 PM  Dressing Change Due 05/20/18 05/13/2018  4:45 PM       Romie Jumper 05/13/2018, 4:49 PM

## 2018-05-13 NOTE — Plan of Care (Signed)
  Problem: Clinical Measurements: Goal: Will remain free from infection Outcome: Progressing   Problem: Coping: Goal: Level of anxiety will decrease Outcome: Progressing   Problem: Elimination: Goal: Will not experience complications related to bowel motility Outcome: Progressing Goal: Will not experience complications related to urinary retention Outcome: Progressing   Problem: Pain Managment: Goal: General experience of comfort will improve Outcome: Progressing   Problem: Safety: Goal: Ability to remain free from injury will improve Outcome: Progressing   Problem: Skin Integrity: Goal: Risk for impaired skin integrity will decrease Outcome: Progressing   

## 2018-05-13 NOTE — Progress Notes (Signed)
Report giving to nurse on 4E, explained to patient his plan of care and reason for transfer.

## 2018-05-14 LAB — BASIC METABOLIC PANEL
Anion gap: 11 (ref 5–15)
BUN: 26 mg/dL — AB (ref 6–20)
CALCIUM: 8.7 mg/dL — AB (ref 8.9–10.3)
CO2: 26 mmol/L (ref 22–32)
CREATININE: 2.07 mg/dL — AB (ref 0.61–1.24)
Chloride: 97 mmol/L — ABNORMAL LOW (ref 98–111)
GFR calc Af Amer: 39 mL/min — ABNORMAL LOW (ref >60)
GFR calc non Af Amer: 33 mL/min — ABNORMAL LOW (ref >60)
GLUCOSE: 118 mg/dL — AB (ref 70–99)
Potassium: 4 mmol/L (ref 3.5–5.1)
Sodium: 134 mmol/L — ABNORMAL LOW (ref 135–145)

## 2018-05-14 LAB — CBC
HEMATOCRIT: 42.1 % (ref 39.0–52.0)
Hemoglobin: 13.2 g/dL (ref 13.0–17.0)
MCH: 27.4 pg (ref 26.0–34.0)
MCHC: 31.4 g/dL (ref 30.0–36.0)
MCV: 87.3 fL (ref 80.0–100.0)
Platelets: 207 10*3/uL (ref 150–400)
RBC: 4.82 MIL/uL (ref 4.22–5.81)
RDW: 16 % — AB (ref 11.5–15.5)
WBC: 7.1 10*3/uL (ref 4.0–10.5)
nRBC: 0 % (ref 0.0–0.2)

## 2018-05-14 LAB — COOXEMETRY PANEL
CARBOXYHEMOGLOBIN: 1 % (ref 0.5–1.5)
Methemoglobin: 1.7 % — ABNORMAL HIGH (ref 0.0–1.5)
O2 SAT: 43.1 %
TOTAL HEMOGLOBIN: 13.5 g/dL (ref 12.0–16.0)

## 2018-05-14 MED ORDER — MILRINONE LACTATE IN DEXTROSE 20-5 MG/100ML-% IV SOLN
0.1250 ug/kg/min | INTRAVENOUS | Status: DC
  Administered 2018-05-14 – 2018-05-19 (×7): 0.25 ug/kg/min via INTRAVENOUS
  Administered 2018-05-20: 0.125 ug/kg/min via INTRAVENOUS
  Filled 2018-05-14 (×10): qty 100

## 2018-05-14 MED ORDER — POTASSIUM CHLORIDE CRYS ER 20 MEQ PO TBCR
20.0000 meq | EXTENDED_RELEASE_TABLET | Freq: Two times a day (BID) | ORAL | Status: DC
  Administered 2018-05-14 – 2018-05-15 (×3): 20 meq via ORAL
  Filled 2018-05-14 (×3): qty 1

## 2018-05-14 MED ORDER — CLOPIDOGREL BISULFATE 75 MG PO TABS: 75.0000 mg | ORAL_TABLET | Freq: Every day | ORAL | 4 refills | Status: DC

## 2018-05-14 NOTE — Plan of Care (Signed)
  Problem: Education: Goal: Knowledge of General Education information will improve Description Including pain rating scale, medication(s)/side effects and non-pharmacologic comfort measures Outcome: Progressing   Problem: Health Behavior/Discharge Planning: Goal: Ability to manage health-related needs will improve Outcome: Progressing   Problem: Clinical Measurements: Goal: Will remain free from infection Outcome: Progressing   Problem: Nutrition: Goal: Adequate nutrition will be maintained Outcome: Progressing   Problem: Safety: Goal: Ability to remain free from injury will improve Outcome: Progressing   Problem: Safety: Goal: Ability to remain free from injury will improve Outcome: Progressing   Problem: Education: Goal: Knowledge of disease or condition will improve Outcome: Not Progressing

## 2018-05-14 NOTE — Progress Notes (Signed)
Progress Note  Patient Name: Randy [name] Date of Encounter: 05/14/2018  Primary Cardiologist: Dr. Nicholes Mango, MD  Subjective   Pt reports his breathing is better. No chest pain. PICC line in place. Back pain is improved.   Inpatient Medications    Scheduled Meds: . aspirin EC  81 mg Oral Daily  . atorvastatin  80 mg Oral QPM  . citalopram  20 mg Oral Daily  . clopidogrel  75 mg Oral Q breakfast  . enoxaparin (LOVENOX) injection  40 mg Subcutaneous QHS  . ezetimibe  10 mg Oral Daily  . furosemide  80 mg Intravenous BID  . potassium chloride  20 mEq Oral BID  . sodium chloride flush  10-40 mL Intracatheter Q12H   Continuous Infusions: . milrinone     PRN Meds: acetaminophen, albuterol, cyclobenzaprine, nitroGLYCERIN, ondansetron (ZOFRAN) IV, sodium chloride flush   Vital Signs    Vitals:   05/13/18 2055 05/14/18 0410 05/14/18 0500 05/14/18 0700  BP: (!) 132/94 (!) 154/103    Pulse: 75 82    Resp: 19  (!) 27 20  Temp: 97.6 F (36.4 C) (!) 97.2 F (36.2 C)    TempSrc: Oral Oral    SpO2: 100% 100%    Weight:  83.6 kg    Height:        Intake/Output Summary (Last 24 hours) at 05/14/2018 0909 Last data filed at 05/14/2018 0830 Gross per 24 hour  Intake 1080 ml  Output 3100 ml  Net -2020 ml   Filed Weights   05/12/18 1548 05/13/18 0605 05/14/18 0410  Weight: 85.9 kg 85.5 kg 83.6 kg   Physical Exam   General: Older than stated age, NAD Skin: Warm, dry, intact  Head: Normocephalic, atraumatic, clear, moist mucus membranes. Poor dentition. Neck: Negative for carotid bruits. No JVD Lungs: Diminished. No wheezes, rales, or rhonchi. Breathing is unlabored. Cardiovascular: RRR with S1 S2. No murmurs, rubs, gallops, or LV heave appreciated. Abdomen: Soft, non-tender, non-distended with normoactive bowel sounds. No obvious abdominal masses. MSK: Strength and tone appear normal for age. 5/5 in all extremities Extremities: BLE 2+ edema. No clubbing or  cyanosis. DP/PT pulses 1+ bilaterally Neuro: Alert and oriented. No focal deficits. No facial asymmetry. MAE spontaneously. Psych: Responds to questions appropriately with normal affect.    Labs    Chemistry Recent Labs  Lab 05/12/18 1035 05/12/18 1623 05/13/18 0331 05/14/18 0423  NA 134*  --  134* 134*  K 5.0  --  4.3 4.0  CL 102  --  103 97*  CO2 20*  --  20* 26  GLUCOSE 141*  --  119* 118*  BUN 27*  --  28* 26*  CREATININE 1.90*  --  1.93* 2.07*  CALCIUM 9.1  --  8.6* 8.7*  PROT  --  5.8*  --   --   ALBUMIN  --  3.2*  --   --   AST  --  46*  --   --   ALT  --  36  --   --   ALKPHOS  --  110  --   --   BILITOT  --  1.4*  --   --   GFRNONAA 37*  --  36* 33*  GFRAA 43*  --  42* 39*  ANIONGAP 12  --  11 11     Hematology Recent Labs  Lab 05/12/18 1035 05/13/18 0331 05/14/18 0423  WBC 9.6 8.6 7.1  RBC 5.29 4.73 4.82  HGB 14.5 12.9* 13.2  HCT 46.5 41.2 42.1  MCV 87.9 87.1 87.3  MCH 27.4 27.3 27.4  MCHC 31.2 31.3 31.4  RDW 16.4* 16.2* 16.0*  PLT 259 195 207    Cardiac Enzymes Recent Labs  Lab 05/12/18 1623 05/12/18 2211 05/13/18 0331  TROPONINI 5.18* 4.10* 2.59*    Recent Labs  Lab 05/12/18 1040  TROPIPOC 3.24*     BNP Recent Labs  Lab 05/12/18 1035  BNP 2,066.0*     DDimer  Recent Labs  Lab 05/12/18 1035  DDIMER 4.43*    Radiology    Dg Chest 2 View  Result Date: 05/12/2018 CLINICAL DATA:  Shortness of breath and cough EXAM: CHEST - 2 VIEW COMPARISON:  03/31/2017 FINDINGS: Cardiac shadow is enlarged but stable. Postsurgical changes are noted. The lungs are well aerated bilaterally. No focal infiltrate or sizable effusion is seen. No acute bony abnormality is noted. IMPRESSION: Stable cardiomegaly.  No acute abnormality noted. Electronically Signed   By: Alcide Clever M.D.   On: 05/12/2018 11:41   Nm Pulmonary Perf And Vent  Result Date: 05/12/2018 CLINICAL DATA:  Shortness of breath.  Positive D-dimer study EXAM: NUCLEAR MEDICINE  VENTILATION - PERFUSION LUNG SCAN VIEWS: Anterior, posterior, left lateral, right lateral, RPO, LPO, RAO, LAO-ventilation and perfusion RADIOPHARMACEUTICALS:  30.0 mCi of Tc-29m DTPA aerosol inhalation and 4.0 mCi Tc73m MAA IV COMPARISON:  Chest radiograph May 12, 2018 FINDINGS: Ventilation: Radiotracer uptake is homogeneous and symmetric bilaterally. No ventilation defects are evident. Perfusion: Radiotracer uptake is homogeneous and symmetric bilaterally. No perfusion defects are evident. Cardiomegaly noted. IMPRESSION: No appreciable ventilation or perfusion defects. This study constitutes a very low probability of pulmonary embolus. Cardiomegaly noted. Electronically Signed   By: Bretta Bang III M.D.   On: 05/12/2018 15:44   [place] Ekg Site Rite  Result Date: 05/13/2018 If Site Rite image not attached, placement could not be confirmed due to current cardiac rhythm.   Telemetry    05/13/18 NSR - Personally Reviewed  ECG    05/13/18 with no change from prior tracing - Personally Reviewed  Cardiac Studies   Echocardiogram 05/12/18: Study Conclusions  - Left ventricle: The cavity size was moderately dilated. Wall   thickness was normal. Systolic function was severely reduced. The   estimated ejection fraction was 15%. Diffuse hypokinesis. Doppler   parameters are consistent with restrictive physiology, indicative   of decreased left ventricular diastolic compliance and/or   increased left atrial pressure. Doppler parameters are consistent   with high ventricular filling pressure. - Left atrium: The atrium was severely dilated. - Right ventricle: The cavity size was mildly dilated. Systolic   function was moderately reduced. - Right atrium: The atrium was moderately dilated. - Tricuspid valve: There was moderate regurgitation. - Pulmonary arteries: Systolic pressure was mildly increased. PA   peak pressure: 44 mm Hg (S).  Impressions:  - Severe global reduction in LV  systolic function; restrictive   filling; 4 chamber enlargement; moderate RV dysfunction; moderate   TR; mild pulmonary hypertension   Cardiac catheterization 12/29/2017:   Ost RCA to Prox RCA lesion is 100% stenosed.  Mid LM lesion is 80% stenosed.  Prox LAD lesion is 100% stenosed.  Ost 2nd Mrg to 2nd Mrg lesion is 100% stenosed.  2nd Mrg lesion is 99% stenosed.  Origin to Prox Graft lesion is 100% stenosed.  Prox Cx to Mid Cx lesion is 30% stenosed.  Mid Graft lesion is 30% stenosed.  Findings:  RA = 4  RV = 22/5 PA = 27/10 (16) PCW = 15 Fick cardiac output/index = 5.1/2.5 FA sat = 94% PA sat = 66%, 66%  Assessment: 1. Severe native CAD as above 2. LIMA to LAD patent 3. SVG to RCA occluded 4. SVG to OM-2 with 95% proximal lesion but no significant runoff vessel 5. EF 25% 6. Well compensated hemodynamics  Plan/Discussion:  Suspect EF is down mainly due to ETOH but does have unprotected AV-groove LCX which feeds colalterals to distal RCA. Would consider PCI of distal left main.   Echocardiogram 01/20/18: Study Conclusions  - Left ventricle: The cavity size was normal. Wall thickness was increased in a pattern of mild LVH. Systolic function was moderately to severely reduced. The estimated ejection fraction was in the range of 30% to 35%. Diffuse hypokinesis with inferior akinesis. Doppler parameters are consistent with pseudonormal (grade 2 diastolic dysfunction). LV filling pressure is elevated. - Aortic valve: Trileaflet. Sclerosis without stenosis. - Mitral valve: Mildly thickened leaflets . There was trivial regurgitation. - Left atrium: Severely dilated. Severely dilated. - Right ventricle: The cavity size was mildly dilated. Mildly reduced systolic function. - Right atrium: Severely dilated. - Tricuspid valve: There was moderate regurgitation. - Pulmonary arteries: PA peak pressure: 43 mm Hg (S). - Inferior vena cava: The  vessel was dilated. The respirophasic diameter changes were blunted (<50%), consistent with elevated central venous pressure.  Impressions:  - Compared with a prior study in 2018, the LVEF remains unchanged.  Patient Profile     57 y.o. male with history ofchronic combined HF (diagnosed 03/2017), HTN, HLD, CAD (s/p CABG 2010), alcohol abuse, ongoing tobacco use, prior cocaine exposure 2010 (pt states occurred unknowingly),remoteprior R CEA, stenting of L carotid 2014, PAD s/p failed attempt at stenting of the left common iliac artery 2014, CKD III, CVA, and 4.3cm AAA (2017) who presented to [facility] with c/o SOB and LE swelling found to have an elevated troponin.    Assessment & Plan    1. NSTEMI with known hx of CAD s/p CABG: -Pt presented to worsening SOB over the last several weeks. He has a long and complicated cardiac hx with most recent cath in 12/2017 which showed severe native CAD with occluded SVG to RCA and SVG to OM-2 with 95% proximal lesion with consideration for PCI of distal LM. This was scheduled and never completed secondary to pt cancellation. He has known carotid artery disease and has followed with Dr. Myra Gianotti who recommended angiographyand stenting with distal embolic protection of the recurrent stenosis in the right common carotid artery which was never completed.  -Trop elevated at 3.24>5.18>4.10>2.59>denies chest pain this AM  -Echocardiogram from 05/12/18 with further decreased LVEF, now down to 15% with diffuse hypokinesis with restrictive  filling; 4 chamber enlargement; moderate RV dysfunction; moderate TR and mild pulmonary hypertension -BNP on admission 2066 with s/s of acute HF exacerbation with SOB and LE swelling -D-dimer found to be 4.43>>VQ scan without PE  -Continue ASA, atorvastatin,  Plavix. Carvedilol on hold due to low cardiac output.  -Continue IV lasix  for fluid volume overload and monitor with daily BMET -Continue Hep gtt per pharmacy -Weight,  184lb>>down from 190lb on admission  -I&O, net negative 2 liters -Plan stent of left main stenosis once CHF improved and renal function stabilized. Will need continued follow up with interventional team to co-ordinate optimal timing for PCI.  2. Chronic combined systolic and diastolic CHF: -Last echocardiogram 7/20019 with LVEF of 35% with diffuse hypokinesis with inferior akinesis  and G2DD>>>now with further decrease in LV function to 15% --Pt reports intolerance to Entresto.  -Continue IV Lasix 40mg  twice daily for fluid volume overload and hold diuretics and ACE until renal function improved -Co ox down to 43. Plan to start IV milrinone per CHF team.  3. PAD: - critical RICA stenosis. Plan eventual carotid stenting per Dr Myra Gianotti. -Pt overdue for follow-up and never followed up for carotid disease with [facility].  -No show for LE vascular US  -Has c/o LE claudication   4. HTN: -Stable today,   5. HLD: -Statin restarted at last f/u visit in 01/2018 -Last LDL 159 on with goal of <70  6. Tobacco and marijuana use: -Cessation strongly encouraged   7. Lower back pain: -Reports chronic lower back pain with prolonged sitting>>>asking for NSAID and muscle relaxant -improved today.  8. Etoh abuse. Reports recent reduction in Etoh use. Monitor for withdrawal.   Signed, Rease Wence [place] MD, [facility]  05/14/2018, 9:09 AM     For questions or updates, please contact   Please consult [email or web address] for contact info under Cardiology/STEMI.

## 2018-05-14 NOTE — Progress Notes (Addendum)
Advanced Heart Failure Rounding Note  PCP-Cardiologist: No primary care provider on file.   Subjective:    Lasix increased to 80 mg IV BID yesterday. I/O negative 1.6 L. Weight down 4 lbs.   CVP 15-16. Coox 43%. Milrinone ordered. Not yet started.  Creatinine trending up 1.9 > 2.07. K 4.0.   Feels a little better today. Denies CP. Not SOB walking around room.   Objective:   Weight Range: 83.6 kg Body mass index is 25.69 kg/m.   Vital Signs:   Temp:  [97.2 F (36.2 C)-97.7 F (36.5 C)] 97.2 F (36.2 C) (11/15 0410) Pulse Rate:  [64-82] 82 (11/15 0410) Resp:  [18-20] 19 (11/14 2055) BP: (113-154)/(76-107) 154/103 (11/15 0410) SpO2:  [99 %-100 %] 100 % (11/15 0410) Weight:  [83.6 kg] 83.6 kg (11/15 0410) Last BM Date: 05/12/18  Weight change: Filed Weights   05/12/18 1548 05/13/18 0605 05/14/18 0410  Weight: 85.9 kg 85.5 kg 83.6 kg    Intake/Output:   Intake/Output Summary (Last 24 hours) at 05/14/2018 0857 Last data filed at 05/14/2018 0457 Gross per 24 hour  Intake 840 ml  Output 2450 ml  Net -1610 ml      Physical Exam    General:  Appears fatigued. No resp difficulty HEENT: Normal Neck: Supple. JVP elevated to jaw  Carotids 2+ bilat; no bruits. No lymphadenopathy or thyromegaly appreciated. Cor: PMI laterally displaced. Regular rate & rhythm. +s3 Lungs: Clear No wheeze Abdomen: Soft, nontender, nondistended. No hepatosplenomegaly. No bruits or masses. Good bowel sounds. Extremities: No cyanosis, clubbing, rash, BLE 2+ edema. Neuro: alert & oriented x 3, cranial nerves grossly intact. moves all 4 extremities w/o difficulty. Affect pleasant   Telemetry   NSR 80s. Personally reviewed.   EKG    No new tracings.  Labs    CBC Recent Labs    05/13/18 0331 05/14/18 0423  WBC 8.6 7.1  HGB 12.9* 13.2  HCT 41.2 42.1  MCV 87.1 87.3  PLT 195 207   Basic Metabolic Panel Recent Labs    70/35/00 0331 05/14/18 0423  NA 134* 134*  K 4.3 4.0   CL 103 97*  CO2 20* 26  GLUCOSE 119* 118*  BUN 28* 26*  CREATININE 1.93* 2.07*  CALCIUM 8.6* 8.7*   Liver Function Tests Recent Labs    05/12/18 1623  AST 46*  ALT 36  ALKPHOS 110  BILITOT 1.4*  PROT 5.8*  ALBUMIN 3.2*   No results for input(s): LIPASE, AMYLASE in the last 72 hours. Cardiac Enzymes Recent Labs    05/12/18 1623 05/12/18 2211 05/13/18 0331  TROPONINI 5.18* 4.10* 2.59*    BNP: BNP (last 3 results) Recent Labs    05/12/18 1035  BNP 2,066.0*    ProBNP (last 3 results) No results for input(s): PROBNP in the last 8760 hours.   D-Dimer Recent Labs    05/12/18 1035  DDIMER 4.43*   Hemoglobin A1C Recent Labs    05/12/18 1623  HGBA1C 5.8*   Fasting Lipid Panel Recent Labs    05/13/18 0331  CHOL 204*  HDL 31*  LDLCALC 159*  TRIG 68  CHOLHDL 6.6   Thyroid Function Tests No results for input(s): TSH, T4TOTAL, T3FREE, THYROIDAB in the last 72 hours.  Invalid input(s): FREET3  Other results:   Imaging    [place] Ekg Site Rite  Result Date: 05/13/2018 If Site Rite image not attached, placement could not be confirmed due to current cardiac rhythm.  Medications:     Scheduled Medications: . aspirin EC  81 mg Oral Daily  . atorvastatin  80 mg Oral QPM  . citalopram  20 mg Oral Daily  . clopidogrel  75 mg Oral Q breakfast  . enoxaparin (LOVENOX) injection  40 mg Subcutaneous QHS  . ezetimibe  10 mg Oral Daily  . furosemide  80 mg Intravenous BID  . sodium chloride flush  10-40 mL Intracatheter Q12H     Infusions: . milrinone       PRN Medications:  acetaminophen, albuterol, cyclobenzaprine, nitroGLYCERIN, ondansetron (ZOFRAN) IV, sodium chloride flush    Patient Profile   Randy [name] a 57 y.o.malewith history of combined HF (diagnosed 03/2017), HTN, HLD, CAD s/p CABG2010 and PCI (last ~2014), former tobacco use, CVA, and AAA.  Assessment/Plan   1. Acute on chronic systolic HF.Echo in 2017 showed EF  55% -Echo 03/2017: EF 30-35% with grade 2 DD. - Echo 01/20/18: EF 30-35%, grade 2 DD, RV mildly reduced. - Echo 05/12/18: EF 15%, diffuse HK, LA severely dilated, RV moderately reduced, RA moderately dilated, mod TR, PA peak pressure 44 mm Hg - Volume remains elevated. CVP 15-16.  - Coox 43%. Start milrinone 0.25 mcg/kg/min. - Continue lasix 80 mg IV BID. Start K 20 meq BID.  - No coreg with low output. - ACE and spiro on hold with AKI. Did not tolerate with Entresto with vomiting after each dose. - Hold off on dig with AKI.  - May need to add hydral/imdur if need for hypertension. Expect to improve with milrinone and diuresis. - Add TED hose.  2. CAD s/p CABG 2010 -> NSTEMI - LHC 12/29/17 showed patent LIMA to LAD, SVG to RCA occluded, SVG to OM-2 with 95% prox lesion. Has unprotected AV-groove LCX which feeds collaterals to distal RCA. Interventional team consulted for ?PCI of distal left main. - Troponin 5.18 > 4.10 > 2.59 - No s/s ischemia.  - Continue ASA, plavix, statin. No longer on heparin. Lovenox for VTE. - Will need PCI once optimized. Per Dr Elvis Coil note, it sounds like this now takes priority over CEA now.    3. Carotid stenosis - Carotid US 04/14/16: Right ICA 80-99%, Left ICA upper range <50% vs low range >50%. - Needs right CEA at some point. [facility]VS to see him this admission per patient.    4. HTN - SBP 110-130s generally. Starting milrinone as above.   5. Acute on CKD - Baseline ~1.2-1.4 - Creatinine 1.93 > 2.07  6. ?OSA - Needs sleep study outpatient. No change.   7. Alcohol abuse - No ETOH in 3 weeks. Was drinking 1 6 pack beer/week prior.  8. Hyperlipidemia - LDL 159. Continue statin.     Medication concerns reviewed with patient and pharmacy team. Barriers identified: compliance with follow up.    Length of Stay: 1  Alford Highland, NP  05/14/2018, 8:57 AM  Advanced Heart Failure Team Pager [phone] (M-F; 7a - 4p)  Please contact [facility] for night-coverage after hours (4p -7a ) and weekends on [email or web address]  Patient seen and examined with the above-signed Advanced Practice Provider and/or Housestaff. I personally reviewed laboratory data, imaging studies and relevant notes. I independently examined the patient and formulated the important aspects of the plan. I have edited the note to reflect any of my changes or salient points. I have personally discussed the plan with the patient and/or family.  He has severe biventricular dysfunction with EF 10-15%. PICC placed. CVP elevated  and co-ox low. Creatinine getting worse. Will stop IV milrinone. Once HF improved with need LM PCI. Ok to stop heparin as no current anginal sx. Watch rhythm on milrinone.    Arvilla Meres, MD  12:28 PM

## 2018-05-14 NOTE — Progress Notes (Signed)
05/14/2018 1530 Assumed care of pt.  Received report from [facility].  Pt is sitting up in chair, states he is cold, temperature in room checked.  No other C/O voiced.  Ted hose placed.  Call bell in reach. Kathryne Hitch

## 2018-05-15 LAB — BASIC METABOLIC PANEL
ANION GAP: 8 (ref 5–15)
BUN: 22 mg/dL — ABNORMAL HIGH (ref 6–20)
CHLORIDE: 94 mmol/L — AB (ref 98–111)
CO2: 31 mmol/L (ref 22–32)
Calcium: 8.7 mg/dL — ABNORMAL LOW (ref 8.9–10.3)
Creatinine, Ser: 1.79 mg/dL — ABNORMAL HIGH (ref 0.61–1.24)
GFR calc Af Amer: 46 mL/min — ABNORMAL LOW (ref >60)
GFR calc non Af Amer: 40 mL/min — ABNORMAL LOW (ref >60)
GLUCOSE: 126 mg/dL — AB (ref 70–99)
POTASSIUM: 3.6 mmol/L (ref 3.5–5.1)
Sodium: 133 mmol/L — ABNORMAL LOW (ref 135–145)

## 2018-05-15 LAB — COOXEMETRY PANEL
Carboxyhemoglobin: 1.4 % (ref 0.5–1.5)
METHEMOGLOBIN: 1 % (ref 0.0–1.5)
O2 SAT: 55 %
Total hemoglobin: 13.6 g/dL (ref 12.0–16.0)

## 2018-05-15 MED ORDER — POTASSIUM CHLORIDE CRYS ER 20 MEQ PO TBCR
40.0000 meq | EXTENDED_RELEASE_TABLET | Freq: Two times a day (BID) | ORAL | Status: DC
  Administered 2018-05-15 – 2018-05-21 (×11): 40 meq via ORAL
  Filled 2018-05-15 (×11): qty 2

## 2018-05-15 MED ORDER — SPIRONOLACTONE 12.5 MG HALF TABLET
12.5000 mg | ORAL_TABLET | Freq: Every day | ORAL | Status: DC
  Administered 2018-05-15 – 2018-05-16 (×2): 12.5 mg via ORAL
  Filled 2018-05-15 (×3): qty 1

## 2018-05-15 NOTE — Progress Notes (Signed)
error 

## 2018-05-15 NOTE — Progress Notes (Signed)
Advanced Heart Failure Rounding Note  PCP-Cardiologist: No primary care provider on file.   Subjective:    Milrinone 0.25 started on 11/15 for co-ox 43% and worsening creatinine.   Feels much better. Co-ox 55%. Diuresing well. Weight down another 5 pounds overnight. (9 pounds total).   Breathing better. No CP. No orthopnea or PND.  Creatinine improving 1.9 > 2.07 -> 1.8. K 3.6    Objective:   Weight Range: 81.2 kg Body mass index is 24.97 kg/m.   Vital Signs:   Temp:  [96.8 F (36 C)-98.5 F (36.9 C)] 96.8 F (36 C) (11/16 0835) Pulse Rate:  [84-94] 94 (11/16 0835) Resp:  [16-23] 21 (11/16 0835) BP: (145-160)/(94-107) 149/97 (11/16 0835) SpO2:  [98 %-100 %] 99 % (11/16 0835) Weight:  [81.2 kg] 81.2 kg (11/16 0504) Last BM Date: 05/15/18  Weight change: Filed Weights   05/13/18 0605 05/14/18 0410 05/15/18 0504  Weight: 85.5 kg 83.6 kg 81.2 kg    Intake/Output:   Intake/Output Summary (Last 24 hours) at 05/15/2018 1134 Last data filed at 05/15/2018 0501 Gross per 24 hour  Intake 443.87 ml  Output 3925 ml  Net -3481.13 ml      Physical Exam    General:  Sitting in chair No resp difficulty HEENT: normal Neck: supple. JVP 9-10. Carotids 2+ bilat; no bruits. No lymphadenopathy or thryomegaly appreciated. Cor: PMI laterally displaced. Regular rate & rhythm. +s3 Lungs: clear Abdomen: soft, nontender, nondistended. No hepatosplenomegaly. No bruits or masses. Good bowel sounds. Extremities: no cyanosis, clubbing, rash, 1-2+ edema Neuro: alert & orientedx3, cranial nerves grossly intact. moves all 4 extremities w/o difficulty. Affect pleasant   Telemetry   NSR 80-90s. Personally reviewed.   EKG    No new tracings.  Labs    CBC Recent Labs    05/13/18 0331 05/14/18 0423  WBC 8.6 7.1  HGB 12.9* 13.2  HCT 41.2 42.1  MCV 87.1 87.3  PLT 195 207   Basic Metabolic Panel Recent Labs    43/15/40 0423 05/15/18 0507  NA 134* 133*  K 4.0 3.6    CL 97* 94*  CO2 26 31  GLUCOSE 118* 126*  BUN 26* 22*  CREATININE 2.07* 1.79*  CALCIUM 8.7* 8.7*   Liver Function Tests Recent Labs    05/12/18 1623  AST 46*  ALT 36  ALKPHOS 110  BILITOT 1.4*  PROT 5.8*  ALBUMIN 3.2*   No results for input(s): LIPASE, AMYLASE in the last 72 hours. Cardiac Enzymes Recent Labs    05/12/18 1623 05/12/18 2211 05/13/18 0331  TROPONINI 5.18* 4.10* 2.59*    BNP: BNP (last 3 results) Recent Labs    05/12/18 1035  BNP 2,066.0*    ProBNP (last 3 results) No results for input(s): PROBNP in the last 8760 hours.   D-Dimer No results for input(s): DDIMER in the last 72 hours. Hemoglobin A1C Recent Labs    05/12/18 1623  HGBA1C 5.8*   Fasting Lipid Panel Recent Labs    05/13/18 0331  CHOL 204*  HDL 31*  LDLCALC 159*  TRIG 68  CHOLHDL 6.6   Thyroid Function Tests No results for input(s): TSH, T4TOTAL, T3FREE, THYROIDAB in the last 72 hours.  Invalid input(s): FREET3  Other results:   Imaging    No results found.   Medications:     Scheduled Medications: . aspirin EC  81 mg Oral Daily  . atorvastatin  80 mg Oral QPM  . citalopram  20 mg Oral Daily  .  clopidogrel  75 mg Oral Q breakfast  . enoxaparin (LOVENOX) injection  40 mg Subcutaneous QHS  . ezetimibe  10 mg Oral Daily  . furosemide  80 mg Intravenous BID  . potassium chloride  20 mEq Oral BID  . sodium chloride flush  10-40 mL Intracatheter Q12H    Infusions: . milrinone 0.25 mcg/kg/min (05/14/18 2208)    PRN Medications: acetaminophen, albuterol, cyclobenzaprine, nitroGLYCERIN, ondansetron (ZOFRAN) IV, sodium chloride flush    Patient Profile   Randy [name] a 57 y.o.malewith history of combined HF (diagnosed 03/2017), HTN, HLD, CAD s/p CABG2010 and PCI (last ~2014), former tobacco use, CVA, and AAA.  Assessment/Plan   1. Acute on chronic systolic HF.Echo in 2017 showed EF 55% -Echo 03/2017: EF 30-35% with grade 2 DD. - Echo  01/20/18: EF 30-35%, grade 2 DD, RV mildly reduced. - Echo 05/12/18: EF 15%, diffuse HK, LA severely dilated, RV moderately reduced, RA moderately dilated, mod TR, PA peak pressure 44 mm Hg - Milrinone 0.25 started on 11/15 for co-ox 43% and worsening creatinine.  - Feeling better. Co-ox 55%. Diuresing well. Weight down another 5 pounds overnight. (9 pounds total).  - Continue lasix 80 mg IV BID.  - Supp K - No coreg with low output. - ACE and spiro on hold with AKI. Did not tolerate with Entresto with vomiting after each dose. - Hold off on dig with AKI.  - Continue TED hose.  2. CAD s/p CABG 2010 -> NSTEMI - LHC 12/29/17 showed patent LIMA to LAD, SVG to RCA occluded, SVG to OM-2 with 95% prox lesion. Has unprotected AV-groove LCX which feeds collaterals to distal RCA. Interventional team consulted for ?PCI of distal left main. - Troponin 5.18 > 4.10 > 2.59 - No CP currently  - Continue ASA, plavix, statin. No longer on heparin. Lovenox for VTE. - Will need PCI LM/LCX once HF optimized - likely early to mid next week. Per Dr Elvis Coil note, it sounds like this now takes priority over CEA now.    3. Carotid stenosis - Carotid US 04/14/16: Right ICA 80-99%, Left ICA upper range <50% vs low range >50%. - Needs right CEA at some point. VVS to see him this admission per patient.    4. HTN - SBP 110-130s generally. Starting milrinone as above.   5. Acute on CKD - Baseline ~1.2-1.4 - Creatinine 1.93 > 2.07 -> 1.8  6. ?OSA - Needs sleep study outpatient. No change.   7. Alcohol abuse - No ETOH in 3 weeks. Was drinking 1 6 pack beer/week prior.  8. Hyperlipidemia - LDL 159. Continue statin.     Medication concerns reviewed with patient and pharmacy team. Barriers identified: compliance with follow up.    Length of Stay: 2  Arvilla Meres, MD  05/15/2018, 11:34 AM  Advanced Heart Failure Team Pager [phone] (M-F; 7a - 4p)  Please contact [facility] for  night-coverage after hours (4p -7a ) and weekends on [email or web address]

## 2018-05-15 NOTE — Plan of Care (Signed)
  Problem: Education: Goal: Knowledge of General Education information will improve Description Including pain rating scale, medication(s)/side effects and non-pharmacologic comfort measures Outcome: Progressing   Problem: Health Behavior/Discharge Planning: Goal: Ability to manage health-related needs will improve Outcome: Progressing   Problem: Clinical Measurements: Goal: Will remain free from infection 05/15/2018 0059 by Jill Side, RN Outcome: Progressing 05/14/2018 2324 by Jill Side, RN Outcome: Progressing   Problem: Nutrition: Goal: Adequate nutrition will be maintained Outcome: Progressing   Problem: Safety: Goal: Ability to remain free from injury will improve Outcome: Progressing   Problem: Safety: Goal: Ability to remain free from injury will improve Outcome: Progressing   Problem: Education: Goal: Knowledge of disease or condition will improve Outcome: Not Progressing

## 2018-05-16 LAB — BASIC METABOLIC PANEL
ANION GAP: 10 (ref 5–15)
BUN: 19 mg/dL (ref 6–20)
CHLORIDE: 91 mmol/L — AB (ref 98–111)
CO2: 32 mmol/L (ref 22–32)
Calcium: 8.9 mg/dL (ref 8.9–10.3)
Creatinine, Ser: 1.63 mg/dL — ABNORMAL HIGH (ref 0.61–1.24)
GFR calc Af Amer: 52 mL/min — ABNORMAL LOW (ref >60)
GFR calc non Af Amer: 45 mL/min — ABNORMAL LOW (ref >60)
GLUCOSE: 115 mg/dL — AB (ref 70–99)
Potassium: 3.6 mmol/L (ref 3.5–5.1)
Sodium: 133 mmol/L — ABNORMAL LOW (ref 135–145)

## 2018-05-16 LAB — COOXEMETRY PANEL
CARBOXYHEMOGLOBIN: 1.3 % (ref 0.5–1.5)
METHEMOGLOBIN: 1.7 % — AB (ref 0.0–1.5)
O2 SAT: 57.3 %
Total hemoglobin: 13.4 g/dL (ref 12.0–16.0)

## 2018-05-16 MED ORDER — SPIRONOLACTONE 25 MG PO TABS
25.0000 mg | ORAL_TABLET | Freq: Every day | ORAL | Status: DC
  Administered 2018-05-17 – 2018-05-21 (×4): 25 mg via ORAL
  Filled 2018-05-16 (×4): qty 1

## 2018-05-16 MED ORDER — POTASSIUM CHLORIDE CRYS ER 20 MEQ PO TBCR
40.0000 meq | EXTENDED_RELEASE_TABLET | Freq: Once | ORAL | Status: AC
  Administered 2018-05-16: 40 meq via ORAL
  Filled 2018-05-16: qty 2

## 2018-05-16 MED ORDER — ISOSORBIDE MONONITRATE ER 30 MG PO TB24
30.0000 mg | ORAL_TABLET | Freq: Every day | ORAL | Status: DC
  Administered 2018-05-16 – 2018-05-21 (×5): 30 mg via ORAL
  Filled 2018-05-16 (×5): qty 1

## 2018-05-16 MED ORDER — HYDRALAZINE HCL 25 MG PO TABS
25.0000 mg | ORAL_TABLET | Freq: Three times a day (TID) | ORAL | Status: DC
  Administered 2018-05-16 – 2018-05-17 (×3): 25 mg via ORAL
  Filled 2018-05-16 (×3): qty 1

## 2018-05-16 NOTE — Progress Notes (Addendum)
Advanced Heart Failure Rounding Note  PCP-Cardiologist: No primary care provider on file.   Subjective:    Milrinone 0.25 started on 11/15 for co-ox 43% and worsening creatinine.   Great diuresis with IV lasix with I/O -2.9 L. Weight down 5 more lbs (24 lbs total). Co-ox 58% CVP 8.  Started on hydral/imdur yesterday. SBP 130-150s.   Creatinine improving 1.9 > 2.07 -> 1.8 -> 1.6 -> 1.57. K 4.2  Denies SOB or dizziness. No CP, orthopnea or PND. Walking halls. Feeling a lot better. Unsure of any plans for PCI yet.  Objective:   Weight Range: 77.7 kg Body mass index is 23.88 kg/m.   Vital Signs:   Temp:  [97.5 F (36.4 C)-98.1 F (36.7 C)] 97.6 F (36.4 C) (11/17 0739) Pulse Rate:  [88-93] 88 (11/17 0739) Resp:  [17-26] 20 (11/17 0739) BP: (113-155)/(86-107) 155/106 (11/17 0739) SpO2:  [95 %-98 %] 97 % (11/17 0739) Weight:  [77.7 kg] 77.7 kg (11/17 0500) Last BM Date: 05/15/18  Weight change: Filed Weights   05/14/18 0410 05/15/18 0504 05/16/18 0500  Weight: 83.6 kg 81.2 kg 77.7 kg    Intake/Output:   Intake/Output Summary (Last 24 hours) at 05/16/2018 1317 Last data filed at 05/16/2018 1228 Gross per 24 hour  Intake 1621.64 ml  Output 4925 ml  Net -3303.36 ml      Physical Exam    General:  Sitting in chair. No resp difficulty HEENT: normal anicteric  Neck: supple. JVP 8. Carotids 2+ bilat; no bruits. No lymphadenopathy or thryomegaly appreciated. Cor: PMI laterally displaced. Regular rate & rhythm. +s3 Lungs: clear no wheeze  Abdomen: soft, nontender, nondistended. No hepatosplenomegaly. No bruits or masses. Good bowel sounds. Extremities: no cyanosis, clubbing, rash, edema + PICC Neuro: alert & oriented x 3, cranial nerves grossly intact. moves all 4 extremities w/o difficulty. Affect pleasant  Telemetry   NSR 80-90s. Personally reviewed.   EKG    No new tracings.  Labs    CBC Recent Labs    05/14/18 0423  WBC 7.1  HGB 13.2  HCT 42.1    MCV 87.3  PLT 207   Basic Metabolic Panel Recent Labs    16/10/96 0507 05/16/18 0430  NA 133* 133*  K 3.6 3.6  CL 94* 91*  CO2 31 32  GLUCOSE 126* 115*  BUN 22* 19  CREATININE 1.79* 1.63*  CALCIUM 8.7* 8.9   Liver Function Tests No results for input(s): AST, ALT, ALKPHOS, BILITOT, PROT, ALBUMIN in the last 72 hours. No results for input(s): LIPASE, AMYLASE in the last 72 hours. Cardiac Enzymes No results for input(s): CKTOTAL, CKMB, CKMBINDEX, TROPONINI in the last 72 hours.  BNP: BNP (last 3 results) Recent Labs    05/12/18 1035  BNP 2,066.0*    ProBNP (last 3 results) No results for input(s): PROBNP in the last 8760 hours.   D-Dimer No results for input(s): DDIMER in the last 72 hours. Hemoglobin A1C No results for input(s): HGBA1C in the last 72 hours. Fasting Lipid Panel No results for input(s): CHOL, HDL, LDLCALC, TRIG, CHOLHDL, LDLDIRECT in the last 72 hours. Thyroid Function Tests No results for input(s): TSH, T4TOTAL, T3FREE, THYROIDAB in the last 72 hours.  Invalid input(s): FREET3  Other results:   Imaging    No results found.   Medications:     Scheduled Medications: . aspirin EC  81 mg Oral Daily  . atorvastatin  80 mg Oral QPM  . citalopram  20 mg Oral Daily  .  clopidogrel  75 mg Oral Q breakfast  . enoxaparin (LOVENOX) injection  40 mg Subcutaneous QHS  . ezetimibe  10 mg Oral Daily  . furosemide  80 mg Intravenous BID  . potassium chloride  40 mEq Oral BID  . sodium chloride flush  10-40 mL Intracatheter Q12H  . spironolactone  12.5 mg Oral Daily    Infusions: . milrinone 0.25 mcg/kg/min (05/16/18 0600)    PRN Medications: acetaminophen, albuterol, cyclobenzaprine, nitroGLYCERIN, ondansetron (ZOFRAN) IV, sodium chloride flush    Patient Profile   Randy [name] a 57 y.o.malewith history of combined HF (diagnosed 03/2017), HTN, HLD, CAD s/p CABG2010 and PCI (last ~2014), former tobacco use, CVA, and  AAA.  Assessment/Plan   1. Acute on chronic systolic HF.Echo in 2017 showed EF 55% -Echo 03/2017: EF 30-35% with grade 2 DD. - Echo 01/20/18: EF 30-35%, grade 2 DD, RV mildly reduced. - Echo 05/12/18: EF 15%, diffuse HK, LA severely dilated, RV moderately reduced, RA moderately dilated, mod TR, PA peak pressure 44 mm Hg - Milrinone 0.25 started on 11/15 for co-ox 43% and worsening creatinine.  - Volume much improved. CVP 8. Coox 58% on milrinone 0.25 mcg/kg/min.  - Stop IV lasix. Start lasix 40 mg daily.  - No coreg with low output. - Continue spiro 25 mg daily - Increase hydralazine to 50 mg TID. Continue imdur 30 mg daily. SBP 130-150s - ACE on hold with AKI. Did not tolerate with Entresto with vomiting after each dose. - Hold off on dig with AKI.   2. CAD s/p CABG 2010 -> NSTEMI - LHC 12/29/17 showed patent LIMA to LAD, SVG to RCA occluded, SVG to OM-2 with 95% prox lesion. Has unprotected AV-groove LCX which feeds collaterals to distal RCA. Interventional team consulted for ?PCI of distal left main. - Troponin 5.18 > 4.10 > 2.59 - No CP.  - Continue ASA, plavix, statin. No longer on heparin. Lovenox for VTE. - Will need PCI LM/LCX once HF optimized - likely early to midweek. Per Dr Elvis Coil note, it sounds like this now takes priority over CEA now.  Unsure of timing.   3. Carotid stenosis - Carotid US 04/14/16: Right ICA 80-99%, Left ICA upper range <50% vs low range >50%. - Asymptomatic. No change.  - Needs right CEA at some point. VVS to see him this admission per patient.    4. HTN - SBP 130-150s. Increase hydralazine.    5. Acute on CKD - Baseline ~1.2-1.4 - Creatinine 1.93 > 2.07 -> 1.8 -> 1.6 -> 1.57 with milrinone support  6. ?OSA - Needs sleep study outpatient. No change.   7. Alcohol abuse - No ETOH in 3 weeks. Was drinking 1 6 pack beer/week prior. No change.   8. Hyperlipidemia - LDL 159. Continue statin. No change.     Medication concerns reviewed  with patient and pharmacy team. Barriers identified: compliance with follow up.    Length of Stay: 3  Arvilla Meres, MD  05/16/2018, 1:17 PM  Advanced Heart Failure Team Pager [phone] (M-F; 7a - 4p)  Please contact [facility] for night-coverage after hours (4p -7a ) and weekends on [email or web address]  Patient seen and examined with the above-signed Advanced Practice Provider and/or Housestaff. I personally reviewed laboratory data, imaging studies and relevant notes. I independently examined the patient and formulated the important aspects of the plan. I have edited the note to reflect any of my changes or salient points. I have personally discussed the plan with the patient and/or  family.  Much improved with milrinone and high-volume diuresis. No ongoing evidence of ischemia. Agree with stopping IV lasix. Continue milrinone. Will discuss timing of PCI LM/LCX with interventional team. Hopefully tomorrow. Will also need to consult VVS to discuss carotid revascularization. Continue DAPT and statin.   Arvilla Meres, MD  8:44 AM

## 2018-05-17 ENCOUNTER — Inpatient Hospital Stay (HOSPITAL_COMMUNITY): Payer: Medicaid Other

## 2018-05-17 ENCOUNTER — Other Ambulatory Visit: Payer: Self-pay

## 2018-05-17 DIAGNOSIS — I6529 Occlusion and stenosis of unspecified carotid artery: Secondary | ICD-10-CM

## 2018-05-17 DIAGNOSIS — I6521 Occlusion and stenosis of right carotid artery: Secondary | ICD-10-CM

## 2018-05-17 LAB — BASIC METABOLIC PANEL
Anion gap: 10 (ref 5–15)
BUN: 23 mg/dL — ABNORMAL HIGH (ref 6–20)
CALCIUM: 9 mg/dL (ref 8.9–10.3)
CHLORIDE: 90 mmol/L — AB (ref 98–111)
CO2: 30 mmol/L (ref 22–32)
CREATININE: 1.57 mg/dL — AB (ref 0.61–1.24)
GFR calc non Af Amer: 47 mL/min — ABNORMAL LOW (ref >60)
GFR, EST AFRICAN AMERICAN: 54 mL/min — AB (ref >60)
Glucose, Bld: 114 mg/dL — ABNORMAL HIGH (ref 70–99)
Potassium: 4.2 mmol/L (ref 3.5–5.1)
Sodium: 130 mmol/L — ABNORMAL LOW (ref 135–145)

## 2018-05-17 LAB — COOXEMETRY PANEL
Carboxyhemoglobin: 1.4 % (ref 0.5–1.5)
Methemoglobin: 1.5 % (ref 0.0–1.5)
O2 SAT: 57.9 %
Total hemoglobin: 14.5 g/dL (ref 12.0–16.0)

## 2018-05-17 MED ORDER — SODIUM CHLORIDE 0.9% FLUSH: 3.0000 mL | Freq: Two times a day (BID) | INTRAVENOUS | Status: DC

## 2018-05-17 MED ORDER — SODIUM CHLORIDE 0.9 % IV SOLN: 250.0000 mL | INTRAVENOUS | Status: DC | PRN

## 2018-05-17 MED ORDER — FUROSEMIDE 40 MG PO TABS
40.0000 mg | ORAL_TABLET | Freq: Every day | ORAL | Status: DC
  Administered 2018-05-17 – 2018-05-21 (×4): 40 mg via ORAL
  Filled 2018-05-17 (×4): qty 1

## 2018-05-17 MED ORDER — HYDRALAZINE HCL 50 MG PO TABS
50.0000 mg | ORAL_TABLET | Freq: Three times a day (TID) | ORAL | Status: DC
  Administered 2018-05-17 – 2018-05-20 (×8): 50 mg via ORAL
  Filled 2018-05-17 (×8): qty 1

## 2018-05-17 MED ORDER — SODIUM CHLORIDE 0.9% FLUSH: 3.0000 mL | INTRAVENOUS | Status: DC | PRN

## 2018-05-17 MED ORDER — SODIUM CHLORIDE 0.9 % IV SOLN
INTRAVENOUS | Status: DC
  Administered 2018-05-18: 06:00:00 via INTRAVENOUS

## 2018-05-17 MED ORDER — ASPIRIN 81 MG PO CHEW
81.0000 mg | CHEWABLE_TABLET | ORAL | Status: AC
  Administered 2018-05-18: 81 mg via ORAL
  Filled 2018-05-17: qty 1

## 2018-05-17 NOTE — Consult Note (Addendum)
[facility] NOTE   MRN : [id number]  Reason for Consult: Right carotid stenosis Referring Physician: Duwaine Maxin NP  History of Present Illness: 57 y/o male is admitted pending cardiac cath for one week history of worsening dyspnea, orthopnea, LE edema and increased abdominal girth.  Work up revealed right carotid stenosis on 04/14/2016 carotid duplex revealed right ICA 80-99% stenosis.  He does have hx of CABG (Dr. Laneta Simmers) on 4/14/ 2010 in conjunction with an open right carotid endarterectomy (Dr. Myra Gianotti) on 10/11/08.  On 01/13/13, he underwent exposure of left carotid artery and aborted left carotid endarterectomy due to calcification extending up to the skull base.  On 02/22/13, he underwent stenting of the left carotid artery.  During his last hospitalization, he was found to have a recurrent right carotid artery stenosis of greater than 80%.  Dr. Myra Gianotti planned on angiography with stent placement.  The Patein's daughter cancelled the cae due to being out of town.      We have been consulted for possible vascular intervention for right carotid stenosis.       Current Facility-Administered Medications  Medication Dose Route Frequency Provider Last Rate Last Dose  . 0.9 %  sodium chloride infusion  250 mL Intravenous PRN Alford Highland, NP      . Melene Muller ON 05/18/2018] 0.9 %  sodium chloride infusion   Intravenous Continuous Alford Highland, NP      . acetaminophen (TYLENOL) tablet 650 mg  650 mg Oral Q4H PRN Alford Highland, NP   650 mg at 05/13/18 1600  . albuterol (PROVENTIL) (2.5 MG/3ML) 0.083% nebulizer solution 2.5 mg  2.5 mg Nebulization Q6H PRN Alford Highland, NP      . Melene Muller ON 05/18/2018] aspirin chewable tablet 81 mg  81 mg Oral Nicholes Mango, NP      . aspirin EC tablet 81 mg  81 mg Oral Daily Alford Highland, NP   81 mg at 05/17/18 0905  . atorvastatin (LIPITOR) tablet 80 mg  80 mg Oral QPM Alford Highland, NP   80 mg at  05/16/18 1748  . citalopram (CELEXA) tablet 20 mg  20 mg Oral Daily Alford Highland, NP   20 mg at 05/17/18 0905  . clopidogrel (PLAVIX) tablet 75 mg  75 mg Oral Q breakfast Alford Highland, NP   75 mg at 05/17/18 4098  . cyclobenzaprine (FLEXERIL) tablet 5 mg  5 mg Oral TID PRN Alford Highland, NP   5 mg at 05/14/18 2201  . enoxaparin (LOVENOX) injection 40 mg  40 mg Subcutaneous QHS Silvana Newness, RPH   40 mg at 05/16/18 2114  . ezetimibe (ZETIA) tablet 10 mg  10 mg Oral Daily Alford Highland, NP   10 mg at 05/17/18 1191  . furosemide (LASIX) tablet 40 mg  40 mg Oral Daily Alford Highland, NP   40 mg at 05/17/18 0905  . hydrALAZINE (APRESOLINE) tablet 50 mg  50 mg Oral Q8H Alford Highland, NP   50 mg at 05/17/18 1311  . isosorbide mononitrate (IMDUR) 24 hr tablet 30 mg  30 mg Oral Daily Bensimhon, Bevelyn Buckles, MD   30 mg at 05/17/18 0905  . milrinone (PRIMACOR) 20 MG/100 ML (0.2 mg/mL) infusion  0.25 mcg/kg/min Intravenous Continuous Bensimhon, Bevelyn Buckles, MD 6.27 mL/hr at 05/17/18 1315 0.25 mcg/kg/min at 05/17/18 1315  . nitroGLYCERIN (NITROSTAT) SL tablet 0.4 mg  0.4 mg Sublingual Q5 Min  x 3 PRN Alford Highland, NP      . ondansetron [facility]) injection 4 mg  4 mg Intravenous Q6H PRN Alford Highland, NP   4 mg at 05/13/18 0536  . potassium chloride SA (K-DUR,KLOR-CON) CR tablet 40 mEq  40 mEq Oral BID Bensimhon, Bevelyn Buckles, MD   40 mEq at 05/17/18 0905  . sodium chloride flush (NS) 0.9 % injection 10-40 mL  10-40 mL Intracatheter Q12H [place], Peter M, MD   10 mL at 05/17/18 1144  . sodium chloride flush (NS) 0.9 % injection 10-40 mL  10-40 mL Intracatheter PRN [place], Peter M, MD   10 mL at 05/14/18 1610  . sodium chloride flush (NS) 0.9 % injection 3 mL  3 mL Intravenous Q12H Alford Highland, NP      . sodium chloride flush (NS) 0.9 % injection 3 mL  3 mL Intravenous PRN Alford Highland, NP      . spironolactone (ALDACTONE) tablet 25 mg  25 mg Oral Daily Bensimhon, Bevelyn Buckles, MD   25 mg at 05/17/18  0904    Pt meds include: Statin :Yes Betablocker: Yes ASA: Yes Other anticoagulants/antiplatelets: none  Past Medical History:  Diagnosis Date  . AAA (abdominal aortic aneurysm) [facility]) July 2013  . Alcohol abuse   . Anxiety   . CAD (coronary artery disease)    a. s/p CABG 2010.  . Carotid artery occlusion    a. prior R CEA, stenting of L carotid 2014. b. Severe carotid restenosis in 2017 -> planned for surgery but pt did not f/u.  [name] Chronic combined systolic and diastolic CHF (congestive heart failure) (HCC)   . GERD (gastroesophageal reflux disease)   . Heart attack (HCC) 2010  . Hx of CABG 2010  . Hyperlipidemia   . Hypertension   . Lung nodule   . Polysubstance abuse (HCC)    a. mention of cocaine positivity in 2010 with patient stating unknowingly exposed, also EtOH.  [name] PVD (peripheral vascular disease) (HCC)    a. PAD s/p failed attempt at stenting of the left common iliac artery 2014.  . Stroke (HCC)   . Uncontrolled hypertension 02/02/2012    Past Surgical History:  Procedure Laterality Date  . ABDOMINAL AORTAGRAM N/A 04/19/2013   Procedure: ABDOMINAL AORTAGRAM;  Surgeon: Nada Libman, MD;  Location: [facility] CATH LAB;  Service: Cardiovascular;  Laterality: N/A;  . CAROTID ENDARTERECTOMY Left 01-13-13   Attempted cea  . CAROTID STENT INSERTION Left 02/22/2013   Procedure: CAROTID STENT INSERTION;  Surgeon: Nada Libman, MD;  Location: [facility] CATH LAB;  Service: Cardiovascular;  Laterality: Left;  . CORONARY ARTERY BYPASS GRAFT  2010  . ENDARTERECTOMY Left 01/13/2013   Procedure: ATTEMPTED ENDARTERECTOMY CAROTID;  Surgeon: Nada Libman, MD;  Location: [facility] OR;  Service: Vascular;  Laterality: Left;  . RIGHT/LEFT HEART CATH AND CORONARY/GRAFT ANGIOGRAPHY N/A 12/29/2017   Procedure: RIGHT/LEFT HEART CATH AND CORONARY/GRAFT ANGIOGRAPHY;  Surgeon: Dolores Patty, MD;  Location: [facility] INVASIVE CV LAB;  Service: Cardiovascular;  Laterality: N/A;    Social History Social  History   Tobacco Use  . Smoking status: Current Some Day Smoker    Packs/day: 0.50    Years: 30.00    Pack years: 15.00    Types: Cigarettes  . Smokeless tobacco: Never Used  Substance Use Topics  . Alcohol use: Yes    Alcohol/week: 5.0 standard drinks    Types: 5 Cans of beer per week    Comment:  occasionally  . Drug use: Yes    Types: Marijuana    Family History Family History  Problem Relation Age of Onset  . Asthma Mother   . Diabetes Mother   . Hyperlipidemia Mother   . Hypertension Mother   . Lung cancer Father   . Cancer Father   . Stroke Sister     Allergies  Allergen Reactions  . Entresto [Sacubitril-Valsartan]     Dizziness, sick on stomach     REVIEW OF SYSTEMS  General: [ ]  Weight loss, [ ]  Fever, [ ]  chills Neurologic: [ ]  Dizziness, [ ]  Blackouts, [ ]  Seizure [ ]  Stroke, [ ]  "Mini stroke", [ ]  Slurred speech, [ ]  Temporary blindness; [ ]  weakness in arms or legs, [ ]  Hoarseness [ ]  Dysphagia Cardiac: [ ]  Chest pain/pressure, [ ]  Shortness of breath at rest [ ]  Shortness of breath with exertion, [ ]  Atrial fibrillation or irregular heartbeat  Vascular: [ ]  Pain in legs with walking, [ ]  Pain in legs at rest, [ ]  Pain in legs at night,  [ ]  Non-healing ulcer, [ ]  Blood clot in vein/DVT,   Pulmonary: [ ]  Home oxygen, [ ]  Productive cough, [ ]  Coughing up blood, [ ]  Asthma,  [ ]  Wheezing [ ]  COPD Musculoskeletal:  [ ]  Arthritis, [ ]  Low back pain, [ ]  Joint pain Hematologic: [ ]  Easy Bruising, [ ]  Anemia; [ ]  Hepatitis Gastrointestinal: [ ]  Blood in stool, [ ]  Gastroesophageal Reflux/heartburn, Urinary: [ ]  chronic Kidney disease, [ ]  on HD - [ ]  MWF or [ ]  TTHS, [ ]  Burning with urination, [ ]  Difficulty urinating Skin: [ ]  Rashes, [ ]  Wounds Psychological: [ ]  Anxiety, [ ]  Depression  Physical Examination Vitals:   05/17/18 0323 05/17/18 0502 05/17/18 0810 05/17/18 1311  BP: (!) 149/105  (!) 158/109 (!) 152/109  Pulse: 92  97   Resp: (!) 21  (!)  21   Temp: 98.1 F (36.7 C)  97.6 F (36.4 C)   TempSrc: Oral  Oral   SpO2: 99%  97%   Weight:  75.3 kg    Height:       Body mass index is 23.15 kg/m.  General:  WDWN in NAD HENT: WNL, normocephalic Eyes: Pupils equal Pulmonary: normal non-labored breathing , without Rales, rhonchi,  wheezing Cardiac: RRR, without  Murmurs, rubs or gallops; right carotid bruits Abdomen: soft, NT, no masses Skin: no rashes, ulcers noted;  no Gangrene , no cellulitis; no open wounds;   Vascular Exam/Pulses:radial, femoral pulses B   Musculoskeletal: no muscle wasting or atrophy; no edema  Neurologic: A&O X 3; Appropriate Affect ;  SENSATION: normal; MOTOR FUNCTION: 5/5 Symmetric Speech is fluent/normal   Significant Diagnostic Studies: CBC Lab Results  Component Value Date   WBC 7.1 05/14/2018   HGB 13.2 05/14/2018   HCT 42.1 05/14/2018   MCV 87.3 05/14/2018   PLT 207 05/14/2018    BMET    Component Value Date/Time   NA 130 (L) 05/17/2018 0333   NA 140 01/26/2018 0827   K 4.2 05/17/2018 0333   CL 90 (L) 05/17/2018 0333   CO2 30 05/17/2018 0333   GLUCOSE 114 (H) 05/17/2018 0333   BUN 23 (H) 05/17/2018 0333   BUN 12 01/26/2018 0827   CREATININE 1.57 (H) 05/17/2018 0333   CREATININE 1.11 03/31/2016 0950   CALCIUM 9.0 05/17/2018 0333   GFRNONAA 47 (L) 05/17/2018 0333   GFRNONAA 73 03/31/2016 0950  GFRAA 54 (L) 05/17/2018 0333   GFRAA 85 03/31/2016 0950   Estimated Creatinine Clearance: 54 mL/min (A) (by C-G formula based on SCr of 1.57 mg/dL (H)).  COAG Lab Results  Component Value Date   INR 1.29 05/13/2018   INR 1.19 12/24/2017   INR 1.17 03/30/2017     Non-Invasive Vascular Imaging:  Pending carotid duplex  ASSESSMENT/PLAN:  Asymptomatic right carotid stenosis History of right carotid stenosis with calcification.  Angiography with carotid stent on the right was planned 03/2016.  Patient canceled surgery and did not reschedule.  He was lost to f/u.  His  stenosis was noted to be right ICA 80-99% stenosis by duplex.  I have ordered a new carotid duplex.  He has a planned Cardiac cath planned for tomorrow.  Once Dr. Myra Gianotti has reviewed his records including the pending carotid duplex he will make recommendations.   Mosetta Pigeon 05/17/2018 1:58 PM   I have independently interviewed and examined patient and agree with PA assessment and plan above.  Patient has now occluded his right common carotid artery.  Left ICA stent with 50 to 75% stenosis will need to be monitored as outpatient as patient was previously lost to follow-up.  I will notify Dr. Myra Gianotti of patient's admission.  Shalea Tomczak C. Randie Heinz, MD [facility] Office: [phone] Pager: [phone]

## 2018-05-17 NOTE — Progress Notes (Signed)
VASCULAR LAB PRELIMINARY  PRELIMINARY  PRELIMINARY  PRELIMINARY  Carotid duplex completed.    Preliminary report:  Occluded right CCA.  Right proximal ICA with minimal retrograde flow.  50-75% stenosis noted in the left ICA stent.  Bilateral vertebral artery flow is antegrade.   Jaszmine Navejas, RVT 05/17/2018, 4:27 PM

## 2018-05-18 ENCOUNTER — Encounter (HOSPITAL_COMMUNITY)
Admission: EM | Disposition: A | Discharge status: Home Health | Payer: Self-pay | Source: Home / Self Care | Attending: Cardiology

## 2018-05-18 DIAGNOSIS — I2510 Atherosclerotic heart disease of native coronary artery without angina pectoris: Secondary | ICD-10-CM

## 2018-05-18 HISTORY — PX: CORONARY/GRAFT ANGIOGRAPHY: CATH118237

## 2018-05-18 HISTORY — PX: CORONARY STENT INTERVENTION: CATH118234

## 2018-05-18 LAB — BASIC METABOLIC PANEL
Anion gap: 12 (ref 5–15)
BUN: 20 mg/dL (ref 6–20)
CALCIUM: 9.3 mg/dL (ref 8.9–10.3)
CHLORIDE: 90 mmol/L — AB (ref 98–111)
CO2: 27 mmol/L (ref 22–32)
CREATININE: 1.55 mg/dL — AB (ref 0.61–1.24)
GFR calc non Af Amer: 47 mL/min — ABNORMAL LOW (ref >60)
GFR, EST AFRICAN AMERICAN: 55 mL/min — AB (ref >60)
GLUCOSE: 134 mg/dL — AB (ref 70–99)
Potassium: 4.5 mmol/L (ref 3.5–5.1)
Sodium: 129 mmol/L — ABNORMAL LOW (ref 135–145)

## 2018-05-18 LAB — COOXEMETRY PANEL
Carboxyhemoglobin: 1.7 % — ABNORMAL HIGH (ref 0.5–1.5)
Methemoglobin: 0.8 % (ref 0.0–1.5)
O2 SAT: 63 %
Total hemoglobin: 15 g/dL (ref 12.0–16.0)

## 2018-05-18 LAB — POCT ACTIVATED CLOTTING TIME
ACTIVATED CLOTTING TIME: 252 s
ACTIVATED CLOTTING TIME: 241 s
Activated Clotting Time: 274 seconds

## 2018-05-18 SURGERY — CORONARY STENT INTERVENTION
Anesthesia: LOCAL

## 2018-05-18 MED ORDER — SODIUM CHLORIDE 0.9 % IV SOLN: 250.0000 mL | INTRAVENOUS | Status: DC | PRN

## 2018-05-18 MED ORDER — VERAPAMIL HCL 2.5 MG/ML IV SOLN
INTRAVENOUS | Status: AC
  Filled 2018-05-18: qty 2

## 2018-05-18 MED ORDER — SODIUM CHLORIDE 0.9 % IV SOLN: INTRAVENOUS | Status: AC

## 2018-05-18 MED ORDER — NITROGLYCERIN 1 MG/10 ML FOR IR/CATH LAB
INTRA_ARTERIAL | Status: DC | PRN
  Administered 2018-05-18: 150 ug via INTRACORONARY

## 2018-05-18 MED ORDER — FENTANYL CITRATE (PF) 100 MCG/2ML IJ SOLN
INTRAMUSCULAR | Status: AC
  Filled 2018-05-18: qty 2

## 2018-05-18 MED ORDER — HEPARIN SODIUM (PORCINE) 1000 UNIT/ML IJ SOLN
INTRAMUSCULAR | Status: DC | PRN
  Administered 2018-05-18: 3000 [IU] via INTRAVENOUS
  Administered 2018-05-18: 7000 [IU] via INTRAVENOUS

## 2018-05-18 MED ORDER — SODIUM CHLORIDE 0.9% FLUSH
3.0000 mL | Freq: Two times a day (BID) | INTRAVENOUS | Status: DC
  Administered 2018-05-19 – 2018-05-20 (×4): 3 mL via INTRAVENOUS

## 2018-05-18 MED ORDER — SODIUM CHLORIDE 0.9% FLUSH: 3.0000 mL | INTRAVENOUS | Status: DC | PRN

## 2018-05-18 MED ORDER — HEPARIN (PORCINE) IN NACL 1000-0.9 UT/500ML-% IV SOLN
INTRAVENOUS | Status: DC | PRN
  Administered 2018-05-18 (×3): 500 mL

## 2018-05-18 MED ORDER — LIDOCAINE HCL (PF) 1 % IJ SOLN
INTRAMUSCULAR | Status: DC | PRN
  Administered 2018-05-18: 2 mL via INTRADERMAL

## 2018-05-18 MED ORDER — LABETALOL HCL 5 MG/ML IV SOLN: 10.0000 mg | INTRAVENOUS | Status: AC | PRN

## 2018-05-18 MED ORDER — HEPARIN (PORCINE) IN NACL 1000-0.9 UT/500ML-% IV SOLN
INTRAVENOUS | Status: AC
  Filled 2018-05-18: qty 1000

## 2018-05-18 MED ORDER — VERAPAMIL HCL 2.5 MG/ML IV SOLN
INTRAVENOUS | Status: DC | PRN
  Administered 2018-05-18: 10 mL via INTRA_ARTERIAL

## 2018-05-18 MED ORDER — FENTANYL CITRATE (PF) 100 MCG/2ML IJ SOLN
INTRAMUSCULAR | Status: DC | PRN
  Administered 2018-05-18: 25 ug via INTRAVENOUS

## 2018-05-18 MED ORDER — MIDAZOLAM HCL 2 MG/2ML IJ SOLN
INTRAMUSCULAR | Status: AC
  Filled 2018-05-18: qty 2

## 2018-05-18 MED ORDER — HYDRALAZINE HCL 20 MG/ML IJ SOLN: 5.0000 mg | INTRAMUSCULAR | Status: AC | PRN

## 2018-05-18 MED ORDER — LIDOCAINE HCL (PF) 1 % IJ SOLN
INTRAMUSCULAR | Status: AC
  Filled 2018-05-18: qty 30

## 2018-05-18 MED ORDER — IOHEXOL 350 MG/ML SOLN
INTRAVENOUS | Status: DC | PRN
  Administered 2018-05-18: 105 mL via INTRAVENOUS

## 2018-05-18 MED ORDER — NITROGLYCERIN 1 MG/10 ML FOR IR/CATH LAB
INTRA_ARTERIAL | Status: AC
  Filled 2018-05-18: qty 10

## 2018-05-18 MED ORDER — MIDAZOLAM HCL 2 MG/2ML IJ SOLN
INTRAMUSCULAR | Status: DC | PRN
  Administered 2018-05-18: 1 mg via INTRAVENOUS

## 2018-05-18 SURGICAL SUPPLY — 22 items
BALLN EMERGE MR 2.5X12 (BALLOONS) ×2
BALLN ~~LOC~~ EMERGE MR 3.25X12 (BALLOONS) ×2
BALLOON EMERGE MR 2.5X12 (BALLOONS) IMPLANT
BALLOON ~~LOC~~ EMERGE MR 3.25X12 (BALLOONS) IMPLANT
CATH 5FR JL3.5 JR4 ANG PIG MP (CATHETERS) ×1 IMPLANT
CATH INFINITI 5 FR AR1 MOD (CATHETERS) ×1 IMPLANT
CATH INFINITI 5 FR LCB (CATHETERS) ×1 IMPLANT
CATH INFINITI 5FR AL1 (CATHETERS) ×1 IMPLANT
CATH LAUNCHER 6FR AL.75 (CATHETERS) ×1 IMPLANT
CATH LAUNCHER 6FR EBU3.5 (CATHETERS) ×1 IMPLANT
DEVICE RAD COMP TR BAND LRG (VASCULAR PRODUCTS) ×1 IMPLANT
ELECT DEFIB PAD ADLT CADENCE (PAD) ×1 IMPLANT
GLIDESHEATH SLEND SS 6F .021 (SHEATH) ×1 IMPLANT
GUIDEWIRE INQWIRE 1.5J.035X260 (WIRE) IMPLANT
INQWIRE 1.5J .035X260CM (WIRE) ×2
KIT ENCORE 26 ADVANTAGE (KITS) ×2 IMPLANT
KIT HEART LEFT (KITS) ×2 IMPLANT
PACK CARDIAC CATHETERIZATION (CUSTOM PROCEDURE TRAY) ×2 IMPLANT
STENT SYNERGY DES 3X16 (Permanent Stent) ×1 IMPLANT
TRANSDUCER W/STOPCOCK (MISCELLANEOUS) ×2 IMPLANT
TUBING CIL FLEX 10 FLL-RA (TUBING) ×2 IMPLANT
WIRE MINAMO 190 (WIRE) ×1 IMPLANT

## 2018-05-18 NOTE — Progress Notes (Signed)
  Advanced Heart Failure Rounding Note  PCP-Cardiologist: No primary care provider on file.   Subjective:    Remains on milrinone. No CP or SOB. Co-ox 63%  Creatinine stable at 1.5  CVP 8-9 Eager to go home   For PCI today    Objective:   Weight Range: 75.6 kg Body mass index is 23.25 kg/m.   Vital Signs:   Temp:  [97.5 F (36.4 C)-97.6 F (36.4 C)] 97.6 F (36.4 C) (11/19 0430) Pulse Rate:  [91-97] 91 (11/18 1930) Resp:  [19-24] 21 (11/19 0430) BP: (137-159)/(93-111) 159/111 (11/19 0430) SpO2:  [97 %-98 %] 98 % (11/19 0430) Weight:  [75.6 kg] 75.6 kg (11/19 0430) Last BM Date: 05/16/18  Weight change: Filed Weights   05/16/18 0500 05/17/18 0502 05/18/18 0430  Weight: 77.7 kg 75.3 kg 75.6 kg    Intake/Output:   Intake/Output Summary (Last 24 hours) at 05/18/2018 0656 Last data filed at 05/18/2018 0002 Gross per 24 hour  Intake 1134.94 ml  Output 1485 ml  Net -350.06 ml      Physical Exam    General:  Well appearing. No resp difficulty HEENT: normal Neck: supple.JVP 8-9 . Carotids 2+ bilat; no bruits. No lymphadenopathy or thryomegaly appreciated. Cor: PMI laterally displaced. Regular rate & rhythm. +s3 Lungs: clear Abdomen: soft, nontender, nondistended. No hepatosplenomegaly. No bruits or masses. Good bowel sounds. Extremities: no cyanosis, clubbing, rash, edema Neuro: alert & orientedx3, cranial nerves grossly intact. moves all 4 extremities w/o difficulty. Affect pleasant   Telemetry   NSR 80-90s. Personally reviewed.   EKG    No new tracings.  Labs    CBC No results for input(s): WBC, NEUTROABS, HGB, HCT, MCV, PLT in the last 72 hours. Basic Metabolic Panel Recent Labs    05/17/18 0333 05/18/18 0437  NA 130* 129*  K 4.2 4.5  CL 90* 90*  CO2 30 27  GLUCOSE 114* 134*  BUN 23* 20  CREATININE 1.57* 1.55*  CALCIUM 9.0 9.3   Liver Function Tests No results for input(s): AST, ALT, ALKPHOS, BILITOT, PROT, ALBUMIN in the last 72  hours. No results for input(s): LIPASE, AMYLASE in the last 72 hours. Cardiac Enzymes No results for input(s): CKTOTAL, CKMB, CKMBINDEX, TROPONINI in the last 72 hours.  BNP: BNP (last 3 results) Recent Labs    05/12/18 1035  BNP 2,066.0*    ProBNP (last 3 results) No results for input(s): PROBNP in the last 8760 hours.   D-Dimer No results for input(s): DDIMER in the last 72 hours. Hemoglobin A1C No results for input(s): HGBA1C in the last 72 hours. Fasting Lipid Panel No results for input(s): CHOL, HDL, LDLCALC, TRIG, CHOLHDL, LDLDIRECT in the last 72 hours. Thyroid Function Tests No results for input(s): TSH, T4TOTAL, T3FREE, THYROIDAB in the last 72 hours.  Invalid input(s): FREET3  Other results:   Imaging    Vas Us Carotid  Result Date: 05/17/2018 Carotid Arterial Duplex Study Indications:   History of 80-99% carotid stenosis 04/14/16. Left carotid stent                02/22/13. Risk Factors:  Hypertension, hyperlipidemia, coronary artery disease. Other Factors: CABG, ETOH. Performing Technologist: Candace Kanady RVS  Examination Guidelines: A complete evaluation includes B-mode imaging, spectral Doppler, color Doppler, and power Doppler as needed of all accessible portions of each vessel. Bilateral testing is considered an integral part of a complete examination. Limited examinations for reoccurring indications may be performed as noted.  Right Carotid Findings: +----------+--------+--------+--------+--------+---------------+             PSV cm/sEDV cm/sStenosisDescribeComments        +----------+--------+--------+--------+--------+---------------+ CCA Prox                  Occluded                        +----------+--------+--------+--------+--------+---------------+ CCA Mid                   Occluded                        +----------+--------+--------+--------+--------+---------------+ CCA Distal                Occluded                         +----------+--------+--------+--------+--------+---------------+ ICA Prox  29      16                      retrograde flow +----------+--------+--------+--------+--------+---------------+ ICA Distal33      21                                      +----------+--------+--------+--------+--------+---------------+ ECA       403     140                                     +----------+--------+--------+--------+--------+---------------+ +----------+--------+-------+--------+-------------------+           PSV cm/sEDV cmsDescribeArm Pressure (mmHG) +----------+--------+-------+--------+-------------------+ Subclavian106                                        +----------+--------+-------+--------+-------------------+ +---------+--------+--+--------+--+ VertebralPSV cm/s89EDV cm/s23 +---------+--------+--+--------+--+  left Stent(s): +---------------+---+--++++ Prox to Stent  13735 +---------------+---+--++++ Proximal Stent 89 29 +---------------+---+--++++ Mid Stent      21252 +---------------+---+--++++ Distal to Stent77 29 +---------------+---+--++++ 50-75% stenosis  Left Carotid Findings: +----------+--------+--------+--------+-----------+--------+           PSV cm/sEDV cm/sStenosisDescribe   Comments +----------+--------+--------+--------+-----------+--------+ CCA Prox  62      24              homogeneous         +----------+--------+--------+--------+-----------+--------+ CCA Distal64      22              homogeneous         +----------+--------+--------+--------+-----------+--------+ ICA Prox  137     35                                  +----------+--------+--------+--------+-----------+--------+ ICA Mid   212     52                                  +----------+--------+--------+--------+-----------+--------+ ICA Distal77      29                                   +----------+--------+--------+--------+-----------+--------+ ECA       190     37                                  +----------+--------+--------+--------+-----------+--------+ +----------+--------+--------+--------+-------------------+   SubclavianPSV cm/sEDV cm/sDescribeArm Pressure (mmHG) +----------+--------+--------+--------+-------------------+           256                                         +----------+--------+--------+--------+-------------------+ +---------+--------+--+--------+--+ VertebralPSV cm/s49EDV cm/s20 +---------+--------+--+--------+--+  Summary: Right Carotid: Velocities in the right ICA are consistent with a total                occlusion. The CCA appears occluded. Retrograde flow noted                proximal ICA. Left Carotid: 50-75% stenosis noted in the left ICA stent. Vertebrals:  Bilateral vertebral arteries demonstrate antegrade flow. Subclavians: Normal flow hemodynamics were seen in bilateral subclavian              arteries. *See table(s) above for measurements and observations.  Electronically signed by Brandon Cain MD on 05/17/2018 at 5:01:12 PM.    Final      Medications:     Scheduled Medications: . aspirin EC  81 mg Oral Daily  . atorvastatin  80 mg Oral QPM  . citalopram  20 mg Oral Daily  . clopidogrel  75 mg Oral Q breakfast  . enoxaparin (LOVENOX) injection  40 mg Subcutaneous QHS  . ezetimibe  10 mg Oral Daily  . furosemide  40 mg Oral Daily  . hydrALAZINE  50 mg Oral Q8H  . isosorbide mononitrate  30 mg Oral Daily  . potassium chloride  40 mEq Oral BID  . sodium chloride flush  10-40 mL Intracatheter Q12H  . sodium chloride flush  3 mL Intravenous Q12H  . spironolactone  25 mg Oral Daily    Infusions: . sodium chloride    . sodium chloride 10 mL/hr at 05/18/18 0610  . milrinone 0.25 mcg/kg/min (05/17/18 2126)    PRN Medications: sodium chloride, acetaminophen, albuterol, cyclobenzaprine, nitroGLYCERIN, ondansetron  (ZOFRAN) IV, sodium chloride flush, sodium chloride flush    Patient Profile   Randy Palmeris a 57 y.o.malewith history of combined HF (diagnosed 03/2017), HTN, HLD, CAD s/p CABG2010 and PCI (last ~2014), former tobacco use, CVA, and AAA.  Assessment/Plan   1. Acute on chronic systolic HF.Echo in 2017 showed EF 55% -Echo 03/2017: EF 30-35% with grade 2 DD. - Echo 01/20/18: EF 30-35%, grade 2 DD, RV mildly reduced. - Echo 05/12/18: EF 15%, diffuse HK, LA severely dilated, RV moderately reduced, RA moderately dilated, mod TR, PA peak pressure 44 mm Hg - Milrinone 0.25 started on 11/15 for co-ox 43% and worsening creatinine.  - Volume much improved after 24 pound diureis. CVP 8-9. Coox 63% on milrinone 0.25 mcg/kg/min.  - Off IV lasix. Continue lasix 40 mg daily.  - No coreg with low output. - Continue spiro 25 mg daily - Continue hydralazine to 50 mg TID. Continue imdur 30 mg daily. SBP 130-150s - ACE on hold with AKI. Did not tolerate with Entresto with vomiting after each dose. - Hold off on dig with AKI.   2. CAD s/p CABG 2010 -> NSTEMI - LHC 12/29/17 showed patent LIMA to LAD, SVG to RCA occluded, SVG to OM-2 with 95% prox lesion. Has unprotected AV-groove LCX which feeds collaterals to distal RCA. Interventional team consulted for ?PCI of distal left main. - Troponin 5.18 > 4.10 > 2.59 - No CP.  - Continue ASA, plavix, statin. No longer on heparin. Lovenox   for VTE. - For PCI LM/LCX with Dr. Cooper today. Orders written. On Plavix.   3. Carotid stenosis - Carotid US 04/14/16: Right ICA 80-99%, Left ICA upper range <50% vs low range >50%. - Asymptomatic. No change.  - Plan for carotid stent at some point. [facility] has seen and re-ordered carotid u/s which suggests total occlusion of R ICA with 50-75% LICA    4. HTN - SBP 130-150s. Hydralazine increased yesterday. Will titrate further after cath   5. Acute on CKD - Baseline ~1.2-1.4 - Creatinine 1.93 > 2.07 -> 1.8 -> 1.6  -> 1.57 -> 1.55 with milrinone support  6. ?OSA - Needs sleep study outpatient. No change.   7. Alcohol abuse - No ETOH in 3 weeks. Was drinking 1 6 pack beer/week prior. No change.   8. Hyperlipidemia - LDL 159. Continue statin. No change.     Medication concerns reviewed with patient and pharmacy team. Barriers identified: compliance with follow up.    Length of Stay: 5  Zeno Hickel, MD  05/18/2018, 6:56 AM  Advanced Heart Failure Team Pager [phone] (M-F; 7a - 4p)  Please contact [facility] for night-coverage after hours (4p -7a ) and weekends on [email or web address]      

## 2018-05-18 NOTE — Progress Notes (Signed)
Received with R upper arm picc line accessed from room with Milrinone at 6.3 cc /hr or 0.25 mcg/kg/min with ns carrier fluid at 10 cc/hr. Lance Bosch called 4e RN- Mitzi Davenport and Judie Grieve confirmed that the patient did have asa and plavix this am.

## 2018-05-18 NOTE — Progress Notes (Signed)
L wrist with TR band intact. Noted proximal to the TR band a resolving ecchymotic area that the patient states "has been there" before the procedure.  Pt. Noted with l hand neurovascular status intact. Call bell placed, monitoring.

## 2018-05-18 NOTE — Care Management Note (Signed)
Case Management Note Donn Pierini RN, BSN Transitions of Care Unit 4E- RN Case Manager [phone]  Patient Details  Name: Randy [name] MRN: [id number] Date of Birth: 17-Nov-1960  Subjective/Objective:   Pt admitted with NSTEMI, acute on chronic HF, milrinone started on 11/15,                 Action/Plan: PTA pt from home, plan for PCI on 11/19, also has Carotid stenosis- per MD note- vascular to follow as outpt. - CM to follow for transition of care needs  Expected Discharge Date:                  Expected Discharge Plan:  Home w Home Health Services  In-House Referral:     Discharge planning Services  CM Consult  Post Acute Care Choice:    Choice offered to:     DME Arranged:    DME Agency:     HH Arranged:    HH Agency:     Status of Service:  In process, will continue to follow  If discussed at Long Length of Stay Meetings, dates discussed:    Discharge Disposition:   Additional Comments:  Randy Span, RN 05/18/2018, 11:04 AM

## 2018-05-18 NOTE — H&P (View-Only) (Signed)
Advanced Heart Failure Rounding Note  PCP-Cardiologist: No primary care provider on file.   Subjective:    Remains on milrinone. No CP or SOB. Co-ox 63%  Creatinine stable at 1.5  CVP 8-9 Eager to go home   For PCI today    Objective:   Weight Range: 75.6 kg Body mass index is 23.25 kg/m.   Vital Signs:   Temp:  [97.5 F (36.4 C)-97.6 F (36.4 C)] 97.6 F (36.4 C) (11/19 0430) Pulse Rate:  [91-97] 91 (11/18 1930) Resp:  [19-24] 21 (11/19 0430) BP: (137-159)/(93-111) 159/111 (11/19 0430) SpO2:  [97 %-98 %] 98 % (11/19 0430) Weight:  [75.6 kg] 75.6 kg (11/19 0430) Last BM Date: 05/16/18  Weight change: Filed Weights   05/16/18 0500 05/17/18 0502 05/18/18 0430  Weight: 77.7 kg 75.3 kg 75.6 kg    Intake/Output:   Intake/Output Summary (Last 24 hours) at 05/18/2018 0656 Last data filed at 05/18/2018 0002 Gross per 24 hour  Intake 1134.94 ml  Output 1485 ml  Net -350.06 ml      Physical Exam    General:  Well appearing. No resp difficulty HEENT: normal Neck: supple.JVP 8-9 . Carotids 2+ bilat; no bruits. No lymphadenopathy or thryomegaly appreciated. Cor: PMI laterally displaced. Regular rate & rhythm. +s3 Lungs: clear Abdomen: soft, nontender, nondistended. No hepatosplenomegaly. No bruits or masses. Good bowel sounds. Extremities: no cyanosis, clubbing, rash, edema Neuro: alert & orientedx3, cranial nerves grossly intact. moves all 4 extremities w/o difficulty. Affect pleasant   Telemetry   NSR 80-90s. Personally reviewed.   EKG    No new tracings.  Labs    CBC No results for input(s): WBC, NEUTROABS, HGB, HCT, MCV, PLT in the last 72 hours. Basic Metabolic Panel Recent Labs    54/62/70 0333 05/18/18 0437  NA 130* 129*  K 4.2 4.5  CL 90* 90*  CO2 30 27  GLUCOSE 114* 134*  BUN 23* 20  CREATININE 1.57* 1.55*  CALCIUM 9.0 9.3   Liver Function Tests No results for input(s): AST, ALT, ALKPHOS, BILITOT, PROT, ALBUMIN in the last 72  hours. No results for input(s): LIPASE, AMYLASE in the last 72 hours. Cardiac Enzymes No results for input(s): CKTOTAL, CKMB, CKMBINDEX, TROPONINI in the last 72 hours.  BNP: BNP (last 3 results) Recent Labs    05/12/18 1035  BNP 2,066.0*    ProBNP (last 3 results) No results for input(s): PROBNP in the last 8760 hours.   D-Dimer No results for input(s): DDIMER in the last 72 hours. Hemoglobin A1C No results for input(s): HGBA1C in the last 72 hours. Fasting Lipid Panel No results for input(s): CHOL, HDL, LDLCALC, TRIG, CHOLHDL, LDLDIRECT in the last 72 hours. Thyroid Function Tests No results for input(s): TSH, T4TOTAL, T3FREE, THYROIDAB in the last 72 hours.  Invalid input(s): FREET3  Other results:   Imaging    Vas US Carotid  Result Date: 05/17/2018 Carotid Arterial Duplex Study Indications:   History of 80-99% carotid stenosis 04/14/16. Left carotid stent                02/22/13. Risk Factors:  Hypertension, hyperlipidemia, coronary artery disease. Other Factors: CABG, ETOH. Performing Technologist: Sherren Kerns RVS  Examination Guidelines: A complete evaluation includes B-mode imaging, spectral Doppler, color Doppler, and power Doppler as needed of all accessible portions of each vessel. Bilateral testing is considered an integral part of a complete examination. Limited examinations for reoccurring indications may be performed as noted.  Right Carotid Findings: +----------+--------+--------+--------+--------+---------------+  PSV cm/sEDV cm/sStenosisDescribeComments        +----------+--------+--------+--------+--------+---------------+ CCA Prox                  Occluded                        +----------+--------+--------+--------+--------+---------------+ CCA Mid                   Occluded                        +----------+--------+--------+--------+--------+---------------+ CCA Distal                Occluded                         +----------+--------+--------+--------+--------+---------------+ ICA Prox  29      16                      retrograde flow +----------+--------+--------+--------+--------+---------------+ ICA Distal33      21                                      +----------+--------+--------+--------+--------+---------------+ ECA       403     140                                     +----------+--------+--------+--------+--------+---------------+ +----------+--------+-------+--------+-------------------+           PSV cm/sEDV cmsDescribeArm Pressure (mmHG) +----------+--------+-------+--------+-------------------+ [id number]                                        +----------+--------+-------+--------+-------------------+ +---------+--------+--+--------+--+ VertebralPSV cm/s89EDV cm/s23 +---------+--------+--+--------+--+  left Stent(s): +---------------+---+--++++ Prox to Stent  13735 +---------------+---+--++++ Proximal Stent 89 29 +---------------+---+--++++ Mid Stent      21252 +---------------+---+--++++ Distal to Stent77 29 +---------------+---+--++++ 50-75% stenosis  Left Carotid Findings: +----------+--------+--------+--------+-----------+--------+           PSV cm/sEDV cm/sStenosisDescribe   Comments +----------+--------+--------+--------+-----------+--------+ CCA Prox  62      24              homogeneous         +----------+--------+--------+--------+-----------+--------+ CCA Distal64      22              homogeneous         +----------+--------+--------+--------+-----------+--------+ ICA Prox  137     35                                  +----------+--------+--------+--------+-----------+--------+ ICA Mid   212     52                                  +----------+--------+--------+--------+-----------+--------+ ICA Distal77      29                                   +----------+--------+--------+--------+-----------+--------+ ECA       190     37                                  +----------+--------+--------+--------+-----------+--------+ +----------+--------+--------+--------+-------------------+  SubclavianPSV cm/sEDV cm/sDescribeArm Pressure (mmHG) +----------+--------+--------+--------+-------------------+           256                                         +----------+--------+--------+--------+-------------------+ +---------+--------+--+--------+--+ VertebralPSV cm/s49EDV cm/s20 +---------+--------+--+--------+--+  Summary: Right Carotid: Velocities in the right ICA are consistent with a total                occlusion. The CCA appears occluded. Retrograde flow noted                proximal ICA. Left Carotid: 50-75% stenosis noted in the left ICA stent. Vertebrals:  Bilateral vertebral arteries demonstrate antegrade flow. Subclavians: Normal flow hemodynamics were seen in bilateral subclavian              arteries. *See table(s) above for measurements and observations.  Electronically signed by Lemar Livings MD on 05/17/2018 at 5:01:12 PM.    Final      Medications:     Scheduled Medications: . aspirin EC  81 mg Oral Daily  . atorvastatin  80 mg Oral QPM  . citalopram  20 mg Oral Daily  . clopidogrel  75 mg Oral Q breakfast  . enoxaparin (LOVENOX) injection  40 mg Subcutaneous QHS  . ezetimibe  10 mg Oral Daily  . furosemide  40 mg Oral Daily  . hydrALAZINE  50 mg Oral Q8H  . isosorbide mononitrate  30 mg Oral Daily  . potassium chloride  40 mEq Oral BID  . sodium chloride flush  10-40 mL Intracatheter Q12H  . sodium chloride flush  3 mL Intravenous Q12H  . spironolactone  25 mg Oral Daily    Infusions: . sodium chloride    . sodium chloride 10 mL/hr at 05/18/18 0610  . milrinone 0.25 mcg/kg/min (05/17/18 2126)    PRN Medications: sodium chloride, acetaminophen, albuterol, cyclobenzaprine, nitroGLYCERIN, ondansetron  (ZOFRAN) IV, sodium chloride flush, sodium chloride flush    Patient Profile   Randy Propstis a 57 y.o.malewith history of combined HF (diagnosed 03/2017), HTN, HLD, CAD s/p CABG2010 and PCI (last ~2014), former tobacco use, CVA, and AAA.  Assessment/Plan   1. Acute on chronic systolic HF.Echo in 2017 showed EF 55% -Echo 03/2017: EF 30-35% with grade 2 DD. - Echo 01/20/18: EF 30-35%, grade 2 DD, RV mildly reduced. - Echo 05/12/18: EF 15%, diffuse HK, LA severely dilated, RV moderately reduced, RA moderately dilated, mod TR, PA peak pressure 44 mm Hg - Milrinone 0.25 started on 11/15 for co-ox 43% and worsening creatinine.  - Volume much improved after 24 pound diureis. CVP 8-9. Coox 63% on milrinone 0.25 mcg/kg/min.  - Off IV lasix. Continue lasix 40 mg daily.  - No coreg with low output. - Continue spiro 25 mg daily - Continue hydralazine to 50 mg TID. Continue imdur 30 mg daily. SBP 130-150s - ACE on hold with AKI. Did not tolerate with Entresto with vomiting after each dose. - Hold off on dig with AKI.   2. CAD s/p CABG 2010 -> NSTEMI - LHC 12/29/17 showed patent LIMA to LAD, SVG to RCA occluded, SVG to OM-2 with 95% prox lesion. Has unprotected AV-groove LCX which feeds collaterals to distal RCA. Interventional team consulted for ?PCI of distal left main. - Troponin 5.18 > 4.10 > 2.59 - No CP.  - Continue ASA, plavix, statin. No longer on heparin. Lovenox  for VTE. - For PCI LM/LCX with Dr. Excell Seltzer today. Orders written. On Plavix.   3. Carotid stenosis - Carotid US 04/14/16: Right ICA 80-99%, Left ICA upper range <50% vs low range >50%. - Asymptomatic. No change.  - Plan for carotid stent at some point. [facility] has seen and re-ordered carotid u/s which suggests total occlusion of R ICA with 50-75% LICA    4. HTN - SBP 130-150s. Hydralazine increased yesterday. Will titrate further after cath   5. Acute on CKD - Baseline ~1.2-1.4 - Creatinine 1.93 > 2.07 -> 1.8 -> 1.6  -> 1.57 -> 1.55 with milrinone support  6. ?OSA - Needs sleep study outpatient. No change.   7. Alcohol abuse - No ETOH in 3 weeks. Was drinking 1 6 pack beer/week prior. No change.   8. Hyperlipidemia - LDL 159. Continue statin. No change.     Medication concerns reviewed with patient and pharmacy team. Barriers identified: compliance with follow up.    Length of Stay: 5  Arvilla Meres, MD  05/18/2018, 6:56 AM  Advanced Heart Failure Team Pager [phone] (M-F; 7a - 4p)  Please contact [facility] for night-coverage after hours (4p -7a ) and weekends on [email or web address]

## 2018-05-18 NOTE — Progress Notes (Signed)
Advanced Heart Failure Rounding Note  PCP-Cardiologist: No primary care provider on file.   Subjective:    Milrinone 0.25 started on 11/15 for co-ox 43% and worsening creatinine.   Great diuresis with IV lasix with I/O -2.9 L. Weight down 5 more lbs (24 lbs total). Co-ox 58% CVP 8.   Creatinine improving 1.9 > 2.07 -> 1.8 -> 1.6 -> 1.57. K 4.2  Denies SOB or dizziness. Feeling a lot better. Unsure of any plans for PCI yet.  Objective:   Weight Range: 77.7 kg Body mass index is 23.88 kg/m.   Vital Signs:                 Temp:  [97.5 F (36.4 C)-98.1 F (36.7 C)] 97.6 F (36.4 C) (11/17 0739) Pulse Rate:  [88-93] 88 (11/17 0739) Resp:  [17-26] 20 (11/17 0739) BP: (113-155)/(86-107) 155/106 (11/17 0739) SpO2:  [95 %-98 %] 97 % (11/17 0739) Weight:  [77.7 kg] 77.7 kg (11/17 0500) Last BM Date: 05/15/18  Weight change:      Filed Weights   05/14/18 0410 05/15/18 0504 05/16/18 0500  Weight: 83.6 kg 81.2 kg 77.7 kg    Intake/Output:             Intake/Output Summary (Last 24 hours) at 05/16/2018 1317 Last data filed at 05/16/2018 1228    Gross per 24 hour  Intake 1621.64 ml  Output 4925 ml  Net -3303.36 ml                 Physical Exam    General:  Sitting in chair. No resp difficulty HEENT: normal Neck: supple. JVP 8. Carotids 2+ bilat; no bruits. No lymphadenopathy or thryomegaly appreciated. Cor: PMI laterally displaced. Regular rate & rhythm. +s3 Lungs: clear Abdomen: soft, nontender, nondistended. No hepatosplenomegaly. No bruits or masses. Good bowel sounds. Extremities: no cyanosis, clubbing, rash, edema Neuro: alert & orientedx3, cranial nerves grossly intact. moves all 4 extremities w/o difficulty. Affect pleasant   Telemetry   NSR 80-90s. Personally reviewed.   EKG    No new tracings.  Labs    CBC RecentLabs(last2labs)     Recent Labs    05/14/18 0423  WBC 7.1  HGB 13.2  HCT 42.1  MCV 87.3  PLT 207     Basic Metabolic Panel RecentLabs(last2labs)      Recent Labs    05/15/18 0507 05/16/18 0430  NA 133* 133*  K 3.6 3.6  CL 94* 91*  CO2 31 32  GLUCOSE 126* 115*  BUN 22* 19  CREATININE 1.79* 1.63*  CALCIUM 8.7* 8.9     Liver Function Tests RecentLabs(last2labs)  No results for input(s): AST, ALT, ALKPHOS, BILITOT, PROT, ALBUMIN in the last 72 hours.   RecentLabs(last2labs)  No results for input(s): LIPASE, AMYLASE in the last 72 hours.   Cardiac Enzymes RecentLabs(last2labs)  No results for input(s): CKTOTAL, CKMB, CKMBINDEX, TROPONINI in the last 72 hours.    BNP: BNP (last 3 results) RecentLabs(withinlast365days)     Recent Labs    05/12/18 1035  BNP 2,066.0*      ProBNP (last 3 results) RecentLabs(withinlast365days)  No results for input(s): PROBNP in the last 8760 hours.     D-Dimer RecentLabs(last2labs)  No results for input(s): DDIMER in the last 72 hours.   Hemoglobin A1C RecentLabs(last2labs)  No results for input(s): HGBA1C in the last 72 hours.   Fasting Lipid Panel RecentLabs(last2labs)  No results for input(s): CHOL, HDL, LDLCALC, TRIG, CHOLHDL, LDLDIRECT in the last  72 hours.   Thyroid Function Tests  RecentLabs(last2labs)  No results for input(s): TSH, T4TOTAL, T3FREE, THYROIDAB in the last 72 hours.  Invalid input(s): FREET3    Other results:   Imaging     No results found.   Medications:     Scheduled Medications:  .  aspirin EC   81 mg  Oral  Daily   .  atorvastatin   80 mg  Oral  QPM   .  citalopram   20 mg  Oral  Daily   .  clopidogrel   75 mg  Oral  Q breakfast   .  enoxaparin (LOVENOX) injection   40 mg  Subcutaneous  QHS   .  ezetimibe   10 mg  Oral  Daily   .  furosemide   80 mg  Intravenous  BID   .  potassium chloride   40 mEq  Oral  BID   .  sodium chloride flush   10-40 mL  Intracatheter   Q12H   .  spironolactone   12.5 mg  Oral  Daily     Infusions:  .  milrinone  0.25 mcg/kg/min (05/16/18 0600)     PRN Medications:  acetaminophen, albuterol, cyclobenzaprine, nitroGLYCERIN, ondansetron (ZOFRAN) IV, sodium chloride flush    Patient Profile   Randy Propstis a 57 y.o.malewith history of combined HF (diagnosed 03/2017), HTN, HLD, CAD s/p CABG2010 and PCI (last ~2014), former tobacco use, CVA, and AAA.  Assessment/Plan   1.Acute on chronic systolic HF.Echo in 2017 showed EF 55% -Echo 03/2017: EF 30-35% with grade 2 DD. -Echo 01/20/18: EF 30-35%, grade 2 DD, RV mildly reduced. -Echo11/13/19: EF 15%, diffuse HK, LA severely dilated, RV moderately reduced, RA moderately dilated, mod TR, PA peak pressure 44 mm Hg - Milrinone 0.25 started on 11/15 for co-ox 43% and worsening creatinine.  - Volume much improved. CVP 8. Coox 58% on milrinone 0.25 mcg/kg/min.  - Stop IV lasix. Start lasix 40 mg daily.  -No coreg with low output. - Continue spiro 25 mg daily - Increase hydralazine to 50 mg TID. Continue imdur 30 mg daily. SBP 130-150s -ACE on hold with AKI. Did not tolerate with Entresto with vomiting after each dose. - Hold off on dig with AKI.   2. CAD s/p CABG 2010-> NSTEMI - LHC7/2/19 showed patent LIMA to LAD, SVG to RCA occluded, SVG to OM-2 with 95% prox lesion. Has unprotected AV-groove LCX which feeds collaterals to distal RCA. Interventional team consulted for ?PCI of distal left main. - Troponin 5.18 > 4.10 > 2.59 - No CP.  -ContinueASA, plavix, statin. No longer on heparin. Lovenox for VTE. - Will need PCI LM/LCX once HF optimized - likely early to midweek.Per Dr Elvis Coil note, it sounds like this now takes priority over CEA now. Unsure of timing.   3. Carotid stenosis -Carotid US 04/14/16: Right ICA 80-99%, Left ICA upper range <50% vs low range >50%. - Asymptomatic. No change.  -Needs right CEAat some point. [facility]  to see him this admission per patient.   4. HTN -SBP 130-150s. Increase hydralazine.    5.Acute onCKD - Baseline ~1.2-1.4 -Creatinine 1.93 > 2.07 -> 1.8 -> 1.6 -> 1.57 with milrinone support  6. ?OSA - Needs sleep study outpatient. No change.   7. Alcohol abuse -No ETOH in 3 weeks.Was drinking 1 6 pack beer/week prior. No change.   8. Hyperlipidemia - LDL 159. Continue statin. No change.    Medication concerns reviewed with patient and  pharmacy team. Barriers identified:compliance with follow up.   Length of Stay: 3  Arvilla Meres, MD  05/16/2018, 1:17 PM  Advanced Heart Failure Team Pager [phone] (M-F; 7a - 4p)  Please contact [facility] for night-coverage after hours (4p -7a ) and weekends on [email or web address]

## 2018-05-18 NOTE — Plan of Care (Signed)
  Problem: Clinical Measurements: Goal: Will remain free from infection Outcome: Progressing Goal: Cardiovascular complication will be avoided Outcome: Progressing   Problem: Elimination: Goal: Will not experience complications related to bowel motility Outcome: Progressing   Problem: Safety: Goal: Ability to remain free from injury will improve Outcome: Progressing   Problem: Safety: Goal: Ability to remain free from injury will improve Outcome: Progressing

## 2018-05-18 NOTE — Interval H&P Note (Signed)
Cath Lab Visit (complete for each Cath Lab visit)  Clinical Evaluation Leading to the Procedure:   ACS: Yes.    Non-ACS:    Anginal Classification: CCS IV  Anti-ischemic medical therapy: No Therapy  Non-Invasive Test Results: No non-invasive testing performed  Prior CABG: Previous CABG      History and Physical Interval Note:  05/18/2018 9:58 AM  Randy [name]  has presented today for surgery, with the diagnosis of cad  The various methods of treatment have been discussed with the patient and family. After consideration of risks, benefits and other options for treatment, the patient has consented to  Procedure(s): CORONARY STENT INTERVENTION (N/A) as a surgical intervention .  The patient's history has been reviewed, patient examined, no change in status, stable for surgery.  I have reviewed the patient's chart and labs.  Questions were answered to the patient's satisfaction.     Tonny Bollman

## 2018-05-18 NOTE — Progress Notes (Signed)
   Patient off the floor for coronary angiogram.  Ultrasound yesterday demonstrated occluded right common carotid artery and 50 to 75% stenosis in his left internal carotid artery stent which is possibly falsely elevated given the contralateral occlusion and the presence of stent.  He will need to reestablish follow-up and we will get him back in the office in 1 year with repeat carotid duplex given that he is asymptomatic.  If any symptoms should arise we would need to see him sooner.  Miyanna Wiersma C. Randie Heinz, MD [facility] Office: [phone] Pager: [phone]

## 2018-05-18 NOTE — Progress Notes (Signed)
Pt cvp transduced after zeroed. L upper extremity with neurovascular status intact at elevated to the level of the heart.

## 2018-05-19 ENCOUNTER — Encounter (HOSPITAL_COMMUNITY): Payer: Self-pay | Admitting: Cardiovascular Disease

## 2018-05-19 LAB — CBC
HCT: 42.2 % (ref 39.0–52.0)
Hemoglobin: 13.2 g/dL (ref 13.0–17.0)
MCH: 26.1 pg (ref 26.0–34.0)
MCHC: 31.3 g/dL (ref 30.0–36.0)
MCV: 83.4 fL (ref 80.0–100.0)
PLATELETS: 217 10*3/uL (ref 150–400)
RBC: 5.06 MIL/uL (ref 4.22–5.81)
RDW: 15.1 % (ref 11.5–15.5)
WBC: 9.5 10*3/uL (ref 4.0–10.5)
nRBC: 0 % (ref 0.0–0.2)

## 2018-05-19 LAB — COOXEMETRY PANEL
CARBOXYHEMOGLOBIN: 1.7 % — AB (ref 0.5–1.5)
METHEMOGLOBIN: 1.6 % — AB (ref 0.0–1.5)
O2 Saturation: 64.8 %
Total hemoglobin: 13.9 g/dL (ref 12.0–16.0)

## 2018-05-19 LAB — BASIC METABOLIC PANEL
ANION GAP: 11 (ref 5–15)
BUN: 19 mg/dL (ref 6–20)
CALCIUM: 8.9 mg/dL (ref 8.9–10.3)
CO2: 24 mmol/L (ref 22–32)
Chloride: 93 mmol/L — ABNORMAL LOW (ref 98–111)
Creatinine, Ser: 1.56 mg/dL — ABNORMAL HIGH (ref 0.61–1.24)
GFR calc Af Amer: 54 mL/min — ABNORMAL LOW (ref >60)
GFR, EST NON AFRICAN AMERICAN: 47 mL/min — AB (ref >60)
GLUCOSE: 141 mg/dL — AB (ref 70–99)
Potassium: 4.6 mmol/L (ref 3.5–5.1)
SODIUM: 128 mmol/L — AB (ref 135–145)

## 2018-05-19 MED ORDER — DIGOXIN 125 MCG PO TABS
0.1250 mg | ORAL_TABLET | Freq: Every day | ORAL | Status: DC
  Administered 2018-05-19 – 2018-05-21 (×3): 0.125 mg via ORAL
  Filled 2018-05-19 (×3): qty 1

## 2018-05-19 NOTE — Progress Notes (Addendum)
Advanced Heart Failure Rounding Note  PCP-Cardiologist: No primary care provider on file.   Subjective:    S/P PCI DES to L main on 11/19   Remains on milrinone 0.25 mcg. CO-OX 65%.   Feels great. Denies SOB or CP. Wants to go home.    Objective:   Weight Range: 76 kg Body mass index is 23.37 kg/m.   Vital Signs:   Temp:  [97.1 F (36.2 C)-97.9 F (36.6 C)] 97.2 F (36.2 C) (11/20 0746) Pulse Rate:  [93-106] 97 (11/20 0746) Resp:  [10-95] 15 (11/20 0746) BP: (90-172)/(59-140) 96/83 (11/20 0746) SpO2:  [94 %-100 %] 98 % (11/20 0746) Weight:  [76 kg] 76 kg (11/20 0300) Last BM Date: 05/17/18  Weight change: Filed Weights   05/17/18 0502 05/18/18 0430 05/19/18 0300  Weight: 75.3 kg 75.6 kg 76 kg    Intake/Output:   Intake/Output Summary (Last 24 hours) at 05/19/2018 0935 Last data filed at 05/19/2018 0800 Gross per 24 hour  Intake 156.85 ml  Output -  Net 156.85 ml      Physical Exam   CVP 7-8  General:  Well appearing. No resp difficulty. Sitting in the chair.  HEENT: normal anicteric  Neck: supple. no JVD. Carotids 2+ bilat; no bruits. No lymphadenopathy or thryomegaly appreciated. Cor: PMI nondisplaced. Regular rate & rhythm. No rubs, or murmurs. + S3  Lungs: clear no wheeze Abdomen: soft, nontender, nondistended. No hepatosplenomegaly. No bruits or masses. Good bowel sounds. Extremities: no cyanosis, clubbing, rash, edema. RUE PICC . L radial dressing intact.  Neuro: alert & oriented x 3, cranial nerves grossly intact. moves all 4 extremities w/o difficulty. Affect pleasant   Telemetry   NSR 80-90s. Personally reviewed.   EKG    No new tracings.  Labs    CBC Recent Labs    05/19/18 0539  WBC 9.5  HGB 13.2  HCT 42.2  MCV 83.4  PLT 217   Basic Metabolic Panel Recent Labs    16/10/96 0437 05/19/18 0539  NA 129* 128*  K 4.5 4.6  CL 90* 93*  CO2 27 24  GLUCOSE 134* 141*  BUN 20 19  CREATININE 1.55* 1.56*  CALCIUM 9.3 8.9    Liver Function Tests No results for input(s): AST, ALT, ALKPHOS, BILITOT, PROT, ALBUMIN in the last 72 hours. No results for input(s): LIPASE, AMYLASE in the last 72 hours. Cardiac Enzymes No results for input(s): CKTOTAL, CKMB, CKMBINDEX, TROPONINI in the last 72 hours.  BNP: BNP (last 3 results) Recent Labs    05/12/18 1035  BNP 2,066.0*    ProBNP (last 3 results) No results for input(s): PROBNP in the last 8760 hours.   D-Dimer No results for input(s): DDIMER in the last 72 hours. Hemoglobin A1C No results for input(s): HGBA1C in the last 72 hours. Fasting Lipid Panel No results for input(s): CHOL, HDL, LDLCALC, TRIG, CHOLHDL, LDLDIRECT in the last 72 hours. Thyroid Function Tests No results for input(s): TSH, T4TOTAL, T3FREE, THYROIDAB in the last 72 hours.  Invalid input(s): FREET3  Other results:   Imaging    No results found.   Medications:     Scheduled Medications: . aspirin EC  81 mg Oral Daily  . atorvastatin  80 mg Oral QPM  . citalopram  20 mg Oral Daily  . clopidogrel  75 mg Oral Q breakfast  . enoxaparin (LOVENOX) injection  40 mg Subcutaneous QHS  . ezetimibe  10 mg Oral Daily  . furosemide  40 mg  Oral Daily  . hydrALAZINE  50 mg Oral Q8H  . isosorbide mononitrate  30 mg Oral Daily  . potassium chloride  40 mEq Oral BID  . sodium chloride flush  10-40 mL Intracatheter Q12H  . sodium chloride flush  3 mL Intravenous Q12H  . spironolactone  25 mg Oral Daily    Infusions: . sodium chloride    . milrinone 0.25 mcg/kg/min (05/19/18 0800)    PRN Medications: sodium chloride, acetaminophen, albuterol, cyclobenzaprine, nitroGLYCERIN, ondansetron (ZOFRAN) IV, sodium chloride flush, sodium chloride flush    Patient Profile   Randy [name] a 57 y.o.malewith history of combined HF (diagnosed 03/2017), HTN, HLD, CAD s/p CABG2010 and PCI (last ~2014), former tobacco use, CVA, and AAA.  Assessment/Plan   1. Acute on chronic systolic  HF.Echo in 2017 showed EF 55% -Echo 03/2017: EF 30-35% with grade 2 DD. - Echo 01/20/18: EF 30-35%, grade 2 DD, RV mildly reduced. - Echo 05/12/18: EF 15%, diffuse HK, LA severely dilated, RV moderately reduced, RA moderately dilated, mod TR, PA peak pressure 44 mm Hg -CO-OX 65%. Wean milrinone to 0.125 mg. Repeat CO-OX in am.  - Volume status stable. Continue lasix 40 mg daily.  - No coreg with low output. - Continue spiro 25 mg daily - Continue hydralazine to 50 mg TID. Continue imdur 30 mg daily.  - ACE on hold with AKI. Did not tolerate with Entresto with vomiting after each dose. - Hold off on dig with AKI. Anticipate starting digoxin tomorrow.   2. CAD s/p CABG 2010 -> NSTEMI - LHC 12/29/17 showed patent LIMA to LAD, SVG to RCA occluded, SVG to OM-2 with 95% prox lesion. Has unprotected AV-groove LCX which feeds collaterals to distal RCA.  - 11/20 S/P DES to left main.  - Plan for dual antiplatelet therapy with asa mg daily + plavix for minimum of 12 months.   3. Carotid stenosis - Carotid US 04/14/16: Right ICA 80-99%, Left ICA upper range <50% vs low range >50%. - Asymptomatic. No change.  - Plan for carotid stent at some point. [facility] has seen and re-ordered carotid u/s which suggests total occlusion of R ICA with 50-75% LICA    4. HTN - Stable.   5. Acute on CKD - Baseline ~1.2-1.4 - Creatinine 1.93 > 2.07 -> 1.8 -> 1.6 -> 1.57 -> 1.55->1.5  with milrinone support  6. ?OSA - Needs sleep study outpatient. No change.   7. Alcohol abuse - No ETOH in 3 weeks. Was drinking 1 6 pack beer/week prior. No change.   8. Hyperlipidemia - LDL 159. Continue statin.   He would like meds to bed with TOC. He has Medicaid and will be able to pay for the Co-pay.    Medication concerns reviewed with patient and pharmacy team. Barriers identified: compliance with follow up.   Anticipate d/c 24-48 hours.   Length of Stay: 6  Amy Clegg, NP  05/19/2018, 9:35 AM  Advanced Heart  Failure Team Pager [phone] (M-F; 7a - 4p)  Please contact [facility] for night-coverage after hours (4p -7a ) and weekends on [email or web address]  Patient seen and examined with Tonye Becket, NP. We discussed all aspects of the encounter. I agree with the assessment and plan as stated above.   Cath films reviewed. Successful PCI of protected LM otherwise unchanged anatomy. Volume status looks good. Co-ox good on milrinone. Creatinine unchanged. Will begin to wean milrinone. Restart digoxin. R ICA now totally occluded. L 50-75%. Discussed with V[facility]. Will plan  medical management for now.   Arvilla Meres, MD  11:55 AM

## 2018-05-19 NOTE — Progress Notes (Signed)
CARDIAC REHAB PHASE I   PRE:  Rate/Rhythm: 96 SR  BP:  Supine:   Sitting: 91/44  Standing:    SaO2: 99%RA  MODE:  Ambulation: 360 ft   POST:  Rate/Rhythm: 104 ST  BP:  Supine:   Sitting: 109/87  Standing:    SaO2: 99%RA 1330-1430 Pt walked 360 ft on RA pushing IV pole with steady gait. Denied CP or SOB. To recliner after walk. MI education completed with pt who voiced understanding. Stressed importance of plavix with stent. Reviewed daily weights, 2000 mg sodium restriction, and 2 LFR. Gave low sodium handouts. Reviewed NTG use, MI restrictions, CRP 2 and smoking cessation. Gave smoking cessation handout. Pt stated he was down to 5 cigarettes a week. Stated he has quit now. Also he has not had ETOH in 3 weeks he stated. Referred to [place] CRP 2.   Luetta Nutting, RN BSN  05/19/2018 2:26 PM

## 2018-05-19 NOTE — Progress Notes (Signed)
Patient has hosp follow-up appt scheduled in the [facility] on 06/01/18 at 3 pm.

## 2018-05-19 NOTE — Progress Notes (Signed)
[facility]  [facility] Infusion Coordinator will follow pt with HF team and Case Mgr to support home infusion pharmacy services for home inotrope if needed at [place].  If patient discharges after hours, please call [phone].   Sedalia Muta 05/19/2018, 11:54 AM

## 2018-05-20 LAB — COOXEMETRY PANEL
Carboxyhemoglobin: 1.6 % — ABNORMAL HIGH (ref 0.5–1.5)
Methemoglobin: 1.6 % — ABNORMAL HIGH (ref 0.0–1.5)
O2 SAT: 63.3 %
Total hemoglobin: 13.2 g/dL (ref 12.0–16.0)

## 2018-05-20 LAB — BASIC METABOLIC PANEL
Anion gap: 9 (ref 5–15)
BUN: 21 mg/dL — AB (ref 6–20)
CALCIUM: 8.8 mg/dL — AB (ref 8.9–10.3)
CO2: 24 mmol/L (ref 22–32)
CREATININE: 1.61 mg/dL — AB (ref 0.61–1.24)
Chloride: 95 mmol/L — ABNORMAL LOW (ref 98–111)
GFR calc non Af Amer: 45 mL/min — ABNORMAL LOW (ref >60)
GFR, EST AFRICAN AMERICAN: 52 mL/min — AB (ref >60)
Glucose, Bld: 103 mg/dL — ABNORMAL HIGH (ref 70–99)
Potassium: 4.8 mmol/L (ref 3.5–5.1)
Sodium: 128 mmol/L — ABNORMAL LOW (ref 135–145)

## 2018-05-20 MED ORDER — HYDRALAZINE HCL 50 MG PO TABS
75.0000 mg | ORAL_TABLET | Freq: Three times a day (TID) | ORAL | Status: DC
  Administered 2018-05-20 – 2018-05-21 (×3): 75 mg via ORAL
  Filled 2018-05-20 (×3): qty 2

## 2018-05-20 NOTE — Discharge Summary (Addendum)
Advanced Heart Failure Team  Discharge Summary   Patient ID: Randy [name] MRN: [id number], DOB/AGE: 03/14/1961 57 y.o. Admit date: 05/12/2018 D/C date:     05/21/2018   Primary Discharge Diagnoses:  1. A/C Systolic Heart Failure  05/12/2018 ECHO 15%  2. CAD S/P CABG 2010->NSTEMI  3. Carotid Stenosis [facility] has seen and re-ordered carotid u/s which suggests total occlusion of R ICA with 50-75% LICA.  Will need follow up in 1 year. 4. HTN 5. A/C CKD  6. Suspect OSA 7. ETOH Abuse 8. Hyperlipidemia   Hospital Course:  Randy [name] a 57 y.o.malewith history of combined HF (diagnosed 03/2017), HTN, HLD, CAD s/p CABG2010 and PCI (last ~2014), former tobacco use, CVA, and AAA.  Admitted to [facility] with increased dyspnea. He was admitted with A/C systolic and troponin 5.18. Elevated troponin was thought to be in the setting of demand ischemia. He has an ECHO completed that showed severely reduced EF 15%.   Diuresed with IV lasix but had poor response so milrinone was added. Diuresed over 20 pounds. Once adequately diuresed interventional cardiology took him to the cath lab and he underwent PCI DES to left main. After heart catherization, milrinone was gradually weaned off. He will continue on aspirin and plavix for 1 year.   Heart failure medication gradually restarted. He was not on bb due to low output. He was not on entresto due to intolerance and not on an ace due to elevated K and creatinine. PICC line was removed prior to discharge.   Vascular consulted and repeated carotid U/S. This suggested total occlusion R ICA and 50-75% LICA. He did not require surgical intervention but will continue statin and follow up with [facility] in 1 year. He will continue to be followed closely in the Hf clinic. Plan to continue to optimize HF medications. He used [facility] pharmacy for discharge and had all medications delivered to his room.   LHC 11/19/019  Mid LM lesion is 90% stenosed.  Prox LAD lesion is 100%  stenosed.  Ost 2nd Mrg to 2nd Mrg lesion is 100% stenosed.  2nd Mrg lesion is 99% stenosed.  Prox Cx to Mid Cx lesion is 30% stenosed.  Ost RCA to Prox RCA lesion is 100% stenosed.  Mid Graft lesion is 30% stenosed.  Origin to Prox Graft lesion is 100% stenosed.  SVG.  Prox Graft lesion is 90% stenosed.  A drug-eluting stent was successfully placed using a STENT SYNERGY DES 3X16.  Post intervention, there is a 10% residual stenosis.  1.  Severe stenosis of the native left mainstem, treated successfully with PCI using a 3.0 x 16 mm Synergy DES postdilated to high pressure with a 3.25 mm noncompliant balloon 2.  Chronic occlusion of the LAD supplied by a widely patent LIMA graft 3.  Severe stenosis of the saphenous vein graft to first OM with total occlusion of the OM branch just beyond the coronary anastomosis, unchanged from the previous study 4.  Patent saphenous vein graft to first diagonal supplying collaterals to the obtuse marginal branch 5.  Known chronic occlusion of the native RCA and saphenous vein graft RCA, not selectively injected Recommend uninterrupted dual antiplatelet therapy with Aspirin 81mg  daily and Clopidogrel 75mg  daily for a minimum of 12 months (ACS - Class I recommendation).  Echo 05/12/18: EF 15%, diffuse HK, LA severely dilated, RV moderately reduced, RA moderately dilated, mod TR, PA peak pressure 44 mm Hg  Discharge Weight: 168 pounds  Discharge Vitals: Blood pressure (!) 147/107,  pulse 93, temperature 97.6 F (36.4 C), temperature source Oral, resp. rate 19, height 5\' 11"  (1.803 m), weight 76.4 kg, SpO2 98 %.  Labs: Lab Results  Component Value Date   WBC 9.5 05/19/2018   HGB 13.2 05/19/2018   HCT 42.2 05/19/2018   MCV 83.4 05/19/2018   PLT 217 05/19/2018    Recent Labs  Lab 05/21/18 0546  NA 128*  K 5.1  CL 96*  CO2 23  BUN 18  CREATININE 1.55*  CALCIUM 8.9  GLUCOSE 107*   Lab Results  Component Value Date   CHOL 204 (H)  05/13/2018   HDL 31 (L) 05/13/2018   LDLCALC 159 (H) 05/13/2018   TRIG 68 05/13/2018   BNP (last 3 results) Recent Labs    05/12/18 1035  BNP 2,066.0*    ProBNP (last 3 results) No results for input(s): PROBNP in the last 8760 hours.   Diagnostic Studies/Procedures   LHC 05/18/2018   Mid LM lesion is 90% stenosed.  Prox LAD lesion is 100% stenosed.  Ost 2nd Mrg to 2nd Mrg lesion is 100% stenosed.  2nd Mrg lesion is 99% stenosed.  Prox Cx to Mid Cx lesion is 30% stenosed.  Ost RCA to Prox RCA lesion is 100% stenosed.  Mid Graft lesion is 30% stenosed.  Origin to Prox Graft lesion is 100% stenosed.  SVG.  Prox Graft lesion is 90% stenosed.  A drug-eluting stent was successfully placed using a STENT SYNERGY DES 3X16.  Post intervention, there is a 10% residual stenosis.   1.  Severe stenosis of the native left mainstem, treated successfully with PCI using a 3.0 x 16 mm Synergy DES postdilated to high pressure with a 3.25 mm noncompliant balloon 2.  Chronic occlusion of the LAD supplied by a widely patent LIMA graft 3.  Severe stenosis of the saphenous vein graft to first OM with total occlusion of the OM branch just beyond the coronary anastomosis, unchanged from the previous study 4.  Patent saphenous vein graft to first diagonal supplying collaterals to the obtuse marginal branch 5.  Known chronic occlusion of the native RCA and saphenous vein graft RCA, not selectively injected  Recommend uninterrupted dual antiplatelet therapy with Aspirin 81mg  daily and Clopidogrel 75mg  daily for a minimum of 12 months (ACS - Class I recommendation).   Discharge Medications   Allergies as of 05/21/2018      Reactions   Entresto [sacubitril-valsartan]    Dizziness, sick on stomach      Medication List    STOP taking these medications   albuterol 108 (90 Base) MCG/ACT inhaler Commonly known as:  PROVENTIL HFA;VENTOLIN HFA   carvedilol 12.5 MG tablet Commonly known  as:  COREG   lisinopril 10 MG tablet Commonly known as:  PRINIVIL,ZESTRIL     TAKE these medications   aspirin EC 81 MG tablet Take 1 tablet (81 mg total) by mouth daily.   atorvastatin 80 MG tablet Commonly known as:  LIPITOR Take 1 tablet (80 mg total) by mouth every evening.   citalopram 20 MG tablet Commonly known as:  CELEXA Take 1 tablet (20 mg total) by mouth daily.   clopidogrel 75 MG tablet Commonly known as:  PLAVIX Take 1 tablet (75 mg total) by mouth daily with breakfast. Start taking on:  05/22/2018   digoxin 0.125 MG tablet Commonly known as:  LANOXIN Take 1 tablet (0.125 mg total) by mouth daily. Start taking on:  05/22/2018   ezetimibe 10 MG tablet Commonly known  as:  ZETIA Take 1 tablet (10 mg total) by mouth daily.   furosemide 20 MG tablet Commonly known as:  LASIX Take 2 tablets (40 mg total) by mouth daily. What changed:  how much to take   hydrALAZINE 100 MG tablet Commonly known as:  APRESOLINE Take 1 tablet (100 mg total) by mouth every 8 (eight) hours.   isosorbide mononitrate 60 MG 24 hr tablet Commonly known as:  IMDUR Take 1 tablet (60 mg total) by mouth daily. Start taking on:  05/22/2018   nitroGLYCERIN 0.4 MG SL tablet Commonly known as:  NITROSTAT Place 1 tablet (0.4 mg total) under the tongue every 5 (five) minutes x 3 doses as needed for chest pain.   omeprazole 20 MG tablet Commonly known as:  PRILOSEC OTC Take 20 mg by mouth daily.   spironolactone 25 MG tablet Commonly known as:  ALDACTONE Take 1 tablet (25 mg total) by mouth daily.       Disposition   The patient will be discharged in stable condition to home. Discharge Instructions    (HEART FAILURE PATIENTS) Call MD:  Anytime you have any of the following symptoms: 1) 3 pound weight gain in 24 hours or 5 pounds in 1 week 2) shortness of breath, with or without a dry hacking cough 3) swelling in the hands, feet or stomach 4) if you have to sleep on extra pillows  at night in order to breathe.   Complete by:  As directed    Amb Referral to Cardiac Rehabilitation   Complete by:  As directed    Diagnosis:   Coronary Stents NSTEMI Heart Failure (see criteria below if ordering Phase II)     Heart Failure Type:  Chronic Systolic & Diastolic   Diet - low sodium heart healthy   Complete by:  As directed    Heart Failure patients record your daily weight using the same scale at the same time of day   Complete by:  As directed    Increase activity slowly   Complete by:  As directed      Follow-up Information    [facility]. Go on 06/01/2018.   Specialty:  Cardiology Why:  at 3 pm in the Advanced Heart Failure Clinic.  Please bring all medications to appt.  Gate code is 1900 for Dec. Contact information: [address] [id number] [place] [address] [phone]            Duration of Discharge Encounter: Greater than 35 minutes   Signed, Tonye Becket NP-C  05/21/2018, 12:08 PM  Patient seen and examined with the above-signed Advanced Practice Provider and/or Housestaff. I personally reviewed laboratory data, imaging studies and relevant notes. I independently examined the patient and formulated the important aspects of the plan. I have edited the note to reflect any of my changes or salient points. I have personally discussed the plan with the patient and/or family.  He is much improved. Co-ox ok off milrinone. Creatinine stable. No CP. BP improved. Will d/c today. K high so will not restart ACE. Consider losartan or Entresto on f/u. He has severe LV dysfunction and I am concerned short-term prognosis will be poor. He has been non-compliant with f/u and will not quailify for advanced therapies at this point.   Arvilla Meres, MD  4:23 PM

## 2018-05-20 NOTE — Plan of Care (Signed)
  Problem: Education: Goal: Knowledge of General Education information will improve Description Including pain rating scale, medication(s)/side effects and non-pharmacologic comfort measures Outcome: Progressing   Problem: Health Behavior/Discharge Planning: Goal: Ability to manage health-related needs will improve Outcome: Progressing   Problem: Clinical Measurements: Goal: Ability to maintain clinical measurements within normal limits will improve Outcome: Progressing Goal: Will remain free from infection Outcome: Progressing Goal: Diagnostic test results will improve Outcome: Progressing Goal: Respiratory complications will improve Outcome: Progressing Goal: Cardiovascular complication will be avoided Outcome: Progressing   Problem: Nutrition: Goal: Adequate nutrition will be maintained Outcome: Progressing   Problem: Coping: Goal: Level of anxiety will decrease Outcome: Progressing   Problem: Elimination: Goal: Will not experience complications related to bowel motility Outcome: Progressing   Problem: Education: Goal: Ability to demonstrate management of disease process will improve Outcome: Progressing Goal: Ability to verbalize understanding of medication therapies will improve Outcome: Progressing Goal: Individualized Educational Video(s) Outcome: Progressing   Problem: Activity: Goal: Capacity to carry out activities will improve Outcome: Progressing   Problem: Cardiac: Goal: Ability to achieve and maintain adequate cardiopulmonary perfusion will improve Outcome: Progressing   Problem: Education: Goal: Knowledge of disease or condition will improve Outcome: Progressing Goal: Understanding of discharge needs will improve Outcome: Progressing   Problem: Health Behavior/Discharge Planning: Goal: Ability to identify changes in lifestyle to reduce recurrence of condition will improve Outcome: Progressing Goal: Identification of resources available to  assist in meeting health care needs will improve Outcome: Progressing   Problem: Physical Regulation: Goal: Complications related to the disease process, condition or treatment will be avoided or minimized Outcome: Progressing   Problem: Safety: Goal: Ability to remain free from injury will improve Outcome: Progressing

## 2018-05-20 NOTE — Progress Notes (Signed)
[id number] Offered to walk with pt but he stated he is very tired from not sleeping. Encouraged pt to walk with staff later. Gave walking instructions for ex and gave CHF booklet. Will continue to follow. Luetta Nutting RN BSN 05/20/2018 10:21 AM

## 2018-05-20 NOTE — Progress Notes (Addendum)
Advanced Heart Failure Rounding Note  PCP-Cardiologist: No primary care provider on file.   Subjective:    S/P PCI DES to L main on 11/19   Yesterday milrinone was cut back to 0.125 mcg. CO-OX 63%.   Denies SOB, orthopnea or PND. No CP.  Wants to go home.    Objective:   Weight Range: 76.6 kg Body mass index is 23.55 kg/m.   Vital Signs:   Temp:  [97.1 F (36.2 C)-98 F (36.7 C)] 97.4 F (36.3 C) (11/21 0821) Pulse Rate:  [88-106] 102 (11/21 0903) Resp:  [13-24] 15 (11/21 0830) BP: (108-154)/(82-108) 154/100 (11/21 0821) SpO2:  [97 %-100 %] 98 % (11/21 0830) Weight:  [76.6 kg] 76.6 kg (11/21 0100) Last BM Date: 05/18/18  Weight change: Filed Weights   05/18/18 0430 05/19/18 0300 05/20/18 0100  Weight: 75.6 kg 76 kg 76.6 kg    Intake/Output:   Intake/Output Summary (Last 24 hours) at 05/20/2018 0916 Last data filed at 05/20/2018 1610 Gross per 24 hour  Intake 708.26 ml  Output 1410 ml  Net -701.74 ml      Physical Exam   General:  Well appearing. No resp difficulty. Walking in the room.  HEENT: normal Neck: supple. no JVD. Carotids 2+ bilat; no bruits. No lymphadenopathy or thryomegaly appreciated. Cor: PMI nondisplaced. Regular rate & rhythm. No rubs, or murmurs. + S3  Lungs: clear Abdomen: soft, nontender, nondistended. No hepatosplenomegaly. No bruits or masses. Good bowel sounds. Extremities: no cyanosis, clubbing, rash, edema. RUE PICC Neuro: alert & orientedx3, cranial nerves grossly intact. moves all 4 extremities w/o difficulty. Affect pleasant   Telemetry   NSR 80-90s. Personally reviewed.   EKG    No new tracings.  Labs    CBC Recent Labs    05/19/18 0539  WBC 9.5  HGB 13.2  HCT 42.2  MCV 83.4  PLT 217   Basic Metabolic Panel Recent Labs    96/04/54 0539 05/20/18 0301  NA 128* 128*  K 4.6 4.8  CL 93* 95*  CO2 24 24  GLUCOSE 141* 103*  BUN 19 21*  CREATININE 1.56* 1.61*  CALCIUM 8.9 8.8*   Liver Function  Tests No results for input(s): AST, ALT, ALKPHOS, BILITOT, PROT, ALBUMIN in the last 72 hours. No results for input(s): LIPASE, AMYLASE in the last 72 hours. Cardiac Enzymes No results for input(s): CKTOTAL, CKMB, CKMBINDEX, TROPONINI in the last 72 hours.  BNP: BNP (last 3 results) Recent Labs    05/12/18 1035  BNP 2,066.0*    ProBNP (last 3 results) No results for input(s): PROBNP in the last 8760 hours.   D-Dimer No results for input(s): DDIMER in the last 72 hours. Hemoglobin A1C No results for input(s): HGBA1C in the last 72 hours. Fasting Lipid Panel No results for input(s): CHOL, HDL, LDLCALC, TRIG, CHOLHDL, LDLDIRECT in the last 72 hours. Thyroid Function Tests No results for input(s): TSH, T4TOTAL, T3FREE, THYROIDAB in the last 72 hours.  Invalid input(s): FREET3  Other results:   Imaging    No results found.   Medications:     Scheduled Medications: . aspirin EC  81 mg Oral Daily  . atorvastatin  80 mg Oral QPM  . citalopram  20 mg Oral Daily  . clopidogrel  75 mg Oral Q breakfast  . digoxin  0.125 mg Oral Daily  . enoxaparin (LOVENOX) injection  40 mg Subcutaneous QHS  . ezetimibe  10 mg Oral Daily  . furosemide  40 mg Oral  Daily  . hydrALAZINE  50 mg Oral Q8H  . isosorbide mononitrate  30 mg Oral Daily  . potassium chloride  40 mEq Oral BID  . sodium chloride flush  10-40 mL Intracatheter Q12H  . sodium chloride flush  3 mL Intravenous Q12H  . spironolactone  25 mg Oral Daily    Infusions: . sodium chloride      PRN Medications: sodium chloride, acetaminophen, albuterol, cyclobenzaprine, nitroGLYCERIN, ondansetron (ZOFRAN) IV, sodium chloride flush, sodium chloride flush    Patient Profile   Randy Propstis a 57 y.o.malewith history of combined HF (diagnosed 03/2017), HTN, HLD, CAD s/p CABG2010 and PCI (last ~2014), former tobacco use, CVA, and AAA.  Assessment/Plan   1. Acute on chronic systolic HF.Echo in 2017 showed EF  55% -Echo 03/2017: EF 30-35% with grade 2 DD. - Echo 01/20/18: EF 30-35%, grade 2 DD, RV mildly reduced. - Echo 05/12/18: EF 15%, diffuse HK, LA severely dilated, RV moderately reduced, RA moderately dilated, mod TR, PA peak pressure 44 mm Hg -CO-OX stable at 63%. Stop milrinone.  - Volume status stable. Continue lasix 40 mg daily.  -Continue digoxin 0.125mg  daily.  - No coreg with low output. - Continue spiro 25 mg daily -Increase hydralazine to 75 mg TID. Continue imdur 30 mg daily.  - ACE on hold with AKI. Prior to admit he was taking lisinopril 10 mg daily.   Did not tolerate with Entresto with vomiting after each dose.  2. CAD s/p CABG 2010 -> NSTEMI - LHC 12/29/17 showed patent LIMA to LAD, SVG to RCA occluded, SVG to OM-2 with 95% prox lesion. Has unprotected AV-groove LCX which feeds collaterals to distal RCA.  - 11/20 S/P DES to left main.  - Plan for dual antiplatelet therapy with asa mg daily + plavix for minimum of 12 months.   3. Carotid stenosis - Carotid US 04/14/16: Right ICA 80-99%, Left ICA upper range <50% vs low range >50%. - Asymptomatic. No change.  - Plan for carotid stent at some point. [facility] has seen and re-ordered carotid u/s which suggests total occlusion of R ICA with 50-75% LICA    4. HTN - Stable.   5. Acute on CKD - Baseline ~1.2-1.4 - Creatinine stable 1.6   6. ?OSA - Needs sleep study outpatient. No change.   7. Alcohol abuse - No ETOH in 3 weeks. Was drinking 1 6 pack beer/week prior. No change.   8. Hyperlipidemia - LDL 159. Continue statin.   He would like meds to bed with TOC. He has Medicaid and will be able to pay for the Co-pay.  Anticipate d/c tomorrow.    Medication concerns reviewed with patient and pharmacy team. Barriers identified: compliance with follow up.    Length of Stay: 7  Amy Clegg, NP  05/20/2018, 9:16 AM  Advanced Heart Failure Team Pager [phone] (M-F; 7a - 4p)  Please contact [facility] for  night-coverage after hours (4p -7a ) and weekends on [email or web address]   Patient seen and examined with the above-signed Advanced Practice Provider and/or Housestaff. I personally reviewed laboratory data, imaging studies and relevant notes. I independently examined the patient and formulated the important aspects of the plan. I have edited the note to reflect any of my changes or salient points. I have personally discussed the plan with the patient and/or family.  Doing well on IV milrinone 0.125. Co-ox 63%. Denies SOB, orthopnea or PND. Volume status looks good. Creatinine stable. BP still up. Will increase hydralazine. Stop milrinone  today. Follow co-ox and renal function. No bleeding on Plavix.   Arvilla Meres, MD  4:01 PM

## 2018-05-21 LAB — BASIC METABOLIC PANEL
Anion gap: 9 (ref 5–15)
BUN: 18 mg/dL (ref 6–20)
CO2: 23 mmol/L (ref 22–32)
CREATININE: 1.55 mg/dL — AB (ref 0.61–1.24)
Calcium: 8.9 mg/dL (ref 8.9–10.3)
Chloride: 96 mmol/L — ABNORMAL LOW (ref 98–111)
GFR calc non Af Amer: 47 mL/min — ABNORMAL LOW (ref >60)
GFR, EST AFRICAN AMERICAN: 55 mL/min — AB (ref >60)
Glucose, Bld: 107 mg/dL — ABNORMAL HIGH (ref 70–99)
Potassium: 5.1 mmol/L (ref 3.5–5.1)
Sodium: 128 mmol/L — ABNORMAL LOW (ref 135–145)

## 2018-05-21 LAB — COOXEMETRY PANEL
CARBOXYHEMOGLOBIN: 1.5 % (ref 0.5–1.5)
METHEMOGLOBIN: 1.5 % (ref 0.0–1.5)
O2 SAT: 61.5 %
Total hemoglobin: 13.8 g/dL (ref 12.0–16.0)

## 2018-05-21 MED ORDER — CLOPIDOGREL BISULFATE 75 MG PO TABS: 75.0000 mg | ORAL_TABLET | Freq: Every day | ORAL | 6 refills | Status: DC

## 2018-05-21 MED ORDER — FUROSEMIDE 40 MG PO TABS: 40.0000 mg | ORAL_TABLET | Freq: Every day | ORAL | 6 refills | Status: DC

## 2018-05-21 MED ORDER — HYDRALAZINE HCL 50 MG PO TABS: 100.0000 mg | ORAL_TABLET | Freq: Three times a day (TID) | ORAL | Status: DC

## 2018-05-21 MED ORDER — DIGOXIN 125 MCG PO TABS: 0.1250 mg | ORAL_TABLET | Freq: Every day | ORAL | 6 refills | Status: DC

## 2018-05-21 MED ORDER — ISOSORBIDE MONONITRATE ER 60 MG PO TB24: 60.0000 mg | ORAL_TABLET | Freq: Every day | ORAL | Status: DC

## 2018-05-21 MED ORDER — NITROGLYCERIN 0.4 MG SL SUBL: 0.4000 mg | SUBLINGUAL_TABLET | SUBLINGUAL | 12 refills | Status: DC | PRN

## 2018-05-21 MED ORDER — FUROSEMIDE 20 MG PO TABS: 40.0000 mg | ORAL_TABLET | Freq: Every day | ORAL | 6 refills | Status: DC

## 2018-05-21 MED ORDER — HYDRALAZINE HCL 100 MG PO TABS: 100.0000 mg | ORAL_TABLET | Freq: Three times a day (TID) | ORAL | 6 refills | Status: DC

## 2018-05-21 MED ORDER — ISOSORBIDE MONONITRATE ER 60 MG PO TB24: 60.0000 mg | ORAL_TABLET | Freq: Every day | ORAL | 6 refills | Status: DC

## 2018-05-21 MED FILL — DIGOXIN 0.125 MG TABLET: 125 | 30 days supply | Qty: 30 | Fill #0

## 2018-05-21 MED FILL — CLOPIDOGREL 75 MG TABLET: 75 | 30 days supply | Qty: 30 | Fill #0

## 2018-05-21 MED FILL — ISOSORBIDE MN ER 60 MG TAB: 60 | 30 days supply | Qty: 30 | Fill #0

## 2018-05-21 MED FILL — FUROSEMIDE 40 MG TABLET: 40 | 30 days supply | Qty: 30 | Fill #0

## 2018-05-21 MED FILL — hydrALAZINE HCL 100 MG TABS: 100 | 30 days supply | Qty: 90 | Fill #0

## 2018-05-21 MED FILL — NITROGLYCERIN 0.4 MG TAB SL: 0.4 | 8 days supply | Qty: 25 | Fill #0

## 2018-05-21 NOTE — Care Management Note (Addendum)
Case Management Note  Patient Details  Name: Randy [name] MRN: [id number] Date of Birth: [birth year]  Subjective/Objective:  Pt admitted HF                  Action/Plan:   PTA independent from home - will go and stay with his daughter at discharge.  Pt informed CM that he has a PCP and denied barriers with paying for medications. TOC will deliver pts medications to bedside prior to discharge.  NO CM Needs at discharge   Expected Discharge Date:  05/21/18               Expected Discharge Plan:  Home w Home Health Services  In-House Referral:     Discharge planning Services  CM Consult  Post Acute Care Choice:    Choice offered to:     DME Arranged:    DME Agency:     HH Arranged:    HH Agency:     Status of Service:  Completed, signed off  If discussed at [employer], dates discussed:    Additional Comments:  Cherylann Parr, RN 05/21/2018, 10:39 AM

## 2018-05-21 NOTE — Progress Notes (Signed)
IV team removed PICC line and Pt received discharge instructions. Pt doesn't take albuterol puff at home. NP Amy notified that regarding this. She will take out albuterol medication list. Pt received medications from pharmacy. He took his all belongings. HS [employer]

## 2018-05-21 NOTE — Progress Notes (Signed)
CARDIAC REHAB PHASE I   PRE:  Rate/Rhythm: 93 SR  BP:  Supine:   Sitting: 163/101  Standing:    SaO2: 96%RA  MODE:  Ambulation: 300 ft   POST:  Rate/Rhythm: 103 ST  BP:  Supine:   Sitting: 156/100  Standing:    SaO2: 97%RA 1025-1042 Noted BP has been elevated during night and still up. Pt walked 300 ft on RA with steady gait. Tolerated well. Back to recliner after walk. Wants to go home.   Luetta Nutting, RN BSN  05/21/2018 10:41 AM

## 2018-05-21 NOTE — Progress Notes (Addendum)
Advanced Heart Failure Rounding Note  PCP-Cardiologist: No primary care provider on file.   Subjective:    S/P PCI DES to L main on 11/19   CO-OX 62%. Yesterday hydralazine was increased.   Denies SOB or CP.  Wants to go home.    Objective:   Weight Range: 76.4 kg Body mass index is 23.49 kg/m.   Vital Signs:   Temp:  [97.3 F (36.3 C)-97.9 F (36.6 C)] 97.6 F (36.4 C) (11/22 0758) Pulse Rate:  [89-96] 93 (11/22 0923) Resp:  [18-21] 19 (11/22 0758) BP: (120-148)/(76-107) 147/107 (11/22 0758) SpO2:  [98 %] 98 % (11/22 0758) Weight:  [76.4 kg] 76.4 kg (11/22 0758) Last BM Date: 05/20/18  Weight change: Filed Weights   05/19/18 0300 05/20/18 0100 05/21/18 0758  Weight: 76 kg 76.6 kg 76.4 kg    Intake/Output:   Intake/Output Summary (Last 24 hours) at 05/21/2018 0924 Last data filed at 05/21/2018 0900 Gross per 24 hour  Intake 493 ml  Output 1750 ml  Net -1257 ml      Physical Exam  General:  No resp difficulty HEENT: normal Neck: supple. JVP 5-6 . Carotids 2+ bilat; no bruits. No lymphadenopathy or thryomegaly appreciated. Cor: PMI nondisplaced. Regular rate & rhythm. No rubs, gallops or murmurs. Lungs: clear Abdomen: soft, nontender, nondistended. No hepatosplenomegaly. No bruits or masses. Good bowel sounds. Extremities: no cyanosis, clubbing, rash, edema. RUE PICC  Neuro: alert & orientedx3, cranial nerves grossly intact. moves all 4 extremities w/o difficulty. Affect pleasant   Telemetry   NSR 80-90s   EKG    No new tracings.  Labs    CBC Recent Labs    05/19/18 0539  WBC 9.5  HGB 13.2  HCT 42.2  MCV 83.4  PLT 217   Basic Metabolic Panel Recent Labs    40/98/11 0301 05/21/18 0546  NA 128* 128*  K 4.8 5.1  CL 95* 96*  CO2 24 23  GLUCOSE 103* 107*  BUN 21* 18  CREATININE 1.61* 1.55*  CALCIUM 8.8* 8.9   Liver Function Tests No results for input(s): AST, ALT, ALKPHOS, BILITOT, PROT, ALBUMIN in the last 72 hours. No  results for input(s): LIPASE, AMYLASE in the last 72 hours. Cardiac Enzymes No results for input(s): CKTOTAL, CKMB, CKMBINDEX, TROPONINI in the last 72 hours.  BNP: BNP (last 3 results) Recent Labs    05/12/18 1035  BNP 2,066.0*    ProBNP (last 3 results) No results for input(s): PROBNP in the last 8760 hours.   D-Dimer No results for input(s): DDIMER in the last 72 hours. Hemoglobin A1C No results for input(s): HGBA1C in the last 72 hours. Fasting Lipid Panel No results for input(s): CHOL, HDL, LDLCALC, TRIG, CHOLHDL, LDLDIRECT in the last 72 hours. Thyroid Function Tests No results for input(s): TSH, T4TOTAL, T3FREE, THYROIDAB in the last 72 hours.  Invalid input(s): FREET3  Other results:   Imaging    No results found.   Medications:     Scheduled Medications: . aspirin EC  81 mg Oral Daily  . atorvastatin  80 mg Oral QPM  . citalopram  20 mg Oral Daily  . clopidogrel  75 mg Oral Q breakfast  . digoxin  0.125 mg Oral Daily  . enoxaparin (LOVENOX) injection  40 mg Subcutaneous QHS  . ezetimibe  10 mg Oral Daily  . furosemide  40 mg Oral Daily  . hydrALAZINE  75 mg Oral Q8H  . isosorbide mononitrate  30 mg Oral Daily  .  potassium chloride  40 mEq Oral BID  . sodium chloride flush  10-40 mL Intracatheter Q12H  . sodium chloride flush  3 mL Intravenous Q12H  . spironolactone  25 mg Oral Daily    Infusions: . sodium chloride      PRN Medications: sodium chloride, acetaminophen, albuterol, cyclobenzaprine, nitroGLYCERIN, ondansetron (ZOFRAN) IV, sodium chloride flush, sodium chloride flush    Patient Profile   Randy Propstis a 57 y.o.malewith history of combined HF (diagnosed 03/2017), HTN, HLD, CAD s/p CABG2010 and PCI (last ~2014), former tobacco use, CVA, and AAA.  Assessment/Plan   1. Acute on chronic systolic HF.Echo in 2017 showed EF 55% -Echo 03/2017: EF 30-35% with grade 2 DD. - Echo 01/20/18: EF 30-35%, grade 2 DD, RV mildly  reduced. - Echo 05/12/18: EF 15%, diffuse HK, LA severely dilated, RV moderately reduced, RA moderately dilated, mod TR, PA peak pressure 44 mm Hg -Volume status stable. Continue lasix 40 mg daily.  -Continue digoxin 0.125mg  daily.  - No coreg with low output. - Continue spiro 25 mg daily -Increase 100 mg tid and increase imdur to 60 mg daily.   - ACE on hold with AKI. Prior to admit he was taking lisinopril 10 mg daily. Hold off with elevated potassium. Stop K supplement. Anticipate starting at his follow up.    Did not tolerate with Entresto with vomiting after each dose.  2. CAD s/p CABG 2010 -> NSTEMI - LHC 12/29/17 showed patent LIMA to LAD, SVG to RCA occluded, SVG to OM-2 with 95% prox lesion. Has unprotected AV-groove LCX which feeds collaterals to distal RCA.  - 11/20 S/P DES to left main.  - No chest pain.  - Plan for dual antiplatelet therapy with asa mg daily + plavix for minimum of 12 months.   3. Carotid stenosis - Carotid US 04/14/16: Right ICA 80-99%, Left ICA upper range <50% vs low range >50%. - Asymptomatic. No change.  - Plan for carotid stent at some point. [facility] has seen and re-ordered carotid u/s which suggests total occlusion of R ICA with 50-75% LICA    4. HTN - Stable.   5. Acute on CKD - Baseline ~1.2-1.4 - Creatinine stable 1.55.   6. ?OSA - Needs sleep study outpatient. No change.   7. Alcohol abuse - No ETOH in 3 weeks. Was drinking 1 6 pack beer/week prior. No change.   8. Hyperlipidemia - LDL 159. Continue statin.   He would like meds to bed with TOC. He has Medicaid and will be able to pay for the Co-pay.     Medication concerns reviewed with patient and pharmacy team. Barriers identified: compliance with follow up.    Length of Stay: 8  Amy Clegg, NP  05/21/2018, 9:24 AM  Advanced Heart Failure Team Pager [phone] (M-F; 7a - 4p)  Please contact [facility] for night-coverage after hours (4p -7a ) and weekends on  [email or web address]  Patient seen and examined with the above-signed Advanced Practice Provider and/or Housestaff. I personally reviewed laboratory data, imaging studies and relevant notes. I independently examined the patient and formulated the important aspects of the plan. I have edited the note to reflect any of my changes or salient points. I have personally discussed the plan with the patient and/or family.  He is much improved. Co-ox ok off milrinone. Creatinine stable. No CP. BP improved. Will d/c today. K high so will not restart ACE. Consider losartan or Entresto on f/u. He has severe LV dysfunction and I am  concerned short-term prognosis will be poor. He has been non-compliant with f/u and will not quailify for advanced therapies at this point.   Arvilla Meres, MD  4:23 PM

## 2018-05-24 ENCOUNTER — Telehealth (HOSPITAL_COMMUNITY): Payer: Self-pay

## 2018-05-24 NOTE — Telephone Encounter (Signed)
Pt insurance is active through [place]. Ref#[id number]  Will fax over [facility] Reimbursement form to Dr. Excell Seltzer.  Will contact patient to see if he is interested in the Cardiac Rehab Program. If interested, patient will need to complete follow up appt. Once completed, patient will be contacted for scheduling upon review by the RN Navigator.

## 2018-05-25 NOTE — Telephone Encounter (Signed)
Attempted to call patient in regards to Cardiac Rehab - LM on VM 

## 2018-06-01 ENCOUNTER — Encounter (HOSPITAL_COMMUNITY): Payer: Self-pay

## 2018-06-01 ENCOUNTER — Ambulatory Visit (HOSPITAL_COMMUNITY)
Admission: RE | Admit: 2018-06-01 | Discharge: 2018-06-01 | Disposition: A | Discharge status: Home / Self Care | Payer: Medicaid Other | Source: Ambulatory Visit | Attending: Cardiology | Admitting: Cardiology

## 2018-06-01 VITALS — BP 162/104 | HR 100 | Ht 70.5 in | Wt 192.4 lb

## 2018-06-01 DIAGNOSIS — I5022 Chronic systolic (congestive) heart failure: Secondary | ICD-10-CM

## 2018-06-01 DIAGNOSIS — F419 Anxiety disorder, unspecified: Secondary | ICD-10-CM | POA: Diagnosis not present

## 2018-06-01 DIAGNOSIS — Z720 Tobacco use: Secondary | ICD-10-CM | POA: Diagnosis not present

## 2018-06-01 DIAGNOSIS — I2510 Atherosclerotic heart disease of native coronary artery without angina pectoris: Secondary | ICD-10-CM | POA: Insufficient documentation

## 2018-06-01 DIAGNOSIS — I6523 Occlusion and stenosis of bilateral carotid arteries: Secondary | ICD-10-CM | POA: Insufficient documentation

## 2018-06-01 DIAGNOSIS — I5042 Chronic combined systolic (congestive) and diastolic (congestive) heart failure: Secondary | ICD-10-CM | POA: Diagnosis not present

## 2018-06-01 DIAGNOSIS — Z87891 Personal history of nicotine dependence: Secondary | ICD-10-CM | POA: Diagnosis not present

## 2018-06-01 DIAGNOSIS — Z7902 Long term (current) use of antithrombotics/antiplatelets: Secondary | ICD-10-CM | POA: Insufficient documentation

## 2018-06-01 DIAGNOSIS — N189 Chronic kidney disease, unspecified: Secondary | ICD-10-CM | POA: Diagnosis not present

## 2018-06-01 DIAGNOSIS — E785 Hyperlipidemia, unspecified: Secondary | ICD-10-CM | POA: Diagnosis not present

## 2018-06-01 DIAGNOSIS — Z833 Family history of diabetes mellitus: Secondary | ICD-10-CM | POA: Diagnosis not present

## 2018-06-01 DIAGNOSIS — K219 Gastro-esophageal reflux disease without esophagitis: Secondary | ICD-10-CM | POA: Insufficient documentation

## 2018-06-01 DIAGNOSIS — Z8673 Personal history of transient ischemic attack (TIA), and cerebral infarction without residual deficits: Secondary | ICD-10-CM | POA: Insufficient documentation

## 2018-06-01 DIAGNOSIS — Z79899 Other long term (current) drug therapy: Secondary | ICD-10-CM | POA: Insufficient documentation

## 2018-06-01 DIAGNOSIS — F1010 Alcohol abuse, uncomplicated: Secondary | ICD-10-CM | POA: Diagnosis not present

## 2018-06-01 DIAGNOSIS — I130 Hypertensive heart and chronic kidney disease with heart failure and stage 1 through stage 4 chronic kidney disease, or unspecified chronic kidney disease: Secondary | ICD-10-CM | POA: Diagnosis not present

## 2018-06-01 DIAGNOSIS — R9431 Abnormal electrocardiogram [ECG] [EKG]: Secondary | ICD-10-CM | POA: Diagnosis not present

## 2018-06-01 DIAGNOSIS — I10 Essential (primary) hypertension: Secondary | ICD-10-CM

## 2018-06-01 DIAGNOSIS — Z8249 Family history of ischemic heart disease and other diseases of the circulatory system: Secondary | ICD-10-CM | POA: Diagnosis not present

## 2018-06-01 DIAGNOSIS — Z7982 Long term (current) use of aspirin: Secondary | ICD-10-CM | POA: Insufficient documentation

## 2018-06-01 DIAGNOSIS — Z951 Presence of aortocoronary bypass graft: Secondary | ICD-10-CM | POA: Diagnosis not present

## 2018-06-01 DIAGNOSIS — Z8349 Family history of other endocrine, nutritional and metabolic diseases: Secondary | ICD-10-CM | POA: Diagnosis not present

## 2018-06-01 DIAGNOSIS — Z889 Allergy status to unspecified drugs, medicaments and biological substances status: Secondary | ICD-10-CM | POA: Diagnosis not present

## 2018-06-01 LAB — BASIC METABOLIC PANEL
Anion gap: 12 (ref 5–15)
BUN: 16 mg/dL (ref 6–20)
CALCIUM: 8.8 mg/dL — AB (ref 8.9–10.3)
CHLORIDE: 90 mmol/L — AB (ref 98–111)
CO2: 29 mmol/L (ref 22–32)
CREATININE: 1.49 mg/dL — AB (ref 0.61–1.24)
GFR calc non Af Amer: 51 mL/min — ABNORMAL LOW (ref >60)
GFR, EST AFRICAN AMERICAN: 59 mL/min — AB (ref >60)
GLUCOSE: 108 mg/dL — AB (ref 70–99)
Potassium: 3.1 mmol/L — ABNORMAL LOW (ref 3.5–5.1)
Sodium: 131 mmol/L — ABNORMAL LOW (ref 135–145)

## 2018-06-01 MED ORDER — FUROSEMIDE 20 MG PO TABS: 60.0000 mg | ORAL_TABLET | Freq: Every day | ORAL | 3 refills | Status: DC

## 2018-06-01 MED ORDER — FUROSEMIDE 10 MG/ML IJ SOLN
80.0000 mg | Freq: Once | INTRAMUSCULAR | Status: AC
  Administered 2018-06-01: 80 mg via INTRAVENOUS
  Filled 2018-06-01: qty 8

## 2018-06-01 NOTE — Addendum Note (Signed)
Encounter addended by: Sherald Hess, NP on: 06/01/2018 9:23 PM  Actions taken: Sign clinical note

## 2018-06-01 NOTE — Progress Notes (Signed)
Advanced Heart Failure Clinic Note   Referring Physician: Dr. Mosetta Putt PCP: Howard Pouch, MD HF Cardiologist: Dr [name] VVS: Dr Randie Heinz   HPI: Randy [name] is a 57 y.o. male with history of combined HF (diagnosed 03/2017), HTN, HLD, CAD s/p CABG 2010 and PCI (last ~2014), former tobacco use, CVA, and AAA. He is intolerant entresto.   Admitted 03/2017 with CHF exacerbation. Echo showed EF 30-35% with grade 2 DD. Cardiology was consulted and he refused [facility] for further work up.   Quit smoking Jan 1. Smoked for ~50 years prior 1 ppd. ETOH use (Bud Light) 18 pack/week, decreased from 18-24 beers daily. Has not smoked or had alcohol since 05/12/2018.   He was initially seen in the HF clinic June 2019. At that time he was set up for heart cath and switched to entresto. He was not on spiro with a history of hyperkalemia. He underwent LHC/RHC in July. He did not require intervention. Reduced EF was thought to be related to ETOH abuse.   Admitted to [facility] 05/12/2018 with increased dyspnea and chest pain. Elevated troponin was thought to be in the setting of demand ischemia. He has an ECHO completed that showed severely reduced EF 15%. Diuresed with IV lasix but had poor response so milrinone was added. Diuresed over 20 pounds. Once adequately diuresed interventional cardiology took him to the cath lab and he underwent PCI DES to left main. After heart catherization, milrinone was gradually weaned off. He will continue on aspirin and plavix for 1 year.  He was not on bb due to low output. He was not on entresto due to intolerance and not on an ace due to elevated K and creatinine. PICC line was removed prior to discharge. Vascular consulted and repeated carotid U/S. This suggested total occlusion R ICA and 50-75% LICA. He did not require surgical intervention but will continue statin and follow up with [facility]VS in 1 year. Discharge weight was 168 pounds.   Today he returns for HF follow up. Yesterday he was given 80  mg IV lasix in the clinic for marked volume overload. Overall feeling much better. Denies SOB/PND/Orthopnea. Weight at home today was 179 pounds. Had a salad last night for dinner. Taking all medications. Lives alone. Able to drive to appointments.   ECHO 04/2018 Left ventricle:  15%. Diffuse hypokinesis. Doppler   parameters are consistent with restrictive physiology, indicative   of decreased left ventricular diastolic compliance and/or   increased left atrial pressure. Doppler parameters are consistent   with high ventricular filling pressure. - Left atrium: The atrium was severely dilated. - Right ventricle: The cavity size was mildly dilated. Systolic   function was moderately reduced. - Right atrium: The atrium was moderately dilated. - Tricuspid valve: There was moderate regurgitation. - Pulmonary arteries: Systolic pressure was mildly increased. PA   peak pressure: 44 mm Hg (S).  ECHO 12/2017  LVEF 30-35%  RA severely dilated  RV mildly dilated  Peak PA pressure 43 mm hg  LHC 04/2018   Mid LM lesion is 90% stenosed.  Prox LAD lesion is 100% stenosed.  Ost 2nd Mrg to 2nd Mrg lesion is 100% stenosed.  2nd Mrg lesion is 99% stenosed.  Prox Cx to Mid Cx lesion is 30% stenosed.  Ost RCA to Prox RCA lesion is 100% stenosed.  Mid Graft lesion is 30% stenosed.  Origin to Prox Graft lesion is 100% stenosed.  SVG.  Prox Graft lesion is 90% stenosed.  A drug-eluting stent was successfully  placed using a STENT SYNERGY DES 3X16.  Post intervention, there is a 10% residual stenosis.  1.  Severe stenosis of the native left mainstem, treated successfully with PCI using a 3.0 x 16 mm Synergy DES postdilated to high pressure with a 3.25 mm noncompliant balloon 2.  Chronic occlusion of the LAD supplied by a widely patent LIMA graft 3.  Severe stenosis of the saphenous vein graft to first OM with total occlusion of the OM branch just beyond the coronary anastomosis, unchanged from  the previous study 4.  Patent saphenous vein graft to first diagonal supplying collaterals to the obtuse marginal branch 5.  Known chronic occlusion of the native RCA and saphenous vein graft RCA, not selectively injected Recommend uninterrupted dual antiplatelet therapy with Aspirin 81mg  daily and Clopidogrel 75mg  daily for a minimum of 12 months (ACS - Class I recommendation).   RHC/LHC 12/2017   Ost RCA to Prox RCA lesion is 100% stenosed.  Mid LM lesion is 80% stenosed.  Prox LAD lesion is 100% stenosed.  Ost 2nd Mrg to 2nd Mrg lesion is 100% stenosed.  2nd Mrg lesion is 99% stenosed.  Origin to Prox Graft lesion is 100% stenosed.  Prox Cx to Mid Cx lesion is 30% stenosed.  Mid Graft lesion is 30% stenosed.  Findings: RA = 4 RV = 22/5 PA = 27/10 (16) PCW = 15 Fick cardiac output/index = 5.1/2.5 FA sat = 94% PA sat = 66%, 66%  Past Medical History:  Diagnosis Date  . AAA (abdominal aortic aneurysm) [facility]) July 2013  . Alcohol abuse   . Anxiety   . CAD (coronary artery disease)    a. s/p CABG 2010.  . Carotid artery occlusion    a. prior R CEA, stenting of L carotid 2014. b. Severe carotid restenosis in 2017 -> planned for surgery but pt did not f/u.  [name] Chronic combined systolic and diastolic CHF (congestive heart failure) (HCC)   . GERD (gastroesophageal reflux disease)   . Heart attack (HCC) 2010  . Hx of CABG 2010  . Hyperlipidemia   . Hypertension   . Lung nodule   . Polysubstance abuse (HCC)    a. mention of cocaine positivity in 2010 with patient stating unknowingly exposed, also EtOH.  [name] PVD (peripheral vascular disease) (HCC)    a. PAD s/p failed attempt at stenting of the left common iliac artery 2014.  . Stroke (HCC)   . Uncontrolled hypertension 02/02/2012    Current Outpatient Medications  Medication Sig Dispense Refill  . aspirin EC 81 MG tablet Take 1 tablet (81 mg total) by mouth daily. 30 tablet 2  . citalopram (CELEXA) 20 MG tablet Take 1  tablet (20 mg total) by mouth daily. 90 tablet 0  . clopidogrel (PLAVIX) 75 MG tablet Take 1 tablet (75 mg total) by mouth daily with breakfast. 30 tablet 6  . digoxin (LANOXIN) 0.125 MG tablet Take 1 tablet (0.125 mg total) by mouth daily. 30 tablet 6  . ezetimibe (ZETIA) 10 MG tablet Take 1 tablet (10 mg total) by mouth daily. 90 tablet 3  . furosemide (LASIX) 20 MG tablet Take 3 tablets (60 mg total) by mouth daily. 90 tablet 3  . hydrALAZINE (APRESOLINE) 100 MG tablet Take 1 tablet (100 mg total) by mouth every 8 (eight) hours. 90 tablet 6  . isosorbide mononitrate (IMDUR) 60 MG 24 hr tablet Take 1 tablet (60 mg total) by mouth daily. 30 tablet 6  . nitroGLYCERIN (NITROSTAT) 0.4 MG  SL tablet Place 1 tablet (0.4 mg total) under the tongue every 5 (five) minutes x 3 doses as needed for chest pain. 25 tablet 12  . omeprazole (PRILOSEC OTC) 20 MG tablet Take 20 mg by mouth daily.    [name] atorvastatin (LIPITOR) 80 MG tablet Take 1 tablet (80 mg total) by mouth every evening. 90 tablet 3  . lisinopril (PRINIVIL,ZESTRIL) 5 MG tablet Take 1 tablet (5 mg total) by mouth daily. 30 tablet 11  . potassium chloride SA (K-DUR,KLOR-CON) 20 MEQ tablet Take 2 tablets (40 mEq total) by mouth daily. 60 tablet 3  . spironolactone (ALDACTONE) 25 MG tablet Take 1 tablet (25 mg total) by mouth daily. 30 tablet 5   No current facility-administered medications for this encounter.     Allergies  Allergen Reactions  . Entresto [Sacubitril-Valsartan]     Dizziness, sick on stomach      Social History   Socioeconomic History  . Marital status: Widowed    Spouse name: Not on file  . Number of children: 1  . Years of education: 9th  . Highest education level: Not on file  Occupational History  . Occupation: not working.   Social Needs  . Financial resource strain: Not very hard  . Food insecurity:    Worry: Never true    Inability: Never true  . Transportation needs:    Medical: No    Non-medical: No    Tobacco Use  . Smoking status: Former Smoker    Packs/day: 0.50    Years: 30.00    Pack years: 15.00    Types: Cigarettes    Last attempt to quit: 05/04/2018    Years since quitting: 0.0  . Smokeless tobacco: Never Used  Substance and Sexual Activity  . Alcohol use: Not Currently    Alcohol/week: 5.0 standard drinks    Types: 5 Cans of beer per week    Comment: occasionally  . Drug use: Yes    Types: Marijuana  . Sexual activity: Yes  Lifestyle  . Physical activity:    Days per week: Not on file    Minutes per session: Not on file  . Stress: Not on file  Relationships  . Social connections:    Talks on phone: Not on file    Gets together: Not on file    Attends religious service: Not on file    Active member of club or organization: Not on file    Attends meetings of clubs or organizations: Not on file    Relationship status: Not on file  . Intimate partner violence:    Fear of current or ex partner: Not on file    Emotionally abused: Not on file    Physically abused: Not on file    Forced sexual activity: Not on file  Other Topics Concern  . Not on file  Social History Narrative   Pt is widowed and lives alone.   Patient is right handed   Patient drinks about 2cups of caffeine daily.         Family History  Problem Relation Age of Onset  . Asthma Mother   . Diabetes Mother   . Hyperlipidemia Mother   . Hypertension Mother   . Lung cancer Father   . Cancer Father   . Stroke Sister     Vitals:   06/02/18 0919  BP: (!) 144/86  Pulse: 99  SpO2: 97%  Weight: 85.2 kg (187 lb 14.4 oz)   Wt Readings from  Last 3 Encounters:  06/02/18 85.2 kg (187 lb 14.4 oz)  06/01/18 87.3 kg (192 lb 6.4 oz)  05/21/18 76.4 kg (168 lb 6.9 oz)     PHYSICAL EXAM: General:  Well appearing. No resp difficulty HEENT: normal Neck: supple. JVP ~10 . Carotids 2+ bilat; no bruits. No lymphadenopathy or thryomegaly appreciated. Cor: PMI nondisplaced. Regular rate & rhythm. No  rubs, gallops or murmurs. Lungs: clear Abdomen: soft, nontender, nondistended. No hepatosplenomegaly. No bruits or masses. Good bowel sounds. Extremities: no cyanosis, clubbing, rash, R and LLE 1-2+edema Neuro: alert & orientedx3, cranial nerves grossly intact. moves all 4 extremities w/o difficulty. Affect pleasant   ASSESSMENT & PLAN:  1. Chronic systolic HF Echo in 2017 showed EF 55% - Echo 03/2017: EF 30-35% with grade 2 DD. ECHO in November down to 15%.  NYHA II-III. Volume status improving. Give another 80 mg IV lasix now + 60 meq potassium. --> > 700 cc urine output.  - Check BMET and BNP now.  - Tomorrow he will start  lasix to 60 mg daily and add 40 meq potassium daily.  - Hold off on bb for now.  - Continue digoxin 0.125 mg daily  -Continue 25 mg spironolactone daily - Start 5 mg lisinopril daily. Intolerant entresto.  - Continue hydralazine 100 mg three times a day + imdur 60 mg daily.  - Repeat echo in 3 months after HF meds optimized.   2. CAD s/p CABG 2010 -LHC7/2/19 showed patent LIMA to LAD, SVG to RCA occluded, SVG to OM-2 with 95% prox lesion. Has unprotected AV-groove LCX which feeds collaterals to distal RCA.  - 11/20 S/P DES to left main.  - Plan for dual antiplatelet therapy with asa mg daily + plavix for minimum of 12 months.  - On ASA, plavix, statin -no bb for now with recent low output.  -He has been called by cardiac rehab but they could not contact him. We have asked him to follow up with cardiac rehab. He was given the phone number to call.    -  3. Hx of Tobacco use -Quit smoking in November   4. HTN -Improving.  - Add 5 mg lisinopril. Continue all other medication.   5. CKD - Baseline ~1.2-1.4 - Check BMET today.   6. ?OSA - Discussed possible sleep study referral for next visit  7. Alcohol abuse - Has not had alcohol since 05/11/2018.  - Encouraged to avoid alcohol  8. Carotid Stenosis Carotid US 04/14/16: Right ICA 80-99%, Left ICA  upper range <50% vs low range >50%. Carotid U/S completed 04/2018 . This suggested total occlusion R ICA and 50-75% LICA. He did not require surgical intervention but will continue statin and follow up with [facility] in 1 year.   9. Hypokalemia  K low. Adding K as noted above.   Check BMET and BNP today.   Follow up next week with pharmacy and 2 weeks with APP  Tonye Becket, NP 06/02/18

## 2018-06-01 NOTE — Progress Notes (Addendum)
Advanced Heart Failure Clinic Note   Referring Physician: Dr. Mosetta Putt PCP: Howard Pouch, MD HF Cardiologist: Dr [name] VVS: Dr Randie Heinz   HPI: Randy [name] is a 57 y.o. male with history of combined HF (diagnosed 03/2017), HTN, HLD, CAD s/p CABG 2010 and PCI (last ~2014), former tobacco use, CVA, and AAA. He is intolerant entresto.   Admitted 03/2017 with CHF exacerbation. Echo showed EF 30-35% with grade 2 DD. Cardiology was consulted and he refused [facility] for further work up.   Quit smoking Jan 1. Smoked for ~50 years prior 1 ppd. ETOH use (Bud Light) 18 pack/week, decreased from 18-24 beers daily. Has not smoked or had alcohol since 05/12/2018.   He was initially seen in the HF clinic June 2019. At that time he was set up for heart cath and switched to entresto. He was not on spiro with a history of hyperkalemia. He underwent LHC/RHC in July. He did not require intervention. Reduced EF was thought to be related to ETOH abuse.   Admitted to [name] 05/12/2018 with increased dyspnea and chest pain. Elevated troponin was thought to be in the setting of demand ischemia. He has an ECHO completed that showed severely reduced EF 15%. Diuresed with IV lasix but had poor response so milrinone was added. Diuresed over 20 pounds. Once adequately diuresed interventional cardiology took him to the cath lab and he underwent PCI DES to left main. After heart catherization, milrinone was gradually weaned off. He will continue on aspirin and plavix for 1 year.  He was not on bb due to low output. He was not on entresto due to intolerance and not on an ace due to elevated K and creatinine. PICC line was removed prior to discharge. Vascular consulted and repeated carotid U/S. This suggested total occlusion R ICA and 50-75% LICA. He did not require surgical intervention but will continue statin and follow up with VVS in 1 year. Discharge weight was 168 pounds.   Today he presents for post hospital follow up. Overall  feeling poorly. SOB with exertion. Denies PND. +Orthopnea and increased leg edema. He denies chest pain.  He has not been weighing because he doesn't have a scale.  Eating spaghetti o's every night. Drinking>2 liters daily and eating 3 tangerines everyday. Taking all medications but has only been taking 20 mg of lasix instead of 40 mg lasix. He lives alone. He has not had alcohol or cigarettes in 5 weeks.   ECHO 04/2018 Left ventricle:  15%. Diffuse hypokinesis. Doppler   parameters are consistent with restrictive physiology, indicative   of decreased left ventricular diastolic compliance and/or   increased left atrial pressure. Doppler parameters are consistent   with high ventricular filling pressure. - Left atrium: The atrium was severely dilated. - Right ventricle: The cavity size was mildly dilated. Systolic   function was moderately reduced. - Right atrium: The atrium was moderately dilated. - Tricuspid valve: There was moderate regurgitation. - Pulmonary arteries: Systolic pressure was mildly increased. PA   peak pressure: 44 mm Hg (S).  ECHO 12/2017  LVEF 30-35%  RA severely dilated  RV mildly dilated  Peak PA pressure 43 mm hg  LHC 04/2018   Mid LM lesion is 90% stenosed.  Prox LAD lesion is 100% stenosed.  Ost 2nd Mrg to 2nd Mrg lesion is 100% stenosed.  2nd Mrg lesion is 99% stenosed.  Prox Cx to Mid Cx lesion is 30% stenosed.  Ost RCA to Prox RCA lesion is 100% stenosed.  Mid  Graft lesion is 30% stenosed.  Origin to Prox Graft lesion is 100% stenosed.  SVG.  Prox Graft lesion is 90% stenosed.  A drug-eluting stent was successfully placed using a STENT SYNERGY DES 3X16.  Post intervention, there is a 10% residual stenosis.  1.  Severe stenosis of the native left mainstem, treated successfully with PCI using a 3.0 x 16 mm Synergy DES postdilated to high pressure with a 3.25 mm noncompliant balloon 2.  Chronic occlusion of the LAD supplied by a widely patent  LIMA graft 3.  Severe stenosis of the saphenous vein graft to first OM with total occlusion of the OM branch just beyond the coronary anastomosis, unchanged from the previous study 4.  Patent saphenous vein graft to first diagonal supplying collaterals to the obtuse marginal branch 5.  Known chronic occlusion of the native RCA and saphenous vein graft RCA, not selectively injected Recommend uninterrupted dual antiplatelet therapy with Aspirin 81mg  daily and Clopidogrel 75mg  daily for a minimum of 12 months (ACS - Class I recommendation).   RHC/LHC 12/2017   Ost RCA to Prox RCA lesion is 100% stenosed.  Mid LM lesion is 80% stenosed.  Prox LAD lesion is 100% stenosed.  Ost 2nd Mrg to 2nd Mrg lesion is 100% stenosed.  2nd Mrg lesion is 99% stenosed.  Origin to Prox Graft lesion is 100% stenosed.  Prox Cx to Mid Cx lesion is 30% stenosed.  Mid Graft lesion is 30% stenosed.  Findings: RA = 4 RV = 22/5 PA = 27/10 (16) PCW = 15 Fick cardiac output/index = 5.1/2.5 FA sat = 94% PA sat = 66%, 66%  Past Medical History:  Diagnosis Date  . AAA (abdominal aortic aneurysm) [facility]) July 2013  . Alcohol abuse   . Anxiety   . CAD (coronary artery disease)    a. s/p CABG 2010.  . Carotid artery occlusion    a. prior R CEA, stenting of L carotid 2014. b. Severe carotid restenosis in 2017 -> planned for surgery but pt did not f/u.  [name] Chronic combined systolic and diastolic CHF (congestive heart failure) (HCC)   . GERD (gastroesophageal reflux disease)   . Heart attack (HCC) 2010  . Hx of CABG 2010  . Hyperlipidemia   . Hypertension   . Lung nodule   . Polysubstance abuse (HCC)    a. mention of cocaine positivity in 2010 with patient stating unknowingly exposed, also EtOH.  [name] PVD (peripheral vascular disease) (HCC)    a. PAD s/p failed attempt at stenting of the left common iliac artery 2014.  . Stroke (HCC)   . Uncontrolled hypertension 02/02/2012    Current Outpatient Medications    Medication Sig Dispense Refill  . aspirin EC 81 MG tablet Take 1 tablet (81 mg total) by mouth daily. 30 tablet 2  . atorvastatin (LIPITOR) 80 MG tablet Take 1 tablet (80 mg total) by mouth every evening. 90 tablet 3  . citalopram (CELEXA) 20 MG tablet Take 1 tablet (20 mg total) by mouth daily. 90 tablet 0  . clopidogrel (PLAVIX) 75 MG tablet Take 1 tablet (75 mg total) by mouth daily with breakfast. 30 tablet 6  . digoxin (LANOXIN) 0.125 MG tablet Take 1 tablet (0.125 mg total) by mouth daily. 30 tablet 6  . ezetimibe (ZETIA) 10 MG tablet Take 1 tablet (10 mg total) by mouth daily. 90 tablet 3  . furosemide (LASIX) 20 MG tablet Take 20 mg by mouth daily.    . hydrALAZINE (APRESOLINE)  100 MG tablet Take 1 tablet (100 mg total) by mouth every 8 (eight) hours. 90 tablet 6  . isosorbide mononitrate (IMDUR) 60 MG 24 hr tablet Take 1 tablet (60 mg total) by mouth daily. 30 tablet 6  . omeprazole (PRILOSEC OTC) 20 MG tablet Take 20 mg by mouth daily.    [name] spironolactone (ALDACTONE) 25 MG tablet Take 1 tablet (25 mg total) by mouth daily. 30 tablet 5  . nitroGLYCERIN (NITROSTAT) 0.4 MG SL tablet Place 1 tablet (0.4 mg total) under the tongue every 5 (five) minutes x 3 doses as needed for chest pain. (Patient not taking: Reported on 06/01/2018) 25 tablet 12   No current facility-administered medications for this encounter.     Allergies  Allergen Reactions  . Entresto [Sacubitril-Valsartan]     Dizziness, sick on stomach      Social History   Socioeconomic History  . Marital status: Widowed    Spouse name: Not on file  . Number of children: 1  . Years of education: 9th  . Highest education level: Not on file  Occupational History  . Occupation: not working.   Social Needs  . Financial resource strain: Not on file  . Food insecurity:    Worry: Not on file    Inability: Not on file  . Transportation needs:    Medical: Not on file    Non-medical: Not on file  Tobacco Use  . Smoking  status: Former Smoker    Packs/day: 0.50    Years: 30.00    Pack years: 15.00    Types: Cigarettes    Last attempt to quit: 05/04/2018    Years since quitting: 0.0  . Smokeless tobacco: Never Used  Substance and Sexual Activity  . Alcohol use: Not Currently    Alcohol/week: 5.0 standard drinks    Types: 5 Cans of beer per week    Comment: occasionally  . Drug use: Yes    Types: Marijuana  . Sexual activity: Yes  Lifestyle  . Physical activity:    Days per week: Not on file    Minutes per session: Not on file  . Stress: Not on file  Relationships  . Social connections:    Talks on phone: Not on file    Gets together: Not on file    Attends religious service: Not on file    Active member of club or organization: Not on file    Attends meetings of clubs or organizations: Not on file    Relationship status: Not on file  . Intimate partner violence:    Fear of current or ex partner: Not on file    Emotionally abused: Not on file    Physically abused: Not on file    Forced sexual activity: Not on file  Other Topics Concern  . Not on file  Social History Narrative   Pt is widowed and lives alone.   Patient is right handed   Patient drinks about 2cups of caffeine daily.         Family History  Problem Relation Age of Onset  . Asthma Mother   . Diabetes Mother   . Hyperlipidemia Mother   . Hypertension Mother   . Lung cancer Father   . Cancer Father   . Stroke Sister     Vitals:   06/01/18 1502  BP: (!) 162/104  Pulse: 100  SpO2: 97%  Weight: 87.3 kg (192 lb 6.4 oz)  Height: 5' 10.5" (1.791 m)  PHYSICAL EXAM: General:  Well appearing. No resp difficulty HEENT: normal Neck: supple. JVP top jaw . Carotids 2+ bilat; no bruits. No lymphadenopathy or thryomegaly appreciated. Cor: PMI nondisplaced. Regular rate & rhythm. No rubs, gallops or murmurs. Lungs: clear Abdomen: soft, nontender, nondistended. No hepatosplenomegaly. No bruits or masses. Good bowel  sounds. Extremities: warm, no cyanosis, clubbing, rash, R and LLE 3+ edema Neuro: alert & orientedx3, cranial nerves grossly intact. moves all 4 extremities w/o difficulty. Affect pleasant  EKG: NSR 94 bpm   ASSESSMENT & PLAN:  1. Chronic systolic HF Echo in 2017 showed EF 55% - Echo 03/2017: EF 30-35% with grade 2 DD. ECHO in November down to 15%.  NYHA IIIb. Marked volume overload in the setting of high sodium diet and medication error. He was only taking 20 mg lasix versus 40 mg daily.  Today he was given 80 mg IV lasix in the clinic with > 750 cc urine output. He will also increase lasix to 60 mg daily  - Hold off on bb for now.  - Continue digoxin 0.125 mg daily  -Continue 25 mg spironolactone daily - Continue hydralazine 100 mg three times a day + imdur 60 mg daily.  - Repeat echo in 3 months after HF meds optimized.  -Reinforced low salt diet and limiting fluid intake to < 2 liters per day.    2. CAD s/p CABG 2010 -LHC7/2/19 showed patent LIMA to LAD, SVG to RCA occluded, SVG to OM-2 with 95% prox lesion. Has unprotected AV-groove LCX which feeds collaterals to distal RCA.  - 11/20 S/P DES to left main.  - Plan for dual antiplatelet therapy with asa mg daily + plavix for minimum of 12 months.  - On ASA, plavix, statin -no bb for now with recent low output.  - Will need to make sure he has cardiac rehab referral.  -  3. Hx of Tobacco use -Quit smoking in November   4. HTN - Uncontrolled. Diurese as above. Anticipate adding lisinopril tomorrow if renal function and potassium ok. .   5. CKD - Baseline ~1.2-1.4 - Check BMET today.   6. ?OSA - Discussed possible sleep study referral for next visit  7. Alcohol abuse - Has not had alcohol since 05/11/2018.  - Encouraged to avoid alcohol  8. Carotid Stenosis Carotid US 04/14/16: Right ICA 80-99%, Left ICA upper range <50% vs low range >50%. Carotid U/S completed 04/2018 . This suggested total occlusion R ICA and 50-75%  LICA. He did not require surgical intervention but will continue statin and follow up with VVS in 1 year.   Follow up tomorrow to reassess volume status. May need to given another 80 mg of IV lasix tomorrow. He is adamant he does not want hospital admit.   Today he was referred to HF Paramedicine. We provided a scale and discussed at length low salt diet and limiting fluid intake to < 2 liters per day.   Follow up with pharmacy next week, and 2 weeks with APP. [name]  Tonye Becket, NP 06/01/18

## 2018-06-01 NOTE — Progress Notes (Signed)
Patient with 24 gauge IV inserted to Left hand.  He was then administered 80 mg IV Lasix per Tonye Becket NP.  IV discontinued prior to patient discharge from AHF Clinic.

## 2018-06-01 NOTE — Patient Instructions (Signed)
INCREASE Lasix to 60 mg (3 tabs) daily  Labs today We will only contact you if something comes back abnormal or we need to make some changes. Otherwise no news is good news!  Your physician recommends that you schedule a follow-up appointment in: 24 hours with Amy Clegg,NP and 2 weeks  in the Advanced Practitioners (PA/NP) Clinic   Your physician recommends that you schedule a follow-up appointment in: 1 week in the Pharmacy Clinic  Do the following things EVERYDAY: 1) Weigh yourself in the morning before breakfast. Write it down and keep it in a log. 2) Take your medicines as prescribed 3) Eat low salt foods-Limit salt (sodium) to 2000 mg per day.  4) Stay as active as you can everyday 5) Limit all fluids for the day to less than 2 liters

## 2018-06-02 ENCOUNTER — Ambulatory Visit (HOSPITAL_COMMUNITY)
Admission: RE | Admit: 2018-06-02 | Discharge: 2018-06-02 | Disposition: A | Discharge status: Home / Self Care | Payer: Medicaid Other | Source: Ambulatory Visit | Attending: Internal Medicine | Admitting: Internal Medicine

## 2018-06-02 ENCOUNTER — Telehealth (HOSPITAL_COMMUNITY): Payer: Self-pay | Admitting: Surgery

## 2018-06-02 ENCOUNTER — Encounter (HOSPITAL_COMMUNITY): Payer: Self-pay

## 2018-06-02 ENCOUNTER — Other Ambulatory Visit (HOSPITAL_COMMUNITY): Payer: Self-pay

## 2018-06-02 VITALS — BP 144/86 | HR 99 | Wt 187.9 lb

## 2018-06-02 DIAGNOSIS — F419 Anxiety disorder, unspecified: Secondary | ICD-10-CM | POA: Insufficient documentation

## 2018-06-02 DIAGNOSIS — I5022 Chronic systolic (congestive) heart failure: Secondary | ICD-10-CM | POA: Diagnosis present

## 2018-06-02 DIAGNOSIS — E785 Hyperlipidemia, unspecified: Secondary | ICD-10-CM | POA: Insufficient documentation

## 2018-06-02 DIAGNOSIS — Z8249 Family history of ischemic heart disease and other diseases of the circulatory system: Secondary | ICD-10-CM | POA: Diagnosis not present

## 2018-06-02 DIAGNOSIS — I714 Abdominal aortic aneurysm, without rupture: Secondary | ICD-10-CM | POA: Diagnosis not present

## 2018-06-02 DIAGNOSIS — I5023 Acute on chronic systolic (congestive) heart failure: Secondary | ICD-10-CM | POA: Diagnosis not present

## 2018-06-02 DIAGNOSIS — I130 Hypertensive heart and chronic kidney disease with heart failure and stage 1 through stage 4 chronic kidney disease, or unspecified chronic kidney disease: Secondary | ICD-10-CM | POA: Diagnosis not present

## 2018-06-02 DIAGNOSIS — I2510 Atherosclerotic heart disease of native coronary artery without angina pectoris: Secondary | ICD-10-CM | POA: Diagnosis not present

## 2018-06-02 DIAGNOSIS — K219 Gastro-esophageal reflux disease without esophagitis: Secondary | ICD-10-CM | POA: Insufficient documentation

## 2018-06-02 DIAGNOSIS — E876 Hypokalemia: Secondary | ICD-10-CM | POA: Diagnosis not present

## 2018-06-02 DIAGNOSIS — F1010 Alcohol abuse, uncomplicated: Secondary | ICD-10-CM

## 2018-06-02 DIAGNOSIS — Z7902 Long term (current) use of antithrombotics/antiplatelets: Secondary | ICD-10-CM | POA: Insufficient documentation

## 2018-06-02 DIAGNOSIS — Z951 Presence of aortocoronary bypass graft: Secondary | ICD-10-CM | POA: Diagnosis not present

## 2018-06-02 DIAGNOSIS — N189 Chronic kidney disease, unspecified: Secondary | ICD-10-CM | POA: Diagnosis not present

## 2018-06-02 DIAGNOSIS — Z8673 Personal history of transient ischemic attack (TIA), and cerebral infarction without residual deficits: Secondary | ICD-10-CM | POA: Insufficient documentation

## 2018-06-02 DIAGNOSIS — Z7982 Long term (current) use of aspirin: Secondary | ICD-10-CM | POA: Diagnosis not present

## 2018-06-02 DIAGNOSIS — I252 Old myocardial infarction: Secondary | ICD-10-CM | POA: Diagnosis not present

## 2018-06-02 DIAGNOSIS — Z955 Presence of coronary angioplasty implant and graft: Secondary | ICD-10-CM | POA: Insufficient documentation

## 2018-06-02 DIAGNOSIS — I739 Peripheral vascular disease, unspecified: Secondary | ICD-10-CM | POA: Diagnosis not present

## 2018-06-02 DIAGNOSIS — Z720 Tobacco use: Secondary | ICD-10-CM | POA: Diagnosis not present

## 2018-06-02 DIAGNOSIS — Z79899 Other long term (current) drug therapy: Secondary | ICD-10-CM | POA: Diagnosis not present

## 2018-06-02 DIAGNOSIS — Z87891 Personal history of nicotine dependence: Secondary | ICD-10-CM | POA: Diagnosis not present

## 2018-06-02 LAB — BRAIN NATRIURETIC PEPTIDE: B Natriuretic Peptide: 3189 pg/mL — ABNORMAL HIGH (ref 0.0–100.0)

## 2018-06-02 LAB — BASIC METABOLIC PANEL
Anion gap: 15 (ref 5–15)
BUN: 18 mg/dL (ref 6–20)
CHLORIDE: 87 mmol/L — AB (ref 98–111)
CO2: 29 mmol/L (ref 22–32)
Calcium: 9 mg/dL (ref 8.9–10.3)
Creatinine, Ser: 1.35 mg/dL — ABNORMAL HIGH (ref 0.61–1.24)
GFR calc Af Amer: >60 mL/min (ref >60)
GFR calc non Af Amer: 57 mL/min — ABNORMAL LOW (ref >60)
GLUCOSE: 105 mg/dL — AB (ref 70–99)
POTASSIUM: 3 mmol/L — AB (ref 3.5–5.1)
Sodium: 131 mmol/L — ABNORMAL LOW (ref 135–145)

## 2018-06-02 MED ORDER — POTASSIUM CHLORIDE CRYS ER 20 MEQ PO TBCR: 40.0000 meq | EXTENDED_RELEASE_TABLET | Freq: Every day | ORAL | 3 refills | Status: DC

## 2018-06-02 MED ORDER — POTASSIUM CHLORIDE CRYS ER 20 MEQ PO TBCR
60.0000 meq | EXTENDED_RELEASE_TABLET | Freq: Once | ORAL | Status: AC
  Administered 2018-06-02: 60 meq via ORAL
  Filled 2018-06-02: qty 3

## 2018-06-02 MED ORDER — LISINOPRIL 5 MG PO TABS: 5.0000 mg | ORAL_TABLET | Freq: Every day | ORAL | 11 refills | Status: DC

## 2018-06-02 MED ORDER — FUROSEMIDE 10 MG/ML IJ SOLN
80.0000 mg | Freq: Once | INTRAMUSCULAR | Status: AC
  Administered 2018-06-02: 80 mg via INTRAVENOUS
  Filled 2018-06-02: qty 8

## 2018-06-02 NOTE — Progress Notes (Signed)
Paramedicine Initial Assessment:  Housing:  In what kind of housing do you live? House/apt/trailer/shelter? House that he rents from his old boss- has lived there for years.  Do you rent/pay a mortgage/own? Rent  Do you live with anyone?Alone except for 2 dogs.  Are you currently worried about losing your housing? No  Within the past 12 months have you ever stayed outside, in a car, tent, a shelter, or temporarily with someone? No  Within the past 12 months have you been unable to get utilities when it was really needed? No  Social:  What is your current marital status? Single  Do you have any children? 1 daughter who lives in [place] and has 3 children- has close relationship with her and reports seeing her everyday.  Food:  Within the past 12 months were you ever worried that food would run out before you got money to buy more? No  Within the past 78months have you run out of food and didn't have money to buy more?No  Income:  What is your current source of income?  Gets about $750 in SSDI and gets $200/month in food stamps  How hard is it for you to pay for the basics like food housing, medical care, and utilities? Not very hard  Do you have outstanding medical bills? no  Insurance:  Are you currently insured? Medicaid  Do you have prescription coverage? yes  Transportation:  Do you have transportation to your medical appointments? Owns his own truck  In the past 12 months has lack of transportation kept you from medical appts or from getting medications? No  In the past 12 months has lack of transportation kept you from meetings, work, or getting things you needed? No   Daily Health Needs: Do you have a working scale at home? Yes- was given one yesterday  How do you manage your medications at home? Pillbox  Do you ever take your medications differently than prescribed? No  Do you have issues affording your medications? No  Do you have any concerns with  mobility at home? No  Do you use any assistive devices at home or have PCS at home? Uses a walking stick when he needs to walk long distances.   Are there any additional barriers you see to getting the care you need? None identified at this time.  CSW will continue to follow through paramedicine program and assist as needed.   Burna Sis, LCSW Clinical Social Worker Advanced Heart Failure Clinic [phone]

## 2018-06-02 NOTE — Telephone Encounter (Signed)
Patient has been referred to the HF [facility].  I have sent all appropriate paperwork via secure email to the Paramedicine team.

## 2018-06-02 NOTE — Progress Notes (Signed)
I met Randy [name] in the HF clinic today for the first time. The goal of my visit was to establish a rapport with him and schedule a time for me to come out to his house. We agreed to meet on Friday but did not establish a time. I advised I would call him when I was more sure of my schedule. He was understanding and agreeable.

## 2018-06-02 NOTE — Patient Instructions (Addendum)
START Lisinopril 5 mg, one tab daily START Potassium 40 meq daily  Labs today We will only contact you if something comes back abnormal or we need to make some changes. Otherwise no news is good news!  Keep follow up appointment as scheduled   Do the following things EVERYDAY: 1) Weigh yourself in the morning before breakfast. Write it down and keep it in a log. 2) Take your medicines as prescribed 3) Eat low salt foods-Limit salt (sodium) to 2000 mg per day.  4) Stay as active as you can everyday 5) Limit all fluids for the day to less than 2 liters

## 2018-06-08 ENCOUNTER — Telehealth (HOSPITAL_COMMUNITY): Payer: Self-pay

## 2018-06-08 NOTE — Telephone Encounter (Signed)
I called Mr Rebel to schedule an appointment. He did not answer so I left a voicemail with all my information and requested he call me back.

## 2018-06-09 ENCOUNTER — Other Ambulatory Visit (HOSPITAL_COMMUNITY): Payer: Self-pay

## 2018-06-09 ENCOUNTER — Ambulatory Visit (HOSPITAL_COMMUNITY)
Admission: RE | Admit: 2018-06-09 | Discharge: 2018-06-09 | Disposition: A | Discharge status: Home / Self Care | Payer: Medicaid Other | Source: Ambulatory Visit | Attending: Internal Medicine | Admitting: Internal Medicine

## 2018-06-09 VITALS — BP 112/78 | HR 80 | Wt 203.2 lb

## 2018-06-09 DIAGNOSIS — I6529 Occlusion and stenosis of unspecified carotid artery: Secondary | ICD-10-CM | POA: Diagnosis not present

## 2018-06-09 DIAGNOSIS — N189 Chronic kidney disease, unspecified: Secondary | ICD-10-CM | POA: Diagnosis not present

## 2018-06-09 DIAGNOSIS — Z87891 Personal history of nicotine dependence: Secondary | ICD-10-CM | POA: Insufficient documentation

## 2018-06-09 DIAGNOSIS — E876 Hypokalemia: Secondary | ICD-10-CM | POA: Insufficient documentation

## 2018-06-09 DIAGNOSIS — Z951 Presence of aortocoronary bypass graft: Secondary | ICD-10-CM | POA: Insufficient documentation

## 2018-06-09 DIAGNOSIS — I5022 Chronic systolic (congestive) heart failure: Secondary | ICD-10-CM | POA: Insufficient documentation

## 2018-06-09 DIAGNOSIS — Z79899 Other long term (current) drug therapy: Secondary | ICD-10-CM | POA: Diagnosis not present

## 2018-06-09 DIAGNOSIS — I130 Hypertensive heart and chronic kidney disease with heart failure and stage 1 through stage 4 chronic kidney disease, or unspecified chronic kidney disease: Secondary | ICD-10-CM | POA: Insufficient documentation

## 2018-06-09 DIAGNOSIS — F1011 Alcohol abuse, in remission: Secondary | ICD-10-CM | POA: Insufficient documentation

## 2018-06-09 DIAGNOSIS — I2510 Atherosclerotic heart disease of native coronary artery without angina pectoris: Secondary | ICD-10-CM | POA: Diagnosis not present

## 2018-06-09 LAB — BASIC METABOLIC PANEL
ANION GAP: 9 (ref 5–15)
BUN: 17 mg/dL (ref 6–20)
CALCIUM: 8.4 mg/dL — AB (ref 8.9–10.3)
CO2: 30 mmol/L (ref 22–32)
Chloride: 96 mmol/L — ABNORMAL LOW (ref 98–111)
Creatinine, Ser: 1.43 mg/dL — ABNORMAL HIGH (ref 0.61–1.24)
GFR calc Af Amer: >60 mL/min (ref >60)
GFR calc non Af Amer: 53 mL/min — ABNORMAL LOW (ref >60)
Glucose, Bld: 93 mg/dL (ref 70–99)
Potassium: 3.8 mmol/L (ref 3.5–5.1)
SODIUM: 135 mmol/L (ref 135–145)

## 2018-06-09 LAB — BRAIN NATRIURETIC PEPTIDE: B Natriuretic Peptide: 1323 pg/mL — ABNORMAL HIGH (ref 0.0–100.0)

## 2018-06-09 MED ORDER — TORSEMIDE 20 MG PO TABS: 60.0000 mg | ORAL_TABLET | Freq: Every day | ORAL | 5 refills | Status: DC

## 2018-06-09 NOTE — Progress Notes (Signed)
Paramedicine Encounter   Patient ID: [name] , male,   DOB: 1961-03-20,57 y.o.,  MRN: [id number]   Met patient in clinic today with provider.    Weight at home-161-171 fluctuates Doesn't feel like he is urinating enough on lasix.  Was taking hydralazine BID due to couldn't keep up with the 3rd dose. Doroteo Bradford advised he could take it closer together to get the 3rd dose.  Uses [facility]. He denies any issues with copay.  He goes to pick it up. He states he cannot walk around grocery store unless he leans on buggy or uses scooter.  He lives home alone, has 2 dogs.  Has daughter and family close by in city.  Denies any issues with obtaining food.  PCP is family practice, Dr Burr Medico.  He hasnt seen her in awhile due to him coming here frequently.  He has SSD and medicaid.  His lasix is going to be changed to torsemide 41m.  He had BBQ sandwich and peach cobbler for lunch yesterday, eats sodium filled snacks. Will f/u with him on Monday for home visit.   Weight @ clinic-203  Last week-191 B/p-112/78 p-80 sp02-98% Reds clip-40%  KMarylouise Stacks EMT-Paramedic [phone]/04/2018   ACTION: Home visit completed

## 2018-06-09 NOTE — Patient Instructions (Signed)
Please STOP furosemide and START torsemide 3 tablets (60 mg) ONCE DAILY.   Please keep your appointment with our NP/PA next week on 06/16/18.

## 2018-06-09 NOTE — Progress Notes (Signed)
HF MD: Randy [name]  HPI: Randy [name] a 57 y.o.Caucasianmalewith history of combined HF (diagnosed 03/2017), HTN, HLD, CAD s/p CABG2010 and PCI (last ~2014), former tobacco use, CVA, and AAA.  Admitted 03/2017 with CHF exacerbation. Echo showed EF 30-35% with grade 2 DD. Cardiology was consulted and he refused [facility] for further work up.  Herecentlyestablishedcare with HF clinic. He deniedCP butgets SOB with exertion while outside, but it improves once he gets inside. Can walk about 50 feet before he gets SOB. SOB has been going on for several years.Doesn't feel like this has progressed.He snores, but has never had a sleep study. Chronic orthopnea. Denies PND. No wheezing.He has occasional BLE edema. Appetite and energy is great. Goes fishing frequently.No cough, fever, or chills. Limits salt intake. Takes medication as ordered.   Quit smoking Jan 1. Smoked for ~50 years prior 1 ppd. ETOH use(Bud Light)18 pack/week, decreased from 18-24 beers daily. Lives alone. Would not want another CABG.  Admitted to [facility] 05/12/2018 with increased dyspnea and chest pain. Elevated troponin was thought to be in the setting of demand ischemia. He has an ECHO completed that showed severely reduced EF 15%. Diuresed with IV lasix but had poor response so milrinone was added. Diuresed over 20 pounds. Once adequately diuresed interventional cardiology took him to the cath lab and he underwent PCI DES to left main. After heart catherization, milrinone was gradually weaned off. He will continue on aspirin and plavix for 1 year.  He was not on bb due to low output. He was not on entresto due to intolerance and not on an ace due to elevated K and creatinine. PICC line was removed prior to discharge. Vascular consulted and repeated carotid U/S. This suggested total occlusion R ICA and 50-75% LICA. He did not require surgical intervention but will continue statin and follow up with [facility] in 1 year. Discharge  weight was 168 pounds.   Today he presents to HF clinic for follow up pharmacist medication titration visit.At last HF clinic visit on 12/4, he was started on lisinopril 5 mg daily and KCl 40 mEq daily. He took his medications this morning and states that he has been very compliant over the past week. He did admit to missing one of his doses of hydralazine since he falls asleep before the last dose of the day. He has also quit drinking since last week. He has also cut down on the amount of fluid he drinks in a day. He is drinking V8 strawberry kiwi about 3 red solo cups per day (90 mg of Na in all 3 cups).    Shortness of breath/dyspnea on exertion?no  Orthopnea/PND?yes - has to sleep sitting up   Edema?Yes- 2+  Lightheadedness/dizziness?no  Daily weights at home?no  Blood pressure/heart rate monitoring at home?no  Following low-sodium/fluid-restricteddiet? No- no added salt but ate barbecue yesterday and snacks on saltines -- would like to eat tomato, banana or pineapple with mayo sandwiches  HF Medications: Digoxin 0.125 mg PO daily Furosemide 60 mg PO daily Hydralazine 100 mg PO TID Imdur 60 mg PO daily Lisinopril 5 mg PO daily KCl 40 mEq PO daily Spironolactone 25 mg PO daily  Has the patient been experiencing any side effects to the medications prescribed?Yes- Entresto as above  Does the patient have any problems obtaining medications due to transportation or finances?No- [place] Medicaid  Understanding of regimen:good Understanding of indications:good Potential of compliance:good Patient understands to avoid NSAIDs. Patient understands to avoid decongestants.   Pertinent Lab Values:  06/09/18:Serum creatinine1.43 (BL ~1.1-1.3),BUN 17, Potassium3.8, Sodium135, BNP 1323 (last visit 3189)  Vital Signs:  Weight:203 lb(dry weight: 181-184 lb)  Blood pressure:112/78 mmHg(136-158)  Heart rate:80 bpm(62-80)  ReDSCLIPelevated 40%  today, previously 42%  Assessment: 1. Chronicsystolic CHF (EF30-35%>>15%), due toICM. NYHA classIIsymptoms. - Volume status remains elevated based on weight gain and CLIP reading  - Switch furosemide to torsemide 60 mg daily - Continue hydralazine 100 mg TID, Imdur 60 mg daily, KCl 40 meq daily, lisinopril 5 mg daily, and  spironolactone 25 mg daily    - No BB for now with recent low output.   - Cannot tolerate Entresto 24-26 mg BID with excessive dizziness and nausea - Basic disease state pathophysiology, medication indication, mechanism and side effects reviewed at length with patient and he verbalized understanding  2. CAD s/p CABG 2010 -LHC7/2/19 showed patent LIMA to LAD, SVG to RCA occluded, SVG to OM-2 with 95% prox lesion. Has unprotected AV-groove LCX which feeds collaterals to distal RCA.  - 11/20 S/P DES to left main.  - Plan for dual antiplatelet therapy with asa mg daily + plavix for minimum of 12 months.  - On ASA, plavix, statin -He has been calledbycardiac rehabbut they could not contact him. We have asked him to follow up with cardiac rehab.He was given the phone number to call.  3. Hx of Tobacco use -Quit smoking in November   4. HTN - At goal today - Meds as above  5. CKD - Baseline ~1.2-1.4 - Check BMET today  6. ?OSA  - Discussed possible sleep study referral for next visit  7. Alcohol abuse - Has not had alcohol since 05/11/2018.  - Encouraged to avoid alcohol  8. Carotid Stenosis Carotid US 04/14/16: Right ICA 80-99%, Left ICA upper range <50% vs low range >50%. Carotid U/S completed 04/2018 . This suggested total occlusion R ICA and 50-75% LICA. He did not require surgical intervention but will continue statin and follow up with [facility] in 1 year.  9. Hypokalemia   - BMET today  Plan: 1) Medication changes: Based on clinical presentation, vital signs and recent labs willswitch furosemide to torsemide 60 mg (3 tablets) daily   2) Labs:BMET todayand at next visit 3) Follow-up:NP/PA on 06/16/18  [place] K. [name], PharmD, BCPS, CPP Clinical Pharmacist Phone: [phone] 06/09/2018 10:10 AM

## 2018-06-10 ENCOUNTER — Telehealth (HOSPITAL_COMMUNITY): Payer: Self-pay | Admitting: Pharmacist

## 2018-06-10 NOTE — Telephone Encounter (Signed)
Mr. Lamison called stating that [facility] never filled his torsemide yesterday. Have called them and verified that he will not take furosemide together with the torsemide. Torsemide is to replace furosemide. Pharmacist assured me he will fill it today. Relayed info to Mr. Rogelia Boga who will call me this afternoon if he doesn't hear back from them by then.   [name]. [name], PharmD, BCPS, CPP Clinical Pharmacist Phone: [phone] 06/10/2018 11:54 AM

## 2018-06-11 ENCOUNTER — Telehealth (HOSPITAL_COMMUNITY): Payer: Self-pay

## 2018-06-11 NOTE — Telephone Encounter (Signed)
Pt called to report that pharmacy has not filled his torsemide. Cicero Duck spoke with pharmacist on 12/12 and was informed that Rx would be filled that day. I spoke with pharmacy and he stated that it is in the process of being filled (12/13) and stated it would be ready in 2 hours.

## 2018-06-14 ENCOUNTER — Other Ambulatory Visit: Payer: Self-pay | Admitting: Student in an Organized Health Care Education/Training Program

## 2018-06-14 ENCOUNTER — Other Ambulatory Visit (HOSPITAL_COMMUNITY): Payer: Self-pay | Admitting: Pharmacist

## 2018-06-14 ENCOUNTER — Other Ambulatory Visit (HOSPITAL_COMMUNITY): Payer: Self-pay

## 2018-06-14 DIAGNOSIS — I5020 Unspecified systolic (congestive) heart failure: Secondary | ICD-10-CM

## 2018-06-14 MED ORDER — SPIRONOLACTONE 25 MG PO TABS: 25.0000 mg | ORAL_TABLET | Freq: Every day | ORAL | 5 refills | Status: DC

## 2018-06-14 MED ORDER — CITALOPRAM HYDROBROMIDE 20 MG PO TABS: 20.0000 mg | ORAL_TABLET | Freq: Every day | ORAL | 0 refills | Status: DC

## 2018-06-14 MED ORDER — DIGOXIN 125 MCG PO TABS: 0.1250 mg | ORAL_TABLET | Freq: Every day | ORAL | 5 refills | Status: DC

## 2018-06-14 MED ORDER — EZETIMIBE 10 MG PO TABS: 10.0000 mg | ORAL_TABLET | Freq: Every day | ORAL | 5 refills | Status: DC

## 2018-06-14 MED ORDER — CLOPIDOGREL BISULFATE 75 MG PO TABS: 75.0000 mg | ORAL_TABLET | Freq: Every day | ORAL | 5 refills | Status: DC

## 2018-06-14 MED ORDER — ISOSORBIDE MONONITRATE ER 60 MG PO TB24: 60.0000 mg | ORAL_TABLET | Freq: Every day | ORAL | 5 refills | Status: DC

## 2018-06-14 MED ORDER — HYDRALAZINE HCL 100 MG PO TABS: 100.0000 mg | ORAL_TABLET | Freq: Three times a day (TID) | ORAL | 5 refills | Status: DC

## 2018-06-14 NOTE — Progress Notes (Signed)
Paramedicine Encounter    Patient ID: Randy [name], male    DOB: 1961/01/04, 57 y.o.   MRN: [id number]   Patient Care Team: Everrett Coombe, MD as PCP - General Consuelo Pandy, PA-C as Physician Assistant (Cardiology) Rosalin Hawking, MD as Consulting Physician (Neurology) [name], LCSW as Social Worker (Licensed Clinical Social Worker)  Patient Active Problem List   Diagnosis Date Noted  . Acute on chronic congestive heart failure ([place])   . NSTEMI (non-ST elevated myocardial infarction) ([place]) 05/12/2018  . Acute on chronic systolic (congestive) heart failure ([place])   . Carotid stenosis, right   . Acute kidney injury superimposed on CKD ([place])   . History of tobacco abuse 10/23/2017  . Hyperkalemia 09/18/2017  . Systolic heart failure ([place]) 07/08/2017  . Acute congestive heart failure ([place])   . Dyspnea 03/23/2017  . Cellulitis of right ankle 01/23/2017  . Muscle spasm 10/23/2016  . Insomnia 04/11/2016  . Orthopnea 03/31/2016  . 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 HTN (hypertension) 06/15/2014  . HLD (hyperlipidemia) 04/05/2014  . PVD (peripheral vascular disease) with claudication ([place]) 05/09/2013  . GERD (gastroesophageal reflux disease) 03/28/2013  . AAA (abdominal aortic aneurysm) without rupture ([place]) 10/28/2012  . Anxiety state 02/12/2012  . Occlusion and stenosis of carotid artery without mention of cerebral infarction 01/19/2012  . Pulmonary nodule 05/19/2011  . CAD, ARTERY BYPASS GRAFT 10/19/2008  . DISC DISEASE, LUMBAR 10/19/2008    Current Outpatient Medications:  .  aspirin EC 81 MG tablet, Take 1 tablet (81 mg total) by mouth daily., Disp: 30 tablet, Rfl: 2 .  atorvastatin (LIPITOR) 80 MG tablet, Take 1 tablet (80 mg total) by mouth every evening., Disp: 90 tablet, Rfl: 3 .  citalopram (CELEXA) 20 MG tablet, Take 1 tablet (20 mg total) by mouth daily., Disp: 90 tablet, Rfl: 0 .  lisinopril (PRINIVIL,ZESTRIL) 5 MG  tablet, Take 1 tablet (5 mg total) by mouth daily., Disp: 30 tablet, Rfl: 11 .  potassium chloride SA (K-DUR,KLOR-CON) 20 MEQ tablet, Take 2 tablets (40 mEq total) by mouth daily., Disp: 60 tablet, Rfl: 3 .  torsemide (DEMADEX) 20 MG tablet, Take 3 tablets (60 mg total) by mouth daily., Disp: 90 tablet, Rfl: 5 .  clopidogrel (PLAVIX) 75 MG tablet, Take 1 tablet (75 mg total) by mouth daily with breakfast., Disp: 30 tablet, Rfl: 5 .  digoxin (LANOXIN) 0.125 MG tablet, Take 1 tablet (0.125 mg total) by mouth daily., Disp: 30 tablet, Rfl: 5 .  ezetimibe (ZETIA) 10 MG tablet, Take 1 tablet (10 mg total) by mouth daily., Disp: 30 tablet, Rfl: 5 .  hydrALAZINE (APRESOLINE) 100 MG tablet, Take 1 tablet (100 mg total) by mouth every 8 (eight) hours., Disp: 90 tablet, Rfl: 5 .  isosorbide mononitrate (IMDUR) 60 MG 24 hr tablet, Take 1 tablet (60 mg total) by mouth daily., Disp: 30 tablet, Rfl: 5 .  nitroGLYCERIN (NITROSTAT) 0.4 MG SL tablet, Place 1 tablet (0.4 mg total) under the tongue every 5 (five) minutes x 3 doses as needed for chest pain. (Patient not taking: Reported on 06/09/2018), Disp: 25 tablet, Rfl: 12 .  spironolactone (ALDACTONE) 25 MG tablet, Take 1 tablet (25 mg total) by mouth daily., Disp: 30 tablet, Rfl: 5 Allergies  Allergen Reactions  . Entresto [Sacubitril-Valsartan]     Dizziness, sick on stomach      Social History   Socioeconomic History  . Marital status: Widowed    Spouse name: Not  on file  . Number of children: 1  . Years of education: 9th  . Highest education level: Not on file  Occupational History  . Occupation: not working.   Social Needs  . Financial resource strain: Not very hard  . Food insecurity:    Worry: Never true    Inability: Never true  . Transportation needs:    Medical: No    Non-medical: No  Tobacco Use  . Smoking status: Former Smoker    Packs/day: 0.50    Years: 30.00    Pack years: 15.00    Types: Cigarettes    Last attempt to quit:  05/04/2018    Years since quitting: 0.1  . Smokeless tobacco: Never Used  Substance and Sexual Activity  . Alcohol use: Not Currently    Alcohol/week: 5.0 standard drinks    Types: 5 Cans of beer per week    Comment: occasionally  . Drug use: Yes    Types: Marijuana  . Sexual activity: Yes  Lifestyle  . Physical activity:    Days per week: Not on file    Minutes per session: Not on file  . Stress: Not on file  Relationships  . Social connections:    Talks on phone: Not on file    Gets together: Not on file    Attends religious service: Not on file    Active member of club or organization: Not on file    Attends meetings of clubs or organizations: Not on file    Relationship status: Not on file  . Intimate partner violence:    Fear of current or ex partner: Not on file    Emotionally abused: Not on file    Physically abused: Not on file    Forced sexual activity: Not on file  Other Topics Concern  . Not on file  Social History Narrative   Pt is widowed and lives alone.   Patient is right handed   Patient drinks about 2cups of caffeine daily.       Physical Exam      Future Appointments  Date Time Provider [place]  06/16/2018 11:30 AM [facility] PA/NP [facility] None    BP 122/78   Pulse 74   Resp 15   Wt 191 lb (86.6 kg)   SpO2 98%   BMI 27.02 kg/m   Weight yesterday-191 Last visit weight-203 @ clinic  REDS CLIP-unable to get one--read low quality twice   First meeting with pt in home, I met him last week during clinic visit. He just picked up his torsemide on Saturday, couldn't get it Friday due to the rain all day.   Pt is going to be moving his meds to [facility] [address] due to numerous past issues with getting his meds from [place] and have ran out before.  After med review--he has been taking his hydralazine at least twice a day. Pt reports he will be taking it close to every 8 hrs as possible to get it in three times a day.   Pt reports he has been  sleeping better since the extra fluid has come off. He is seeing better results with urination since switching to torsemide.   Trying to keep fluid intake less than 2L a day and sodium intake less than 2013m, we went through his kitchen and looked at his foods and snacked and we read labels and discussed the sodium levels and he will work on his intake.  Spoke with eBangladeshand she  will [facility] and also sent message to his PCP regarding refill for his citalopram.  V/s as noted, pt denies any c/p, no sob, no dizziness noted.   Marylouise Stacks, [facility] Paramedic  06/14/18

## 2018-06-15 NOTE — Progress Notes (Signed)
Advanced Heart Failure Clinic Note   Referring Physician: Dr. Mosetta Putt PCP: Howard Pouch, MD HF Cardiologist: Dr [name] VVS: Dr Randie Heinz   HPI: Randy [name] is a 57 y.o. male with history of combined HF (diagnosed 03/2017), HTN, HLD, CAD s/p CABG 2010 and PCI (last ~2014), former tobacco use, CVA, and AAA. He is intolerant entresto.   Admitted 03/2017 with CHF exacerbation. Echo showed EF 30-35% with grade 2 DD. Cardiology was consulted and he refused [facility] for further work up.   Quit smoking Jan 1. Smoked for ~50 years prior 1 ppd. ETOH use (Bud Light) 18 pack/week, decreased from 18-24 beers daily. Has not smoked or had alcohol since 05/12/2018.   He was initially seen in the HF clinic June 2019. At that time he was set up for heart cath and switched to entresto. He was not on spiro with a history of hyperkalemia. He underwent LHC/RHC in July. He did not require intervention. Reduced EF was thought to be related to ETOH abuse.   Admitted to [facility] 05/12/2018 with increased dyspnea and chest pain. Elevated troponin was thought to be in the setting of demand ischemia. He has an ECHO completed that showed severely reduced EF 15%. Diuresed with IV lasix but had poor response so milrinone was added. Diuresed over 20 pounds. Once adequately diuresed interventional cardiology took him to the cath lab and he underwent PCI DES to left main. After heart catherization, milrinone was gradually weaned off. He will continue on aspirin and plavix for 1 year.  He was not on bb due to low output. He was not on entresto due to intolerance and not on an ace due to elevated K and creatinine. PICC line was removed prior to discharge. Vascular consulted and repeated carotid U/S. This suggested total occlusion R ICA and 50-75% LICA. He did not require surgical intervention but will continue statin and follow up with VVS in 1 year. Discharge weight was 168 pounds.   Today he returns for HF follow up. Last visit 2 weeks ago he  was given IV lasix in clinic (2 days in a row) and started on lisinopril. He had a pharmacy visit last week and was switched from lasix to torsemide 60 mg daily. Overall doing well. He is urinating really well with torsemide. Had an episode of dizziness and groggyness when driving over today, but none otherwise. Gets SOB after walking up hill to mailbox. No edema, orthopnea, PND. Now sleeping in the bed instead of in recliner. Loves to eat. Katie with HF paramedicine is helping him with identifying high salt foods. Eating a lot of steak and salads. Seasons steak with light worcheshire sauce and limits this now. Weights: 180-191 lbs at home, 191 lbs this morning. No missed medications. Lives alone with his dogs. Drives to appointments. Drank 2 beers last night, but otherwise has not been drinking much. No tobacco use. Weight is down ?17 lbs from clinic visit last week.   ECHO 04/2018 Left ventricle:  15%. Diffuse hypokinesis. Doppler   parameters are consistent with restrictive physiology, indicative   of decreased left ventricular diastolic compliance and/or   increased left atrial pressure. Doppler parameters are consistent   with high ventricular filling pressure. - Left atrium: The atrium was severely dilated. - Right ventricle: The cavity size was mildly dilated. Systolic   function was moderately reduced. - Right atrium: The atrium was moderately dilated. - Tricuspid valve: There was moderate regurgitation. - Pulmonary arteries: Systolic pressure was mildly increased. PA  peak pressure: 44 mm Hg (S).  ECHO 12/2017  LVEF 30-35%  RA severely dilated  RV mildly dilated  Peak PA pressure 43 mm hg  LHC 04/2018   Mid LM lesion is 90% stenosed.  Prox LAD lesion is 100% stenosed.  Ost 2nd Mrg to 2nd Mrg lesion is 100% stenosed.  2nd Mrg lesion is 99% stenosed.  Prox Cx to Mid Cx lesion is 30% stenosed.  Ost RCA to Prox RCA lesion is 100% stenosed.  Mid Graft lesion is 30%  stenosed.  Origin to Prox Graft lesion is 100% stenosed.  SVG.  Prox Graft lesion is 90% stenosed.  A drug-eluting stent was successfully placed using a STENT SYNERGY DES 3X16.  Post intervention, there is a 10% residual stenosis.  1.  Severe stenosis of the native left mainstem, treated successfully with PCI using a 3.0 x 16 mm Synergy DES postdilated to high pressure with a 3.25 mm noncompliant balloon 2.  Chronic occlusion of the LAD supplied by a widely patent LIMA graft 3.  Severe stenosis of the saphenous vein graft to first OM with total occlusion of the OM branch just beyond the coronary anastomosis, unchanged from the previous study 4.  Patent saphenous vein graft to first diagonal supplying collaterals to the obtuse marginal branch 5.  Known chronic occlusion of the native RCA and saphenous vein graft RCA, not selectively injected Recommend uninterrupted dual antiplatelet therapy with Aspirin 81mg  daily and Clopidogrel 75mg  daily for a minimum of 12 months (ACS - Class I recommendation).   RHC/LHC 12/2017   Ost RCA to Prox RCA lesion is 100% stenosed.  Mid LM lesion is 80% stenosed.  Prox LAD lesion is 100% stenosed.  Ost 2nd Mrg to 2nd Mrg lesion is 100% stenosed.  2nd Mrg lesion is 99% stenosed.  Origin to Prox Graft lesion is 100% stenosed.  Prox Cx to Mid Cx lesion is 30% stenosed.  Mid Graft lesion is 30% stenosed.  Findings: RA = 4 RV = 22/5 PA = 27/10 (16) PCW = 15 Fick cardiac output/index = 5.1/2.5 FA sat = 94% PA sat = 66%, 66%  Past Medical History:  Diagnosis Date  . AAA (abdominal aortic aneurysm) [facility]) July 2013  . Alcohol abuse   . Anxiety   . CAD (coronary artery disease)    a. s/p CABG 2010.  . Carotid artery occlusion    a. prior R CEA, stenting of L carotid 2014. b. Severe carotid restenosis in 2017 -> planned for surgery but pt did not f/u.  [name] Chronic combined systolic and diastolic CHF (congestive heart failure) (HCC)   . GERD  (gastroesophageal reflux disease)   . Heart attack (HCC) 2010  . Hx of CABG 2010  . Hyperlipidemia   . Hypertension   . Lung nodule   . Polysubstance abuse (HCC)    a. mention of cocaine positivity in 2010 with patient stating unknowingly exposed, also EtOH.  [name] PVD (peripheral vascular disease) (HCC)    a. PAD s/p failed attempt at stenting of the left common iliac artery 2014.  . Stroke (HCC)   . Uncontrolled hypertension 02/02/2012    Current Outpatient Medications  Medication Sig Dispense Refill  . aspirin EC 81 MG tablet Take 1 tablet (81 mg total) by mouth daily. 30 tablet 2  . atorvastatin (LIPITOR) 80 MG tablet Take 1 tablet (80 mg total) by mouth every evening. 90 tablet 3  . citalopram (CELEXA) 20 MG tablet Take 1 tablet (20 mg total)  by mouth daily. 90 tablet 0  . clopidogrel (PLAVIX) 75 MG tablet Take 1 tablet (75 mg total) by mouth daily with breakfast. 30 tablet 5  . digoxin (LANOXIN) 0.125 MG tablet Take 1 tablet (0.125 mg total) by mouth daily. 30 tablet 5  . ezetimibe (ZETIA) 10 MG tablet Take 1 tablet (10 mg total) by mouth daily. 30 tablet 5  . hydrALAZINE (APRESOLINE) 100 MG tablet Take 1 tablet (100 mg total) by mouth every 8 (eight) hours. 90 tablet 5  . isosorbide mononitrate (IMDUR) 60 MG 24 hr tablet Take 1 tablet (60 mg total) by mouth daily. 30 tablet 5  . lisinopril (PRINIVIL,ZESTRIL) 5 MG tablet Take 1 tablet (5 mg total) by mouth daily. 30 tablet 11  . potassium chloride SA (K-DUR,KLOR-CON) 20 MEQ tablet Take 2 tablets (40 mEq total) by mouth daily. 60 tablet 3  . spironolactone (ALDACTONE) 25 MG tablet Take 1 tablet (25 mg total) by mouth daily. 30 tablet 5  . torsemide (DEMADEX) 20 MG tablet Take 3 tablets (60 mg total) by mouth daily. 90 tablet 5  . nitroGLYCERIN (NITROSTAT) 0.4 MG SL tablet Place 1 tablet (0.4 mg total) under the tongue every 5 (five) minutes x 3 doses as needed for chest pain. (Patient not taking: Reported on 06/09/2018) 25 tablet 12   No  current facility-administered medications for this encounter.     Allergies  Allergen Reactions  . Entresto [Sacubitril-Valsartan]     Dizziness, sick on stomach      Social History   Socioeconomic History  . Marital status: Widowed    Spouse name: Not on file  . Number of children: 1  . Years of education: 9th  . Highest education level: Not on file  Occupational History  . Occupation: not working.   Social Needs  . Financial resource strain: Not very hard  . Food insecurity:    Worry: Never true    Inability: Never true  . Transportation needs:    Medical: No    Non-medical: No  Tobacco Use  . Smoking status: Former Smoker    Packs/day: 0.50    Years: 30.00    Pack years: 15.00    Types: Cigarettes    Last attempt to quit: 05/04/2018    Years since quitting: 0.1  . Smokeless tobacco: Never Used  Substance and Sexual Activity  . Alcohol use: Not Currently    Alcohol/week: 5.0 standard drinks    Types: 5 Cans of beer per week    Comment: occasionally  . Drug use: Yes    Types: Marijuana  . Sexual activity: Yes  Lifestyle  . Physical activity:    Days per week: Not on file    Minutes per session: Not on file  . Stress: Not on file  Relationships  . Social connections:    Talks on phone: Not on file    Gets together: Not on file    Attends religious service: Not on file    Active member of club or organization: Not on file    Attends meetings of clubs or organizations: Not on file    Relationship status: Not on file  . Intimate partner violence:    Fear of current or ex partner: Not on file    Emotionally abused: Not on file    Physically abused: Not on file    Forced sexual activity: Not on file  Other Topics Concern  . Not on file  Social History Narrative   Pt  is widowed and lives alone.   Patient is right handed   Patient drinks about 2cups of caffeine daily.         Family History  Problem Relation Age of Onset  . Asthma Mother   .  Diabetes Mother   . Hyperlipidemia Mother   . Hypertension Mother   . Lung cancer Father   . Cancer Father   . Stroke Sister     Vitals:   06/16/18 1141  BP: 100/62  Pulse: 83  SpO2: 95%  Weight: 84.4 kg (186 lb)   Wt Readings from Last 3 Encounters:  06/16/18 84.4 kg (186 lb)  06/14/18 86.6 kg (191 lb)  06/09/18 92.2 kg (203 lb 3.2 oz)     PHYSICAL EXAM: General: Well appearing. No resp difficulty. HEENT: Normal Neck: Supple. JVP ~8. Carotids 2+ bilat; no bruits. No thyromegaly or nodule noted. Cor: PMI nondisplaced. RRR, No M/G/R noted Lungs: CTAB, normal effort. Abdomen: Soft, non-tender, non-distended, no HSM. No bruits or masses. +BS  Extremities: No cyanosis, clubbing, or rash. R and LLE 2+ edema to knees  Neuro: Alert & orientedx3, cranial nerves grossly intact. moves all 4 extremities w/o difficulty. Affect pleasant  ReDS clip: 39% (40% last week)  ASSESSMENT & PLAN:  1. Chronic systolic HF Echo in 2017 showed EF 55% - Echo 03/2017: EF 30-35% with grade 2 DD. ECHO in November down to 15%.  NYHA II-III. Volume status elevated on exam. ReDS clip 40 > 39% today. Weight trending down and he reports great UOP now that he is on torsemide. - Increase torsemide to 80 mg daily + 40 meq K daily. BMET today. Anticipate having to increase K today as well. He would like to avoid BID dosing if possible.  - Hold off on bb for now.  - Continue digoxin 0.125 mg daily. Check dig level today - Continue 25 mg spironolactone daily - Continue 5 mg lisinopril daily. No BP room to increase. - Continue hydralazine 100 mg three times a day + imdur 60 mg daily.  - Repeat echo in 3 months after HF meds optimized. Schedule today.  - Continue HF paramedicine.  - Provided prescription for TED hose - Discussed cardiac rehab. He is not interested. - Discussed low salt food options. Discussed importance of limiting fluid intake.   2. CAD s/p CABG 2010 -LHC7/2/19 showed patent LIMA to  LAD, SVG to RCA occluded, SVG to OM-2 with 95% prox lesion. Has unprotected AV-groove LCX which feeds collaterals to distal RCA.  - 11/20 S/P DES to left main.  - Plan for dual antiplatelet therapy with asa mg daily + plavix for minimum of 12 months.  - On ASA, plavix, statin - no bb for now with recent low output.  - Refuses cardiac rehab. No s/s ischemia.  3. Hx of Tobacco use - Quit smoking in November. No change.  4. HTN - Well controlled with HF meds.  5. CKD - Baseline ~1.2-1.4 - BMET today with change to torsemide.  6. ?OSA - Refer for sleep study today  7. Alcohol abuse - Has had 6 beers in the last 2 months. Encouraged him to limit alcohol intake as much as possible. He was previously drinking 18 beers/day  8. Carotid Stenosis Carotid US 04/14/16: Right ICA 80-99%, Left ICA upper range <50% vs low range >50%. Carotid U/S completed 04/2018 . This suggested total occlusion R ICA and 50-75% LICA. He did not require surgical intervention but will continue statin and follow up  with [facility] in 1 year. No change.   9. Hypokalemia  - BMET today. K was 3.8 last week.   Refer for sleep study. Increase torsemide as above. Labs today and in 2 weeks at follow up. Continue paramedicine support. Will let Florentina Addison know about med changes.   Alford Highland, NP 06/16/18   Greater than 50% of the 25 minute visit was spent in counseling/coordination of care regarding disease state education, salt/fluid restriction, sliding scale diuretics, and medication compliance.

## 2018-06-16 ENCOUNTER — Other Ambulatory Visit: Payer: Self-pay

## 2018-06-16 ENCOUNTER — Ambulatory Visit (HOSPITAL_COMMUNITY)
Admission: RE | Admit: 2018-06-16 | Discharge: 2018-06-16 | Disposition: A | Discharge status: Home / Self Care | Payer: Medicaid Other | Source: Ambulatory Visit | Attending: Cardiology | Admitting: Cardiology

## 2018-06-16 VITALS — BP 100/62 | HR 83 | Wt 186.0 lb

## 2018-06-16 DIAGNOSIS — N189 Chronic kidney disease, unspecified: Secondary | ICD-10-CM | POA: Insufficient documentation

## 2018-06-16 DIAGNOSIS — K219 Gastro-esophageal reflux disease without esophagitis: Secondary | ICD-10-CM | POA: Diagnosis not present

## 2018-06-16 DIAGNOSIS — Z951 Presence of aortocoronary bypass graft: Secondary | ICD-10-CM | POA: Diagnosis not present

## 2018-06-16 DIAGNOSIS — R0683 Snoring: Secondary | ICD-10-CM | POA: Diagnosis not present

## 2018-06-16 DIAGNOSIS — I10 Essential (primary) hypertension: Secondary | ICD-10-CM

## 2018-06-16 DIAGNOSIS — I5042 Chronic combined systolic (congestive) and diastolic (congestive) heart failure: Secondary | ICD-10-CM | POA: Diagnosis not present

## 2018-06-16 DIAGNOSIS — N183 Chronic kidney disease, stage 3 unspecified: Secondary | ICD-10-CM

## 2018-06-16 DIAGNOSIS — E876 Hypokalemia: Secondary | ICD-10-CM | POA: Insufficient documentation

## 2018-06-16 DIAGNOSIS — I214 Non-ST elevation (NSTEMI) myocardial infarction: Secondary | ICD-10-CM

## 2018-06-16 DIAGNOSIS — Z79899 Other long term (current) drug therapy: Secondary | ICD-10-CM | POA: Insufficient documentation

## 2018-06-16 DIAGNOSIS — E785 Hyperlipidemia, unspecified: Secondary | ICD-10-CM | POA: Insufficient documentation

## 2018-06-16 DIAGNOSIS — F1010 Alcohol abuse, uncomplicated: Secondary | ICD-10-CM

## 2018-06-16 DIAGNOSIS — Z87891 Personal history of nicotine dependence: Secondary | ICD-10-CM | POA: Insufficient documentation

## 2018-06-16 DIAGNOSIS — Z8673 Personal history of transient ischemic attack (TIA), and cerebral infarction without residual deficits: Secondary | ICD-10-CM | POA: Insufficient documentation

## 2018-06-16 DIAGNOSIS — I6523 Occlusion and stenosis of bilateral carotid arteries: Secondary | ICD-10-CM | POA: Diagnosis not present

## 2018-06-16 DIAGNOSIS — I252 Old myocardial infarction: Secondary | ICD-10-CM | POA: Insufficient documentation

## 2018-06-16 DIAGNOSIS — I714 Abdominal aortic aneurysm, without rupture: Secondary | ICD-10-CM | POA: Insufficient documentation

## 2018-06-16 DIAGNOSIS — I130 Hypertensive heart and chronic kidney disease with heart failure and stage 1 through stage 4 chronic kidney disease, or unspecified chronic kidney disease: Secondary | ICD-10-CM | POA: Diagnosis present

## 2018-06-16 DIAGNOSIS — Z7982 Long term (current) use of aspirin: Secondary | ICD-10-CM | POA: Insufficient documentation

## 2018-06-16 DIAGNOSIS — I5022 Chronic systolic (congestive) heart failure: Secondary | ICD-10-CM

## 2018-06-16 DIAGNOSIS — Z7902 Long term (current) use of antithrombotics/antiplatelets: Secondary | ICD-10-CM | POA: Insufficient documentation

## 2018-06-16 DIAGNOSIS — F419 Anxiety disorder, unspecified: Secondary | ICD-10-CM | POA: Insufficient documentation

## 2018-06-16 DIAGNOSIS — I2510 Atherosclerotic heart disease of native coronary artery without angina pectoris: Secondary | ICD-10-CM | POA: Diagnosis not present

## 2018-06-16 LAB — DIGOXIN LEVEL: DIGOXIN LVL: 0.3 ng/mL — AB (ref 0.8–2.0)

## 2018-06-16 LAB — BRAIN NATRIURETIC PEPTIDE: B NATRIURETIC PEPTIDE 5: 1702.3 pg/mL — AB (ref 0.0–100.0)

## 2018-06-16 LAB — BASIC METABOLIC PANEL
Anion gap: 13 (ref 5–15)
BUN: 16 mg/dL (ref 6–20)
CHLORIDE: 97 mmol/L — AB (ref 98–111)
CO2: 26 mmol/L (ref 22–32)
Calcium: 9.3 mg/dL (ref 8.9–10.3)
Creatinine, Ser: 1.57 mg/dL — ABNORMAL HIGH (ref 0.61–1.24)
GFR, EST AFRICAN AMERICAN: 55 mL/min — AB (ref >60)
GFR, EST NON AFRICAN AMERICAN: 48 mL/min — AB (ref >60)
Glucose, Bld: 115 mg/dL — ABNORMAL HIGH (ref 70–99)
POTASSIUM: 4.1 mmol/L (ref 3.5–5.1)
SODIUM: 136 mmol/L (ref 135–145)

## 2018-06-16 MED ORDER — TORSEMIDE 20 MG PO TABS: 80.0000 mg | ORAL_TABLET | Freq: Every day | ORAL | 5 refills | Status: DC

## 2018-06-16 NOTE — Progress Notes (Signed)
ReDS Vest - 06/16/18 1223      ReDS Vest   MR   Moderate    Fitting Posture  Sitting    Height Marker  Tall    Ruler Value  36   Station D   Center Strip  Aligned    ReDS Value  39      '

## 2018-06-16 NOTE — Patient Instructions (Signed)
INCREASE Torsemide to 80mg  (4 tabs) daily  You have been referred for a sleep study. They will contact you for scheduling.  You were given a script for Ted hose for your edema  Labs today We will only contact you if something comes back abnormal or we need to make some changes. Otherwise no news is good news!  Your physician recommends that you schedule a follow-up appointment in: 2 weeks in APP clinic  Your physician recommends that you schedule a follow-up appointment in: 3 months with Dr. [name] and an ECHO  Your physician has requested that you have an echocardiogram. Echocardiography is a painless test that uses sound waves to create images of your heart. It provides your doctor with information about the size and shape of your heart and how well your heart's chambers and valves are working. This procedure takes approximately one hour. There are no restrictions for this procedure.

## 2018-06-21 ENCOUNTER — Telehealth (HOSPITAL_COMMUNITY): Payer: Self-pay

## 2018-06-21 NOTE — Telephone Encounter (Signed)
Spoke to pt for phone visit this week. Pt reports he is feeling good, is aware of med changes from last week clinic visit. States his weight is 172lb, he had been running from 180s-190. Pt denies sob, he went out riding his motorcycle last week, he states his legs are no longer swollen and he feels good.  Will resume home visit next week.   Kerry Hough, EMT-Paramedic  06/21/18

## 2018-06-24 ENCOUNTER — Telehealth (HOSPITAL_COMMUNITY): Payer: Self-pay

## 2018-06-24 NOTE — Telephone Encounter (Signed)
cardiac rehab paper work faxed

## 2018-07-01 ENCOUNTER — Inpatient Hospital Stay (HOSPITAL_COMMUNITY)
Admission: EM | Admit: 2018-07-01 | Discharge: 2018-07-03 | DRG: 291 | Disposition: A | Discharge status: Home / Self Care | Payer: Medicaid Other | Attending: Cardiology | Admitting: Cardiology

## 2018-07-01 ENCOUNTER — Other Ambulatory Visit (HOSPITAL_COMMUNITY): Payer: Self-pay

## 2018-07-01 ENCOUNTER — Emergency Department (HOSPITAL_COMMUNITY): Payer: Medicaid Other

## 2018-07-01 ENCOUNTER — Encounter (HOSPITAL_COMMUNITY): Payer: Self-pay | Admitting: *Deleted

## 2018-07-01 ENCOUNTER — Other Ambulatory Visit: Payer: Self-pay

## 2018-07-01 DIAGNOSIS — E871 Hypo-osmolality and hyponatremia: Secondary | ICD-10-CM | POA: Diagnosis present

## 2018-07-01 DIAGNOSIS — I10 Essential (primary) hypertension: Secondary | ICD-10-CM | POA: Diagnosis present

## 2018-07-01 DIAGNOSIS — I248 Other forms of acute ischemic heart disease: Secondary | ICD-10-CM | POA: Diagnosis present

## 2018-07-01 DIAGNOSIS — Z801 Family history of malignant neoplasm of trachea, bronchus and lung: Secondary | ICD-10-CM

## 2018-07-01 DIAGNOSIS — I739 Peripheral vascular disease, unspecified: Secondary | ICD-10-CM | POA: Diagnosis present

## 2018-07-01 DIAGNOSIS — N183 Chronic kidney disease, stage 3 unspecified: Secondary | ICD-10-CM

## 2018-07-01 DIAGNOSIS — J9601 Acute respiratory failure with hypoxia: Secondary | ICD-10-CM | POA: Diagnosis present

## 2018-07-01 DIAGNOSIS — K219 Gastro-esophageal reflux disease without esophagitis: Secondary | ICD-10-CM | POA: Diagnosis present

## 2018-07-01 DIAGNOSIS — Z79899 Other long term (current) drug therapy: Secondary | ICD-10-CM

## 2018-07-01 DIAGNOSIS — I252 Old myocardial infarction: Secondary | ICD-10-CM | POA: Diagnosis not present

## 2018-07-01 DIAGNOSIS — F1011 Alcohol abuse, in remission: Secondary | ICD-10-CM

## 2018-07-01 DIAGNOSIS — F419 Anxiety disorder, unspecified: Secondary | ICD-10-CM | POA: Diagnosis present

## 2018-07-01 DIAGNOSIS — Z91128 Patient's intentional underdosing of medication regimen for other reason: Secondary | ICD-10-CM

## 2018-07-01 DIAGNOSIS — I255 Ischemic cardiomyopathy: Secondary | ICD-10-CM | POA: Diagnosis present

## 2018-07-01 DIAGNOSIS — G4733 Obstructive sleep apnea (adult) (pediatric): Secondary | ICD-10-CM | POA: Diagnosis present

## 2018-07-01 DIAGNOSIS — Z823 Family history of stroke: Secondary | ICD-10-CM

## 2018-07-01 DIAGNOSIS — R911 Solitary pulmonary nodule: Secondary | ICD-10-CM | POA: Diagnosis not present

## 2018-07-01 DIAGNOSIS — I2510 Atherosclerotic heart disease of native coronary artery without angina pectoris: Secondary | ICD-10-CM | POA: Diagnosis present

## 2018-07-01 DIAGNOSIS — I5023 Acute on chronic systolic (congestive) heart failure: Secondary | ICD-10-CM | POA: Diagnosis present

## 2018-07-01 DIAGNOSIS — K449 Diaphragmatic hernia without obstruction or gangrene: Secondary | ICD-10-CM | POA: Diagnosis not present

## 2018-07-01 DIAGNOSIS — I5043 Acute on chronic combined systolic (congestive) and diastolic (congestive) heart failure: Secondary | ICD-10-CM | POA: Diagnosis present

## 2018-07-01 DIAGNOSIS — I130 Hypertensive heart and chronic kidney disease with heart failure and stage 1 through stage 4 chronic kidney disease, or unspecified chronic kidney disease: Principal | ICD-10-CM | POA: Diagnosis present

## 2018-07-01 DIAGNOSIS — I25810 Atherosclerosis of coronary artery bypass graft(s) without angina pectoris: Secondary | ICD-10-CM | POA: Diagnosis present

## 2018-07-01 DIAGNOSIS — Z95820 Peripheral vascular angioplasty status with implants and grafts: Secondary | ICD-10-CM | POA: Diagnosis not present

## 2018-07-01 DIAGNOSIS — E878 Other disorders of electrolyte and fluid balance, not elsewhere classified: Secondary | ICD-10-CM | POA: Diagnosis present

## 2018-07-01 DIAGNOSIS — E785 Hyperlipidemia, unspecified: Secondary | ICD-10-CM | POA: Diagnosis present

## 2018-07-01 DIAGNOSIS — Z825 Family history of asthma and other chronic lower respiratory diseases: Secondary | ICD-10-CM | POA: Diagnosis not present

## 2018-07-01 DIAGNOSIS — Z955 Presence of coronary angioplasty implant and graft: Secondary | ICD-10-CM

## 2018-07-01 DIAGNOSIS — Z833 Family history of diabetes mellitus: Secondary | ICD-10-CM

## 2018-07-01 DIAGNOSIS — R945 Abnormal results of liver function studies: Secondary | ICD-10-CM | POA: Diagnosis not present

## 2018-07-01 DIAGNOSIS — I509 Heart failure, unspecified: Secondary | ICD-10-CM

## 2018-07-01 DIAGNOSIS — Z8249 Family history of ischemic heart disease and other diseases of the circulatory system: Secondary | ICD-10-CM

## 2018-07-01 DIAGNOSIS — R0600 Dyspnea, unspecified: Secondary | ICD-10-CM | POA: Diagnosis present

## 2018-07-01 DIAGNOSIS — Z888 Allergy status to other drugs, medicaments and biological substances status: Secondary | ICD-10-CM

## 2018-07-01 DIAGNOSIS — Z9114 Patient's other noncompliance with medication regimen: Secondary | ICD-10-CM | POA: Diagnosis not present

## 2018-07-01 DIAGNOSIS — R0602 Shortness of breath: Secondary | ICD-10-CM | POA: Diagnosis not present

## 2018-07-01 DIAGNOSIS — Z7902 Long term (current) use of antithrombotics/antiplatelets: Secondary | ICD-10-CM | POA: Diagnosis not present

## 2018-07-01 DIAGNOSIS — I110 Hypertensive heart disease with heart failure: Secondary | ICD-10-CM | POA: Diagnosis not present

## 2018-07-01 DIAGNOSIS — Z87891 Personal history of nicotine dependence: Secondary | ICD-10-CM

## 2018-07-01 DIAGNOSIS — T50996A Underdosing of other drugs, medicaments and biological substances, initial encounter: Secondary | ICD-10-CM | POA: Diagnosis present

## 2018-07-01 DIAGNOSIS — R0601 Orthopnea: Secondary | ICD-10-CM | POA: Diagnosis present

## 2018-07-01 DIAGNOSIS — R7989 Other specified abnormal findings of blood chemistry: Secondary | ICD-10-CM

## 2018-07-01 DIAGNOSIS — Z8349 Family history of other endocrine, nutritional and metabolic diseases: Secondary | ICD-10-CM

## 2018-07-01 LAB — CBC WITH DIFFERENTIAL/PLATELET
Abs Immature Granulocytes: 0.06 10*3/uL (ref 0.00–0.07)
Basophils Absolute: 0 10*3/uL (ref 0.0–0.1)
Basophils Relative: 0 %
Eosinophils Absolute: 0 10*3/uL (ref 0.0–0.5)
Eosinophils Relative: 0 %
HCT: 39.1 % (ref 39.0–52.0)
Hemoglobin: 12.6 g/dL — ABNORMAL LOW (ref 13.0–17.0)
Immature Granulocytes: 1 %
Lymphocytes Relative: 17 %
Lymphs Abs: 1.2 10*3/uL (ref 0.7–4.0)
MCH: 26.4 pg (ref 26.0–34.0)
MCHC: 32.2 g/dL (ref 30.0–36.0)
MCV: 81.8 fL (ref 80.0–100.0)
MONO ABS: 0.6 10*3/uL (ref 0.1–1.0)
MONOS PCT: 8 %
Neutro Abs: 5.2 10*3/uL (ref 1.7–7.7)
Neutrophils Relative %: 74 %
Platelets: 255 10*3/uL (ref 150–400)
RBC: 4.78 MIL/uL (ref 4.22–5.81)
RDW: 16.3 % — ABNORMAL HIGH (ref 11.5–15.5)
WBC: 7.1 10*3/uL (ref 4.0–10.5)
nRBC: 0 % (ref 0.0–0.2)

## 2018-07-01 LAB — COMPREHENSIVE METABOLIC PANEL
ALT: 146 U/L — ABNORMAL HIGH (ref 0–44)
AST: 142 U/L — ABNORMAL HIGH (ref 15–41)
Albumin: 3.9 g/dL (ref 3.5–5.0)
Alkaline Phosphatase: 164 U/L — ABNORMAL HIGH (ref 38–126)
Anion gap: 18 — ABNORMAL HIGH (ref 5–15)
BUN: 32 mg/dL — ABNORMAL HIGH (ref 6–20)
CO2: 25 mmol/L (ref 22–32)
CREATININE: 1.86 mg/dL — AB (ref 0.61–1.24)
Calcium: 9.2 mg/dL (ref 8.9–10.3)
Chloride: 88 mmol/L — ABNORMAL LOW (ref 98–111)
GFR calc Af Amer: 45 mL/min — ABNORMAL LOW (ref >60)
GFR, EST NON AFRICAN AMERICAN: 39 mL/min — AB (ref >60)
Glucose, Bld: 125 mg/dL — ABNORMAL HIGH (ref 70–99)
Potassium: 3.5 mmol/L (ref 3.5–5.1)
Sodium: 131 mmol/L — ABNORMAL LOW (ref 135–145)
Total Bilirubin: 2 mg/dL — ABNORMAL HIGH (ref 0.3–1.2)
Total Protein: 6.8 g/dL (ref 6.5–8.1)

## 2018-07-01 LAB — TROPONIN I
Troponin I: 0.18 ng/mL (ref <0.03)
Troponin I: 0.17 ng/mL (ref <0.03)

## 2018-07-01 LAB — I-STAT TROPONIN, ED: Troponin i, poc: 0.09 ng/mL (ref 0.00–0.08)

## 2018-07-01 LAB — BRAIN NATRIURETIC PEPTIDE: B Natriuretic Peptide: 4024.5 pg/mL — ABNORMAL HIGH (ref 0.0–100.0)

## 2018-07-01 MED ORDER — DIGOXIN 125 MCG PO TABS
0.1250 mg | ORAL_TABLET | Freq: Every day | ORAL | Status: DC
  Administered 2018-07-01 – 2018-07-03 (×3): 0.125 mg via ORAL
  Filled 2018-07-01 (×3): qty 1

## 2018-07-01 MED ORDER — ACETAMINOPHEN 325 MG PO TABS: 650.0000 mg | ORAL_TABLET | ORAL | Status: DC | PRN

## 2018-07-01 MED ORDER — ASPIRIN EC 81 MG PO TBEC
81.0000 mg | DELAYED_RELEASE_TABLET | Freq: Every day | ORAL | Status: DC
  Administered 2018-07-02 – 2018-07-03 (×2): 81 mg via ORAL
  Filled 2018-07-01 (×2): qty 1

## 2018-07-01 MED ORDER — ISOSORBIDE MONONITRATE ER 30 MG PO TB24
30.0000 mg | ORAL_TABLET | Freq: Every day | ORAL | Status: DC
  Administered 2018-07-01: 30 mg via ORAL
  Filled 2018-07-01: qty 1

## 2018-07-01 MED ORDER — SPIRONOLACTONE 25 MG PO TABS
25.0000 mg | ORAL_TABLET | Freq: Every day | ORAL | Status: DC
  Administered 2018-07-01 – 2018-07-03 (×3): 25 mg via ORAL
  Filled 2018-07-01 (×3): qty 1

## 2018-07-01 MED ORDER — ATORVASTATIN CALCIUM 80 MG PO TABS
80.0000 mg | ORAL_TABLET | Freq: Every evening | ORAL | Status: DC
  Administered 2018-07-01 – 2018-07-02 (×2): 80 mg via ORAL
  Filled 2018-07-01 (×2): qty 1

## 2018-07-01 MED ORDER — CITALOPRAM HYDROBROMIDE 20 MG PO TABS
20.0000 mg | ORAL_TABLET | Freq: Every day | ORAL | Status: DC
  Administered 2018-07-01 – 2018-07-03 (×3): 20 mg via ORAL
  Filled 2018-07-01 (×3): qty 1

## 2018-07-01 MED ORDER — FUROSEMIDE 10 MG/ML IJ SOLN
60.0000 mg | Freq: Once | INTRAMUSCULAR | Status: AC
  Administered 2018-07-01: 60 mg via INTRAVENOUS
  Filled 2018-07-01: qty 6

## 2018-07-01 MED ORDER — CLOPIDOGREL BISULFATE 75 MG PO TABS
75.0000 mg | ORAL_TABLET | Freq: Every day | ORAL | Status: DC
  Administered 2018-07-02 – 2018-07-03 (×2): 75 mg via ORAL
  Filled 2018-07-01 (×2): qty 1

## 2018-07-01 MED ORDER — ISOSORBIDE MONONITRATE ER 30 MG PO TB24
60.0000 mg | ORAL_TABLET | Freq: Every day | ORAL | Status: DC
  Administered 2018-07-02 – 2018-07-03 (×2): 60 mg via ORAL
  Filled 2018-07-01 (×2): qty 2

## 2018-07-01 MED ORDER — NITROGLYCERIN 0.4 MG SL SUBL: 0.4000 mg | SUBLINGUAL_TABLET | SUBLINGUAL | Status: DC | PRN

## 2018-07-01 MED ORDER — SODIUM CHLORIDE 0.9% FLUSH: 3.0000 mL | INTRAVENOUS | Status: DC | PRN

## 2018-07-01 MED ORDER — HYDRALAZINE HCL 50 MG PO TABS
100.0000 mg | ORAL_TABLET | Freq: Three times a day (TID) | ORAL | Status: DC
  Administered 2018-07-01 – 2018-07-03 (×6): 100 mg via ORAL
  Filled 2018-07-01: qty 2
  Filled 2018-07-01: qty 4
  Filled 2018-07-01 (×4): qty 2

## 2018-07-01 MED ORDER — FUROSEMIDE 10 MG/ML IJ SOLN
80.0000 mg | Freq: Two times a day (BID) | INTRAMUSCULAR | Status: DC
  Administered 2018-07-01: 80 mg via INTRAVENOUS
  Filled 2018-07-01: qty 8

## 2018-07-01 MED ORDER — EZETIMIBE 10 MG PO TABS
10.0000 mg | ORAL_TABLET | Freq: Every day | ORAL | Status: DC
  Administered 2018-07-01 – 2018-07-03 (×3): 10 mg via ORAL
  Filled 2018-07-01 (×3): qty 1

## 2018-07-01 MED ORDER — SODIUM CHLORIDE 0.9 % IV SOLN: 250.0000 mL | INTRAVENOUS | Status: DC | PRN

## 2018-07-01 MED ORDER — ONDANSETRON HCL 4 MG/2ML IJ SOLN
4.0000 mg | Freq: Four times a day (QID) | INTRAMUSCULAR | Status: DC | PRN
  Administered 2018-07-02: 4 mg via INTRAVENOUS
  Filled 2018-07-01: qty 2

## 2018-07-01 MED ORDER — POTASSIUM CHLORIDE CRYS ER 20 MEQ PO TBCR
40.0000 meq | EXTENDED_RELEASE_TABLET | Freq: Two times a day (BID) | ORAL | Status: DC
  Administered 2018-07-01 – 2018-07-03 (×4): 40 meq via ORAL
  Filled 2018-07-01 (×4): qty 2

## 2018-07-01 MED ORDER — ENOXAPARIN SODIUM 40 MG/0.4ML ~~LOC~~ SOLN
40.0000 mg | SUBCUTANEOUS | Status: DC
  Filled 2018-07-01 (×2): qty 0.4

## 2018-07-01 MED ORDER — SODIUM CHLORIDE 0.9% FLUSH
3.0000 mL | Freq: Two times a day (BID) | INTRAVENOUS | Status: DC
  Administered 2018-07-02 – 2018-07-03 (×3): 3 mL via INTRAVENOUS

## 2018-07-01 NOTE — ED Notes (Signed)
Lab to add on BNP.  °

## 2018-07-01 NOTE — ED Notes (Signed)
Lab results reported to Nurse Emily. 

## 2018-07-01 NOTE — ED Provider Notes (Addendum)
[facility] EMERGENCY DEPARTMENT Provider Note   CSN: [id number] Arrival date & time: 07/01/18  1100     History   Chief Complaint Chief Complaint  Patient presents with  . Shortness of Breath    HPI  Randy [name] is a 58 y.o. male who presents with SOB. PMH significant for CAD s/p CABG, CHF, PVD, AAA, HTN, HLD, polysubstance abuse, ETOH abuse. He had an NSTEMI in November. He was started on several new medicines. He states that he has been trying to take them but they make him feel bad. He gets nauseous and lightheaded and fatigued. He decided to stop all the medicines three days ago except for his fluid medicine and ASA. He reports progressively worsening SOB. It is worse with lying flat and better with sitting/standing up. It is not exertional. He denies fever, cough, wheezing, chest pain. He has new lower leg edema. He has a home health aide and she wanted him to come to the ED because of his symptoms. No recent surgery/travel/immobilization, hx of cancer, hemoptysis, prior DVT/PE, or hormone use. He has had weight gain.  HPI  Past Medical History:  Diagnosis Date  . AAA (abdominal aortic aneurysm) [facility]) July 2013  . Alcohol abuse   . Anxiety   . CAD (coronary artery disease)    a. s/p CABG 2010.  . Carotid artery occlusion    a. prior R CEA, stenting of L carotid 2014. b. Severe carotid restenosis in 2017 -> planned for surgery but pt did not f/u.  [name] Chronic combined systolic and diastolic CHF (congestive heart failure) (HCC)   . GERD (gastroesophageal reflux disease)   . Heart attack (HCC) 2010  . Hx of CABG 2010  . Hyperlipidemia   . Hypertension   . Lung nodule   . Polysubstance abuse (HCC)    a. mention of cocaine positivity in 2010 with patient stating unknowingly exposed, also EtOH.  [name] PVD (peripheral vascular disease) (HCC)    a. PAD s/p failed attempt at stenting of the left common iliac artery 2014.  . Stroke (HCC)   . Uncontrolled  hypertension 02/02/2012    Patient Active Problem List   Diagnosis Date Noted  . Acute on chronic congestive heart failure (HCC)   . NSTEMI (non-ST elevated myocardial infarction) (HCC) 05/12/2018  . Acute on chronic systolic (congestive) heart failure (HCC)   . Carotid stenosis, right   . Acute kidney injury superimposed on CKD (HCC)   . History of tobacco abuse 10/23/2017  . Hyperkalemia 09/18/2017  . Systolic heart failure (HCC) 07/08/2017  . Acute congestive heart failure (HCC)   . Dyspnea 03/23/2017  . Cellulitis of right ankle 01/23/2017  . Muscle spasm 10/23/2016  . Insomnia 04/11/2016  . Orthopnea 03/31/2016  . 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 HTN (hypertension) 06/15/2014  . HLD (hyperlipidemia) 04/05/2014  . PVD (peripheral vascular disease) with claudication (HCC) 05/09/2013  . GERD (gastroesophageal reflux disease) 03/28/2013  . AAA (abdominal aortic aneurysm) without rupture (HCC) 10/28/2012  . Anxiety state 02/12/2012  . Occlusion and stenosis of carotid artery without mention of cerebral infarction 01/19/2012  . Pulmonary nodule 05/19/2011  . CAD, ARTERY BYPASS GRAFT 10/19/2008  . DISC DISEASE, LUMBAR 10/19/2008    Past Surgical History:  Procedure Laterality Date  . ABDOMINAL AORTAGRAM N/A 04/19/2013   Procedure: ABDOMINAL AORTAGRAM;  Surgeon: Nada Libman, MD;  Location: [facility] CATH LAB;  Service: Cardiovascular;  Laterality: N/A;  .  CAROTID ENDARTERECTOMY Left 01-13-13   Attempted cea  . CAROTID STENT INSERTION Left 02/22/2013   Procedure: CAROTID STENT INSERTION;  Surgeon: Nada Libman, MD;  Location[facility] CATH LAB;  Service: Cardiovascular;  Laterality: Left;  . CORONARY ARTERY BYPASS GRAFT  2010  . CORONARY STENT INTERVENTION N/A 05/18/2018   Procedure: CORONARY STENT INTERVENTION;  Surgeon: Tonny Bollman, MD;  Location: [facility] INVASIVE CV LAB;  Service: Cardiovascular;  Laterality: N/A;  . CORONARY/GRAFT  ANGIOGRAPHY N/A 05/18/2018   Procedure: CORONARY/GRAFT ANGIOGRAPHY;  Surgeon: Tonny Bollman, MD;  Location: [facility] INVASIVE CV LAB;  Service: Cardiovascular;  Laterality: N/A;  . ENDARTERECTOMY Left 01/13/2013   Procedure: ATTEMPTED ENDARTERECTOMY CAROTID;  Surgeon: Nada Libman, MD;  Location: [facility] OR;  Service: Vascular;  Laterality: Left;  . RIGHT/LEFT HEART CATH AND CORONARY/GRAFT ANGIOGRAPHY N/A 12/29/2017   Procedure: RIGHT/LEFT HEART CATH AND CORONARY/GRAFT ANGIOGRAPHY;  Surgeon: Dolores Patty, MD;  Location: [facility] INVASIVE CV LAB;  Service: Cardiovascular;  Laterality: N/A;        Home Medications    Prior to Admission medications   Medication Sig Start Date End Date Taking? Authorizing Provider  aspirin EC 81 MG tablet Take 1 tablet (81 mg total) by mouth daily. 03/31/16   Howard Pouch, MD  atorvastatin (LIPITOR) 80 MG tablet Take 1 tablet (80 mg total) by mouth every evening. 02/01/18 06/10/19  Dunn, Tacey Ruiz, PA-C  citalopram (CELEXA) 20 MG tablet Take 1 tablet (20 mg total) by mouth daily. 06/14/18   Howard Pouch, MD  clopidogrel (PLAVIX) 75 MG tablet Take 1 tablet (75 mg total) by mouth daily with breakfast. 06/14/18   Bensimhon, Bevelyn Buckles, MD  digoxin (LANOXIN) 0.125 MG tablet Take 1 tablet (0.125 mg total) by mouth daily. 06/14/18   Bensimhon, Bevelyn Buckles, MD  ezetimibe (ZETIA) 10 MG tablet Take 1 tablet (10 mg total) by mouth daily. 06/14/18   Bensimhon, Bevelyn Buckles, MD  hydrALAZINE (APRESOLINE) 100 MG tablet Take 1 tablet (100 mg total) by mouth every 8 (eight) hours. 06/14/18   Bensimhon, Bevelyn Buckles, MD  isosorbide mononitrate (IMDUR) 60 MG 24 hr tablet Take 1 tablet (60 mg total) by mouth daily. 06/14/18   Bensimhon, Bevelyn Buckles, MD  lisinopril (PRINIVIL,ZESTRIL) 5 MG tablet Take 1 tablet (5 mg total) by mouth daily. 06/02/18 06/02/19  Clegg, Amy D, NP  nitroGLYCERIN (NITROSTAT) 0.4 MG SL tablet Place 1 tablet (0.4 mg total) under the tongue every 5 (five) minutes x 3 doses as needed for  chest pain. Patient not taking: Reported on 06/09/2018 05/21/18   Tonye Becket D, NP  potassium chloride SA (K-DUR,KLOR-CON) 20 MEQ tablet Take 2 tablets (40 mEq total) by mouth daily. 06/02/18   Clegg, Amy D, NP  spironolactone (ALDACTONE) 25 MG tablet Take 1 tablet (25 mg total) by mouth daily. 06/14/18 09/12/18  Bensimhon, Bevelyn Buckles, MD  torsemide (DEMADEX) 20 MG tablet Take 4 tablets (80 mg total) by mouth daily. 06/16/18 09/14/18  Alford Highland, NP    Family History Family History  Problem Relation Age of Onset  . Asthma Mother   . Diabetes Mother   . Hyperlipidemia Mother   . Hypertension Mother   . Lung cancer Father   . Cancer Father   . Stroke Sister     Social History Social History   Tobacco Use  . Smoking status: Former Smoker    Packs/day: 0.50    Years: 30.00    Pack years: 15.00    Types: Cigarettes  Last attempt to quit: 05/04/2018    Years since quitting: 0.1  . Smokeless tobacco: Never Used  Substance Use Topics  . Alcohol use: Not Currently    Alcohol/week: 5.0 standard drinks    Types: 5 Cans of beer per week    Comment: occasionally  . Drug use: Yes    Types: Marijuana     Allergies   Entresto [sacubitril-valsartan]   Review of Systems Review of Systems  Constitutional: Negative for chills and fever.  Respiratory: Positive for shortness of breath. Negative for cough and wheezing.   Cardiovascular: Positive for leg swelling. Negative for chest pain and palpitations.  Gastrointestinal: Negative for abdominal pain, nausea and vomiting.  Neurological: Negative for syncope, weakness and light-headedness.  All other systems reviewed and are negative.    Physical Exam Updated Vital Signs BP (!) 141/111   Pulse (!) 103   Temp 97.6 F (36.4 C) (Oral)   Resp 15   SpO2 100%   Physical Exam Vitals signs and nursing note reviewed.  Constitutional:      General: He is not in acute distress.    Appearance: He is well-developed.     Comments:  Calm, cooperative. Chronically ill appearing  HENT:     Head: Normocephalic and atraumatic.  Eyes:     General: No scleral icterus.       Right eye: No discharge.        Left eye: No discharge.     Conjunctiva/sclera: Conjunctivae normal.     Pupils: Pupils are equal, round, and reactive to light.  Neck:     Musculoskeletal: Normal range of motion.  Cardiovascular:     Rate and Rhythm: Normal rate.  Pulmonary:     Effort: Pulmonary effort is normal. Tachypnea present. No respiratory distress.     Breath sounds: Normal breath sounds. No decreased breath sounds, wheezing, rhonchi or rales.  Chest:     Chest wall: No tenderness.  Abdominal:     General: There is no distension.  Musculoskeletal:     Right lower leg: Edema (trace) present.     Left lower leg: Edema (trace) present.  Skin:    General: Skin is warm and dry.  Neurological:     Mental Status: He is alert and oriented to person, place, and time.  Psychiatric:        Behavior: Behavior normal.      ED Treatments / Results  Labs (all labs ordered are listed, but only abnormal results are displayed) Labs Reviewed  CBC WITH DIFFERENTIAL/PLATELET - Abnormal; Notable for the following components:      Result Value   Hemoglobin 12.6 (*)    RDW 16.3 (*)    All other components within normal limits  COMPREHENSIVE METABOLIC PANEL - Abnormal; Notable for the following components:   Sodium 131 (*)    Chloride 88 (*)    Glucose, Bld 125 (*)    BUN 32 (*)    Creatinine, Ser 1.86 (*)    AST 142 (*)    ALT 146 (*)    Alkaline Phosphatase 164 (*)    Total Bilirubin 2.0 (*)    GFR calc non Af Amer 39 (*)    GFR calc Af Amer 45 (*)    Anion gap 18 (*)    All other components within normal limits  BRAIN NATRIURETIC PEPTIDE - Abnormal; Notable for the following components:   B Natriuretic Peptide 4,024.5 (*)    All other components within normal limits  I-STAT TROPONIN, ED - Abnormal; Notable for the following components:    Troponin i, poc 0.09 (*)    All other components within normal limits    EKG EKG Interpretation  Date/Time:  Thursday July 01 2018 11:14:16 EST Ventricular Rate:  102 PR Interval:  158 QRS Duration: 108 QT Interval:  360 QTC Calculation: 469 R Axis:   92 Text Interpretation:  Sinus tachycardia Rightward axis Left ventricular hypertrophy with repolarization abnormality Abnormal ECG Confirmed by Tilden Fossaees, Elizabeth [phone]([id number]) on 07/01/2018 1:32:19 PM   Radiology Dg Chest 2 View  Result Date: 07/01/2018 CLINICAL DATA:  Shortness of breath EXAM: CHEST - 2 VIEW COMPARISON:  05/12/2018.  Chest CT 01/12/2012. FINDINGS: Chronic cardiomegaly. Status post CABG. Stable mediastinal contours. There is no edema, consolidation, effusion, or pneumothorax. 18 mm nodular density overlapping the left base. There was a left lower lobe pulmonary nodule on 2013 abdominal CT which measures smaller size. IMPRESSION: 1. Possible visualization of a 18 mm left lower lobe nodule, which would be a size increase from a CT in 2013. Recommend nonemergent chest CT. 2. No evidence of active disease. 3. Chronic cardiomegaly. Electronically Signed   By: Marnee SpringJonathon  Watts M.D.   On: 07/01/2018 12:10    Procedures Procedures (including critical care time)  Medications Ordered in ED Medications  furosemide (LASIX) injection 60 mg (60 mg Intravenous Given 07/01/18 1335)     Initial Impression / Assessment and Plan / ED Course  I have reviewed the triage vital signs and the nursing notes.  Pertinent labs & imaging results that were available during my care of the patient were reviewed by me and considered in my medical decision making (see chart for details).  58 year old male presents with worsening SOB over the past couple days after stopping his medicines. He has a very complicated medical history. He is hypertensive and mildly tachycardic and tacynpeic. Lungs are CTA. He has bilateral peripheral edema. EKG is sinus  tachycardia with LVH. CXR shows pulmonary nodule which is increased in size but no overt pulmonary edema. CBC is normal. CMP shows mild hyponatremia (131), hypochloremia (88), elevated SCr (1.86), elevated AST/ALT, bilirubin (2.0). BNP is 4,024 which is greatly increased from baseline. I stat trop is 0.09. He was given Lasix. Shared visit with Dr. Madilyn Hookees. Will discuss with Cards.  Cards to admit.   Final Clinical Impressions(s) / ED Diagnoses   Final diagnoses:  Acute on chronic congestive heart failure, unspecified heart failure type [facility](HCC)    ED Discharge Orders    None       Bethel BornGekas, Meloni Hinz Marie, PA-C 07/01/18 1522    Bethel BornGekas, Sebert Stollings Marie, PA-C 07/01/18 1522    Tilden Fossaees, Elizabeth, MD 07/05/18 [phone]

## 2018-07-01 NOTE — Progress Notes (Addendum)
Paramedicine Encounter    Patient ID: Randy [name], male    DOB: 1961-06-15, 58 y.o.   MRN: [id number]   Patient Care Team: Howard Pouch, MD as PCP - General Allayne Butcher, PA-C as Physician Assistant (Cardiology) Marvel Plan, MD as Consulting Physician (Neurology) Burna Sis, LCSW as Social Worker (Licensed Clinical Social Worker)  Patient Active Problem List   Diagnosis Date Noted  . Acute on chronic systolic heart failure (HCC) 07/01/2018  . Chronic kidney disease (CKD), stage III (moderate) (HCC) 07/01/2018  . CAD (coronary atherosclerotic disease) 07/01/2018  . H/O ETOH abuse 07/01/2018  . Acute on chronic congestive heart failure (HCC)   . NSTEMI (non-ST elevated myocardial infarction) (HCC) 05/12/2018  . Acute on chronic systolic (congestive) heart failure (HCC)   . Carotid stenosis, right   . Acute kidney injury superimposed on CKD (HCC)   . History of tobacco abuse 10/23/2017  . Hyperkalemia 09/18/2017  . Systolic heart failure (HCC) 07/08/2017  . Acute congestive heart failure (HCC)   . Dyspnea 03/23/2017  . Cellulitis of right ankle 01/23/2017  . Muscle spasm 10/23/2016  . Insomnia 04/11/2016  . Orthopnea 03/31/2016  . 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 HTN (hypertension) 06/15/2014  . HLD (hyperlipidemia) 04/05/2014  . PVD (peripheral vascular disease) with claudication (HCC) 05/09/2013  . GERD (gastroesophageal reflux disease) 03/28/2013  . AAA (abdominal aortic aneurysm) without rupture (HCC) 10/28/2012  . Anxiety state 02/12/2012  . Occlusion and stenosis of carotid artery without mention of cerebral infarction 01/19/2012  . Pulmonary nodule 05/19/2011  . CAD, ARTERY BYPASS GRAFT 10/19/2008  . DISC DISEASE, LUMBAR 10/19/2008   No current facility-administered medications for this visit.  No current outpatient medications on file.  Facility-Administered Medications Ordered in Other Visits:  .  0.9 %   sodium chloride infusion, 250 mL, Intravenous, PRN, Clegg, Amy D, NP .  acetaminophen (TYLENOL) tablet 650 mg, 650 mg, Oral, Q4H PRN, Clegg, Amy D, NP .  Melene Muller ON 07/02/2018] aspirin EC tablet 81 mg, 81 mg, Oral, Daily, Clegg, Amy D, NP .  atorvastatin (LIPITOR) tablet 80 mg, 80 mg, Oral, QPM, Clegg, Amy D, NP .  citalopram (CELEXA) tablet 20 mg, 20 mg, Oral, Daily, Clegg, Amy D, NP .  [START ON 07/02/2018] clopidogrel (PLAVIX) tablet 75 mg, 75 mg, Oral, Q breakfast, Clegg, Amy D, NP .  digoxin (LANOXIN) tablet 0.125 mg, 0.125 mg, Oral, Daily, Clegg, Amy D, NP .  enoxaparin (LOVENOX) injection 40 mg, 40 mg, Subcutaneous, Q24H, Clegg, Amy D, NP .  ezetimibe (ZETIA) tablet 10 mg, 10 mg, Oral, Daily, Clegg, Amy D, NP .  furosemide (LASIX) injection 80 mg, 80 mg, Intravenous, BID, Clegg, Amy D, NP .  hydrALAZINE (APRESOLINE) tablet 100 mg, 100 mg, Oral, Q8H, Clegg, Amy D, NP, 100 mg at 07/01/18 1539 .  [START ON 07/02/2018] isosorbide mononitrate (IMDUR) 24 hr tablet 60 mg, 60 mg, Oral, Daily, Laurey Morale, MD .  nitroGLYCERIN (NITROSTAT) SL tablet 0.4 mg, 0.4 mg, Sublingual, Q5 Min x 3 PRN, Clegg, Amy D, NP .  ondansetron (ZOFRAN) injection 4 mg, 4 mg, Intravenous, Q6H PRN, Clegg, Amy D, NP .  potassium chloride SA (K-DUR,KLOR-CON) CR tablet 40 mEq, 40 mEq, Oral, BID, Clegg, Amy D, NP .  sodium chloride flush (NS) 0.9 % injection 3 mL, 3 mL, Intravenous, Q12H, Clegg, Amy D, NP .  sodium chloride flush (NS) 0.9 % injection 3 mL, 3 mL, Intravenous, PRN, Clegg, Amy  D, NP .  spironolactone (ALDACTONE) tablet 25 mg, 25 mg, Oral, Daily, Clegg, Amy D, NP Allergies  Allergen Reactions  . Entresto [Sacubitril-Valsartan] Nausea Only and Other (See Comments)    Dizziness and "sick on stomach"      Social History   Socioeconomic History  . Marital status: Widowed    Spouse name: Not on file  . Number of children: 1  . Years of education: 9th  . Highest education level: Not on file  Occupational  History  . Occupation: not working.   Social Needs  . Financial resource strain: Not very hard  . Food insecurity:    Worry: Never true    Inability: Never true  . Transportation needs:    Medical: No    Non-medical: No  Tobacco Use  . Smoking status: Former Smoker    Packs/day: 0.50    Years: 30.00    Pack years: 15.00    Types: Cigarettes    Last attempt to quit: 05/04/2018    Years since quitting: 0.1  . Smokeless tobacco: Never Used  Substance and Sexual Activity  . Alcohol use: Not Currently    Alcohol/week: 5.0 standard drinks    Types: 5 Cans of beer per week    Comment: occasionally  . Drug use: Yes    Types: Marijuana  . Sexual activity: Yes  Lifestyle  . Physical activity:    Days per week: Not on file    Minutes per session: Not on file  . Stress: Not on file  Relationships  . Social connections:    Talks on phone: Not on file    Gets together: Not on file    Attends religious service: Not on file    Active member of club or organization: Not on file    Attends meetings of clubs or organizations: Not on file    Relationship status: Not on file  . Intimate partner violence:    Fear of current or ex partner: Not on file    Emotionally abused: Not on file    Physically abused: Not on file    Forced sexual activity: Not on file  Other Topics Concern  . Not on file  Social History Narrative   Pt is widowed and lives alone.   Patient is right handed   Patient drinks about 2cups of caffeine daily.       Physical Exam      Future Appointments  Date Time Provider Department Center  07/08/2018  9:30 AM [facility] PA/NP [facility] None  09/21/2018  9:00 AM [facility] 1-BUZZ [facility]  09/21/2018 10:20 AM Bensimhon, Bevelyn Buckles, MD [facility] None    BP (!) 160/120   Pulse (!) 102   Resp 20   Wt 175 lb (79.4 kg)   SpO2 98%   BMI 24.75 kg/m   Weight yesterday-174 Last visit weight-186  When I arrived pt reports he is not feeling well at all, past couple  days he has had +sob, unable to sleep at night, he has not taken his meds except his toresmide and asa past couple days, he feels him taking all those meds together are reacting to him. However he has been taking them together so nothing new has changed. V/s as noted--his bp is very high, he refused to go to ER by EMS, EKG showed no acute findings at this time. He reports +sob, +dizziness, +nausea, he vomited once this morning, poor appetite, no diarrhea, no fevers, pt denies c/p, no  active stroke symptoms, he reports he will get his grandson to come take him-he did call him and grandson did arrive, b/p reassessed several times and it read elevated around the 160/120s each time. Will f/u early next week.   Kerry Hough, EMT-Paramedic [phone] [facility] Paramedic  07/01/18

## 2018-07-01 NOTE — ED Triage Notes (Signed)
Pt in c/o shortness of breath for the last few days, denies cough, shortness of breath is worse with exertion but continues now at rest

## 2018-07-01 NOTE — H&P (Addendum)
Advanced Heart Failure Team H&P  Primary Physician: Howard Pouch, MD PCP-Cardiologist: Dr [name]  Reason for Consultation: Heart Failure   HPI:    Randy [name] is seen today for evaluation of heart failure at the request of Dr Madilyn Hook.   Randy [name] is a 58 year old with  a history of combined HF (diagnosed 03/2017), HTN, HLD, CAD s/p CABG 2010 and PCI (last ~2014), former tobacco use, ETOH abuse, CVA, and AAA. He is intolerant entresto.   He was initially seen in the HF clinic June 2019. At that time he was set up for heart cath and switched to entresto. He was not on spiro with a history of hyperkalemia. He underwent LHC/RHC in July. He did not require intervention. Reduced EF was thought to be related to ETOH abuse.   Admitted to [facility] 05/12/2018 with increased dyspnea and chest pain. Elevated troponin was thought to be in the setting of demand ischemia. He has an ECHO completed that showed severely reduced EF 15%. Diuresed with IV lasix but had poor response so milrinone was added. Diuresed over 20 pounds. Once adequately diuresed interventional cardiology took him to the cath lab and he underwent PCI DES to left main. After heart catherization, milrinone was gradually weaned off. He will continue on aspirin and plavix for 1 year.  He was not on bb due to low output. He was not on entresto due to intolerance and not on an ace due to elevated K and creatinine. PICC line was removed prior to discharge. Vascular consulted and repeated carotid U/S. This suggested total occlusion R ICA and 50-75% LICA. He did not require surgical intervention but will continue statin and follow up with [facility] in 1 year. Discharge weight was 168 pounds.   He has been followed closely in the HF /pharmacy clinic and has received IV lasix in the HF clinic on  2 occasions. He had also been switched to torsemide with improved diuresis. Last HF clinic was 06/16/18 and torsemide was increased to 80 mg daily. Weight at that  time was 186 pounds. He says his weight went down to 170 pounds. He has also been followed closely by HF paramedicine.   Today he presented to [facility] with increased shortness of breath and leg edema. Says he stopped taking all medications for 3 days ago due to dizziness. Since that time he has developed orthopnea and leg edema. O2 sats were 83% on room air so he was placed on 2 liters oxygen. CXR showed LLL nodule which was noted to be larger than previous. Pertinent admission labs included:  K 3.5, Creatinine 1.86, sodium 131, hgb 12.6, troponin 0.09 and BNP 4024.    In the ED he has been given 60 mg IV lasix. SOB at rest.   ECHO 04/2018 Left ventricle:  15%. Diffuse hypokinesis. Doppler parameters are consistent with restrictive physiology, indicative of decreased left ventricular diastolic compliance and/or increased left atrial pressure. Doppler parameters are consistent with high ventricular filling pressure. - Left atrium: The atrium was severely dilated. - Right ventricle: The cavity size was mildly dilated. Systolic function was moderately reduced. - Right atrium: The atrium was moderately dilated. - Tricuspid valve: There was moderate regurgitation. - Pulmonary arteries: Systolic pressure was mildly increased. PA peak pressure: 44 mm Hg (S).  ECHO 12/2017  LVEF 30-35%  RA severely dilated  RV mildly dilated  Peak PA pressure 43 mm hg  LHC 04/2018   Mid LM lesion is 90% stenosed.  Prox LAD lesion is 100% stenosed.  Ost 2nd Mrg to 2nd Mrg lesion is 100% stenosed.  2nd Mrg lesion is 99% stenosed.  Prox Cx to Mid Cx lesion is 30% stenosed.  Ost RCA to Prox RCA lesion is 100% stenosed.  Mid Graft lesion is 30% stenosed.  Origin to Prox Graft lesion is 100% stenosed.  SVG.  Prox Graft lesion is 90% stenosed.  A drug-eluting stent was successfully placed using a STENT SYNERGY DES 3X16.  Post intervention, there is a 10% residual stenosis. 1. Severe  stenosis of the native left mainstem, treated successfully with PCI using a 3.0 x 16 mm Synergy DES postdilated to high pressure with a 3.25 mm noncompliant balloon 2. Chronic occlusion of the LAD supplied by a widely patent LIMA graft 3. Severe stenosis of the saphenous vein graft to first OM with total occlusion of the OM branch just beyond the coronary anastomosis, unchanged from the previous study 4. Patent saphenous vein graft to first diagonal supplying collaterals to the obtuse marginal branch 5. Known chronic occlusion of the native RCA and saphenous vein graft RCA, not selectively injected Recommend uninterrupted dual antiplatelet therapy with Aspirin 81mg  daily and Clopidogrel 75mg  dailyfor a minimum of 12 months (ACS - Class I recommendation).   RHC/LHC 12/2017   Ost RCA to Prox RCA lesion is 100% stenosed.  Mid LM lesion is 80% stenosed.  Prox LAD lesion is 100% stenosed.  Ost 2nd Mrg to 2nd Mrg lesion is 100% stenosed.  2nd Mrg lesion is 99% stenosed.  Origin to Prox Graft lesion is 100% stenosed.  Prox Cx to Mid Cx lesion is 30% stenosed.  Mid Graft lesion is 30% stenosed. Findings: RA = 4 RV = 22/5 PA = 27/10 (16) PCW = 15 Fick cardiac output/index = 5.1/2.5 FA sat = 94% PA sat = 66%, 66% Review of Systems: [y] = yes, [ ]  = no   General: Weight gain [Y ]; Weight loss [ ] ; Anorexia [ ] ; Fatigue [ Y]; Fever [ ] ; Chills [ ] ; Weakness [ ]   Cardiac: Chest pain/pressure [ ] ; Resting SOB [Y ]; Exertional SOB [ Y]; Orthopnea [Y ]; Pedal Edema [Y ]; Palpitations [ ] ; Syncope [ ] ; Presyncope [ ] ; Paroxysmal nocturnal dyspnea[ ]   Pulmonary: Cough [ ] ; Wheezing[ ] ; Hemoptysis[ ] ; Sputum [ ] ; Snoring [ ]   GI: Vomiting[ ] ; Dysphagia[ ] ; Melena[ ] ; Hematochezia [ ] ; Heartburn[ ] ; Abdominal pain [ ] ; Constipation [ ] ; Diarrhea [ ] ; BRBPR [ ]   GU: Hematuria[ ] ; Dysuria [ ] ; Nocturia[ ]   Vascular: Pain in legs with walking [ ] ; Pain in feet with lying flat [ ] ;  Non-healing sores [ ] ; Stroke [Y ]; TIA [ ] ; Slurred speech [ ] ;  Neuro: Headaches[ ] ; Vertigo[ ] ; Seizures[ ] ; Paresthesias[ ] ;Blurred vision [ ] ; Diplopia [ ] ; Vision changes [ ]   Ortho/Skin: Arthritis [ ] ; Joint pain [Y ]; Muscle pain [ ] ; Joint swelling [ ] ; Back Pain [Y ]; Rash [ ]   Psych: Depression[Y ]; Anxiety[ ]   Heme: Bleeding problems [ ] ; Clotting disorders [ ] ; Anemia [ ]   Endocrine: Diabetes [ ] ; Thyroid dysfunction[ ]   Home Medications Prior to Admission medications   Medication Sig Start Date End Date Taking? Authorizing Provider  aspirin EC 81 MG tablet Take 1 tablet (81 mg total) by mouth daily. 03/31/16   Howard Pouch, MD  atorvastatin (LIPITOR) 80 MG tablet Take 1 tablet (80 mg total) by mouth every evening. 02/01/18 06/10/19  Dunn,  Dayna N, PA-C  citalopram (CELEXA) 20 MG tablet Take 1 tablet (20 mg total) by mouth daily. 06/14/18   Howard Pouch, MD  clopidogrel (PLAVIX) 75 MG tablet Take 1 tablet (75 mg total) by mouth daily with breakfast. 06/14/18   Bensimhon, Bevelyn Buckles, MD  digoxin (LANOXIN) 0.125 MG tablet Take 1 tablet (0.125 mg total) by mouth daily. 06/14/18   Bensimhon, Bevelyn Buckles, MD  ezetimibe (ZETIA) 10 MG tablet Take 1 tablet (10 mg total) by mouth daily. 06/14/18   Bensimhon, Bevelyn Buckles, MD  hydrALAZINE (APRESOLINE) 100 MG tablet Take 1 tablet (100 mg total) by mouth every 8 (eight) hours. 06/14/18   Bensimhon, Bevelyn Buckles, MD  isosorbide mononitrate (IMDUR) 60 MG 24 hr tablet Take 1 tablet (60 mg total) by mouth daily. 06/14/18   Bensimhon, Bevelyn Buckles, MD  lisinopril (PRINIVIL,ZESTRIL) 5 MG tablet Take 1 tablet (5 mg total) by mouth daily. 06/02/18 06/02/19  Clegg, Amy D, NP  nitroGLYCERIN (NITROSTAT) 0.4 MG SL tablet Place 1 tablet (0.4 mg total) under the tongue every 5 (five) minutes x 3 doses as needed for chest pain. Patient not taking: Reported on 06/09/2018 05/21/18   Tonye Becket D, NP  potassium chloride SA (K-DUR,KLOR-CON) 20 MEQ tablet Take 2 tablets (40 mEq total)  by mouth daily. 06/02/18   Clegg, Amy D, NP  spironolactone (ALDACTONE) 25 MG tablet Take 1 tablet (25 mg total) by mouth daily. 06/14/18 09/12/18  Bensimhon, Bevelyn Buckles, MD  torsemide (DEMADEX) 20 MG tablet Take 4 tablets (80 mg total) by mouth daily. 06/16/18 09/14/18  Alford Highland, NP    Past Medical History: Past Medical History:  Diagnosis Date  . AAA (abdominal aortic aneurysm) [facility]) July 2013  . Alcohol abuse   . Anxiety   . CAD (coronary artery disease)    a. s/p CABG 2010.  . Carotid artery occlusion    a. prior R CEA, stenting of L carotid 2014. b. Severe carotid restenosis in 2017 -> planned for surgery but pt did not f/u.  [name] Chronic combined systolic and diastolic CHF (congestive heart failure) (HCC)   . GERD (gastroesophageal reflux disease)   . Heart attack (HCC) 2010  . Hx of CABG 2010  . Hyperlipidemia   . Hypertension   . Lung nodule   . Polysubstance abuse (HCC)    a. mention of cocaine positivity in 2010 with patient stating unknowingly exposed, also EtOH.  [name] PVD (peripheral vascular disease) (HCC)    a. PAD s/p failed attempt at stenting of the left common iliac artery 2014.  . Stroke (HCC)   . Uncontrolled hypertension 02/02/2012    Past Surgical History: Past Surgical History:  Procedure Laterality Date  . ABDOMINAL AORTAGRAM N/A 04/19/2013   Procedure: ABDOMINAL AORTAGRAM;  Surgeon: Nada Libman, MD;  Location: [facility] CATH LAB;  Service: Cardiovascular;  Laterality: N/A;  . CAROTID ENDARTERECTOMY Left 01-13-13   Attempted cea  . CAROTID STENT INSERTION Left 02/22/2013   Procedure: CAROTID STENT INSERTION;  Surgeon: Nada Libman, MD;  Location: [facility] CATH LAB;  Service: Cardiovascular;  Laterality: Left;  . CORONARY ARTERY BYPASS GRAFT  2010  . CORONARY STENT INTERVENTION N/A 05/18/2018   Procedure: CORONARY STENT INTERVENTION;  Surgeon: Tonny Bollman, MD;  Location[facility] INVASIVE CV LAB;  Service: Cardiovascular;  Laterality: N/A;  . CORONARY/GRAFT  ANGIOGRAPHY N/A 05/18/2018   Procedure: CORONARY/GRAFT ANGIOGRAPHY;  Surgeon: Tonny Bollman, MD;  Location: [facility] INVASIVE CV LAB;  Service: Cardiovascular;  Laterality: N/A;  .  ENDARTERECTOMY Left 01/13/2013   Procedure: ATTEMPTED ENDARTERECTOMY CAROTID;  Surgeon: Nada Libman, MD;  Location: [facility] OR;  Service: Vascular;  Laterality: Left;  . RIGHT/LEFT HEART CATH AND CORONARY/GRAFT ANGIOGRAPHY N/A 12/29/2017   Procedure: RIGHT/LEFT HEART CATH AND CORONARY/GRAFT ANGIOGRAPHY;  Surgeon: Dolores Patty, MD;  Location: [facility] INVASIVE CV LAB;  Service: Cardiovascular;  Laterality: N/A;    Family History: Family History  Problem Relation Age of Onset  . Asthma Mother   . Diabetes Mother   . Hyperlipidemia Mother   . Hypertension Mother   . Lung cancer Father   . Cancer Father   . Stroke Sister     Social History: Social History   Socioeconomic History  . Marital status: Widowed    Spouse name: Not on file  . Number of children: 1  . Years of education: 9th  . Highest education level: Not on file  Occupational History  . Occupation: not working.   Social Needs  . Financial resource strain: Not very hard  . Food insecurity:    Worry: Never true    Inability: Never true  . Transportation needs:    Medical: No    Non-medical: No  Tobacco Use  . Smoking status: Former Smoker    Packs/day: 0.50    Years: 30.00    Pack years: 15.00    Types: Cigarettes    Last attempt to quit: 05/04/2018    Years since quitting: 0.1  . Smokeless tobacco: Never Used  Substance and Sexual Activity  . Alcohol use: Not Currently    Alcohol/week: 5.0 standard drinks    Types: 5 Cans of beer per week    Comment: occasionally  . Drug use: Yes    Types: Marijuana  . Sexual activity: Yes  Lifestyle  . Physical activity:    Days per week: Not on file    Minutes per session: Not on file  . Stress: Not on file  Relationships  . Social connections:    Talks on phone: Not on file    Gets together:  Not on file    Attends religious service: Not on file    Active member of club or organization: Not on file    Attends meetings of clubs or organizations: Not on file    Relationship status: Not on file  Other Topics Concern  . Not on file  Social History Narrative   Pt is widowed and lives alone.   Patient is right handed   Patient drinks about 2cups of caffeine daily.       Allergies:  Allergies  Allergen Reactions  . Entresto [Sacubitril-Valsartan]     Dizziness, sick on stomach    Objective:    Vital Signs:   Temp:  [97.6 F (36.4 C)] 97.6 F (36.4 C) (01/02 1108) Pulse Rate:  [97-106] 105 (01/02 1330) Resp:  [15-27] 26 (01/02 1330) BP: (141-171)/(104-120) 142/109 (01/02 1330) SpO2:  [98 %-100 %] 99 % (01/02 1330) Weight:  [79.4 kg] 79.4 kg (01/02 1340)    Weight change: Filed Weights   07/01/18 1340  Weight: 79.4 kg    Intake/Output:  No intake or output data in the 24 hours ending 07/01/18 1426    Physical Exam    General:  Sitting on the side of the stretcher. . No resp difficulty HEENT: normal Neck: supple. JVP to jaw  . Carotids 2+ bilat; no bruits. No lymphadenopathy or thyromegaly appreciated. Cor: PMI nondisplaced. Regular rate & rhythm. No rubs, or murmurs. +  S3 Lungs: clear on 2 liters oxygen.  Abdomen: soft, nontender, nondistended. No hepatosplenomegaly. No bruits or masses. Good bowel sounds. Extremities: no cyanosis, clubbing, rash, R and LLE 2+ edema Neuro: alert & orientedx3, cranial nerves grossly intact. moves all 4 extremities w/o difficulty. Affect pleasant   Telemetry   Sinus Tach 100s   EKG    Sinus Tach 102 bpm QRS 108 bpm   Labs   Basic Metabolic Panel: Recent Labs  Lab 07/01/18 1130  NA 131*  K 3.5  CL 88*  CO2 25  GLUCOSE 125*  BUN 32*  CREATININE 1.86*  CALCIUM 9.2    Liver Function Tests: Recent Labs  Lab 07/01/18 1130  AST 142*  ALT 146*  ALKPHOS 164*  BILITOT 2.0*  PROT 6.8  ALBUMIN 3.9   No  results for input(s): LIPASE, AMYLASE in the last 168 hours. No results for input(s): AMMONIA in the last 168 hours.  CBC: Recent Labs  Lab 07/01/18 1130  WBC 7.1  NEUTROABS 5.2  HGB 12.6*  HCT 39.1  MCV 81.8  PLT 255    Cardiac Enzymes: No results for input(s): CKTOTAL, CKMB, CKMBINDEX, TROPONINI in the last 168 hours.  BNP: BNP (last 3 results) Recent Labs    06/02/18 1004 06/09/18 1418 06/16/18 1216  BNP 3,189.0* 1,323.0* 1,702.3*    ProBNP (last 3 results) No results for input(s): PROBNP in the last 8760 hours.   CBG: No results for input(s): GLUCAP in the last 168 hours.  Coagulation Studies: No results for input(s): LABPROT, INR in the last 72 hours.   Imaging   Dg Chest 2 View  Result Date: 07/01/2018 CLINICAL DATA:  Shortness of breath EXAM: CHEST - 2 VIEW COMPARISON:  05/12/2018.  Chest CT 01/12/2012. FINDINGS: Chronic cardiomegaly. Status post CABG. Stable mediastinal contours. There is no edema, consolidation, effusion, or pneumothorax. 18 mm nodular density overlapping the left base. There was a left lower lobe pulmonary nodule on 2013 abdominal CT which measures smaller size. IMPRESSION: 1. Possible visualization of a 18 mm left lower lobe nodule, which would be a size increase from a CT in 2013. Recommend nonemergent chest CT. 2. No evidence of active disease. 3. Chronic cardiomegaly. Electronically Signed   By: Marnee SpringJonathon  Watts M.D.   On: 07/01/2018 12:10      Medications:     Current Medications:    Infusions:      Patient Profile   Randy Evonnie Patropst is a 58 year old with  a history of combined HF (diagnosed 03/2017), HTN, HLD, CAD s/p CABG 2010 and PCI (last ~2014), former tobacco use, ETOH abuse, CVA, and AAA. He is intolerant entresto.   Admitted with A/C systolic HF in the setting of medication noncompliance.    Assessment/Plan   1. Acute Hypoxic Respiratory Failure  O2 sats 83% on room air. Sats improved to 100% on 2 liters.  2 Acute  Chronic Systolic HF: Ischemic cardiomyopathy.  ECHO 2017 55% ECHO 04/2018 EF 15%. ? Possible ETOH abuse.  Volume status elevated. Start 80 mg IV lasix every 8 hrs. If poor diuresis may need to place PICC and use milrinone.  No bb with low output.  He was intolerant entresto.  - Hold lisinopril with creatinine up to 1.86.  Can continue digoxin and spironolactone for now.   - Restart hydralazine 100 mg three times a day and imdur 60 mg daily.   3. LLL nodule CXR nodule has increased in size. Obtain CT once he improves.   4.  CAD S/P CABG 2010   LHC7/2/19 showed patent LIMA to LAD, SVG to RCA occluded, SVG to OM-2 with 95% prox lesion. Has unprotected AV-groove LCX which feeds collaterals to distal RCA.  - 04/2018 S/P DES to left main.  - Plan for dual antiplatelet therapy with asa mg daily + plavix for minimum of 12 months.  - No s/s ischemia. Troponin 0.09.  -Restart ASA, plavix, statin.  - no bb for now with recent low output.   5. HTN Elevated. Restart hydralazine/imdur now.   6. CKD Stage III Creatinine baseline 1.2-1.4 - Creatinine up to 1.8. BMET in am.   7. H/O ETOH abuse Previously a heavy drinker. He has cut back and rarely drinks alcohol.   8. Tobacco Abuse Former  9. OSA  Suspected sleep apnea. Snores. Needs sleep study.   Admit to 3 east to diurese and restart HF medications.  Continue HF Paramedicine.  He will need close follow up. Discussed importance of medications compliance.    Medication concerns reviewed with patient and pharmacy team. Barriers identified: none   Length of Stay: 0  Tonye Becket, NP  07/01/2018, 2:26 PM  Advanced Heart Failure Team Pager [phone] (M-F; 7a - 4p)  Please contact [facility] for night-coverage after hours (4p -7a ) and weekends on [email or web address]  Patient seen with NP, agree with the above note.  He is admitted today with significant volume overload, dyspnea, and orthopnea in the setting of medication noncompliance. He has  taken no meds x 3 days.  He was hypoxemic at admission. No chest pain.   On exam, JVP 16 cm.  Regular S1S2, +S3.  1+ edema to knees bilaterally.   Patient will be admitted for diuresis. - Start Lasix 80 mg IV every 8 hrs and follow UOP and renal function.  Has required milrinone in the past.  - Hold lisinopril with creatinine up a little to 1.8 but can get digoxin and spironolactone.  - Restart hydralazine 100 mg tid with Imdur 30.    No chest pain, mild troponin elevation, suspect demand ischemia.   Marca Ancona 07/01/2018 3:55 PM

## 2018-07-01 NOTE — ED Notes (Signed)
Came in room to pt standing at side of bed stating he is SOB, o2 sat 83% room air, Pt placed on 2 L o2 and encouraged to stay in bed and use call light to get up.

## 2018-07-02 ENCOUNTER — Inpatient Hospital Stay (HOSPITAL_COMMUNITY): Payer: Medicaid Other

## 2018-07-02 DIAGNOSIS — I5023 Acute on chronic systolic (congestive) heart failure: Secondary | ICD-10-CM

## 2018-07-02 LAB — BASIC METABOLIC PANEL
Anion gap: 14 (ref 5–15)
BUN: 27 mg/dL — ABNORMAL HIGH (ref 6–20)
CO2: 27 mmol/L (ref 22–32)
Calcium: 8.8 mg/dL — ABNORMAL LOW (ref 8.9–10.3)
Chloride: 93 mmol/L — ABNORMAL LOW (ref 98–111)
Creatinine, Ser: 1.59 mg/dL — ABNORMAL HIGH (ref 0.61–1.24)
GFR calc Af Amer: 54 mL/min — ABNORMAL LOW (ref >60)
GFR calc non Af Amer: 47 mL/min — ABNORMAL LOW (ref >60)
Glucose, Bld: 133 mg/dL — ABNORMAL HIGH (ref 70–99)
Potassium: 3.2 mmol/L — ABNORMAL LOW (ref 3.5–5.1)
Sodium: 134 mmol/L — ABNORMAL LOW (ref 135–145)

## 2018-07-02 LAB — ECHOCARDIOGRAM COMPLETE
Height: 71 in
Weight: 2713.6 oz

## 2018-07-02 LAB — DIGOXIN LEVEL: Digoxin Level: <0.2 ng/mL — ABNORMAL LOW (ref 0.8–2.0)

## 2018-07-02 LAB — TROPONIN I: Troponin I: 0.16 ng/mL (ref <0.03)

## 2018-07-02 MED ORDER — POTASSIUM CHLORIDE CRYS ER 20 MEQ PO TBCR
40.0000 meq | EXTENDED_RELEASE_TABLET | Freq: Once | ORAL | Status: AC
  Administered 2018-07-02: 40 meq via ORAL
  Filled 2018-07-02: qty 2

## 2018-07-02 MED ORDER — FUROSEMIDE 10 MG/ML IJ SOLN
80.0000 mg | Freq: Three times a day (TID) | INTRAMUSCULAR | Status: DC
  Administered 2018-07-02 – 2018-07-03 (×4): 80 mg via INTRAVENOUS
  Filled 2018-07-02 (×4): qty 8

## 2018-07-02 NOTE — Progress Notes (Signed)
Patient began to cough followed by vomiting. Patient given Zofran.

## 2018-07-02 NOTE — Progress Notes (Addendum)
Advanced Heart Failure Rounding Note  PCP-Cardiologist: No primary care provider on file.   Subjective:    Started on 80 mg IV lasix q8 hours yesterday. Received a dose 80 and a dose of 60 so far with brisk diuresis. He is net negative -3.5 L. Weight is down 10 lbs. Creatinine improving with diuresis, 1.59 today.  Trop 0.18 > 0.17 > 0.16  Denies CP or SOB. Walking around room with no problems. Coughed hard overnight and vomited x1. Orthopnea improved. First time he's slept through the night in days.   Objective:   Weight Range: 76.9 kg Body mass index is 23.65 kg/m.   Vital Signs:   Temp:  [97.6 F (36.4 C)-98.4 F (36.9 C)] 98.4 F (36.9 C) (01/03 0611) Pulse Rate:  [87-111] 87 (01/03 0611) Resp:  [13-28] 18 (01/03 0611) BP: (104-171)/(68-120) 104/68 (01/03 0611) SpO2:  [96 %-100 %] 96 % (01/03 0611) Weight:  [76.9 kg-80.2 kg] 76.9 kg (01/03 0006) Last BM Date: 07/01/18  Weight change: Filed Weights   07/01/18 1340 07/01/18 1616 07/02/18 0006  Weight: 79.4 kg 80.2 kg 76.9 kg    Intake/Output:   Intake/Output Summary (Last 24 hours) at 07/02/2018 0725 Last data filed at 07/02/2018 0200 Gross per 24 hour  Intake 960 ml  Output 4450 ml  Net -3490 ml      Physical Exam    General:  Lying in bed. No resp difficulty HEENT: Normal Neck: Supple. JVP to ear. Carotids 2+ bilat; no bruits. No lymphadenopathy or thyromegaly appreciated. Cor: PMI nondisplaced. Regular rate & rhythm, slightly tachy. +s3 Lungs: Clear Abdomen: Soft, nontender, nondistended. No hepatosplenomegaly. No bruits or masses. Good bowel sounds. Extremities: No cyanosis, clubbing, rash, BLE 1+ edema to knees. Neuro: Alert & orientedx3, cranial nerves grossly intact. moves all 4 extremities w/o difficulty. Affect pleasant   Telemetry   Sinus tach 100s. Personally reviewed.   EKG    No new tracings.   Labs    CBC Recent Labs    07/01/18 1130  WBC 7.1  NEUTROABS 5.2  HGB 12.6*  HCT  39.1  MCV 81.8  PLT 255   Basic Metabolic Panel Recent Labs    73/54/30 1130 07/02/18 0427  NA 131* 134*  K 3.5 3.2*  CL 88* 93*  CO2 25 27  GLUCOSE 125* 133*  BUN 32* 27*  CREATININE 1.86* 1.59*  CALCIUM 9.2 8.8*   Liver Function Tests Recent Labs    07/01/18 1130  AST 142*  ALT 146*  ALKPHOS 164*  BILITOT 2.0*  PROT 6.8  ALBUMIN 3.9   No results for input(s): LIPASE, AMYLASE in the last 72 hours. Cardiac Enzymes Recent Labs    07/01/18 1825 07/01/18 2157 07/02/18 0427  TROPONINI 0.18* 0.17* 0.16*    BNP: BNP (last 3 results) Recent Labs    06/09/18 1418 06/16/18 1216 07/01/18 1130  BNP 1,323.0* 1,702.3* 4,024.5*    ProBNP (last 3 results) No results for input(s): PROBNP in the last 8760 hours.   D-Dimer No results for input(s): DDIMER in the last 72 hours. Hemoglobin A1C No results for input(s): HGBA1C in the last 72 hours. Fasting Lipid Panel No results for input(s): CHOL, HDL, LDLCALC, TRIG, CHOLHDL, LDLDIRECT in the last 72 hours. Thyroid Function Tests No results for input(s): TSH, T4TOTAL, T3FREE, THYROIDAB in the last 72 hours.  Invalid input(s): FREET3  Other results:   Imaging    Dg Chest 2 View  Result Date: 07/01/2018 CLINICAL DATA:  Shortness of  breath EXAM: CHEST - 2 VIEW COMPARISON:  05/12/2018.  Chest CT 01/12/2012. FINDINGS: Chronic cardiomegaly. Status post CABG. Stable mediastinal contours. There is no edema, consolidation, effusion, or pneumothorax. 18 mm nodular density overlapping the left base. There was a left lower lobe pulmonary nodule on 2013 abdominal CT which measures smaller size. IMPRESSION: 1. Possible visualization of a 18 mm left lower lobe nodule, which would be a size increase from a CT in 2013. Recommend nonemergent chest CT. 2. No evidence of active disease. 3. Chronic cardiomegaly. Electronically Signed   By: Marnee Spring M.D.   On: 07/01/2018 12:10      Medications:     Scheduled Medications: .  aspirin EC  81 mg Oral Daily  . atorvastatin  80 mg Oral QPM  . citalopram  20 mg Oral Daily  . clopidogrel  75 mg Oral Q breakfast  . digoxin  0.125 mg Oral Daily  . enoxaparin (LOVENOX) injection  40 mg Subcutaneous Q24H  . ezetimibe  10 mg Oral Daily  . furosemide  80 mg Intravenous BID  . hydrALAZINE  100 mg Oral Q8H  . isosorbide mononitrate  60 mg Oral Daily  . potassium chloride  40 mEq Oral BID  . sodium chloride flush  3 mL Intravenous Q12H  . spironolactone  25 mg Oral Daily     Infusions: . sodium chloride       PRN Medications:  sodium chloride, acetaminophen, nitroGLYCERIN, ondansetron (ZOFRAN) IV, sodium chloride flush    Patient Profile   Randy [name] is a 58 year old with a history of combined HF (diagnosed 03/2017), HTN, HLD, CAD s/p CABG 2010 and PCI (last ~2014), former tobacco use, ETOH abuse, CVA, and AAA. He is intolerant entresto.   Admitted with A/C systolic HF in the setting of medication noncompliance.    Assessment/Plan   1. Acute Hypoxic Respiratory Failure  O2 sats 83% on room air. Sats improved to 100% on 2 liters. Remains on 2L this morning.   2 Acute Chronic Systolic HF: Ischemic cardiomyopathy.  ECHO 2017 55% ECHO 04/2018 EF 15%. ? Possible ETOH abuse.  - Volume status remains elevated. Continue 80 mg IV lasix every 8 hrs. Good response to IV lasix so far, so will hold off on PICC and milrinone. - No bb with low output.  - He was intolerant entresto.  - Hold lisinopril with creatinine up to 1.86.   - Continue digoxin and spironolactone 25 mg daily.   - Continue hydralazine 100 mg three times a day and imdur 60 mg daily.   3. LLL nodule - CXR nodule has increased in size. Obtain CT once he improves. May be able to do today.   4. CAD S/P CABG 2010   LHC7/2/19 showed patent LIMA to LAD, SVG to RCA occluded, SVG to OM-2 with 95% prox lesion. Has unprotected AV-groove LCX which feeds collaterals to distal RCA.  - 04/2018 S/P DES to  left main.  - Plan for dual antiplatelet therapy with asa mg daily + plavix for minimum of 12 months.  - No s/s ishcemia. Trop 0.18 > 0.17 > 0.16.  - Restart ASA, plavix, statin.  - no bb for now with recent low output.   5. HTN - Improved back on meds. SBP 100-110s  6. CKD Stage III - Creatinine baseline 1.2-1.4 - Creatinine improving with diuresis, 1.59 today. Continue to follow.  7. H/O ETOH abuse - Previously a heavy drinker. He has cut back and rarely drinks alcohol.  8. Tobacco Abuse - Former. No change  9. OSA - Suspected sleep apnea. Snores. Needs sleep study.  10. Hypokalemia - K 3.2. Give addition 40 meq.     Length of Stay: 1  Randy Highland, NP  07/02/2018, 7:25 AM  Advanced Heart Failure Team Pager [phone] (M-F; 7a - 4p)  Please contact [facility] for night-coverage after hours (4p -7a ) and weekends on [email or web address]  Patient seen with NP, agree with the above note.   He has diuresed well and weight is coming down.  Creatinine lower.  On exam, he is still volume overloaded.  - Continue Lasix 80 mg IV every 8 hrs today.   - Repeat echo today.   Mild troponin elevation with no trend, no chest pain.  Suspect demand ischemia.   He will get a noncontrast CT chest to followup on lung nodule.   Marca Ancona 07/02/2018 3:38 PM

## 2018-07-02 NOTE — Significant Event (Signed)
Patient is upset about his food and after receiving education about heart failure and that patient should monitor how much fluids he is consuming. RN noted patient as been getting refills of water from the tap on multiple occasions during medication passed. Patient stated he is always thirsty and has been drinking soda and water whenever he wants. RN gave information on the importance of monitoring fluids intake and that he is on a 2g sodium restriction for his diet, which is why he is getting the food he gets on his tray. Patient became more upset, stating that he was not told this before and that yesterday he has been liberal to take all fluids and food that he wanted and today all of a sudden, he is no longer allowed to. RN explained that it is not our intention to starve him or deprive him of all fluids which patient believes is happening. Patient stated it didn't matter what RN was telling him and that he has family bringing him food and drinks. RN will continue to give education and support as patient allows.        Randy [name]

## 2018-07-02 NOTE — Progress Notes (Signed)
  Echocardiogram 2D Echocardiogram has been performed.  Randy [name] 07/02/2018, 2:07 PM

## 2018-07-03 DIAGNOSIS — N183 Chronic kidney disease, stage 3 (moderate): Secondary | ICD-10-CM

## 2018-07-03 LAB — BASIC METABOLIC PANEL
Anion gap: 10 (ref 5–15)
BUN: 20 mg/dL (ref 6–20)
CO2: 28 mmol/L (ref 22–32)
Calcium: 8.7 mg/dL — ABNORMAL LOW (ref 8.9–10.3)
Chloride: 92 mmol/L — ABNORMAL LOW (ref 98–111)
Creatinine, Ser: 1.49 mg/dL — ABNORMAL HIGH (ref 0.61–1.24)
GFR calc Af Amer: 59 mL/min — ABNORMAL LOW (ref >60)
GFR, EST NON AFRICAN AMERICAN: 51 mL/min — AB (ref >60)
Glucose, Bld: 113 mg/dL — ABNORMAL HIGH (ref 70–99)
Potassium: 3.7 mmol/L (ref 3.5–5.1)
Sodium: 130 mmol/L — ABNORMAL LOW (ref 135–145)

## 2018-07-03 MED ORDER — LISINOPRIL 5 MG PO TABS: 5.0000 mg | ORAL_TABLET | Freq: Every day | ORAL | Status: DC

## 2018-07-03 MED ORDER — SACUBITRIL-VALSARTAN 24-26 MG PO TABS
1.0000 | ORAL_TABLET | Freq: Two times a day (BID) | ORAL | Status: DC
  Filled 2018-07-03: qty 1

## 2018-07-03 NOTE — Discharge Summary (Signed)
Discharge Summary    Patient ID: Randy [name] MRN: [id number]; DOB: 17-Nov-1960  Admit date: 07/01/2018 Discharge date: 07/03/2018  Primary Care Provider: Howard Pouch, MD  Primary Cardiologist: Arvilla Meres, MD  Primary Electrophysiologist:  None   Discharge Diagnoses    Principal Problem:   Acute on chronic systolic heart failure (HCC) Active Problems:   Pulmonary nodule   HLD (hyperlipidemia)   HTN (hypertension)   Orthopnea   Dyspnea   History of tobacco abuse   Chronic kidney disease (CKD), stage III (moderate) (HCC)   CAD (coronary atherosclerotic disease)   H/O ETOH abuse   Allergies Allergies  Allergen Reactions  . Entresto [Sacubitril-Valsartan] Nausea Only and Other (See Comments)    Dizziness and "sick on stomach"    Diagnostic Studies/Procedures    Echocardiogram 07/02/2018 Study Conclusions  - Left ventricle: The cavity size was mildly dilated. Wall   thickness was normal. Systolic function was severely reduced. The   estimated ejection fraction was in the range of 20% to 25%.   Diffuse hypokinesis. Doppler parameters are consistent with a   reversible restrictive pattern, indicative of decreased left   ventricular diastolic compliance and/or increased left atrial   pressure (grade 3 diastolic dysfunction). - Left atrium: The atrium was severely dilated. - Right ventricle: The cavity size was moderately dilated. Wall   thickness was normal. Systolic function was moderately reduced. - Right atrium: The atrium was severely dilated. - Pulmonary arteries: Systolic pressure was mildly increased. PA   peak pressure: 37 mm Hg (S). _____________   History of Present Illness     Randy [name] is a 58 year old with a history of combined HF (diagnosed 03/2017), HTN, HLD, CAD s/p CABG 2010 and PCI (last ~2014), former tobacco use, ETOH abuse, CVA, and AAA. He is intolerant entresto.   He was initially seen in the HF clinic June 2019. At that time he was set up  for heart cath and switched to entresto. He was not on spiro with a history of hyperkalemia. He underwent LHC/RHC in July. He did not require intervention. Reduced EF was thought to be related to ETOH abuse.   Admitted to [facility] 05/12/2018 with increased dyspnea and chest pain. Elevated troponin was thought to be in the setting of demand ischemia. He has an ECHO completed that showed severely reduced EF 15%. Diuresed with IV lasix but had poor response so milrinone was added. Diuresed over 20 pounds. Once adequately diuresed interventional cardiology took him to the cath lab and he underwent PCI DES to left main. After heart catherization, milrinone was gradually weaned off. He will continue on aspirin and plavix for 1 year.  He was not on bb due to low output. He was not on entresto due to intolerance and not on an ace due to elevated K and creatinine. PICC line was removed prior to discharge. Vascular consulted and repeated carotid U/S. This suggested total occlusion R ICA and 50-75% LICA. He did not require surgical intervention but will continue statin and follow up with [facility] in 1 year. Discharge weight was 168 pounds.   He has been followed closely in the HF /pharmacy clinic and has received IV lasix in the HF clinic on  2 occasions. He had also been switched to torsemide with improved diuresis. Last HF clinic was 06/16/18 and torsemide was increased to 80 mg daily. Weight at that time was 186 pounds. He says his weight went down to 170 pounds. He has also been followed closely  by HF paramedicine.   [facility]2018 [facility] increased shortness of breath and leg edema. Says he stopped taking all medications for 3 days ago due to dizziness. Since that time he has developed orthopnea and leg edema. O2 sats were 83% on room air so he was placed on 2 liters oxygen. CXR showed LLL nodule which was noted to be larger than previous. Pertinent admission labs included:  K 3.5, Creatinine 1.86, sodium  131, hgb 12.6, troponin 0.09 and BNP 4024.     Hospital Course     Consultants: none  Randy [name] has been diuresed with Lasix IV every 8 hours.  Weight is down approximately 7 pounds.  Echo showed EF 20-25%, moderately dilated RV With moderately decreased systolic function.  He is now back on room air.  Treated problems: 1. Acute Hypoxic Respiratory Failure: Due to CHF/pulmonary edema.  He is now back on room air.   2. Acute Chronic Systolic HF: Ischemic cardiomyopathy.ECHO 2017 55%.  ECHO 04/2018 EF 15%. ? Possible ETOH abuse.  Echo this admission with EF 20-25%.  He was admitted after stopping all his meds for 3 days.  He has diuresed well since admission but still volume overloaded.  Dr. Shirlee LatchMcLean recommended that he get at least another day of IV Lasix but he is insistent on going home.   - Will send him home on torsemide 80 mg daily (home dose), he had been off this for 3 days.  - No bb with low output.  - He was intolerant entresto. - Can restart lisinopril 5 mg daily with creatinine down to 1.49.   - Continue digoxin and spironolactone 25 mg daily.  - Continue hydralazine 100 mg three times a day and imdur60mg  daily.   3. LLL nodule: This is seen again on CT this admission. He will need followup CT in 3 months or PET/CT, will make decision on this as outpatient.   4. CAD S/P CABG 2010: LHC7/2/19 showed patent LIMA to LAD, SVG to RCA occluded, SVG to OM-2 with 95% prox lesion. Has unprotected AV-groove LCX which feeds collaterals to distal RCA.  11/2019S/P DES to left main. No chest pain.  Mild elevation in troponin this admission is likely demand ischemia.  - ContinueASA, plavix, statin.  5. HTN: BP elevated, restart lisinopril.   6. CKD Stage III: Creatinine lower with diuresis, 1.49 today.   7. H/O ETOH abuse: Previously a heavy drinker. He has cut back and rarely drinks alcohol.  8. Tobacco Abuse: Former. No change  9. OSA: Suspected sleep apnea. Snores. Needs  sleep study.  10. Disposition: Patient is insistent on going home today though I recommended 1 more day of IV Lasix at least.  Will arrange followup in CHF clinic in 10 days or less.  Will need followup of LLL nodule as noted above.  Meds for home: Torsemide 80 mg daily, ASA 81 daily, atorvastatin 80 daily, Plavix 75 daily, lisinopril 5 daily, spironolactone 25 daily, hydralazine 100 tid, Imdur 60 daily, digoxin 0.125, Zetia 10, KCl 40 daily.    Patient has been seen by Dr. Shirlee LatchMcLean today and deemed ready for discharge home. All follow up appointments have been scheduled. Discharge medications are listed below.  _____________  Discharge Vitals Blood pressure (!) 134/94, pulse 82, temperature 97.7 F (36.5 C), temperature source Oral, resp. rate 20, height 5\' 11"  (1.803 m), weight 76.6 kg, SpO2 98 %.  Filed Weights   07/01/18 1616 07/02/18 0006 07/03/18 0347  Weight: 80.2  kg 76.9 kg 76.6 kg    Labs & Radiologic Studies    CBC Recent Labs    07/01/18 1130  WBC 7.1  NEUTROABS 5.2  HGB 12.6*  HCT 39.1  MCV 81.8  PLT 255   Basic Metabolic Panel Recent Labs    95/62/1299/09/16 0427 07/03/18 0527  NA 134* 130*  K 3.2* 3.7  CL 93* 92*  CO2 27 28  GLUCOSE 133* 113*  BUN 27* 20  CREATININE 1.59* 1.49*  CALCIUM 8.8* 8.7*   Liver Function Tests Recent Labs    07/01/18 1130  AST 142*  ALT 146*  ALKPHOS 164*  BILITOT 2.0*  PROT 6.8  ALBUMIN 3.9   No results for input(s): LIPASE, AMYLASE in the last 72 hours. Cardiac Enzymes Recent Labs    07/01/18 1825 07/01/18 2157 07/02/18 0427  TROPONINI 0.18* 0.17* 0.16*   BNP Invalid input(s): POCBNP D-Dimer No results for input(s): DDIMER in the last 72 hours. Hemoglobin A1C No results for input(s): HGBA1C in the last 72 hours. Fasting Lipid Panel No results for input(s): CHOL, HDL, LDLCALC, TRIG, CHOLHDL, LDLDIRECT in the last 72 hours. Thyroid Function Tests No results for input(s): TSH, T4TOTAL, T3FREE, THYROIDAB in the last  72 hours.  Invalid input(s): FREET3 _____________  Dg Chest 2 View  Result Date: 07/01/2018 CLINICAL DATA:  Shortness of breath EXAM: CHEST - 2 VIEW COMPARISON:  05/12/2018.  Chest CT 01/12/2012. FINDINGS: Chronic cardiomegaly. Status post CABG. Stable mediastinal contours. There is no edema, consolidation, effusion, or pneumothorax. 18 mm nodular density overlapping the left base. There was a left lower lobe pulmonary nodule on 2013 abdominal CT which measures smaller size. IMPRESSION: 1. Possible visualization of a 18 mm left lower lobe nodule, which would be a size increase from a CT in 2013. Recommend nonemergent chest CT. 2. No evidence of active disease. 3. Chronic cardiomegaly. Electronically Signed   By: Marnee SpringJonathon  Watts M.D.   On: 07/01/2018 12:10   Ct Chest Wo Contrast  Result Date: 07/02/2018 CLINICAL DATA:  58 year old male with possible pulmonary nodule seen on chest x-ray EXAM: CT CHEST WITHOUT CONTRAST TECHNIQUE: Multidetector CT imaging of the chest was performed following the standard protocol without IV contrast. COMPARISON:  Chest x-ray 07/01/2018 FINDINGS: Cardiovascular: Limited evaluation in the absence of intravenous contrast. Atherosclerotic calcifications present throughout the aorta and coronary arteries. Cardiomegaly. No pericardial effusion. Patient is status post median sternotomy with evidence of multivessel CABG. Mediastinum/Nodes: No evidence of thyroid or mediastinal mass. Mildly enlarged mediastinal lymph nodes the largest of which is a low right paratracheal station node at 1.2 cm in short axis. Small hiatal hernia. Lungs/Pleura: Approximately 1.7 x 1.6 x 1.5 cm macrolobulated nodule in the central aspect of the left lower lobe (image 97, series 4). The nodule is quite low in attenuation measuring in the negative Hounsfield units. This corresponds with the abnormality on the recent prior chest x-ray. No additional pulmonary mass or nodule identified. Upper Abdomen: No acute  abnormality. Musculoskeletal: No chest wall mass or suspicious bone lesions identified. IMPRESSION: 1. Positive for a macrolobulated 1.7 x 1.6 x 1.5 cm low-density left lower lobe pulmonary nodule. Differential considerations include hamartoma, primary bronchogenic carcinoma, and less likely (given isolated finding) metastatic disease. Consider one of the following in 3 months for both low-risk and high-risk individuals: (a) repeat chest CT, (b) follow-up PET-CT, or (c) tissue sampling. This recommendation follows the consensus statement: Guidelines for Management of Incidental Pulmonary Nodules Detected on CT Images: From the Fleischner Society  2017; Radiology 2017; [id number]. 2.  Aortic Atherosclerosis (ICD10-170.0) 3. Coronary artery calcifications. 4. Small hiatal hernia. Electronically Signed   By: Malachy Moan M.D.   On: 07/02/2018 16:36   Disposition   Pt is being discharged home today in good condition.  Follow-up Plans & Appointments    Follow-up Information    [facility] Follow up.   Specialty:  Cardiology Why:  Hospital follow-up on 07/08/2018 at 9:30 AM with the heart failure clinic. Contact information: [address] [id number] mc [address] [place] [address] [phone]       Howard Pouch, MD.   Specialty:  [facility] Contact information: [address] [place] [address] [phone]          Discharge Instructions    (HEART FAILURE PATIENTS) Call MD:  Anytime you have any of the following symptoms: 1) 3 pound weight gain in 24 hours or 5 pounds in 1 week 2) shortness of breath, with or without a dry hacking cough 3) swelling in the hands, feet or stomach 4) if you have to sleep on extra pillows at night in order to breathe.   Complete by:  As directed    Diet - low sodium heart healthy   Complete by:  As directed    Increase activity slowly   Complete by:  As directed       Discharge  Medications   Allergies as of 07/03/2018      Reactions   Entresto [sacubitril-valsartan] Nausea Only, Other (See Comments)   Dizziness and "sick on stomach"      Medication List    TAKE these medications   aspirin EC 81 MG tablet Take 1 tablet (81 mg total) by mouth daily.   atorvastatin 80 MG tablet Commonly known as:  LIPITOR Take 1 tablet (80 mg total) by mouth every evening.   citalopram 20 MG tablet Commonly known as:  CELEXA Take 1 tablet (20 mg total) by mouth daily.   clopidogrel 75 MG tablet Commonly known as:  PLAVIX Take 1 tablet (75 mg total) by mouth daily with breakfast.   digoxin 0.125 MG tablet Commonly known as:  LANOXIN Take 1 tablet (0.125 mg total) by mouth daily.   ezetimibe 10 MG tablet Commonly known as:  ZETIA Take 1 tablet (10 mg total) by mouth daily.   hydrALAZINE 100 MG tablet Commonly known as:  APRESOLINE Take 1 tablet (100 mg total) by mouth every 8 (eight) hours.   isosorbide mononitrate 60 MG 24 hr tablet Commonly known as:  IMDUR Take 1 tablet (60 mg total) by mouth daily.   lisinopril 5 MG tablet Commonly known as:  PRINIVIL,ZESTRIL Take 1 tablet (5 mg total) by mouth daily.   nitroGLYCERIN 0.4 MG SL tablet Commonly known as:  NITROSTAT Place 1 tablet (0.4 mg total) under the tongue every 5 (five) minutes x 3 doses as needed for chest pain.   potassium chloride SA 20 MEQ tablet Commonly known as:  K-DUR,KLOR-CON Take 2 tablets (40 mEq total) by mouth daily.   spironolactone 25 MG tablet Commonly known as:  ALDACTONE Take 1 tablet (25 mg total) by mouth daily.   torsemide 20 MG tablet Commonly known as:  DEMADEX Take 4 tablets (80 mg total) by mouth daily.        Acute coronary syndrome (MI, NSTEMI, STEMI, etc) this admission?: No.    Outstanding Labs/Studies   Bmet at follow up  Duration of Discharge Encounter  Greater than 30 minutes including physician time.  Signed, Berton Bon, NP 07/03/2018, 2:28  PM

## 2018-07-03 NOTE — Progress Notes (Signed)
Patient ID: Randy [name], male   DOB: 06-Feb-1961, 58 y.o.   MRN: [id number]     Advanced Heart Failure Rounding Note  PCP-Cardiologist: No primary care provider on file.   Subjective:    He diuresed again on Lasix 80 mg IV q8 hrs with I/Os negative. Weight coming down.   Trop 0.18 > 0.17 > 0.16  Echo: EF 20-25%, moderately dilated RV with moderately decreased systolic function.   Denies CP or SOB.  Feels much better and insistent on going home.   Objective:   Weight Range: 76.6 kg Body mass index is 23.56 kg/m.   Vital Signs:   Temp:  [97.7 F (36.5 C)-99.2 F (37.3 C)] 97.7 F (36.5 C) (01/04 1139) Pulse Rate:  [79-93] 82 (01/04 1139) Resp:  [16-20] 20 (01/04 1139) BP: (107-140)/(80-95) 134/94 (01/04 1139) SpO2:  [95 %-99 %] 98 % (01/04 1139) Weight:  [76.6 kg] 76.6 kg (01/04 0347) Last BM Date: 07/01/18  Weight change: Filed Weights   07/01/18 1616 07/02/18 0006 07/03/18 0347  Weight: 80.2 kg 76.9 kg 76.6 kg    Intake/Output:   Intake/Output Summary (Last 24 hours) at 07/03/2018 1221 Last data filed at 07/03/2018 0900 Gross per 24 hour  Intake 1500 ml  Output 3500 ml  Net -2000 ml      Physical Exam    General: NAD Neck: JVP 12 cm, no thyromegaly or thyroid nodule.  Lungs: Clear to auscultation bilaterally with normal respiratory effort. CV: Nondisplaced PMI.  Heart regular S1/S2, no S3/S4, no murmur.  No peripheral edema.   Abdomen: Soft, nontender, no hepatosplenomegaly, no distention.  Skin: Intact without lesions or rashes.  Neurologic: Alert and oriented x 3.  Psych: Normal affect. Extremities: No clubbing or cyanosis.  HEENT: Normal.    Telemetry   NSR 80s-90s   Labs    CBC Recent Labs    07/01/18 1130  WBC 7.1  NEUTROABS 5.2  HGB 12.6*  HCT 39.1  MCV 81.8  PLT 255   Basic Metabolic Panel Recent Labs    92/44/62 0427 07/03/18 0527  NA 134* 130*  K 3.2* 3.7  CL 93* 92*  CO2 27 28  GLUCOSE 133* 113*  BUN 27* 20  CREATININE  1.59* 1.49*  CALCIUM 8.8* 8.7*   Liver Function Tests Recent Labs    07/01/18 1130  AST 142*  ALT 146*  ALKPHOS 164*  BILITOT 2.0*  PROT 6.8  ALBUMIN 3.9   No results for input(s): LIPASE, AMYLASE in the last 72 hours. Cardiac Enzymes Recent Labs    07/01/18 1825 07/01/18 2157 07/02/18 0427  TROPONINI 0.18* 0.17* 0.16*    BNP: BNP (last 3 results) Recent Labs    06/09/18 1418 06/16/18 1216 07/01/18 1130  BNP 1,323.0* 1,702.3* 4,024.5*    ProBNP (last 3 results) No results for input(s): PROBNP in the last 8760 hours.   D-Dimer No results for input(s): DDIMER in the last 72 hours. Hemoglobin A1C No results for input(s): HGBA1C in the last 72 hours. Fasting Lipid Panel No results for input(s): CHOL, HDL, LDLCALC, TRIG, CHOLHDL, LDLDIRECT in the last 72 hours. Thyroid Function Tests No results for input(s): TSH, T4TOTAL, T3FREE, THYROIDAB in the last 72 hours.  Invalid input(s): FREET3  Other results:   Imaging    Ct Chest Wo Contrast  Result Date: 07/02/2018 CLINICAL DATA:  58 year old male with possible pulmonary nodule seen on chest x-ray EXAM: CT CHEST WITHOUT CONTRAST TECHNIQUE: Multidetector CT imaging of the chest was performed following the  standard protocol without IV contrast. COMPARISON:  Chest x-ray 07/01/2018 FINDINGS: Cardiovascular: Limited evaluation in the absence of intravenous contrast. Atherosclerotic calcifications present throughout the aorta and coronary arteries. Cardiomegaly. No pericardial effusion. Patient is status post median sternotomy with evidence of multivessel CABG. Mediastinum/Nodes: No evidence of thyroid or mediastinal mass. Mildly enlarged mediastinal lymph nodes the largest of which is a low right paratracheal station node at 1.2 cm in short axis. Small hiatal hernia. Lungs/Pleura: Approximately 1.7 x 1.6 x 1.5 cm macrolobulated nodule in the central aspect of the left lower lobe (image 97, series 4). The nodule is quite low  in attenuation measuring in the negative Hounsfield units. This corresponds with the abnormality on the recent prior chest x-ray. No additional pulmonary mass or nodule identified. Upper Abdomen: No acute abnormality. Musculoskeletal: No chest wall mass or suspicious bone lesions identified. IMPRESSION: 1. Positive for a macrolobulated 1.7 x 1.6 x 1.5 cm low-density left lower lobe pulmonary nodule. Differential considerations include hamartoma, primary bronchogenic carcinoma, and less likely (given isolated finding) metastatic disease. Consider one of the following in 3 months for both low-risk and high-risk individuals: (a) repeat chest CT, (b) follow-up PET-CT, or (c) tissue sampling. This recommendation follows the consensus statement: Guidelines for Management of Incidental Pulmonary Nodules Detected on CT Images: From the Fleischner Society 2017; Radiology 2017; 284:228-243. 2.  Aortic Atherosclerosis (ICD10-170.0) 3. Coronary artery calcifications. 4. Small hiatal hernia. Electronically Signed   By: Malachy Moan M.D.   On: 07/02/2018 16:36     Medications:     Scheduled Medications: . aspirin EC  81 mg Oral Daily  . atorvastatin  80 mg Oral QPM  . citalopram  20 mg Oral Daily  . clopidogrel  75 mg Oral Q breakfast  . digoxin  0.125 mg Oral Daily  . enoxaparin (LOVENOX) injection  40 mg Subcutaneous Q24H  . ezetimibe  10 mg Oral Daily  . furosemide  80 mg Intravenous Q8H  . hydrALAZINE  100 mg Oral Q8H  . isosorbide mononitrate  60 mg Oral Daily  . lisinopril  5 mg Oral Daily  . potassium chloride  40 mEq Oral BID  . sodium chloride flush  3 mL Intravenous Q12H  . spironolactone  25 mg Oral Daily    Infusions: . sodium chloride      PRN Medications: sodium chloride, acetaminophen, nitroGLYCERIN, ondansetron (ZOFRAN) IV, sodium chloride flush    Patient Profile   Randy [name] is a 58 year old with a history of combined HF (diagnosed 03/2017), HTN, HLD, CAD s/p CABG 2010 and  PCI (last ~2014), former tobacco use, ETOH abuse, CVA, and AAA. He is intolerant entresto.   Admitted with A/C systolic HF in the setting of medication noncompliance.    Assessment/Plan   1. Acute Hypoxic Respiratory Failure: Due to CHF/pulmonary edema.  He is now back on room air.  2 Acute Chronic Systolic HF: Ischemic cardiomyopathy.  ECHO 2017 55%.  ECHO 04/2018 EF 15%. ? Possible ETOH abuse.  Echo this admission with EF 20-25%.  He was admitted after stopping all his meds for 3 days.  He has diuresed well since admission but still volume overloaded.  I recommended that he get at least another day of IV Lasix but he is insistent on going home.   - Will send him home on torsemide 80 mg daily (home dose), he had been off this for 3 days.  - No bb with low output.  - He was intolerant entresto.  -  Can restart lisinopril 5 mg daily with creatinine down to 1.49.    - Continue digoxin and spironolactone 25 mg daily.   - Continue hydralazine 100 mg three times a day and imdur 60 mg daily.  3. LLL nodule: This is seen again on CT this admission. He will need followup CT in 3 months or PET/CT, will make decision on this as outpatient.  4. CAD S/P CABG 2010: LHC7/2/19 showed patent LIMA to LAD, SVG to RCA occluded, SVG to OM-2 with 95% prox lesion. Has unprotected AV-groove LCX which feeds collaterals to distal RCA.  04/2018 S/P DES to left main. No chest pain.  Mild elevation in troponin this admission is likely demand ischemia.  - Continue ASA, plavix, statin.  5. HTN: BP elevated, restart lisinopril.  6. CKD Stage III: Creatinine lower with diuresis, 1.49 today.  7. H/O ETOH abuse: Previously a heavy drinker. He has cut back and rarely drinks alcohol.  8. Tobacco Abuse: Former. No change 9. OSA: Suspected sleep apnea. Snores. Needs sleep study. 10. Disposition: Patient is insistent on going home today though I recommended 1 more day of IV Lasix at least.  Will arrange followup in CHF clinic in  10 days or less.  Will need followup of LLL nodule as noted above.  Meds for home: Torsemide 80 mg daily, ASA 81 daily, atorvastatin 80 daily, Plavix 75 daily, lisinopril 5 daily, spironolactone 25 daily, hydralazine 100 tid, Imdur 60 daily, digoxin 0.125, Zetia 10, KCl 40 daily.    Length of Stay: 2  Marca Ancona, MD  07/03/2018, 12:21 PM  Advanced Heart Failure Team Pager [phone] (M-F; 7a - 4p)  Please contact [facility] for night-coverage after hours (4p -7a ) and weekends on [email or web address]

## 2018-07-06 ENCOUNTER — Other Ambulatory Visit (HOSPITAL_COMMUNITY): Payer: Self-pay

## 2018-07-06 NOTE — Progress Notes (Signed)
Paramedicine Encounter    Patient ID: Randy [name], male    DOB: 06-04-1961, 58 y.o.   MRN: [id number]   Patient Care Team: Howard Pouch, MD as PCP - General Bensimhon, Bevelyn Buckles, MD as PCP - Cardiology (Cardiology) Deborha Payment as Physician Assistant (Cardiology) Marvel Plan, MD as Consulting Physician (Neurology) Burna Sis, LCSW as Social Worker (Licensed Clinical Social Worker)  Patient Active Problem List   Diagnosis Date Noted  . Acute on chronic systolic heart failure (HCC) 07/01/2018  . Chronic kidney disease (CKD), stage III (moderate) (HCC) 07/01/2018  . CAD (coronary atherosclerotic disease) 07/01/2018  . H/O ETOH abuse 07/01/2018  . Acute on chronic congestive heart failure (HCC)   . NSTEMI (non-ST elevated myocardial infarction) (HCC) 05/12/2018  . Acute on chronic systolic (congestive) heart failure (HCC)   . Carotid stenosis, right   . Acute kidney injury superimposed on CKD (HCC)   . History of tobacco abuse 10/23/2017  . Hyperkalemia 09/18/2017  . Systolic heart failure (HCC) 07/08/2017  . Acute congestive heart failure (HCC)   . Dyspnea 03/23/2017  . Cellulitis of right ankle 01/23/2017  . Muscle spasm 10/23/2016  . Insomnia 04/11/2016  . Orthopnea 03/31/2016  . 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 HTN (hypertension) 06/15/2014  . HLD (hyperlipidemia) 04/05/2014  . PVD (peripheral vascular disease) with claudication (HCC) 05/09/2013  . GERD (gastroesophageal reflux disease) 03/28/2013  . AAA (abdominal aortic aneurysm) without rupture (HCC) 10/28/2012  . Anxiety state 02/12/2012  . Occlusion and stenosis of carotid artery without mention of cerebral infarction 01/19/2012  . Pulmonary nodule 05/19/2011  . CAD, ARTERY BYPASS GRAFT 10/19/2008  . DISC DISEASE, LUMBAR 10/19/2008    Current Outpatient Medications:  .  aspirin EC 81 MG tablet, Take 1 tablet (81 mg total) by mouth daily., Disp: 30 tablet,  Rfl: 2 .  atorvastatin (LIPITOR) 80 MG tablet, Take 1 tablet (80 mg total) by mouth every evening., Disp: 90 tablet, Rfl: 3 .  citalopram (CELEXA) 20 MG tablet, Take 1 tablet (20 mg total) by mouth daily., Disp: 90 tablet, Rfl: 0 .  clopidogrel (PLAVIX) 75 MG tablet, Take 1 tablet (75 mg total) by mouth daily with breakfast., Disp: 30 tablet, Rfl: 5 .  digoxin (LANOXIN) 0.125 MG tablet, Take 1 tablet (0.125 mg total) by mouth daily., Disp: 30 tablet, Rfl: 5 .  ezetimibe (ZETIA) 10 MG tablet, Take 1 tablet (10 mg total) by mouth daily., Disp: 30 tablet, Rfl: 5 .  hydrALAZINE (APRESOLINE) 100 MG tablet, Take 1 tablet (100 mg total) by mouth every 8 (eight) hours., Disp: 90 tablet, Rfl: 5 .  isosorbide mononitrate (IMDUR) 60 MG 24 hr tablet, Take 1 tablet (60 mg total) by mouth daily., Disp: 30 tablet, Rfl: 5 .  lisinopril (PRINIVIL,ZESTRIL) 5 MG tablet, Take 1 tablet (5 mg total) by mouth daily., Disp: 30 tablet, Rfl: 11 .  potassium chloride SA (K-DUR,KLOR-CON) 20 MEQ tablet, Take 2 tablets (40 mEq total) by mouth daily., Disp: 60 tablet, Rfl: 3 .  spironolactone (ALDACTONE) 25 MG tablet, Take 1 tablet (25 mg total) by mouth daily., Disp: 30 tablet, Rfl: 5 .  torsemide (DEMADEX) 20 MG tablet, Take 4 tablets (80 mg total) by mouth daily., Disp: 120 tablet, Rfl: 5 .  nitroGLYCERIN (NITROSTAT) 0.4 MG SL tablet, Place 1 tablet (0.4 mg total) under the tongue every 5 (five) minutes x 3 doses as needed for chest pain. (Patient not taking: Reported on 07/06/2018),  Disp: 25 tablet, Rfl: 12 Allergies  Allergen Reactions  . Entresto [Sacubitril-Valsartan] Nausea Only and Other (See Comments)    Dizziness and "sick on stomach"      Social History   Socioeconomic History  . Marital status: Widowed    Spouse name: Not on file  . Number of children: 1  . Years of education: 9th  . Highest education level: Not on file  Occupational History  . Occupation: not working.   Social Needs  . Financial  resource strain: Not very hard  . Food insecurity:    Worry: Never true    Inability: Never true  . Transportation needs:    Medical: No    Non-medical: No  Tobacco Use  . Smoking status: Former Smoker    Packs/day: 0.50    Years: 30.00    Pack years: 15.00    Types: Cigarettes    Last attempt to quit: 05/04/2018    Years since quitting: 0.1  . Smokeless tobacco: Never Used  Substance and Sexual Activity  . Alcohol use: Not Currently    Alcohol/week: 5.0 standard drinks    Types: 5 Cans of beer per week    Comment: occasionally  . Drug use: Yes    Types: Marijuana  . Sexual activity: Yes  Lifestyle  . Physical activity:    Days per week: Not on file    Minutes per session: Not on file  . Stress: Not on file  Relationships  . Social connections:    Talks on phone: Not on file    Gets together: Not on file    Attends religious service: Not on file    Active member of club or organization: Not on file    Attends meetings of clubs or organizations: Not on file    Relationship status: Not on file  . Intimate partner violence:    Fear of current or ex partner: Not on file    Emotionally abused: Not on file    Physically abused: Not on file    Forced sexual activity: Not on file  Other Topics Concern  . Not on file  Social History Narrative   Pt is widowed and lives alone.   Patient is right handed   Patient drinks about 2cups of caffeine daily.       Physical Exam      Future Appointments  Date Time Provider Department Center  07/08/2018  9:30 AM [facility] PA/NP [facility] None  07/13/2018  2:30 PM Howard Pouch, MD [facility]  09/21/2018  9:00 AM [facility] E[facility] 1-BUZZ [facility]  09/21/2018 10:20 AM Bensimhon, Bevelyn Buckles, MD [facility] None    BP (!) 94/58   Pulse 68   Resp 14   Wt 174 lb (78.9 kg)   SpO2 98%   BMI 24.27 kg/m   B/p standing-90/64  Weight yesterday-176 Last visit weight-175  First home visit since he has been out of hosp, he stopped taking his  meds for 3 days which led him to become sob and when I came out to visit I ended up sending him to ER.  He stayed for a few nights and was able to be d/c. He has continued to resume his meds, he is spacing them out now to avoid any dizziness and nausea interactions. I advised him if this happens again then to call me and we can figure it out together instead of him stopping his meds for 3 days.  He reports feeling much better, he  denies any dizziness, no nausea, no sob. His legs look way better.  He states he is cutting back on his fluid intake as well, he is trying to suck on hard candy but they are not sugar free, advised him to try the sugar free.  Lungs clear, v/s as noted-b/p on the lower side but no orthostatic changes. At his last clinic visit his b/p was on the soft side as well.  Pt does his own pill box, no errors noted.   Kerry Hough, EMT-Paramedic [phone] [facility] Paramedic  07/06/18

## 2018-07-07 NOTE — Progress Notes (Signed)
Advanced Heart Failure Clinic Note   Referring Physician: Dr. Mosetta Putt PCP: Howard Pouch, MD HF Cardiologist: Dr [name] VVS: Dr Randie Heinz   HPI: Randy [name] is a 58 y.o. male with history of combined HF (diagnosed 03/2017), HTN, HLD, CAD s/p CABG 2010 and PCI (last ~2014), former tobacco use, CVA, and AAA. He is intolerant entresto.   Admitted 03/2017 with CHF exacerbation. Echo showed EF 30-35% with grade 2 DD. Cardiology was consulted and he refused [facility] for further work up.   Quit smoking Jan 1. Smoked for ~50 years prior 1 ppd. ETOH use (Bud Light) 18 pack/week, decreased from 18-24 beers daily. Has not smoked or had alcohol since 05/12/2018.   He was initially seen in the HF clinic June 2019. At that time he was set up for heart cath and switched to entresto. He was not on spiro with a history of hyperkalemia. He underwent LHC/RHC in July. He did not require intervention. Reduced EF was thought to be related to ETOH abuse.   Admitted to [facility] 05/12/2018 with increased dyspnea and chest pain. Elevated troponin was thought to be in the setting of demand ischemia. He has an ECHO completed that showed severely reduced EF 15%. Diuresed with IV lasix but had poor response so milrinone was added. Diuresed over 20 pounds. Once adequately diuresed interventional cardiology took him to the cath lab and he underwent PCI DES to left main. After heart catherization, milrinone was gradually weaned off. He will continue on aspirin and plavix for 1 year.  He was not on bb due to low output. He was not on entresto due to intolerance and not on an ace due to elevated K and creatinine. PICC line was removed prior to discharge. Vascular consulted and repeated carotid U/S. This suggested total occlusion R ICA and 50-75% LICA. He did not require surgical intervention but will continue statin and follow up with VVS in 1 year. Discharge weight was 168 pounds.   Admitted 1/2-07/03/18 with volume overload after stopping  medications for 3 days. He was hypoxic on arrival to ED. He diuresed with IV lasix. Repeat echo showed EF 20-25% (improved from 15%). He had a CT of chest that showed LLL nodule with recommendations of repeat CT vs PET in 3 months. DC weight: 168 lbs.   He returns today for post hospital follow up. He has been doing really well since discharge. Weights have gone down 3 more lbs on home scale since he got home. Denies SOB, but has not been as active due to cold weather. Moves around the house with no problems. Denies orthopnea, PND, or edema. No CP. Rare lightheadedness, but has improved with spacing out his medications. No cough. He is now limiting fluid and salt intake. Katie with HF paramedicine has been helping with this. Appetite okay. Taking all medications. No tobacco use. Only 4 beers since November. Weights 174 -> 171 lbs today at home.   CT Chest 07/02/2018 1. Positive for a macrolobulated 1.7 x 1.6 x 1.5 cm low-density left lower lobe pulmonary nodule. Differential considerations include hamartoma, primary bronchogenic carcinoma, and less likely (given isolated finding) metastatic disease. Consider one of the following in 3 months for both low-risk and high-risk individuals: (a) repeat chest CT, (b) follow-up PET-CT, or (c) tissue sampling. This recommendation follows the consensus statement: Guidelines for Management of Incidental Pulmonary Nodules Detected on CT Images: From the Fleischner Society 2017; Radiology 2017; 284:228-243. 2.  Aortic Atherosclerosis (ICD10-170.0) 3. Coronary artery calcifications. 4. Small hiatal  hernia.  Echo 07/02/2018: - Left ventricle: The cavity size was mildly dilated. Wall   thickness was normal. Systolic function was severely reduced. The   estimated ejection fraction was in the range of 20% to 25%.   Diffuse hypokinesis. Doppler parameters are consistent with a   reversible restrictive pattern, indicative of decreased left   ventricular diastolic  compliance and/or increased left atrial   pressure (grade 3 diastolic dysfunction). - Left atrium: The atrium was severely dilated. - Right ventricle: The cavity size was moderately dilated. Wall   thickness was normal. Systolic function was moderately reduced. - Right atrium: The atrium was severely dilated. - Pulmonary arteries: Systolic pressure was mildly increased. PA   peak pressure: 37 mm Hg (S).  ECHO 04/2018 Left ventricle:  15%. Diffuse hypokinesis. Doppler   parameters are consistent with restrictive physiology, indicative   of decreased left ventricular diastolic compliance and/or   increased left atrial pressure. Doppler parameters are consistent   with high ventricular filling pressure. - Left atrium: The atrium was severely dilated. - Right ventricle: The cavity size was mildly dilated. Systolic   function was moderately reduced. - Right atrium: The atrium was moderately dilated. - Tricuspid valve: There was moderate regurgitation. - Pulmonary arteries: Systolic pressure was mildly increased. PA   peak pressure: 44 mm Hg (S).  ECHO 12/2017  LVEF 30-35%  RA severely dilated  RV mildly dilated  Peak PA pressure 43 mm hg  LHC 04/2018   Mid LM lesion is 90% stenosed.  Prox LAD lesion is 100% stenosed.  Ost 2nd Mrg to 2nd Mrg lesion is 100% stenosed.  2nd Mrg lesion is 99% stenosed.  Prox Cx to Mid Cx lesion is 30% stenosed.  Ost RCA to Prox RCA lesion is 100% stenosed.  Mid Graft lesion is 30% stenosed.  Origin to Prox Graft lesion is 100% stenosed.  SVG.  Prox Graft lesion is 90% stenosed.  A drug-eluting stent was successfully placed using a STENT SYNERGY DES 3X16.  Post intervention, there is a 10% residual stenosis.  1.  Severe stenosis of the native left mainstem, treated successfully with PCI using a 3.0 x 16 mm Synergy DES postdilated to high pressure with a 3.25 mm noncompliant balloon 2.  Chronic occlusion of the LAD supplied by a widely patent  LIMA graft 3.  Severe stenosis of the saphenous vein graft to first OM with total occlusion of the OM branch just beyond the coronary anastomosis, unchanged from the previous study 4.  Patent saphenous vein graft to first diagonal supplying collaterals to the obtuse marginal branch 5.  Known chronic occlusion of the native RCA and saphenous vein graft RCA, not selectively injected Recommend uninterrupted dual antiplatelet therapy with Aspirin 81mg  daily and Clopidogrel 75mg  daily for a minimum of 12 months (ACS - Class I recommendation).   RHC/LHC 12/2017   Ost RCA to Prox RCA lesion is 100% stenosed.  Mid LM lesion is 80% stenosed.  Prox LAD lesion is 100% stenosed.  Ost 2nd Mrg to 2nd Mrg lesion is 100% stenosed.  2nd Mrg lesion is 99% stenosed.  Origin to Prox Graft lesion is 100% stenosed.  Prox Cx to Mid Cx lesion is 30% stenosed.  Mid Graft lesion is 30% stenosed.  Findings: RA = 4 RV = 22/5 PA = 27/10 (16) PCW = 15 Fick cardiac output/index = 5.1/2.5 FA sat = 94% PA sat = 66%, 66%  Review of systems complete and found to be negative unless listed in  HPI.   Past Medical History:  Diagnosis Date  . AAA (abdominal aortic aneurysm) [facility](HCC) July 2013  . Alcohol abuse   . Anxiety   . CAD (coronary artery disease)    a. s/p CABG 2010.  . Carotid artery occlusion    a. prior R CEA, stenting of L carotid 2014. b. Severe carotid restenosis in 2017 -> planned for surgery but pt did not f/u.  [name]. Chronic combined systolic and diastolic CHF (congestive heart failure) (HCC)   . GERD (gastroesophageal reflux disease)   . Heart attack (HCC) 2010  . Hx of CABG 2010  . Hyperlipidemia   . Hypertension   . Lung nodule   . Polysubstance abuse (HCC)    a. mention of cocaine positivity in 2010 with patient stating unknowingly exposed, also EtOH.  [name]. PVD (peripheral vascular disease) (HCC)    a. PAD s/p failed attempt at stenting of the left common iliac artery 2014.  . Stroke (HCC)    . Uncontrolled hypertension 02/02/2012    Current Outpatient Medications  Medication Sig Dispense Refill  . aspirin EC 81 MG tablet Take 1 tablet (81 mg total) by mouth daily. 30 tablet 2  . atorvastatin (LIPITOR) 80 MG tablet Take 1 tablet (80 mg total) by mouth every evening. 90 tablet 3  . citalopram (CELEXA) 20 MG tablet Take 1 tablet (20 mg total) by mouth daily. 90 tablet 0  . clopidogrel (PLAVIX) 75 MG tablet Take 1 tablet (75 mg total) by mouth daily with breakfast. 30 tablet 5  . digoxin (LANOXIN) 0.125 MG tablet Take 1 tablet (0.125 mg total) by mouth daily. 30 tablet 5  . ezetimibe (ZETIA) 10 MG tablet Take 1 tablet (10 mg total) by mouth daily. 30 tablet 5  . hydrALAZINE (APRESOLINE) 100 MG tablet Take 1 tablet (100 mg total) by mouth every 8 (eight) hours. 90 tablet 5  . isosorbide mononitrate (IMDUR) 60 MG 24 hr tablet Take 1 tablet (60 mg total) by mouth daily. 30 tablet 5  . lisinopril (PRINIVIL,ZESTRIL) 5 MG tablet Take 1 tablet (5 mg total) by mouth daily. 30 tablet 11  . nitroGLYCERIN (NITROSTAT) 0.4 MG SL tablet Place 1 tablet (0.4 mg total) under the tongue every 5 (five) minutes x 3 doses as needed for chest pain. 25 tablet 12  . potassium chloride SA (K-DUR,KLOR-CON) 20 MEQ tablet Take 2 tablets (40 mEq total) by mouth daily. 60 tablet 3  . spironolactone (ALDACTONE) 25 MG tablet Take 1 tablet (25 mg total) by mouth daily. 30 tablet 5  . torsemide (DEMADEX) 20 MG tablet Take 4 tablets (80 mg total) by mouth daily. 120 tablet 5   No current facility-administered medications for this encounter.     Allergies  Allergen Reactions  . Entresto [Sacubitril-Valsartan] Nausea Only and Other (See Comments)    Dizziness and "sick on stomach"      Social History   Socioeconomic History  . Marital status: Widowed    Spouse name: Not on file  . Number of children: 1  . Years of education: 9th  . Highest education level: Not on file  Occupational History  . Occupation:  not working.   Social Needs  . Financial resource strain: Not very hard  . Food insecurity:    Worry: Never true    Inability: Never true  . Transportation needs:    Medical: No    Non-medical: No  Tobacco Use  . Smoking status: Former Smoker    Packs/day: 0.50  Years: 30.00    Pack years: 15.00    Types: Cigarettes    Last attempt to quit: 05/04/2018    Years since quitting: 0.1  . Smokeless tobacco: Never Used  Substance and Sexual Activity  . Alcohol use: Not Currently    Alcohol/week: 5.0 standard drinks    Types: 5 Cans of beer per week    Comment: occasionally  . Drug use: Yes    Types: Marijuana  . Sexual activity: Yes  Lifestyle  . Physical activity:    Days per week: Not on file    Minutes per session: Not on file  . Stress: Not on file  Relationships  . Social connections:    Talks on phone: Not on file    Gets together: Not on file    Attends religious service: Not on file    Active member of club or organization: Not on file    Attends meetings of clubs or organizations: Not on file    Relationship status: Not on file  . Intimate partner violence:    Fear of current or ex partner: Not on file    Emotionally abused: Not on file    Physically abused: Not on file    Forced sexual activity: Not on file  Other Topics Concern  . Not on file  Social History Narrative   Pt is widowed and lives alone.   Patient is right handed   Patient drinks about 2cups of caffeine daily.         Family History  Problem Relation Age of Onset  . Asthma Mother   . Diabetes Mother   . Hyperlipidemia Mother   . Hypertension Mother   . Lung cancer Father   . Cancer Father   . Stroke Sister     Vitals:   07/08/18 0935  BP: 92/66  Pulse: 78  SpO2: 97%  Weight: 80.7 kg (178 lb)   Wt Readings from Last 3 Encounters:  07/08/18 80.7 kg (178 lb)  07/06/18 78.9 kg (174 lb)  07/03/18 76.6 kg (168 lb 14.4 oz)     PHYSICAL EXAM: General: No resp difficulty.  Walked into clinic without difficulty HEENT: Normal Neck: Supple. JVP 5-6. Carotids 2+ bilat; no bruits. No thyromegaly or nodule noted. Cor: PMI nondisplaced. RRR, No M/G/R noted Lungs: CTAB, normal effort. Abdomen: Soft, non-tender, non-distended, no HSM. No bruits or masses. +BS  Extremities: No cyanosis, clubbing, or rash. R and LLE no edema.  Neuro: Alert & orientedx3, cranial nerves grossly intact. moves all 4 extremities w/o difficulty. Affect pleasant   ASSESSMENT & PLAN:  1. Chronic systolic HF Echo in 2017 showed EF 55% - Echo 03/2017: EF 30-35% with grade 2 DD. ECHO in November down to 15%. Echo 06/2018 EF 20-25% NYHA II-III. Volume status stable.  - Continue torsemide 80 mg daily + 40 meq K daily. He would like to avoid BID dosing if possible. BMET today - No BB with low output.  - Continue digoxin 0.125 mg daily.  - Continue 25 mg spironolactone daily - Continue 5 mg lisinopril daily. No BP room to increase. - Continue hydralazine 100 mg three times a day + imdur 60 mg daily.  - He has echo scheduled for March. Hopefully EF will continue to improve. Discussed possibility of ICD if EF remains low. - Continue HF paramedicine.  - Refuses cardiac rehab. - Reinforced importance of limiting fluid and salt intake. He has been doing much better with this. Discussed importance of staying  on his medications.   2. CAD s/p CABG 2010 -LHC7/2/19 showed patent LIMA to LAD, SVG to RCA occluded, SVG to OM-2 with 95% prox lesion. Has unprotected AV-groove LCX which feeds collaterals to distal RCA.  - 11/20 S/P DES to left main.  - Plan for dual antiplatelet therapy with asa mg daily + plavix for minimum of 12 months.  - On ASA, plavix, statin, zetia - no bb for now with recent low output.  - Refuses cardiac rehab. No s/s ischemia.   3. Hx of Tobacco use - Quit smoking in November. No change.  4. HTN - Well controlled with HF meds.  5. CKD - Baseline ~1.2-1.4 - BMET today.  6.  ?OSA - He snores heavily. - Referred for sleep study last appointment. Has not yet heard from them.   7. Alcohol abuse - Has only drank 4 beers since Thanksgiving. Congratulated him.   8. Carotid Stenosis Carotid US 04/14/16: Right ICA 80-99%, Left ICA upper range <50% vs low range >50%. Carotid U/S completed 04/2018 . This suggested total occlusion R ICA and 50-75% LICA. He did not require surgical intervention but will continue statin and follow up with VVS in 1 year. No change.  9. LLL nodule - Noted on CT during admission. Will need follow up CT vs PET/CT in 3 months (April 2020). We discussed briefly. He would not want to travel far for PET scan. He would like to discuss further at his appointment with Dr [name] in March.   He looks great today. Continue HF paramedicine support. No BP room for med changes. Follow up in 3-4 weeks on APP side. He has an echo with Dr [name] in March.  Alford Highland, NP 07/08/18   Greater than 50% of the 25 minute visit was spent in counseling/coordination of care regarding disease state education, salt/fluid restriction, sliding scale diuretics, and medication compliance.

## 2018-07-08 ENCOUNTER — Other Ambulatory Visit: Payer: Self-pay

## 2018-07-08 ENCOUNTER — Encounter (HOSPITAL_COMMUNITY): Payer: Self-pay

## 2018-07-08 ENCOUNTER — Ambulatory Visit (HOSPITAL_COMMUNITY)
Admission: RE | Admit: 2018-07-08 | Discharge: 2018-07-08 | Disposition: A | Discharge status: Home / Self Care | Payer: Medicaid Other | Source: Ambulatory Visit | Attending: Internal Medicine | Admitting: Internal Medicine

## 2018-07-08 VITALS — BP 92/66 | HR 78 | Wt 178.0 lb

## 2018-07-08 DIAGNOSIS — I739 Peripheral vascular disease, unspecified: Secondary | ICD-10-CM | POA: Diagnosis not present

## 2018-07-08 DIAGNOSIS — F1010 Alcohol abuse, uncomplicated: Secondary | ICD-10-CM | POA: Diagnosis not present

## 2018-07-08 DIAGNOSIS — E785 Hyperlipidemia, unspecified: Secondary | ICD-10-CM | POA: Diagnosis not present

## 2018-07-08 DIAGNOSIS — K449 Diaphragmatic hernia without obstruction or gangrene: Secondary | ICD-10-CM | POA: Diagnosis not present

## 2018-07-08 DIAGNOSIS — Z951 Presence of aortocoronary bypass graft: Secondary | ICD-10-CM | POA: Diagnosis not present

## 2018-07-08 DIAGNOSIS — I071 Rheumatic tricuspid insufficiency: Secondary | ICD-10-CM | POA: Insufficient documentation

## 2018-07-08 DIAGNOSIS — Z8673 Personal history of transient ischemic attack (TIA), and cerebral infarction without residual deficits: Secondary | ICD-10-CM | POA: Insufficient documentation

## 2018-07-08 DIAGNOSIS — N189 Chronic kidney disease, unspecified: Secondary | ICD-10-CM | POA: Diagnosis not present

## 2018-07-08 DIAGNOSIS — F419 Anxiety disorder, unspecified: Secondary | ICD-10-CM | POA: Insufficient documentation

## 2018-07-08 DIAGNOSIS — R0683 Snoring: Secondary | ICD-10-CM

## 2018-07-08 DIAGNOSIS — Z8249 Family history of ischemic heart disease and other diseases of the circulatory system: Secondary | ICD-10-CM | POA: Insufficient documentation

## 2018-07-08 DIAGNOSIS — I252 Old myocardial infarction: Secondary | ICD-10-CM | POA: Insufficient documentation

## 2018-07-08 DIAGNOSIS — Z955 Presence of coronary angioplasty implant and graft: Secondary | ICD-10-CM | POA: Diagnosis not present

## 2018-07-08 DIAGNOSIS — I2510 Atherosclerotic heart disease of native coronary artery without angina pectoris: Secondary | ICD-10-CM

## 2018-07-08 DIAGNOSIS — Z7902 Long term (current) use of antithrombotics/antiplatelets: Secondary | ICD-10-CM | POA: Diagnosis not present

## 2018-07-08 DIAGNOSIS — N183 Chronic kidney disease, stage 3 unspecified: Secondary | ICD-10-CM

## 2018-07-08 DIAGNOSIS — I700 Atherosclerosis of aorta: Secondary | ICD-10-CM | POA: Diagnosis not present

## 2018-07-08 DIAGNOSIS — I130 Hypertensive heart and chronic kidney disease with heart failure and stage 1 through stage 4 chronic kidney disease, or unspecified chronic kidney disease: Secondary | ICD-10-CM | POA: Diagnosis present

## 2018-07-08 DIAGNOSIS — Z7982 Long term (current) use of aspirin: Secondary | ICD-10-CM | POA: Insufficient documentation

## 2018-07-08 DIAGNOSIS — Z87891 Personal history of nicotine dependence: Secondary | ICD-10-CM | POA: Insufficient documentation

## 2018-07-08 DIAGNOSIS — Z79899 Other long term (current) drug therapy: Secondary | ICD-10-CM | POA: Insufficient documentation

## 2018-07-08 DIAGNOSIS — F1910 Other psychoactive substance abuse, uncomplicated: Secondary | ICD-10-CM | POA: Insufficient documentation

## 2018-07-08 DIAGNOSIS — I5042 Chronic combined systolic (congestive) and diastolic (congestive) heart failure: Secondary | ICD-10-CM | POA: Diagnosis present

## 2018-07-08 DIAGNOSIS — Z888 Allergy status to other drugs, medicaments and biological substances status: Secondary | ICD-10-CM | POA: Insufficient documentation

## 2018-07-08 DIAGNOSIS — I714 Abdominal aortic aneurysm, without rupture: Secondary | ICD-10-CM | POA: Insufficient documentation

## 2018-07-08 DIAGNOSIS — I5022 Chronic systolic (congestive) heart failure: Secondary | ICD-10-CM | POA: Diagnosis not present

## 2018-07-08 DIAGNOSIS — I6523 Occlusion and stenosis of bilateral carotid arteries: Secondary | ICD-10-CM | POA: Insufficient documentation

## 2018-07-08 DIAGNOSIS — Z833 Family history of diabetes mellitus: Secondary | ICD-10-CM | POA: Insufficient documentation

## 2018-07-08 DIAGNOSIS — R918 Other nonspecific abnormal finding of lung field: Secondary | ICD-10-CM | POA: Insufficient documentation

## 2018-07-08 LAB — BASIC METABOLIC PANEL
Anion gap: 8 (ref 5–15)
BUN: 15 mg/dL (ref 6–20)
CO2: 26 mmol/L (ref 22–32)
Calcium: 8.8 mg/dL — ABNORMAL LOW (ref 8.9–10.3)
Chloride: 98 mmol/L (ref 98–111)
Creatinine, Ser: 1.61 mg/dL — ABNORMAL HIGH (ref 0.61–1.24)
GFR calc Af Amer: 53 mL/min — ABNORMAL LOW (ref >60)
GFR calc non Af Amer: 46 mL/min — ABNORMAL LOW (ref >60)
Glucose, Bld: 78 mg/dL (ref 70–99)
Potassium: 4 mmol/L (ref 3.5–5.1)
Sodium: 132 mmol/L — ABNORMAL LOW (ref 135–145)

## 2018-07-08 NOTE — Patient Instructions (Addendum)
Labs done today  Follow up with the Advanced Practice Providers in 3-4 weeks

## 2018-07-13 ENCOUNTER — Other Ambulatory Visit: Payer: Self-pay

## 2018-07-13 ENCOUNTER — Encounter: Payer: Self-pay | Admitting: Student in an Organized Health Care Education/Training Program

## 2018-07-13 ENCOUNTER — Ambulatory Visit: Payer: Medicaid Other | Admitting: Student in an Organized Health Care Education/Training Program

## 2018-07-13 ENCOUNTER — Other Ambulatory Visit (HOSPITAL_COMMUNITY): Payer: Self-pay

## 2018-07-13 VITALS — BP 140/82 | HR 94 | Temp 98.5°F | Ht 71.0 in | Wt 176.8 lb

## 2018-07-13 DIAGNOSIS — I5020 Unspecified systolic (congestive) heart failure: Secondary | ICD-10-CM | POA: Diagnosis not present

## 2018-07-13 NOTE — Progress Notes (Addendum)
Paramedicine Encounter    Patient ID: Randy [name], male    DOB: [birth year], 58 y.o.   MRN: [id number]   Patient Care Team: Howard Pouch, MD as PCP - General Bensimhon, Bevelyn Buckles, MD as PCP - Cardiology (Cardiology) Deborha Payment as Physician Assistant (Cardiology) Marvel Plan, MD as Consulting Physician (Neurology) Burna Sis, LCSW as Social Worker (Licensed Clinical Social Worker)  Patient Active Problem List   Diagnosis Date Noted  . Acute on chronic systolic heart failure (HCC) 07/01/2018  . Chronic kidney disease (CKD), stage III (moderate) (HCC) 07/01/2018  . CAD (coronary atherosclerotic disease) 07/01/2018  . H/O ETOH abuse 07/01/2018  . Acute on chronic congestive heart failure (HCC)   . NSTEMI (non-ST elevated myocardial infarction) (HCC) 05/12/2018  . Acute on chronic systolic (congestive) heart failure (HCC)   . Carotid stenosis, right   . Acute kidney injury superimposed on CKD (HCC)   . History of tobacco abuse 10/23/2017  . Hyperkalemia 09/18/2017  . Systolic heart failure (HCC) 07/08/2017  . Acute congestive heart failure (HCC)   . Dyspnea 03/23/2017  . Cellulitis of right ankle 01/23/2017  . Muscle spasm 10/23/2016  . Insomnia 04/11/2016  . Orthopnea 03/31/2016  . 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 HTN (hypertension) 06/15/2014  . HLD (hyperlipidemia) 04/05/2014  . PVD (peripheral vascular disease) with claudication (HCC) 05/09/2013  . GERD (gastroesophageal reflux disease) 03/28/2013  . AAA (abdominal aortic aneurysm) without rupture (HCC) 10/28/2012  . Anxiety state 02/12/2012  . Occlusion and stenosis of carotid artery without mention of cerebral infarction 01/19/2012  . Pulmonary nodule 05/19/2011  . CAD, ARTERY BYPASS GRAFT 10/19/2008  . DISC DISEASE, LUMBAR 10/19/2008    Current Outpatient Medications:  .  aspirin EC 81 MG tablet, Take 1 tablet (81 mg total) by mouth daily., Disp: 30 tablet,  Rfl: 2 .  atorvastatin (LIPITOR) 80 MG tablet, Take 1 tablet (80 mg total) by mouth every evening., Disp: 90 tablet, Rfl: 3 .  citalopram (CELEXA) 20 MG tablet, Take 1 tablet (20 mg total) by mouth daily., Disp: 90 tablet, Rfl: 0 .  clopidogrel (PLAVIX) 75 MG tablet, Take 1 tablet (75 mg total) by mouth daily with breakfast., Disp: 30 tablet, Rfl: 5 .  digoxin (LANOXIN) 0.125 MG tablet, Take 1 tablet (0.125 mg total) by mouth daily., Disp: 30 tablet, Rfl: 5 .  ezetimibe (ZETIA) 10 MG tablet, Take 1 tablet (10 mg total) by mouth daily., Disp: 30 tablet, Rfl: 5 .  hydrALAZINE (APRESOLINE) 100 MG tablet, Take 1 tablet (100 mg total) by mouth every 8 (eight) hours., Disp: 90 tablet, Rfl: 5 .  isosorbide mononitrate (IMDUR) 60 MG 24 hr tablet, Take 1 tablet (60 mg total) by mouth daily., Disp: 30 tablet, Rfl: 5 .  lisinopril (PRINIVIL,ZESTRIL) 5 MG tablet, Take 1 tablet (5 mg total) by mouth daily., Disp: 30 tablet, Rfl: 11 .  potassium chloride SA (K-DUR,KLOR-CON) 20 MEQ tablet, Take 2 tablets (40 mEq total) by mouth daily., Disp: 60 tablet, Rfl: 3 .  spironolactone (ALDACTONE) 25 MG tablet, Take 1 tablet (25 mg total) by mouth daily., Disp: 30 tablet, Rfl: 5 .  torsemide (DEMADEX) 20 MG tablet, Take 4 tablets (80 mg total) by mouth daily., Disp: 120 tablet, Rfl: 5 .  nitroGLYCERIN (NITROSTAT) 0.4 MG SL tablet, Place 1 tablet (0.4 mg total) under the tongue every 5 (five) minutes x 3 doses as needed for chest pain. (Patient not taking: Reported on 07/13/2018),  Disp: 25 tablet, Rfl: 12 Allergies  Allergen Reactions  . Entresto [Sacubitril-Valsartan] Nausea Only and Other (See Comments)    Dizziness and "sick on stomach"      Social History   Socioeconomic History  . Marital status: Widowed    Spouse name: Not on file  . Number of children: 1  . Years of education: 9th  . Highest education level: Not on file  Occupational History  . Occupation: not working.   Social Needs  . Financial  resource strain: Not very hard  . Food insecurity:    Worry: Never true    Inability: Never true  . Transportation needs:    Medical: No    Non-medical: No  Tobacco Use  . Smoking status: Former Smoker    Packs/day: 0.50    Years: 30.00    Pack years: 15.00    Types: Cigarettes    Last attempt to quit: 05/04/2018    Years since quitting: 0.1  . Smokeless tobacco: Never Used  Substance and Sexual Activity  . Alcohol use: Not Currently    Alcohol/week: 5.0 standard drinks    Types: 5 Cans of beer per week    Comment: occasionally  . Drug use: Yes    Types: Marijuana  . Sexual activity: Yes  Lifestyle  . Physical activity:    Days per week: Not on file    Minutes per session: Not on file  . Stress: Not on file  Relationships  . Social connections:    Talks on phone: Not on file    Gets together: Not on file    Attends religious service: Not on file    Active member of club or organization: Not on file    Attends meetings of clubs or organizations: Not on file    Relationship status: Not on file  . Intimate partner violence:    Fear of current or ex partner: Not on file    Emotionally abused: Not on file    Physically abused: Not on file    Forced sexual activity: Not on file  Other Topics Concern  . Not on file  Social History Narrative   Pt is widowed and lives alone.   Patient is right handed   Patient drinks about 2cups of caffeine daily.       Physical Exam      Future Appointments  Date Time Provider Department Center  07/29/2018 10:00 AM [facility] PA/NP [facility] None  09/21/2018  9:00 AM [facility] ECHO 1-BUZZ [facility]  09/21/2018 10:20 AM Bensimhon, Bevelyn Buckles, MD [facility] None    BP (!) 150/100   Pulse 94   Resp 15   Wt 168 lb (76.2 kg)   SpO2 98%   BMI 23.43 kg/m   Weight yesterday-169 Last visit weight-178 @ clinic   Pt states he is feeling much better, his weight is maintaining stable. He reports drinking less than than a 6 pack of beer a week, I  advised him to cut back more if at all possible.  He is snacking more on sugary snacks than salty. However he did mention eating a lot of the [employer] frozen chicken meals--advised him that could contain a bunch of sodium.  Sounds like he is keeping his fluid intake to less than 2L.  He denies any sob, no dizziness, no h/a, no cp.  He has been taking his lasix instead of his torsemide due to him being out for past 2-3 days of the same dosage.  Refills UU:[id number], potassium, torsemide, and lisinopril.  His b/p is elevated, he is due for his mid dose of hydralazine, he said he will take it once he returns home.   Kerry Hough, EMT-Paramedic [phone] [facility] Paramedic  07/13/18

## 2018-07-14 NOTE — Progress Notes (Signed)
   CC: Hospital follow up  HPI: Randy [name] is a 58 y.o. male   Patient was recently hospitalized on heart failure service.  He had a heart failure exacerbation.  He was discharged with aspirin, Plavix, dig, spiral, lisinopril, hydral-, Imdur.  He reports that since hospital discharge he has not had any difficulty breathing.  His weights have been stable.  His discharge rate was 168 and continues to be 168 on our scale today.  He reports 100% medication compliance.  He continues to follow with heart failure as an outpatient.  Patient's heart failure is thought to be secondary to alcohol abuse.  He reports that he is cut back on his alcohol use, though he continues to drink socially.  He drinks in moderation with reportedly 1 beer about 5 times per week.  He does not binge drink anymore.  He quit smoking last year.  Review of Symptoms:  See HPI for ROS.   CC, SH/smoking status, and VS noted.  Objective: BP 140/82   Pulse 94   Temp 98.5 F (36.9 C) (Axillary)   Ht 5\' 11"  (1.803 m)   Wt 176 lb 12.8 oz (80.2 kg)   SpO2 97%   BMI 24.66 kg/m  GEN: NAD, alert, cooperative, and pleasant. RESPIRATORY: Comfortable work of breathing, speaks in full sentences, CTA throughout CV: RRR, no m/r/g, no peripheral edema GI: Soft, nondistended SKIN: warm and dry, no rashes or lesions NEURO: II-XII grossly intact MSK: Moves 4 extremities equally PSYCH: AAOx3, appropriate affect  Assessment and plan:  Hospital follow up heart failure exacerbation: Patient actually had a follow-up visit from his hospital stay with the heart failure clinic on 1/9.  He continues to have stable weights.  He continues to be breathing comfortably on room air.  His blood pressures controlled today.  He has all of his medications and is reporting 100% compliance.  Continue current management.  Encouraged patient to continue to cut back on drinking and continue to abstain from smoking.  Howard Pouch, MD,MS,  PGY3 07/14/2018  4:38 PM

## 2018-07-20 ENCOUNTER — Other Ambulatory Visit (HOSPITAL_COMMUNITY): Payer: Self-pay

## 2018-07-20 ENCOUNTER — Telehealth (HOSPITAL_COMMUNITY): Payer: Self-pay

## 2018-07-20 NOTE — Telephone Encounter (Signed)
Attempted to call patient in regards to Cardiac Rehab - LM on VM 

## 2018-07-20 NOTE — Progress Notes (Signed)
Paramedicine Encounter    Patient ID: Randy [name], male    DOB: [birth year], 58 y.o.   MRN: [id number]   Patient Care Team: Howard Pouch, MD as PCP - General Bensimhon, Bevelyn Buckles, MD as PCP - Cardiology (Cardiology) Deborha Payment as Physician Assistant (Cardiology) Marvel Plan, MD as Consulting Physician (Neurology) Burna Sis, LCSW as Social Worker (Licensed Clinical Social Worker)  Patient Active Problem List   Diagnosis Date Noted  . Acute on chronic systolic heart failure (HCC) 07/01/2018  . Chronic kidney disease (CKD), stage III (moderate) (HCC) 07/01/2018  . CAD (coronary atherosclerotic disease) 07/01/2018  . H/O ETOH abuse 07/01/2018  . Acute on chronic congestive heart failure (HCC)   . NSTEMI (non-ST elevated myocardial infarction) (HCC) 05/12/2018  . Acute on chronic systolic (congestive) heart failure (HCC)   . Carotid stenosis, right   . Acute kidney injury superimposed on CKD (HCC)   . History of tobacco abuse 10/23/2017  . Hyperkalemia 09/18/2017  . Systolic heart failure (HCC) 07/08/2017  . Acute congestive heart failure (HCC)   . Dyspnea 03/23/2017  . Cellulitis of right ankle 01/23/2017  . Muscle spasm 10/23/2016  . Insomnia 04/11/2016  . Orthopnea 03/31/2016  . 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 HTN (hypertension) 06/15/2014  . HLD (hyperlipidemia) 04/05/2014  . PVD (peripheral vascular disease) with claudication (HCC) 05/09/2013  . GERD (gastroesophageal reflux disease) 03/28/2013  . AAA (abdominal aortic aneurysm) without rupture (HCC) 10/28/2012  . Anxiety state 02/12/2012  . Occlusion and stenosis of carotid artery without mention of cerebral infarction 01/19/2012  . Pulmonary nodule 05/19/2011  . CAD, ARTERY BYPASS GRAFT 10/19/2008  . DISC DISEASE, LUMBAR 10/19/2008    Current Outpatient Medications:  .  aspirin EC 81 MG tablet, Take 1 tablet (81 mg total) by mouth daily., Disp: 30 tablet,  Rfl: 2 .  atorvastatin (LIPITOR) 80 MG tablet, Take 1 tablet (80 mg total) by mouth every evening., Disp: 90 tablet, Rfl: 3 .  citalopram (CELEXA) 20 MG tablet, Take 1 tablet (20 mg total) by mouth daily., Disp: 90 tablet, Rfl: 0 .  clopidogrel (PLAVIX) 75 MG tablet, Take 1 tablet (75 mg total) by mouth daily with breakfast., Disp: 30 tablet, Rfl: 5 .  digoxin (LANOXIN) 0.125 MG tablet, Take 1 tablet (0.125 mg total) by mouth daily., Disp: 30 tablet, Rfl: 5 .  ezetimibe (ZETIA) 10 MG tablet, Take 1 tablet (10 mg total) by mouth daily., Disp: 30 tablet, Rfl: 5 .  hydrALAZINE (APRESOLINE) 100 MG tablet, Take 1 tablet (100 mg total) by mouth every 8 (eight) hours., Disp: 90 tablet, Rfl: 5 .  isosorbide mononitrate (IMDUR) 60 MG 24 hr tablet, Take 1 tablet (60 mg total) by mouth daily., Disp: 30 tablet, Rfl: 5 .  lisinopril (PRINIVIL,ZESTRIL) 5 MG tablet, Take 1 tablet (5 mg total) by mouth daily., Disp: 30 tablet, Rfl: 11 .  potassium chloride SA (K-DUR,KLOR-CON) 20 MEQ tablet, Take 2 tablets (40 mEq total) by mouth daily., Disp: 60 tablet, Rfl: 3 .  spironolactone (ALDACTONE) 25 MG tablet, Take 1 tablet (25 mg total) by mouth daily., Disp: 30 tablet, Rfl: 5 .  torsemide (DEMADEX) 20 MG tablet, Take 4 tablets (80 mg total) by mouth daily., Disp: 120 tablet, Rfl: 5 .  nitroGLYCERIN (NITROSTAT) 0.4 MG SL tablet, Place 1 tablet (0.4 mg total) under the tongue every 5 (five) minutes x 3 doses as needed for chest pain. (Patient not taking: Reported on 07/13/2018),  Disp: 25 tablet, Rfl: 12 Allergies  Allergen Reactions  . Entresto [Sacubitril-Valsartan] Nausea Only and Other (See Comments)    Dizziness and "sick on stomach"      Social History   Socioeconomic History  . Marital status: Widowed    Spouse name: Not on file  . Number of children: 1  . Years of education: 9th  . Highest education level: Not on file  Occupational History  . Occupation: not working.   Social Needs  . Financial  resource strain: Not very hard  . Food insecurity:    Worry: Never true    Inability: Never true  . Transportation needs:    Medical: No    Non-medical: No  Tobacco Use  . Smoking status: Former Smoker    Packs/day: 0.50    Years: 30.00    Pack years: 15.00    Types: Cigarettes    Last attempt to quit: 05/04/2018    Years since quitting: 0.2  . Smokeless tobacco: Never Used  Substance and Sexual Activity  . Alcohol use: Not Currently    Alcohol/week: 5.0 standard drinks    Types: 5 Cans of beer per week    Comment: occasionally  . Drug use: Yes    Types: Marijuana  . Sexual activity: Yes  Lifestyle  . Physical activity:    Days per week: Not on file    Minutes per session: Not on file  . Stress: Not on file  Relationships  . Social connections:    Talks on phone: Not on file    Gets together: Not on file    Attends religious service: Not on file    Active member of club or organization: Not on file    Attends meetings of clubs or organizations: Not on file    Relationship status: Not on file  . Intimate partner violence:    Fear of current or ex partner: Not on file    Emotionally abused: Not on file    Physically abused: Not on file    Forced sexual activity: Not on file  Other Topics Concern  . Not on file  Social History Narrative   Pt is widowed and lives alone.   Patient is right handed   Patient drinks about 2cups of caffeine daily.       Physical Exam      Future Appointments  Date Time Provider Department Center  07/29/2018 10:00 AM [facility] PA/NP [facility] None  09/21/2018  9:00 AM [facility] ECHO 1-BUZZ [facility]  09/21/2018 10:20 AM Bensimhon, Bevelyn Buckles, MD [facility] None    BP 112/78   Pulse 72   Resp 14   Wt 170 lb (77.1 kg)   SpO2 98%   BMI 23.71 kg/m   Weight yesterday-170 Last visit weight-168  Pt reports that he is feeling well today, he reports his breathing is great. He is spacing out his meds to avoid side effects from taking them  altogether. Pt has no swelling to his legs.  He is monitoring his food intake and trying to limit his sodium, he is cutting back on his snacks. However he does eat a small bag of doritos with his burger and sandwiches. He eats banana sandwiches, grills burgers, tomato sandwiches. He picked up all his meds last week. Pt sometimes has missed a couple of his mid dose hydralazine, maybe 2 a week if that. He took his last dose in his pill box today and fills his own pill box. He knows  the consequence of skipping his meds, he does not want to end up back in the hosp like last time.  Denies any issues sleeping.   Kerry Hough, EMT-Paramedic [phone] [facility] Paramedic  07/20/18

## 2018-07-26 ENCOUNTER — Telehealth (HOSPITAL_COMMUNITY): Payer: Self-pay

## 2018-07-26 NOTE — Telephone Encounter (Signed)
Phone call this week to pt, he reports he is feeling great. No complaints. He is taking all his meds without issues.  I will make home visit next week for f/u.   Kerry Hough, EMT-Paramedic  07/26/18

## 2018-07-29 ENCOUNTER — Other Ambulatory Visit: Payer: Self-pay

## 2018-07-29 ENCOUNTER — Encounter (HOSPITAL_COMMUNITY): Payer: Self-pay

## 2018-07-29 ENCOUNTER — Other Ambulatory Visit (HOSPITAL_COMMUNITY): Payer: Self-pay

## 2018-07-29 ENCOUNTER — Ambulatory Visit (HOSPITAL_COMMUNITY)
Admission: RE | Admit: 2018-07-29 | Discharge: 2018-07-29 | Disposition: A | Discharge status: Home / Self Care | Payer: Medicaid Other | Source: Ambulatory Visit | Attending: Cardiology | Admitting: Cardiology

## 2018-07-29 VITALS — BP 152/96 | HR 82 | Wt 175.4 lb

## 2018-07-29 DIAGNOSIS — E785 Hyperlipidemia, unspecified: Secondary | ICD-10-CM | POA: Insufficient documentation

## 2018-07-29 DIAGNOSIS — I739 Peripheral vascular disease, unspecified: Secondary | ICD-10-CM | POA: Insufficient documentation

## 2018-07-29 DIAGNOSIS — Z8249 Family history of ischemic heart disease and other diseases of the circulatory system: Secondary | ICD-10-CM | POA: Diagnosis not present

## 2018-07-29 DIAGNOSIS — Z951 Presence of aortocoronary bypass graft: Secondary | ICD-10-CM | POA: Diagnosis not present

## 2018-07-29 DIAGNOSIS — Z7982 Long term (current) use of aspirin: Secondary | ICD-10-CM | POA: Diagnosis not present

## 2018-07-29 DIAGNOSIS — F1010 Alcohol abuse, uncomplicated: Secondary | ICD-10-CM | POA: Diagnosis not present

## 2018-07-29 DIAGNOSIS — Z7902 Long term (current) use of antithrombotics/antiplatelets: Secondary | ICD-10-CM | POA: Insufficient documentation

## 2018-07-29 DIAGNOSIS — Z825 Family history of asthma and other chronic lower respiratory diseases: Secondary | ICD-10-CM | POA: Insufficient documentation

## 2018-07-29 DIAGNOSIS — I2510 Atherosclerotic heart disease of native coronary artery without angina pectoris: Secondary | ICD-10-CM | POA: Diagnosis not present

## 2018-07-29 DIAGNOSIS — R0683 Snoring: Secondary | ICD-10-CM | POA: Diagnosis not present

## 2018-07-29 DIAGNOSIS — I5040 Unspecified combined systolic (congestive) and diastolic (congestive) heart failure: Secondary | ICD-10-CM | POA: Diagnosis present

## 2018-07-29 DIAGNOSIS — Z888 Allergy status to other drugs, medicaments and biological substances status: Secondary | ICD-10-CM | POA: Insufficient documentation

## 2018-07-29 DIAGNOSIS — I130 Hypertensive heart and chronic kidney disease with heart failure and stage 1 through stage 4 chronic kidney disease, or unspecified chronic kidney disease: Secondary | ICD-10-CM | POA: Diagnosis not present

## 2018-07-29 DIAGNOSIS — I5022 Chronic systolic (congestive) heart failure: Secondary | ICD-10-CM | POA: Insufficient documentation

## 2018-07-29 DIAGNOSIS — N183 Chronic kidney disease, stage 3 unspecified: Secondary | ICD-10-CM

## 2018-07-29 DIAGNOSIS — I6523 Occlusion and stenosis of bilateral carotid arteries: Secondary | ICD-10-CM | POA: Insufficient documentation

## 2018-07-29 DIAGNOSIS — Z833 Family history of diabetes mellitus: Secondary | ICD-10-CM | POA: Insufficient documentation

## 2018-07-29 DIAGNOSIS — I252 Old myocardial infarction: Secondary | ICD-10-CM | POA: Diagnosis not present

## 2018-07-29 DIAGNOSIS — Z87891 Personal history of nicotine dependence: Secondary | ICD-10-CM | POA: Insufficient documentation

## 2018-07-29 DIAGNOSIS — Z955 Presence of coronary angioplasty implant and graft: Secondary | ICD-10-CM | POA: Diagnosis not present

## 2018-07-29 DIAGNOSIS — Z79899 Other long term (current) drug therapy: Secondary | ICD-10-CM | POA: Diagnosis not present

## 2018-07-29 DIAGNOSIS — K219 Gastro-esophageal reflux disease without esophagitis: Secondary | ICD-10-CM | POA: Insufficient documentation

## 2018-07-29 DIAGNOSIS — Z823 Family history of stroke: Secondary | ICD-10-CM | POA: Insufficient documentation

## 2018-07-29 DIAGNOSIS — F419 Anxiety disorder, unspecified: Secondary | ICD-10-CM | POA: Insufficient documentation

## 2018-07-29 DIAGNOSIS — R911 Solitary pulmonary nodule: Secondary | ICD-10-CM | POA: Insufficient documentation

## 2018-07-29 DIAGNOSIS — Z8673 Personal history of transient ischemic attack (TIA), and cerebral infarction without residual deficits: Secondary | ICD-10-CM | POA: Insufficient documentation

## 2018-07-29 MED ORDER — CARVEDILOL 3.125 MG PO TABS: 3.1250 mg | ORAL_TABLET | Freq: Two times a day (BID) | ORAL | 11 refills | Status: DC

## 2018-07-29 NOTE — Progress Notes (Signed)
Paramedicine Encounter   Patient ID: Randy [name] , male,   DOB: 08/28/1960,57 y.o.,  MRN: [id number]   Met patient in clinic today with provider.  Pt is doing great, he reports feeling good, taking all meds.  He staggers his meds out to avoid any mixed reactions and feeling bad, so he took his asa and his torsemide but not his other meds yet.  He denies any issues at this time.  Amy is adding carvedilol  3.164m BID, he will come back in a month.  I will see him next week for f/u.   B/p-152/96 p-82 sp02-98 [place]

## 2018-07-29 NOTE — Patient Instructions (Signed)
START Carvedilol 3.125 mg, one tab twice a day   Your physician recommends that you schedule a follow-up appointment in: 4 weeks  in the Advanced Practitioners (PA/NP) Clinic    Do the following things EVERYDAY: 1) Weigh yourself in the morning before breakfast. Write it down and keep it in a log. 2) Take your medicines as prescribed 3) Eat low salt foods-Limit salt (sodium) to 2000 mg per day.  4) Stay as active as you can everyday 5) Limit all fluids for the day to less than 2 liters

## 2018-07-29 NOTE — Progress Notes (Signed)
Advanced Heart Failure Clinic Note   Referring Physician: Dr. Mosetta Putt PCP: Howard Pouch, MD HF Cardiologist: Dr [name] [facility]: Dr Randie Heinz   HPI: Randy [name] is a 58 y.o. male with history of combined HF (diagnosed 03/2017), HTN, HLD, CAD s/p CABG 2010 and PCI (last ~2014), former tobacco use, CVA, and AAA. He is intolerant entresto.   Admitted 03/2017 with CHF exacerbation. Echo showed EF 30-35% with grade 2 DD. Cardiology was consulted and he refused [facility] for further work up.   Quit smoking Jan 1. Smoked for ~50 years prior 1 ppd. ETOH use (Bud Light) 18 pack/week, decreased from 18-24 beers daily. Has not smoked or had alcohol since 05/12/2018.   He was initially seen in the HF clinic June 2019. At that time he was set up for heart cath and switched to entresto. He was not on spiro with a history of hyperkalemia. He underwent LHC/RHC in July. He did not require intervention. Reduced EF was thought to be related to ETOH abuse.   Admitted to [facility] 05/12/2018 with increased dyspnea and chest pain. Elevated troponin was thought to be in the setting of demand ischemia. He has an ECHO completed that showed severely reduced EF 15%. Diuresed with IV lasix but had poor response so milrinone was added. Diuresed over 20 pounds. Once adequately diuresed interventional cardiology took him to the cath lab and he underwent PCI DES to left main. After heart catherization, milrinone was gradually weaned off. He will continue on aspirin and plavix for 1 year.  He was not on bb due to low output. He was not on entresto due to intolerance and not on an ace due to elevated K and creatinine. PICC line was removed prior to discharge. Vascular consulted and repeated carotid U/S. This suggested total occlusion R ICA and 50-75% LICA. He did not require surgical intervention but will continue statin and follow up with [facility] in 1 year. Discharge weight was 168 pounds.   Admitted 1/2-07/03/18 with volume overload after stopping  medications for 3 days. He was hypoxic on arrival to ED. He diuresed with IV lasix. Repeat echo showed EF 20-25% (improved from 15%). He had a CT of chest that showed LLL nodule with recommendations of repeat CT vs PET in 3 months. DC weight: 168 lbs.   Today he returns for HF follow up. Overall feeling much better.  Denies SOB/PND/Orthopnea. Appetite ok. No fever or chills. Weight at home 167-168  pounds. Taking all medications but he did not take all medications this morning. Drinks 6 pack of beer a week. Followed closely by HF Paramedicine.   CT Chest 07/02/2018 1. Positive for a macrolobulated 1.7 x 1.6 x 1.5 cm low-density left lower lobe pulmonary nodule. Differential considerations include hamartoma, primary bronchogenic carcinoma, and less likely (given isolated finding) metastatic disease. Consider one of the following in 3 months for both low-risk and high-risk individuals: (a) repeat chest CT, (b) follow-up PET-CT, or (c) tissue sampling. This recommendation follows the consensus statement: Guidelines for Management of Incidental Pulmonary Nodules Detected on CT Images: From the Fleischner Society 2017; Radiology 2017; 284:228-243. 2.  Aortic Atherosclerosis (ICD10-170.0) 3. Coronary artery calcifications. 4. Small hiatal hernia.  Echo 07/02/2018: - Left ventricle: The cavity size was mildly dilated. Wall   thickness was normal. Systolic function was severely reduced. The   estimated ejection fraction was in the range of 20% to 25%.   Diffuse hypokinesis. Doppler parameters are consistent with a   reversible restrictive pattern, indicative of decreased  left   ventricular diastolic compliance and/or increased left atrial   pressure (grade 3 diastolic dysfunction). - Left atrium: The atrium was severely dilated. - Right ventricle: The cavity size was moderately dilated. Wall   thickness was normal. Systolic function was moderately reduced. - Right atrium: The atrium was severely  dilated. - Pulmonary arteries: Systolic pressure was mildly increased. PA   peak pressure: 37 mm Hg (S).  ECHO 04/2018 Left ventricle:  15%. Diffuse hypokinesis. Doppler   parameters are consistent with restrictive physiology, indicative   of decreased left ventricular diastolic compliance and/or   increased left atrial pressure. Doppler parameters are consistent   with high ventricular filling pressure. - Left atrium: The atrium was severely dilated. - Right ventricle: The cavity size was mildly dilated. Systolic   function was moderately reduced. - Right atrium: The atrium was moderately dilated. - Tricuspid valve: There was moderate regurgitation. - Pulmonary arteries: Systolic pressure was mildly increased. PA   peak pressure: 44 mm Hg (S).  ECHO 12/2017  LVEF 30-35%  RA severely dilated  RV mildly dilated  Peak PA pressure 43 mm hg  LHC 04/2018   Mid LM lesion is 90% stenosed.  Prox LAD lesion is 100% stenosed.  Ost 2nd Mrg to 2nd Mrg lesion is 100% stenosed.  2nd Mrg lesion is 99% stenosed.  Prox Cx to Mid Cx lesion is 30% stenosed.  Ost RCA to Prox RCA lesion is 100% stenosed.  Mid Graft lesion is 30% stenosed.  Origin to Prox Graft lesion is 100% stenosed.  SVG.  Prox Graft lesion is 90% stenosed.  A drug-eluting stent was successfully placed using a STENT SYNERGY DES 3X16.  Post intervention, there is a 10% residual stenosis.  1.  Severe stenosis of the native left mainstem, treated successfully with PCI using a 3.0 x 16 mm Synergy DES postdilated to high pressure with a 3.25 mm noncompliant balloon 2.  Chronic occlusion of the LAD supplied by a widely patent LIMA graft 3.  Severe stenosis of the saphenous vein graft to first OM with total occlusion of the OM branch just beyond the coronary anastomosis, unchanged from the previous study 4.  Patent saphenous vein graft to first diagonal supplying collaterals to the obtuse marginal branch 5.  Known chronic  occlusion of the native RCA and saphenous vein graft RCA, not selectively injected Recommend uninterrupted dual antiplatelet therapy with Aspirin 81mg  daily and Clopidogrel 75mg  daily for a minimum of 12 months (ACS - Class I recommendation).   RHC/LHC 12/2017   Ost RCA to Prox RCA lesion is 100% stenosed.  Mid LM lesion is 80% stenosed.  Prox LAD lesion is 100% stenosed.  Ost 2nd Mrg to 2nd Mrg lesion is 100% stenosed.  2nd Mrg lesion is 99% stenosed.  Origin to Prox Graft lesion is 100% stenosed.  Prox Cx to Mid Cx lesion is 30% stenosed.  Mid Graft lesion is 30% stenosed.  Findings: RA = 4 RV = 22/5 PA = 27/10 (16) PCW = 15 Fick cardiac output/index = 5.1/2.5 FA sat = 94% PA sat = 66%, 66%  Review of systems complete and found to be negative unless listed in HPI.   Past Medical History:  Diagnosis Date  . AAA (abdominal aortic aneurysm) [facility]) July 2013  . Alcohol abuse   . Anxiety   . CAD (coronary artery disease)    a. s/p CABG 2010.  . Carotid artery occlusion    a. prior R CEA, stenting of L carotid  2014. b. Severe carotid restenosis in 2017 -> planned for surgery but pt did not f/u.  [name] Chronic combined systolic and diastolic CHF (congestive heart failure) (HCC)   . GERD (gastroesophageal reflux disease)   . Heart attack (HCC) 2010  . Hx of CABG 2010  . Hyperlipidemia   . Hypertension   . Lung nodule   . Polysubstance abuse (HCC)    a. mention of cocaine positivity in 2010 with patient stating unknowingly exposed, also EtOH.  [name] PVD (peripheral vascular disease) (HCC)    a. PAD s/p failed attempt at stenting of the left common iliac artery 2014.  . Stroke (HCC)   . Uncontrolled hypertension 02/02/2012    Current Outpatient Medications  Medication Sig Dispense Refill  . aspirin EC 81 MG tablet Take 1 tablet (81 mg total) by mouth daily. 30 tablet 2  . atorvastatin (LIPITOR) 80 MG tablet Take 1 tablet (80 mg total) by mouth every evening. 90 tablet 3  .  citalopram (CELEXA) 20 MG tablet Take 1 tablet (20 mg total) by mouth daily. 90 tablet 0  . clopidogrel (PLAVIX) 75 MG tablet Take 1 tablet (75 mg total) by mouth daily with breakfast. 30 tablet 5  . digoxin (LANOXIN) 0.125 MG tablet Take 1 tablet (0.125 mg total) by mouth daily. 30 tablet 5  . ezetimibe (ZETIA) 10 MG tablet Take 1 tablet (10 mg total) by mouth daily. 30 tablet 5  . hydrALAZINE (APRESOLINE) 100 MG tablet Take 1 tablet (100 mg total) by mouth every 8 (eight) hours. 90 tablet 5  . isosorbide mononitrate (IMDUR) 60 MG 24 hr tablet Take 1 tablet (60 mg total) by mouth daily. 30 tablet 5  . lisinopril (PRINIVIL,ZESTRIL) 5 MG tablet Take 1 tablet (5 mg total) by mouth daily. 30 tablet 11  . potassium chloride SA (K-DUR,KLOR-CON) 20 MEQ tablet Take 2 tablets (40 mEq total) by mouth daily. 60 tablet 3  . spironolactone (ALDACTONE) 25 MG tablet Take 1 tablet (25 mg total) by mouth daily. 30 tablet 5  . torsemide (DEMADEX) 20 MG tablet Take 4 tablets (80 mg total) by mouth daily. 120 tablet 5  . carvedilol (COREG) 3.125 MG tablet Take 1 tablet (3.125 mg total) by mouth 2 (two) times daily. 60 tablet 11  . nitroGLYCERIN (NITROSTAT) 0.4 MG SL tablet Place 1 tablet (0.4 mg total) under the tongue every 5 (five) minutes x 3 doses as needed for chest pain. (Patient not taking: Reported on 07/13/2018) 25 tablet 12   No current facility-administered medications for this encounter.     Allergies  Allergen Reactions  . Entresto [Sacubitril-Valsartan] Nausea Only and Other (See Comments)    Dizziness and "sick on stomach"      Social History   Socioeconomic History  . Marital status: Widowed    Spouse name: Not on file  . Number of children: 1  . Years of education: 9th  . Highest education level: Not on file  Occupational History  . Occupation: not working.   Social Needs  . Financial resource strain: Not very hard  . Food insecurity:    Worry: Never true    Inability: Never true    . Transportation needs:    Medical: No    Non-medical: No  Tobacco Use  . Smoking status: Former Smoker    Packs/day: 0.50    Years: 30.00    Pack years: 15.00    Types: Cigarettes    Last attempt to quit: 05/04/2018  Years since quitting: 0.2  . Smokeless tobacco: Never Used  Substance and Sexual Activity  . Alcohol use: Not Currently    Alcohol/week: 5.0 standard drinks    Types: 5 Cans of beer per week    Comment: occasionally  . Drug use: Yes    Types: Marijuana  . Sexual activity: Yes  Lifestyle  . Physical activity:    Days per week: Not on file    Minutes per session: Not on file  . Stress: Not on file  Relationships  . Social connections:    Talks on phone: Not on file    Gets together: Not on file    Attends religious service: Not on file    Active member of club or organization: Not on file    Attends meetings of clubs or organizations: Not on file    Relationship status: Not on file  . Intimate partner violence:    Fear of current or ex partner: Not on file    Emotionally abused: Not on file    Physically abused: Not on file    Forced sexual activity: Not on file  Other Topics Concern  . Not on file  Social History Narrative   Pt is widowed and lives alone.   Patient is right handed   Patient drinks about 2cups of caffeine daily.         Family History  Problem Relation Age of Onset  . Asthma Mother   . Diabetes Mother   . Hyperlipidemia Mother   . Hypertension Mother   . Lung cancer Father   . Cancer Father   . Stroke Sister     Vitals:   07/29/18 1005  BP: (!) 152/96  Pulse: 82  SpO2: 98%  Weight: 79.6 kg (175 lb 6.4 oz)   Wt Readings from Last 3 Encounters:  07/29/18 79.6 kg (175 lb 6.4 oz)  07/20/18 77.1 kg (170 lb)  07/13/18 80.2 kg (176 lb 12.8 oz)     PHYSICAL EXAM: General:  Well appearing. No resp difficulty HEENT: normal Neck: supple. no JVD. Carotids 2+ bilat; no bruits. No lymphadenopathy or thryomegaly  appreciated. Cor: PMI nondisplaced. Regular rate & rhythm. No rubs, gallops or murmurs. Lungs: clear Abdomen: soft, nontender, nondistended. No hepatosplenomegaly. No bruits or masses. Good bowel sounds. Extremities: no cyanosis, clubbing, rash, edema Neuro: alert & orientedx3, cranial nerves grossly intact. moves all 4 extremities w/o difficulty. Affect pleasant  ASSESSMENT & PLAN:  1. Chronic systolic HF Echo in 2017 showed EF 55% - Echo 03/2017: EF 30-35% with grade 2 DD. ECHO in November down to 15%. Echo 06/2018 EF 20-25% NYHA II. Volume status stable. Continue torsemide 80 mg daily + 40 meq K daily. He would like to avoid BID dosing if possible.  - Add 3.125 mg carvedilol twice a day. .  - Continue digoxin 0.125 mg daily.  - Continue 25 mg spironolactone daily - Continue 5 mg lisinopril daily. Intolerant entresto due to nausea.  - Continue hydralazine 100 mg three times a day + imdur 60 mg daily.  - He has echo scheduled for March. Hopefully EF will continue to improve. Discussed possibility of ICD if EF remains low. - Continue HF paramedicine.  - Refuses cardiac rehab.  2. CAD s/p CABG 2010 -LHC7/2/19 showed patent LIMA to LAD, SVG to RCA occluded, SVG to OM-2 with 95% prox lesion. Has unprotected AV-groove LCX which feeds collaterals to distal RCA.  - 11/20 S/P DES to left main.  - Plan  for dual antiplatelet therapy with asa mg daily + plavix for minimum of 12 months.  -No s/s ischemia.  -  On ASA, plavix, statin, zetia - Add low dose carvedilol.   - Refuses cardiac rehab.   3. Hx of Tobacco use - Quit smoking in November. No change.  4. HTN Elevated but BP at home is stable. Add carvedilol today.   5. CKD Stage III - Baseline ~1.2-1.4 - BMET next visit.   6. ?OSA - He snores heavily. - Referred for sleep study last appointment. Has not yet heard from them.   7. Alcohol abuse - Increased alcohol intake. Discussed cessation.   8. Carotid Stenosis Carotid US  04/14/16: Right ICA 80-99%, Left ICA upper range <50% vs low range >50%. Carotid U/S completed 04/2018 . This suggested total occlusion R ICA and 50-75% LICA. He did not require surgical intervention but will continue statin and follow up with [facility] in 1 year. No change.  9. LLL nodule - Noted on CT during admission. Will need follow up CT vs PET/CT in 3 months (April 2020). We discussed briefly. He would not want to travel far for PET scan. He would like to discuss further at his appointment with Dr [name] in March.   Follow up in 4 weeks. Consider increasing carvedilol at that time. Repeat ECHO in March.   Tonye Becket, NP 07/29/18

## 2018-08-04 ENCOUNTER — Other Ambulatory Visit (HOSPITAL_COMMUNITY): Payer: Self-pay

## 2018-08-04 NOTE — Progress Notes (Signed)
Paramedicine Encounter    Patient ID: Randy [name], male    DOB: 06/14/1961, 58 y.o.   MRN: [id number]   Patient Care Team: Howard Pouch, MD as PCP - General Bensimhon, Bevelyn Buckles, MD as PCP - Cardiology (Cardiology) Deborha Payment as Physician Assistant (Cardiology) Marvel Plan, MD as Consulting Physician (Neurology) Burna Sis, LCSW as Social Worker (Licensed Clinical Social Worker)  Patient Active Problem List   Diagnosis Date Noted  . Acute on chronic systolic heart failure (HCC) 07/01/2018  . Chronic kidney disease (CKD), stage III (moderate) (HCC) 07/01/2018  . CAD (coronary atherosclerotic disease) 07/01/2018  . H/O ETOH abuse 07/01/2018  . Acute on chronic congestive heart failure (HCC)   . NSTEMI (non-ST elevated myocardial infarction) (HCC) 05/12/2018  . Acute on chronic systolic (congestive) heart failure (HCC)   . Carotid stenosis, right   . Acute kidney injury superimposed on CKD (HCC)   . History of tobacco abuse 10/23/2017  . Hyperkalemia 09/18/2017  . Systolic heart failure (HCC) 07/08/2017  . Acute congestive heart failure (HCC)   . Dyspnea 03/23/2017  . Cellulitis of right ankle 01/23/2017  . Muscle spasm 10/23/2016  . Insomnia 04/11/2016  . Orthopnea 03/31/2016  . 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 HTN (hypertension) 06/15/2014  . HLD (hyperlipidemia) 04/05/2014  . PVD (peripheral vascular disease) with claudication (HCC) 05/09/2013  . GERD (gastroesophageal reflux disease) 03/28/2013  . AAA (abdominal aortic aneurysm) without rupture (HCC) 10/28/2012  . Anxiety state 02/12/2012  . Occlusion and stenosis of carotid artery without mention of cerebral infarction 01/19/2012  . Pulmonary nodule 05/19/2011  . CAD, ARTERY BYPASS GRAFT 10/19/2008  . DISC DISEASE, LUMBAR 10/19/2008    Current Outpatient Medications:  .  aspirin EC 81 MG tablet, Take 1 tablet (81 mg total) by mouth daily., Disp: 30 tablet,  Rfl: 2 .  atorvastatin (LIPITOR) 80 MG tablet, Take 1 tablet (80 mg total) by mouth every evening., Disp: 90 tablet, Rfl: 3 .  carvedilol (COREG) 3.125 MG tablet, Take 1 tablet (3.125 mg total) by mouth 2 (two) times daily., Disp: 60 tablet, Rfl: 11 .  citalopram (CELEXA) 20 MG tablet, Take 1 tablet (20 mg total) by mouth daily., Disp: 90 tablet, Rfl: 0 .  clopidogrel (PLAVIX) 75 MG tablet, Take 1 tablet (75 mg total) by mouth daily with breakfast., Disp: 30 tablet, Rfl: 5 .  digoxin (LANOXIN) 0.125 MG tablet, Take 1 tablet (0.125 mg total) by mouth daily., Disp: 30 tablet, Rfl: 5 .  ezetimibe (ZETIA) 10 MG tablet, Take 1 tablet (10 mg total) by mouth daily., Disp: 30 tablet, Rfl: 5 .  hydrALAZINE (APRESOLINE) 100 MG tablet, Take 1 tablet (100 mg total) by mouth every 8 (eight) hours., Disp: 90 tablet, Rfl: 5 .  isosorbide mononitrate (IMDUR) 60 MG 24 hr tablet, Take 1 tablet (60 mg total) by mouth daily., Disp: 30 tablet, Rfl: 5 .  lisinopril (PRINIVIL,ZESTRIL) 5 MG tablet, Take 1 tablet (5 mg total) by mouth daily., Disp: 30 tablet, Rfl: 11 .  potassium chloride SA (K-DUR,KLOR-CON) 20 MEQ tablet, Take 2 tablets (40 mEq total) by mouth daily., Disp: 60 tablet, Rfl: 3 .  spironolactone (ALDACTONE) 25 MG tablet, Take 1 tablet (25 mg total) by mouth daily., Disp: 30 tablet, Rfl: 5 .  torsemide (DEMADEX) 20 MG tablet, Take 4 tablets (80 mg total) by mouth daily., Disp: 120 tablet, Rfl: 5 .  nitroGLYCERIN (NITROSTAT) 0.4 MG SL tablet, Place 1 tablet (  0.4 mg total) under the tongue every 5 (five) minutes x 3 doses as needed for chest pain. (Patient not taking: Reported on 07/13/2018), Disp: 25 tablet, Rfl: 12 Allergies  Allergen Reactions  . Entresto [Sacubitril-Valsartan] Nausea Only and Other (See Comments)    Dizziness and "sick on stomach"      Social History   Socioeconomic History  . Marital status: Widowed    Spouse name: Not on file  . Number of children: 1  . Years of education: 9th   . Highest education level: Not on file  Occupational History  . Occupation: not working.   Social Needs  . Financial resource strain: Not very hard  . Food insecurity:    Worry: Never true    Inability: Never true  . Transportation needs:    Medical: No    Non-medical: No  Tobacco Use  . Smoking status: Former Smoker    Packs/day: 0.50    Years: 30.00    Pack years: 15.00    Types: Cigarettes    Last attempt to quit: 05/04/2018    Years since quitting: 0.2  . Smokeless tobacco: Never Used  Substance and Sexual Activity  . Alcohol use: Not Currently    Alcohol/week: 5.0 standard drinks    Types: 5 Cans of beer per week    Comment: occasionally  . Drug use: Yes    Types: Marijuana  . Sexual activity: Yes  Lifestyle  . Physical activity:    Days per week: Not on file    Minutes per session: Not on file  . Stress: Not on file  Relationships  . Social connections:    Talks on phone: Not on file    Gets together: Not on file    Attends religious service: Not on file    Active member of club or organization: Not on file    Attends meetings of clubs or organizations: Not on file    Relationship status: Not on file  . Intimate partner violence:    Fear of current or ex partner: Not on file    Emotionally abused: Not on file    Physically abused: Not on file    Forced sexual activity: Not on file  Other Topics Concern  . Not on file  Social History Narrative   Pt is widowed and lives alone.   Patient is right handed   Patient drinks about 2cups of caffeine daily.       Physical Exam      Future Appointments  Date Time Provider Department Center  08/26/2018 10:00 AM [facility] PA/NP [facility] None  09/21/2018  9:00 AM [facility]  09/21/2018 10:20 AM Bensimhon, Bevelyn Buckles, MD [facility] None    BP (!) 150/110   Pulse (!) 104   Resp 15   Wt 169 lb (76.7 kg)   SpO2 98%   BMI 23.57 kg/m   Weight yesterday-168 Last visit weight-175  Pt reports hes  doing ok, he denies any issues since the addition of carvedilol, pt denies any sob, no dizziness, no c/p, no swelling noted.  pts b/p elevated but he got home not long before I arrived and he had been helping his daughter do things around the house and he just took his meds when he got home just before I arrived. I will come back out next week for b/p check.   Kerry Hough, EMT-Paramedic [phone] [facility] Paramedic  08/04/18

## 2018-08-06 ENCOUNTER — Encounter (HOSPITAL_COMMUNITY): Payer: Self-pay

## 2018-08-06 ENCOUNTER — Telehealth (HOSPITAL_COMMUNITY): Payer: Self-pay

## 2018-08-06 NOTE — Telephone Encounter (Signed)
Attempted to call pt a 2nd time- LM ON VM ° °Mailed letter out °

## 2018-08-16 ENCOUNTER — Other Ambulatory Visit (HOSPITAL_COMMUNITY): Payer: Self-pay

## 2018-08-16 NOTE — Progress Notes (Signed)
Paramedicine Encounter    Patient ID: Randy [name], male    DOB: 06/14/1961, 58 y.o.   MRN: [id number]   Patient Care Team: Howard Pouch, MD as PCP - General Bensimhon, Bevelyn Buckles, MD as PCP - Cardiology (Cardiology) Deborha Payment as Physician Assistant (Cardiology) Marvel Plan, MD as Consulting Physician (Neurology) Burna Sis, LCSW as Social Worker (Licensed Clinical Social Worker)  Patient Active Problem List   Diagnosis Date Noted  . Acute on chronic systolic heart failure (HCC) 07/01/2018  . Chronic kidney disease (CKD), stage III (moderate) (HCC) 07/01/2018  . CAD (coronary atherosclerotic disease) 07/01/2018  . H/O ETOH abuse 07/01/2018  . Acute on chronic congestive heart failure (HCC)   . NSTEMI (non-ST elevated myocardial infarction) (HCC) 05/12/2018  . Acute on chronic systolic (congestive) heart failure (HCC)   . Carotid stenosis, right   . Acute kidney injury superimposed on CKD (HCC)   . History of tobacco abuse 10/23/2017  . Hyperkalemia 09/18/2017  . Systolic heart failure (HCC) 07/08/2017  . Acute congestive heart failure (HCC)   . Dyspnea 03/23/2017  . Cellulitis of right ankle 01/23/2017  . Muscle spasm 10/23/2016  . Insomnia 04/11/2016  . Orthopnea 03/31/2016  . 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 HTN (hypertension) 06/15/2014  . HLD (hyperlipidemia) 04/05/2014  . PVD (peripheral vascular disease) with claudication (HCC) 05/09/2013  . GERD (gastroesophageal reflux disease) 03/28/2013  . AAA (abdominal aortic aneurysm) without rupture (HCC) 10/28/2012  . Anxiety state 02/12/2012  . Occlusion and stenosis of carotid artery without mention of cerebral infarction 01/19/2012  . Pulmonary nodule 05/19/2011  . CAD, ARTERY BYPASS GRAFT 10/19/2008  . DISC DISEASE, LUMBAR 10/19/2008    Current Outpatient Medications:  .  aspirin EC 81 MG tablet, Take 1 tablet (81 mg total) by mouth daily., Disp: 30 tablet,  Rfl: 2 .  atorvastatin (LIPITOR) 80 MG tablet, Take 1 tablet (80 mg total) by mouth every evening., Disp: 90 tablet, Rfl: 3 .  carvedilol (COREG) 3.125 MG tablet, Take 1 tablet (3.125 mg total) by mouth 2 (two) times daily., Disp: 60 tablet, Rfl: 11 .  citalopram (CELEXA) 20 MG tablet, Take 1 tablet (20 mg total) by mouth daily., Disp: 90 tablet, Rfl: 0 .  clopidogrel (PLAVIX) 75 MG tablet, Take 1 tablet (75 mg total) by mouth daily with breakfast., Disp: 30 tablet, Rfl: 5 .  digoxin (LANOXIN) 0.125 MG tablet, Take 1 tablet (0.125 mg total) by mouth daily., Disp: 30 tablet, Rfl: 5 .  ezetimibe (ZETIA) 10 MG tablet, Take 1 tablet (10 mg total) by mouth daily., Disp: 30 tablet, Rfl: 5 .  hydrALAZINE (APRESOLINE) 100 MG tablet, Take 1 tablet (100 mg total) by mouth every 8 (eight) hours., Disp: 90 tablet, Rfl: 5 .  isosorbide mononitrate (IMDUR) 60 MG 24 hr tablet, Take 1 tablet (60 mg total) by mouth daily., Disp: 30 tablet, Rfl: 5 .  lisinopril (PRINIVIL,ZESTRIL) 5 MG tablet, Take 1 tablet (5 mg total) by mouth daily., Disp: 30 tablet, Rfl: 11 .  potassium chloride SA (K-DUR,KLOR-CON) 20 MEQ tablet, Take 2 tablets (40 mEq total) by mouth daily., Disp: 60 tablet, Rfl: 3 .  spironolactone (ALDACTONE) 25 MG tablet, Take 1 tablet (25 mg total) by mouth daily., Disp: 30 tablet, Rfl: 5 .  torsemide (DEMADEX) 20 MG tablet, Take 4 tablets (80 mg total) by mouth daily., Disp: 120 tablet, Rfl: 5 .  nitroGLYCERIN (NITROSTAT) 0.4 MG SL tablet, Place 1 tablet (  0.4 mg total) under the tongue every 5 (five) minutes x 3 doses as needed for chest pain. (Patient not taking: Reported on 07/13/2018), Disp: 25 tablet, Rfl: 12 Allergies  Allergen Reactions  . Entresto [Sacubitril-Valsartan] Nausea Only and Other (See Comments)    Dizziness and "sick on stomach"      Social History   Socioeconomic History  . Marital status: Widowed    Spouse name: Not on file  . Number of children: 1  . Years of education: 9th   . Highest education level: Not on file  Occupational History  . Occupation: not working.   Social Needs  . Financial resource strain: Not very hard  . Food insecurity:    Worry: Never true    Inability: Never true  . Transportation needs:    Medical: No    Non-medical: No  Tobacco Use  . Smoking status: Former Smoker    Packs/day: 0.50    Years: 30.00    Pack years: 15.00    Types: Cigarettes    Last attempt to quit: 05/04/2018    Years since quitting: 0.2  . Smokeless tobacco: Never Used  Substance and Sexual Activity  . Alcohol use: Not Currently    Alcohol/week: 5.0 standard drinks    Types: 5 Cans of beer per week    Comment: occasionally  . Drug use: Yes    Types: Marijuana  . Sexual activity: Yes  Lifestyle  . Physical activity:    Days per week: Not on file    Minutes per session: Not on file  . Stress: Not on file  Relationships  . Social connections:    Talks on phone: Not on file    Gets together: Not on file    Attends religious service: Not on file    Active member of club or organization: Not on file    Attends meetings of clubs or organizations: Not on file    Relationship status: Not on file  . Intimate partner violence:    Fear of current or ex partner: Not on file    Emotionally abused: Not on file    Physically abused: Not on file    Forced sexual activity: Not on file  Other Topics Concern  . Not on file  Social History Narrative   Pt is widowed and lives alone.   Patient is right handed   Patient drinks about 2cups of caffeine daily.       Physical Exam      Future Appointments  Date Time Provider Department Center  08/26/2018 10:00 AM [facility] PA/NP [facility] None  09/21/2018  9:00 AM [facility]  09/21/2018 10:20 AM Bensimhon, Bevelyn Buckles, MD [facility] None    BP (!) 158/102   Pulse 98   Resp 15   Wt 172 lb (78 kg)   SpO2 98%   BMI 23.99 kg/m  B/P recheck- 132/90 P-96 [id number]  Weight yesterday-172 Last visit  weight-169   Pt reports doing great, he is active-goes to see his daughter often.  Pt denies any sob, no dizziness noted. His appetite is good, he reports eating very good. His weight is increased but again he reports eating good. No swelling noted.  Managing his meds very well. Pt denies any c/p.  Pt denies missing any meds.  Vitals are elevated but pt just recently got back home from daughters and fed and let out his dogs.  After recheck about of rest his v/s--his v/s are  coming down and he is coming up on his next dose of hydralazine.  At last visit his b/p was high but same as today--he just got back home from helping his daughter do things around her house.    Kerry Hough, EMT-Paramedic [phone] [facility] Paramedic  08/16/18

## 2018-08-17 NOTE — Telephone Encounter (Signed)
Called pt to see if  He was interested in the rehab program. Pt stated that he is not interested and doesn't want to participate. Tempie Donning. Support Rep II

## 2018-08-24 ENCOUNTER — Telehealth (HOSPITAL_COMMUNITY): Payer: Self-pay

## 2018-08-24 ENCOUNTER — Other Ambulatory Visit: Payer: Self-pay

## 2018-08-24 ENCOUNTER — Ambulatory Visit (HOSPITAL_COMMUNITY)
Admission: RE | Admit: 2018-08-24 | Discharge: 2018-08-24 | Disposition: A | Discharge status: Home / Self Care | Payer: Medicaid Other | Source: Ambulatory Visit | Attending: Internal Medicine | Admitting: Internal Medicine

## 2018-08-24 ENCOUNTER — Encounter (HOSPITAL_COMMUNITY): Payer: Self-pay

## 2018-08-24 VITALS — BP 118/66 | HR 66 | Wt 177.0 lb

## 2018-08-24 DIAGNOSIS — I2510 Atherosclerotic heart disease of native coronary artery without angina pectoris: Secondary | ICD-10-CM

## 2018-08-24 DIAGNOSIS — I252 Old myocardial infarction: Secondary | ICD-10-CM | POA: Diagnosis not present

## 2018-08-24 DIAGNOSIS — I739 Peripheral vascular disease, unspecified: Secondary | ICD-10-CM | POA: Insufficient documentation

## 2018-08-24 DIAGNOSIS — Z87891 Personal history of nicotine dependence: Secondary | ICD-10-CM | POA: Insufficient documentation

## 2018-08-24 DIAGNOSIS — I130 Hypertensive heart and chronic kidney disease with heart failure and stage 1 through stage 4 chronic kidney disease, or unspecified chronic kidney disease: Secondary | ICD-10-CM | POA: Insufficient documentation

## 2018-08-24 DIAGNOSIS — Z833 Family history of diabetes mellitus: Secondary | ICD-10-CM | POA: Insufficient documentation

## 2018-08-24 DIAGNOSIS — R918 Other nonspecific abnormal finding of lung field: Secondary | ICD-10-CM | POA: Diagnosis not present

## 2018-08-24 DIAGNOSIS — Z888 Allergy status to other drugs, medicaments and biological substances status: Secondary | ICD-10-CM | POA: Diagnosis not present

## 2018-08-24 DIAGNOSIS — Z955 Presence of coronary angioplasty implant and graft: Secondary | ICD-10-CM | POA: Insufficient documentation

## 2018-08-24 DIAGNOSIS — Z79899 Other long term (current) drug therapy: Secondary | ICD-10-CM | POA: Diagnosis not present

## 2018-08-24 DIAGNOSIS — I6523 Occlusion and stenosis of bilateral carotid arteries: Secondary | ICD-10-CM | POA: Diagnosis not present

## 2018-08-24 DIAGNOSIS — I5042 Chronic combined systolic (congestive) and diastolic (congestive) heart failure: Secondary | ICD-10-CM | POA: Insufficient documentation

## 2018-08-24 DIAGNOSIS — Z7982 Long term (current) use of aspirin: Secondary | ICD-10-CM | POA: Diagnosis not present

## 2018-08-24 DIAGNOSIS — N183 Chronic kidney disease, stage 3 unspecified: Secondary | ICD-10-CM

## 2018-08-24 DIAGNOSIS — I5022 Chronic systolic (congestive) heart failure: Secondary | ICD-10-CM

## 2018-08-24 DIAGNOSIS — E785 Hyperlipidemia, unspecified: Secondary | ICD-10-CM | POA: Diagnosis not present

## 2018-08-24 DIAGNOSIS — F1010 Alcohol abuse, uncomplicated: Secondary | ICD-10-CM | POA: Diagnosis not present

## 2018-08-24 DIAGNOSIS — Z801 Family history of malignant neoplasm of trachea, bronchus and lung: Secondary | ICD-10-CM | POA: Insufficient documentation

## 2018-08-24 DIAGNOSIS — Z8249 Family history of ischemic heart disease and other diseases of the circulatory system: Secondary | ICD-10-CM | POA: Diagnosis not present

## 2018-08-24 DIAGNOSIS — Z7902 Long term (current) use of antithrombotics/antiplatelets: Secondary | ICD-10-CM | POA: Insufficient documentation

## 2018-08-24 DIAGNOSIS — I25810 Atherosclerosis of coronary artery bypass graft(s) without angina pectoris: Secondary | ICD-10-CM | POA: Insufficient documentation

## 2018-08-24 DIAGNOSIS — F419 Anxiety disorder, unspecified: Secondary | ICD-10-CM | POA: Insufficient documentation

## 2018-08-24 DIAGNOSIS — Z951 Presence of aortocoronary bypass graft: Secondary | ICD-10-CM | POA: Insufficient documentation

## 2018-08-24 LAB — BASIC METABOLIC PANEL
Anion gap: 10 (ref 5–15)
BUN: 21 mg/dL — ABNORMAL HIGH (ref 6–20)
CO2: 27 mmol/L (ref 22–32)
Calcium: 9.3 mg/dL (ref 8.9–10.3)
Chloride: 96 mmol/L — ABNORMAL LOW (ref 98–111)
Creatinine, Ser: 1.86 mg/dL — ABNORMAL HIGH (ref 0.61–1.24)
GFR calc Af Amer: 45 mL/min — ABNORMAL LOW (ref >60)
GFR calc non Af Amer: 39 mL/min — ABNORMAL LOW (ref >60)
Glucose, Bld: 110 mg/dL — ABNORMAL HIGH (ref 70–99)
Potassium: 3.6 mmol/L (ref 3.5–5.1)
SODIUM: 133 mmol/L — AB (ref 135–145)

## 2018-08-24 MED ORDER — CARVEDILOL 6.25 MG PO TABS: 6.2500 mg | ORAL_TABLET | Freq: Two times a day (BID) | ORAL | 11 refills | Status: DC

## 2018-08-24 NOTE — Progress Notes (Signed)
Advanced Heart Failure Clinic Note   Referring Physician: Dr. Mosetta Putt PCP: Howard Pouch, MD HF Cardiologist: Dr [name] VVS: Dr Randie Heinz   HPI: Randy [name] is a 57 y.o. male with history of combined HF (diagnosed 03/2017), HTN, HLD, CAD s/p CABG 2010 and PCI (last ~2014), former tobacco use, CVA, and AAA. He is intolerant entresto.   Admitted 03/2017 with CHF exacerbation. Echo showed EF 30-35% with grade 2 DD. Cardiology was consulted and he refused [facility] for further work up.   Quit smoking Jan 1. Smoked for ~50 years prior 1 ppd. ETOH use (Bud Light) 18 pack/week, decreased from 18-24 beers daily. Has not smoked or had alcohol since 05/12/2018.   He was initially seen in the HF clinic June 2019. At that time he was set up for heart cath and switched to entresto. He was not on spiro with a history of hyperkalemia. He underwent LHC/RHC in July. He did not require intervention. Reduced EF was thought to be related to ETOH abuse.   Admitted to [facility] 05/12/2018 with increased dyspnea and chest pain. Elevated troponin was thought to be in the setting of demand ischemia. He has an ECHO completed that showed severely reduced EF 15%. Diuresed with IV lasix but had poor response so milrinone was added. Diuresed over 20 pounds. Once adequately diuresed interventional cardiology took him to the cath lab and he underwent PCI DES to left main. After heart catherization, milrinone was gradually weaned off. He will continue on aspirin and plavix for 1 year.  He was not on bb due to low output. He was not on entresto due to intolerance and not on an ace due to elevated K and creatinine. PICC line was removed prior to discharge. Vascular consulted and repeated carotid U/S. This suggested total occlusion R ICA and 50-75% LICA. He did not require surgical intervention but will continue statin and follow up with VVS in 1 year. Discharge weight was 168 pounds.   Admitted 1/2-07/03/18 with volume overload after stopping  medications for 3 days. He was hypoxic on arrival to ED. He diuresed with IV lasix. Repeat echo showed EF 20-25% (improved from 15%). He had a CT of chest that showed LLL nodule with recommendations of repeat CT vs PET in 3 months. DC weight: 168 lbs.   Today he returns for HF follow up. Last visit he was started on 3.125 mg carvedilol twice a day. Overall feeling fine. Denies SOB/PND/Orthopnea. No chest pain. Appetite ok. No fever or chills. Weight at home 169-170 pounds. Taking all medications. Followed by Paramedicine. Drinking about 2 beers a week.   CT Chest 07/02/2018 1. Positive for a macrolobulated 1.7 x 1.6 x 1.5 cm low-density left lower lobe pulmonary nodule. Differential considerations include hamartoma, primary bronchogenic carcinoma, and less likely (given isolated finding) metastatic disease. Consider one of the following in 3 months for both low-risk and high-risk individuals: (a) repeat chest CT, (b) follow-up PET-CT, or (c) tissue sampling. This recommendation follows the consensus statement: Guidelines for Management of Incidental Pulmonary Nodules Detected on CT Images: From the Fleischner Society 2017; Radiology 2017; 284:228-243. 2.  Aortic Atherosclerosis (ICD10-170.0) 3. Coronary artery calcifications. 4. Small hiatal hernia.  Echo 07/02/2018: - Left ventricle: The cavity size was mildly dilated. Wall   thickness was normal. Systolic function was severely reduced. The   estimated ejection fraction was in the range of 20% to 25%.   Diffuse hypokinesis. Doppler parameters are consistent with a   reversible restrictive pattern, indicative of decreased  left   ventricular diastolic compliance and/or increased left atrial   pressure (grade 3 diastolic dysfunction). - Left atrium: The atrium was severely dilated. - Right ventricle: The cavity size was moderately dilated. Wall   thickness was normal. Systolic function was moderately reduced. - Right atrium: The atrium was  severely dilated. - Pulmonary arteries: Systolic pressure was mildly increased. PA   peak pressure: 37 mm Hg (S).  ECHO 04/2018 Left ventricle:  15%. Diffuse hypokinesis. Doppler   parameters are consistent with restrictive physiology, indicative   of decreased left ventricular diastolic compliance and/or   increased left atrial pressure. Doppler parameters are consistent   with high ventricular filling pressure. - Left atrium: The atrium was severely dilated. - Right ventricle: The cavity size was mildly dilated. Systolic   function was moderately reduced. - Right atrium: The atrium was moderately dilated. - Tricuspid valve: There was moderate regurgitation. - Pulmonary arteries: Systolic pressure was mildly increased. PA   peak pressure: 44 mm Hg (S).  ECHO 12/2017  LVEF 30-35%  RA severely dilated  RV mildly dilated  Peak PA pressure 43 mm hg  LHC 04/2018   Mid LM lesion is 90% stenosed.  Prox LAD lesion is 100% stenosed.  Ost 2nd Mrg to 2nd Mrg lesion is 100% stenosed.  2nd Mrg lesion is 99% stenosed.  Prox Cx to Mid Cx lesion is 30% stenosed.  Ost RCA to Prox RCA lesion is 100% stenosed.  Mid Graft lesion is 30% stenosed.  Origin to Prox Graft lesion is 100% stenosed.  SVG.  Prox Graft lesion is 90% stenosed.  A drug-eluting stent was successfully placed using a STENT SYNERGY DES 3X16.  Post intervention, there is a 10% residual stenosis.  1.  Severe stenosis of the native left mainstem, treated successfully with PCI using a 3.0 x 16 mm Synergy DES postdilated to high pressure with a 3.25 mm noncompliant balloon 2.  Chronic occlusion of the LAD supplied by a widely patent LIMA graft 3.  Severe stenosis of the saphenous vein graft to first OM with total occlusion of the OM branch just beyond the coronary anastomosis, unchanged from the previous study 4.  Patent saphenous vein graft to first diagonal supplying collaterals to the obtuse marginal branch 5.  Known  chronic occlusion of the native RCA and saphenous vein graft RCA, not selectively injected Recommend uninterrupted dual antiplatelet therapy with Aspirin  daily and Clopidogrel  daily for a minimum of 12 months (ACS - Class I recommendation).   RHC/LHC 12/2017   Ost RCA to Prox RCA lesion is 100% stenosed.  Mid LM lesion is 80% stenosed.  Prox LAD lesion is 100% stenosed.  Ost 2nd Mrg to 2nd Mrg lesion is 100% stenosed.  2nd Mrg lesion is 99% stenosed.  Origin to Prox Graft lesion is 100% stenosed.  Prox Cx to Mid Cx lesion is 30% stenosed.  Mid Graft lesion is 30% stenosed.  Findings: RA = 4 RV = 22/5 PA = 27/10 (16) PCW = 15 Fick cardiac output/index = 5.1/2.5 FA sat = 94% PA sat = 66%, 66%  Review of systems complete and found to be negative unless listed in HPI.   Past Medical History:  Diagnosis Date  . AAA (abdominal aortic aneurysm) [facility]) July 2013  . Alcohol abuse   . Anxiety   . CAD (coronary artery disease)    a. s/p CABG 2010.  . Carotid artery occlusion    a. prior R CEA, stenting of L carotid  2014. b. Severe carotid restenosis in 2017 -> planned for surgery but pt did not f/u.  [name] Chronic combined systolic and diastolic CHF (congestive heart failure) (HCC)   . GERD (gastroesophageal reflux disease)   . Heart attack (HCC) 2010  . Hx of CABG 2010  . Hyperlipidemia   . Hypertension   . Lung nodule   . Polysubstance abuse (HCC)    a. mention of cocaine positivity in 2010 with patient stating unknowingly exposed, also EtOH.  [name] PVD (peripheral vascular disease) (HCC)    a. PAD s/p failed attempt at stenting of the left common iliac artery 2014.  . Stroke (HCC)   . Uncontrolled hypertension 02/02/2012    Current Outpatient Medications  Medication Sig Dispense Refill  . aspirin EC 81 MG tablet Take 1 tablet (81 mg total) by mouth daily. 30 tablet 2  . atorvastatin (LIPITOR) 80 MG tablet Take 1 tablet (80 mg total) by mouth every evening. 90 tablet  3  . carvedilol (COREG) 3.125 MG tablet Take 1 tablet (3.125 mg total) by mouth 2 (two) times daily. 60 tablet 11  . citalopram (CELEXA) 20 MG tablet Take 1 tablet (20 mg total) by mouth daily. 90 tablet 0  . clopidogrel (PLAVIX) 75 MG tablet Take 1 tablet (75 mg total) by mouth daily with breakfast. 30 tablet 5  . digoxin (LANOXIN) 0.125 MG tablet Take 1 tablet (0.125 mg total) by mouth daily. 30 tablet 5  . ezetimibe (ZETIA) 10 MG tablet Take 1 tablet (10 mg total) by mouth daily. 30 tablet 5  . hydrALAZINE (APRESOLINE) 100 MG tablet Take 1 tablet (100 mg total) by mouth every 8 (eight) hours. 90 tablet 5  . isosorbide mononitrate (IMDUR) 60 MG 24 hr tablet Take 1 tablet (60 mg total) by mouth daily. 30 tablet 5  . lisinopril (PRINIVIL,ZESTRIL) 5 MG tablet Take 1 tablet (5 mg total) by mouth daily. 30 tablet 11  . nitroGLYCERIN (NITROSTAT) 0.4 MG SL tablet Place 1 tablet (0.4 mg total) under the tongue every 5 (five) minutes x 3 doses as needed for chest pain. 25 tablet 12  . potassium chloride SA (K-DUR,KLOR-CON) 20 MEQ tablet Take 2 tablets (40 mEq total) by mouth daily. 60 tablet 3  . spironolactone (ALDACTONE) 25 MG tablet Take 1 tablet (25 mg total) by mouth daily. 30 tablet 5  . torsemide (DEMADEX) 20 MG tablet Take 4 tablets (80 mg total) by mouth daily. 120 tablet 5   No current facility-administered medications for this encounter.     Allergies  Allergen Reactions  . Entresto [Sacubitril-Valsartan] Nausea Only and Other (See Comments)    Dizziness and "sick on stomach"      Social History   Socioeconomic History  . Marital status: Widowed    Spouse name: Not on file  . Number of children: 1  . Years of education: 9th  . Highest education level: Not on file  Occupational History  . Occupation: not working.   Social Needs  . Financial resource strain: Not very hard  . Food insecurity:    Worry: Never true    Inability: Never true  . Transportation needs:    Medical:  No    Non-medical: No  Tobacco Use  . Smoking status: Former Smoker    Packs/day: 0.50    Years: 30.00    Pack years: 15.00    Types: Cigarettes    Last attempt to quit: 05/04/2018    Years since quitting: 0.3  . Smokeless  tobacco: Never Used  Substance and Sexual Activity  . Alcohol use: Not Currently    Alcohol/week: 5.0 standard drinks    Types: 5 Cans of beer per week    Comment: occasionally  . Drug use: Yes    Types: Marijuana  . Sexual activity: Yes  Lifestyle  . Physical activity:    Days per week: Not on file    Minutes per session: Not on file  . Stress: Not on file  Relationships  . Social connections:    Talks on phone: Not on file    Gets together: Not on file    Attends religious service: Not on file    Active member of club or organization: Not on file    Attends meetings of clubs or organizations: Not on file    Relationship status: Not on file  . Intimate partner violence:    Fear of current or ex partner: Not on file    Emotionally abused: Not on file    Physically abused: Not on file    Forced sexual activity: Not on file  Other Topics Concern  . Not on file  Social History Narrative   Pt is widowed and lives alone.   Patient is right handed   Patient drinks about 2cups of caffeine daily.         Family History  Problem Relation Age of Onset  . Asthma Mother   . Diabetes Mother   . Hyperlipidemia Mother   . Hypertension Mother   . Lung cancer Father   . Cancer Father   . Stroke Sister     Vitals:   08/24/18 1111  BP: 118/66  Pulse: 66  SpO2: 98%  Weight: 80.3 kg (177 lb)   Wt Readings from Last 3 Encounters:  08/24/18 80.3 kg (177 lb)  08/16/18 78 kg (172 lb)  08/04/18 76.7 kg (169 lb)     PHYSICAL EXAM: General:  Well appearing. No resp difficulty HEENT: normal Neck: supple. no JVD. Carotids 2+ bilat; no bruits. No lymphadenopathy or thryomegaly appreciated. Cor: PMI nondisplaced. Regular rate & rhythm. No rubs, gallops or  murmurs. Lungs: clear Abdomen: soft, nontender, nondistended. No hepatosplenomegaly. No bruits or masses. Good bowel sounds. Extremities: no cyanosis, clubbing, rash, edema Neuro: alert & orientedx3, cranial nerves grossly intact. moves all 4 extremities w/o difficulty. Affect pleasant  ASSESSMENT & PLAN:  1. Chronic systolic HF Echo in 2017 showed EF 55% - Echo 03/2017: EF 30-35% with grade 2 DD. ECHO in November down to 15%. Echo 06/2018 EF 20-25% NYHA II.  Continue torsemide 80 mg daily + 40 meq K daily.  - Increase carvedilol to 6.25 mg twice a day.  - Continue digoxin 0.125 mg daily.  - Continue 25 mg spironolactone daily - Continue 5 mg lisinopril daily. Intolerant entresto due to nausea.  - Continue hydralazine 100 mg three times a day + imdur 60 mg daily.  -- Discussed possibility of ICD if EF remains low. Repeat ECHO next month.  - Continue HF paramedicine.  - Refuses cardiac rehab.  2. CAD s/p CABG 2010 -LHC7/2/19 showed patent LIMA to LAD, SVG to RCA occluded, SVG to OM-2 with 95% prox lesion. Has unprotected AV-groove LCX which feeds collaterals to distal RCA.  - 11/20 S/P DES to left main.  - Plan for dual antiplatelet therapy with asa mg daily + plavix for minimum of 12 months.  -no s/s ischemia.  -  On ASA, plavix, statin, zetia -  carvedilol.   -  Refuses cardiac rehab.   3. Hx of Tobacco use - Quit smoking in November. No change.  4. HTN Stable   5. CKD Stage III - Baseline ~1.2-1.4 -Check BMET today   6. ?OSA - He snores heavily. - Referred for sleep study last appointment. Has not yet heard from them.   7. Alcohol abuse -Congratulated on cutting back to two beers a week.   8. Carotid Stenosis Carotid US 04/14/16: Right ICA 80-99%, Left ICA upper range <50% vs low range >50%. Carotid U/S completed 04/2018 . This suggested total occlusion R ICA and 50-75% LICA. He did not require surgical intervention but will continue statin and follow up with VVS in 1  year. No change.  9. LLL nodule - Noted on CT during admission. Will need follow up CT vs PET/CT in 3 months (April 2020). We discussed briefly. He would not want to travel far for PET scan. He would like to discuss further at his appointment with Dr [name] in March.   Follow up in 4 weeks with Dr [name] and an ECHO.  Greater than 50% of the (total minutes 25) visit spent in counseling/coordination of care regarding the above. I personally called Katie with Paramedicine to discuss medication changes.    Tonye Becket, NP 08/24/18

## 2018-08-24 NOTE — Telephone Encounter (Signed)
Spoke to Sunol with paramedicine about medication changes for torsemide and potassium

## 2018-08-24 NOTE — Telephone Encounter (Signed)
Spoke with pt about renal function and medication changes. Scheduled lab appointment for bmet wed march 4th at 45

## 2018-08-24 NOTE — Telephone Encounter (Signed)
-----   Message from Sherald Hess, NP sent at 08/24/2018  1:29 PM EST ----- Renal function elevated. Please call and ask him to hold torsemide and potassium tomorrow. He will then resume torsemide 60 mg daily and 20 meq of potassium daily. Repeat BMET next week.

## 2018-08-24 NOTE — Telephone Encounter (Signed)
-----   Message from Amy D Clegg, NP sent at 08/24/2018  1:29 PM EST ----- Renal function elevated. Please call and ask him to hold torsemide and potassium tomorrow. He will then resume torsemide 60 mg daily and 20 meq of potassium daily. Repeat BMET next week. 

## 2018-08-24 NOTE — Patient Instructions (Signed)
INCREASE Coreg to 6.25 mg, one tab twice daily  Labs today We will only contact you if something comes back abnormal or we need to make some changes. Otherwise no news is good news!  Keep your follow up as scheduled  Do the following things EVERYDAY: 1) Weigh yourself in the morning before breakfast. Write it down and keep it in a log. 2) Take your medicines as prescribed 3) Eat low salt foods-Limit salt (sodium) to 2000 mg per day.  4) Stay as active as you can everyday 5) Limit all fluids for the day to less than 2 liters

## 2018-08-26 ENCOUNTER — Encounter (HOSPITAL_COMMUNITY): Payer: Medicaid Other

## 2018-09-01 ENCOUNTER — Ambulatory Visit (HOSPITAL_COMMUNITY)
Admission: RE | Admit: 2018-09-01 | Discharge: 2018-09-01 | Disposition: A | Discharge status: Home / Self Care | Payer: Medicaid Other | Source: Ambulatory Visit | Attending: Cardiology | Admitting: Cardiology

## 2018-09-01 DIAGNOSIS — I5022 Chronic systolic (congestive) heart failure: Secondary | ICD-10-CM | POA: Diagnosis not present

## 2018-09-01 LAB — BASIC METABOLIC PANEL
Anion gap: 11 (ref 5–15)
BUN: 16 mg/dL (ref 6–20)
CO2: 27 mmol/L (ref 22–32)
Calcium: 9.3 mg/dL (ref 8.9–10.3)
Chloride: 97 mmol/L — ABNORMAL LOW (ref 98–111)
Creatinine, Ser: 1.59 mg/dL — ABNORMAL HIGH (ref 0.61–1.24)
GFR calc Af Amer: 54 mL/min — ABNORMAL LOW (ref >60)
GFR calc non Af Amer: 47 mL/min — ABNORMAL LOW (ref >60)
Glucose, Bld: 103 mg/dL — ABNORMAL HIGH (ref 70–99)
POTASSIUM: 3.9 mmol/L (ref 3.5–5.1)
Sodium: 135 mmol/L (ref 135–145)

## 2018-09-02 ENCOUNTER — Telehealth (HOSPITAL_COMMUNITY): Payer: Self-pay | Admitting: Cardiology

## 2018-09-02 ENCOUNTER — Other Ambulatory Visit (HOSPITAL_COMMUNITY): Payer: Self-pay

## 2018-09-02 MED ORDER — POTASSIUM CHLORIDE CRYS ER 20 MEQ PO TBCR: 20.0000 meq | EXTENDED_RELEASE_TABLET | Freq: Every day | ORAL | 3 refills | Status: DC

## 2018-09-02 MED ORDER — CARVEDILOL 3.125 MG PO TABS: 3.1250 mg | ORAL_TABLET | Freq: Two times a day (BID) | ORAL | 11 refills | Status: DC

## 2018-09-02 MED ORDER — TORSEMIDE 20 MG PO TABS: 60.0000 mg | ORAL_TABLET | Freq: Every day | ORAL | 5 refills | Status: DC

## 2018-09-02 NOTE — Telephone Encounter (Signed)
S/p home visit with Para Medicine  Medication reconciliation needed for torsemide and potassium after lab appt  Also patients Coreg was increased at last OV Patient reported extreme nausea with increased dose, currently only taking Coreg 6.25 mg at night so he can sleep thru the nausea.  Above discussed with Otilio Saber, PA  Patient should decrease coreg to 3.125 BID  Medication list updated and Katie aware.

## 2018-09-02 NOTE — Progress Notes (Signed)
Paramedicine Encounter    Patient ID: Randy [name], male    DOB: 10/02/1960, 58 y.o.   MRN: [id number]   Patient Care Team: Howard Pouch, MD as PCP - General Bensimhon, Bevelyn Buckles, MD as PCP - Cardiology (Cardiology) Deborha Payment as Physician Assistant (Cardiology) Marvel Plan, MD as Consulting Physician (Neurology) Burna Sis, LCSW as Social Worker (Licensed Clinical Social Worker)  Patient Active Problem List   Diagnosis Date Noted  . Acute on chronic systolic heart failure (HCC) 07/01/2018  . Chronic kidney disease (CKD), stage III (moderate) (HCC) 07/01/2018  . CAD (coronary atherosclerotic disease) 07/01/2018  . H/O ETOH abuse 07/01/2018  . Acute on chronic congestive heart failure (HCC)   . NSTEMI (non-ST elevated myocardial infarction) (HCC) 05/12/2018  . Acute on chronic systolic (congestive) heart failure (HCC)   . Carotid stenosis, right   . Acute kidney injury superimposed on CKD (HCC)   . History of tobacco abuse 10/23/2017  . Hyperkalemia 09/18/2017  . Systolic heart failure (HCC) 07/08/2017  . Acute congestive heart failure (HCC)   . Dyspnea 03/23/2017  . Cellulitis of right ankle 01/23/2017  . Muscle spasm 10/23/2016  . Insomnia 04/11/2016  . Orthopnea 03/31/2016  . 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 HTN (hypertension) 06/15/2014  . HLD (hyperlipidemia) 04/05/2014  . PVD (peripheral vascular disease) with claudication (HCC) 05/09/2013  . GERD (gastroesophageal reflux disease) 03/28/2013  . AAA (abdominal aortic aneurysm) without rupture (HCC) 10/28/2012  . Anxiety state 02/12/2012  . Occlusion and stenosis of carotid artery without mention of cerebral infarction 01/19/2012  . Pulmonary nodule 05/19/2011  . CAD, ARTERY BYPASS GRAFT 10/19/2008  . DISC DISEASE, LUMBAR 10/19/2008    Current Outpatient Medications:  .  aspirin EC 81 MG tablet, Take 1 tablet (81 mg total) by mouth daily., Disp: 30 tablet,  Rfl: 2 .  atorvastatin (LIPITOR) 80 MG tablet, Take 1 tablet (80 mg total) by mouth every evening., Disp: 90 tablet, Rfl: 3 .  carvedilol (COREG) 6.25 MG tablet, Take 1 tablet (6.25 mg total) by mouth 2 (two) times daily. (Patient taking differently: Take 6.25 mg by mouth 2 (two) times daily. ), Disp: 60 tablet, Rfl: 11 .  clopidogrel (PLAVIX) 75 MG tablet, Take 1 tablet (75 mg total) by mouth daily with breakfast., Disp: 30 tablet, Rfl: 5 .  digoxin (LANOXIN) 0.125 MG tablet, Take 1 tablet (0.125 mg total) by mouth daily., Disp: 30 tablet, Rfl: 5 .  ezetimibe (ZETIA) 10 MG tablet, Take 1 tablet (10 mg total) by mouth daily., Disp: 30 tablet, Rfl: 5 .  hydrALAZINE (APRESOLINE) 100 MG tablet, Take 1 tablet (100 mg total) by mouth every 8 (eight) hours., Disp: 90 tablet, Rfl: 5 .  isosorbide mononitrate (IMDUR) 60 MG 24 hr tablet, Take 1 tablet (60 mg total) by mouth daily., Disp: 30 tablet, Rfl: 5 .  lisinopril (PRINIVIL,ZESTRIL) 5 MG tablet, Take 1 tablet (5 mg total) by mouth daily., Disp: 30 tablet, Rfl: 11 .  potassium chloride SA (K-DUR,KLOR-CON) 20 MEQ tablet, Take 2 tablets (40 mEq total) by mouth daily. (Patient taking differently: Take 40 mEq by mouth daily. ), Disp: 60 tablet, Rfl: 3 .  spironolactone (ALDACTONE) 25 MG tablet, Take 1 tablet (25 mg total) by mouth daily., Disp: 30 tablet, Rfl: 5 .  torsemide (DEMADEX) 20 MG tablet, Take 4 tablets (80 mg total) by mouth daily. (Patient taking differently: Take 80 mg by mouth daily. ), Disp: 120 tablet, Rfl:  5 .  citalopram (CELEXA) 20 MG tablet, Take 1 tablet (20 mg total) by mouth daily., Disp: 90 tablet, Rfl: 0 .  nitroGLYCERIN (NITROSTAT) 0.4 MG SL tablet, Place 1 tablet (0.4 mg total) under the tongue every 5 (five) minutes x 3 doses as needed for chest pain. (Patient not taking: Reported on 09/02/2018), Disp: 25 tablet, Rfl: 12 Allergies  Allergen Reactions  . Entresto [Sacubitril-Valsartan] Nausea Only and Other (See Comments)     Dizziness and "sick on stomach"      Social History   Socioeconomic History  . Marital status: Widowed    Spouse name: Not on file  . Number of children: 1  . Years of education: 9th  . Highest education level: Not on file  Occupational History  . Occupation: not working.   Social Needs  . Financial resource strain: Not very hard  . Food insecurity:    Worry: Never true    Inability: Never true  . Transportation needs:    Medical: No    Non-medical: No  Tobacco Use  . Smoking status: Former Smoker    Packs/day: 0.50    Years: 30.00    Pack years: 15.00    Types: Cigarettes    Last attempt to quit: 05/04/2018    Years since quitting: 0.3  . Smokeless tobacco: Never Used  Substance and Sexual Activity  . Alcohol use: Not Currently    Alcohol/week: 5.0 standard drinks    Types: 5 Cans of beer per week    Comment: occasionally  . Drug use: Yes    Types: Marijuana  . Sexual activity: Yes  Lifestyle  . Physical activity:    Days per week: Not on file    Minutes per session: Not on file  . Stress: Not on file  Relationships  . Social connections:    Talks on phone: Not on file    Gets together: Not on file    Attends religious service: Not on file    Active member of club or organization: Not on file    Attends meetings of clubs or organizations: Not on file    Relationship status: Not on file  . Intimate partner violence:    Fear of current or ex partner: Not on file    Emotionally abused: Not on file    Physically abused: Not on file    Forced sexual activity: Not on file  Other Topics Concern  . Not on file  Social History Narrative   Pt is widowed and lives alone.   Patient is right handed   Patient drinks about 2cups of caffeine daily.       Physical Exam      Future Appointments  Date Time Provider Department Center  09/21/2018  9:00 AM [facility] ECHO [facility]  09/21/2018 10:20 AM Bensimhon, Bevelyn Buckles, MD [facility] None    BP 126/82    Pulse 66   Resp 15   Wt 171 lb (77.6 kg)   SpO2 98%   BMI 23.85 kg/m   Weight yesterday-172 Last visit weight-177 @ clinic   Pt reports he is doing great, he just took all his pill bottles to the pharmacy for refills. He denies any sob, no c/p.  He drinks about 3-4 beers a week, usually on the wknd.  Pt states he thinks the carvedilol increase has been making him feel sick, pt states when he takes it, he feels nauseated and dizzy. So he had been doing the 3.125  just in the morning and then the 6.25 at bedtime but now he is just doing the 6.25 at bedtime and he feels fine.  Contacted the clinic and no answer--will try again and see what they want to do.   His torsemide and potassium was changed from his last clinic visit to 60mg  daily and 20 of potassium but it was not changed in the chart.  ---spoke to chantel and per note he is to go back to the 3.125mg  BID--called pt and let him know and he is agreeable.    Kerry Hough, EMT-Paramedic [phone] [facility] Paramedic  09/02/18

## 2018-09-08 ENCOUNTER — Telehealth (HOSPITAL_COMMUNITY): Payer: Self-pay

## 2018-09-09 NOTE — Telephone Encounter (Signed)
Called pt for a check in this week-he reports doing well. He is back on 3.125mg  of carvedilol BID and feels fine. I will make home visit next week.   Kerry Hough, EMT-Paramedic  09/09/18

## 2018-09-14 ENCOUNTER — Other Ambulatory Visit (HOSPITAL_COMMUNITY): Payer: Self-pay

## 2018-09-14 NOTE — Progress Notes (Signed)
Paramedicine Encounter    Patient ID: Randy [name], male    DOB: [birth year], 59 y.o.   MRN: [id number]   Patient Care Team: Howard Pouch, MD as PCP - General Bensimhon, Bevelyn Buckles, MD as PCP - Cardiology (Cardiology) Deborha Payment as Physician Assistant (Cardiology) Marvel Plan, MD as Consulting Physician (Neurology) Burna Sis, LCSW as Social Worker (Licensed Clinical Social Worker)  Patient Active Problem List   Diagnosis Date Noted  . Acute on chronic systolic heart failure (HCC) 07/01/2018  . Chronic kidney disease (CKD), stage III (moderate) (HCC) 07/01/2018  . CAD (coronary atherosclerotic disease) 07/01/2018  . H/O ETOH abuse 07/01/2018  . Acute on chronic congestive heart failure (HCC)   . NSTEMI (non-ST elevated myocardial infarction) (HCC) 05/12/2018  . Acute on chronic systolic (congestive) heart failure (HCC)   . Carotid stenosis, right   . Acute kidney injury superimposed on CKD (HCC)   . History of tobacco abuse 10/23/2017  . Hyperkalemia 09/18/2017  . Systolic heart failure (HCC) 07/08/2017  . Acute congestive heart failure (HCC)   . Dyspnea 03/23/2017  . Cellulitis of right ankle 01/23/2017  . Muscle spasm 10/23/2016  . Insomnia 04/11/2016  . Orthopnea 03/31/2016  . 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 HTN (hypertension) 06/15/2014  . HLD (hyperlipidemia) 04/05/2014  . PVD (peripheral vascular disease) with claudication (HCC) 05/09/2013  . GERD (gastroesophageal reflux disease) 03/28/2013  . AAA (abdominal aortic aneurysm) without rupture (HCC) 10/28/2012  . Anxiety state 02/12/2012  . Occlusion and stenosis of carotid artery without mention of cerebral infarction 01/19/2012  . Pulmonary nodule 05/19/2011  . CAD, ARTERY BYPASS GRAFT 10/19/2008  . DISC DISEASE, LUMBAR 10/19/2008    Current Outpatient Medications:  .  aspirin EC 81 MG tablet, Take 1 tablet (81 mg total) by mouth daily., Disp: 30 tablet,  Rfl: 2 .  atorvastatin (LIPITOR) 80 MG tablet, Take 1 tablet (80 mg total) by mouth every evening., Disp: 90 tablet, Rfl: 3 .  carvedilol (COREG) 3.125 MG tablet, Take 1 tablet (3.125 mg total) by mouth 2 (two) times daily., Disp: 60 tablet, Rfl: 11 .  citalopram (CELEXA) 20 MG tablet, Take 1 tablet (20 mg total) by mouth daily., Disp: 90 tablet, Rfl: 0 .  clopidogrel (PLAVIX) 75 MG tablet, Take 1 tablet (75 mg total) by mouth daily with breakfast., Disp: 30 tablet, Rfl: 5 .  digoxin (LANOXIN) 0.125 MG tablet, Take 1 tablet (0.125 mg total) by mouth daily., Disp: 30 tablet, Rfl: 5 .  ezetimibe (ZETIA) 10 MG tablet, Take 1 tablet (10 mg total) by mouth daily., Disp: 30 tablet, Rfl: 5 .  hydrALAZINE (APRESOLINE) 100 MG tablet, Take 1 tablet (100 mg total) by mouth every 8 (eight) hours., Disp: 90 tablet, Rfl: 5 .  isosorbide mononitrate (IMDUR) 60 MG 24 hr tablet, Take 1 tablet (60 mg total) by mouth daily., Disp: 30 tablet, Rfl: 5 .  lisinopril (PRINIVIL,ZESTRIL) 5 MG tablet, Take 1 tablet (5 mg total) by mouth daily., Disp: 30 tablet, Rfl: 11 .  potassium chloride SA (K-DUR,KLOR-CON) 20 MEQ tablet, Take 1 tablet (20 mEq total) by mouth daily., Disp: 60 tablet, Rfl: 3 .  torsemide (DEMADEX) 20 MG tablet, Take 3 tablets (60 mg total) by mouth daily., Disp: 120 tablet, Rfl: 5 .  nitroGLYCERIN (NITROSTAT) 0.4 MG SL tablet, Place 1 tablet (0.4 mg total) under the tongue every 5 (five) minutes x 3 doses as needed for chest pain. (Patient not taking:  Reported on 09/02/2018), Disp: 25 tablet, Rfl: 12 .  spironolactone (ALDACTONE) 25 MG tablet, Take 1 tablet (25 mg total) by mouth daily., Disp: 30 tablet, Rfl: 5 Allergies  Allergen Reactions  . Entresto [Sacubitril-Valsartan] Nausea Only and Other (See Comments)    Dizziness and "sick on stomach"      Social History   Socioeconomic History  . Marital status: Widowed    Spouse name: Not on file  . Number of children: 1  . Years of education: 9th  .  Highest education level: Not on file  Occupational History  . Occupation: not working.   Social Needs  . Financial resource strain: Not very hard  . Food insecurity:    Worry: Never true    Inability: Never true  . Transportation needs:    Medical: No    Non-medical: No  Tobacco Use  . Smoking status: Former Smoker    Packs/day: 0.50    Years: 30.00    Pack years: 15.00    Types: Cigarettes    Last attempt to quit: 05/04/2018    Years since quitting: 0.3  . Smokeless tobacco: Never Used  Substance and Sexual Activity  . Alcohol use: Not Currently    Alcohol/week: 5.0 standard drinks    Types: 5 Cans of beer per week    Comment: occasionally  . Drug use: Yes    Types: Marijuana  . Sexual activity: Yes  Lifestyle  . Physical activity:    Days per week: Not on file    Minutes per session: Not on file  . Stress: Not on file  Relationships  . Social connections:    Talks on phone: Not on file    Gets together: Not on file    Attends religious service: Not on file    Active member of club or organization: Not on file    Attends meetings of clubs or organizations: Not on file    Relationship status: Not on file  . Intimate partner violence:    Fear of current or ex partner: Not on file    Emotionally abused: Not on file    Physically abused: Not on file    Forced sexual activity: Not on file  Other Topics Concern  . Not on file  Social History Narrative   Pt is widowed and lives alone.   Patient is right handed   Patient drinks about 2cups of caffeine daily.       Physical Exam      Future Appointments  Date Time Provider Department Center  09/21/2018  9:00 AM [facility]  09/21/2018 10:20 AM Bensimhon, Bevelyn Buckles, MD [facility] None    BP (!) 160/110   Pulse (!) 102   Resp 15   Wt 169 lb (76.7 kg)   SpO2 98%   BMI 23.57 kg/m   Weight yesterday-167 Last visit weight-171  Pt reports he is feeling great. He reports good compliance with  meds. He fills his own pill box and no errors noted. Weight staying good. He is not sure if he is going to make it to his ECHO next week due to COVID19.  He denies sob, no dizziness, no issues or complaints at this time.  He is still drinking some-a few beers a week.  His b/p is elevated as noted. He missed his mid dose of hydralazine today and he normally misses several of those doses per week--advised him to be sure to take it 3x a day  to keep b/p more stable. He also has been doing some work outside today as well. He denies any symptoms at this time. He is going to call next week to ensure its ok he come up there for his appointment.   Kerry Hough, EMT-Paramedic [phone] [facility] Paramedic  09/14/18

## 2018-09-20 ENCOUNTER — Telehealth (HOSPITAL_COMMUNITY): Payer: Self-pay | Admitting: Licensed Clinical Social Worker

## 2018-09-20 NOTE — Telephone Encounter (Signed)
CSW reached out to pt to check in regarding food and medication status at this time. Pt reports he has several weeks of medication and that he has enough food for now though it was difficult to get food with the stores being depleted.  CSW encouraged pt to reach out with any concerns and will continue to follow and assist as needed  Burna Sis, LCSW Clinical Social Worker [facility] [phone]

## 2018-09-21 ENCOUNTER — Encounter (HOSPITAL_COMMUNITY): Payer: Medicaid Other | Admitting: Internal Medicine

## 2018-09-21 ENCOUNTER — Other Ambulatory Visit (HOSPITAL_COMMUNITY): Payer: Medicaid Other

## 2018-09-27 ENCOUNTER — Other Ambulatory Visit (HOSPITAL_COMMUNITY): Payer: Self-pay

## 2018-09-27 NOTE — Progress Notes (Signed)
Paramedicine Encounter    Patient ID: Randy [name], male    DOB: Dec 12, 1960, 58 y.o.   MRN: [id number]   Patient Care Team: Howard Pouch, MD as PCP - General Bensimhon, Bevelyn Buckles, MD as PCP - Cardiology (Cardiology) Deborha Payment as Physician Assistant (Cardiology) Marvel Plan, MD as Consulting Physician (Neurology) Burna Sis, LCSW as Social Worker (Licensed Clinical Social Worker)  Patient Active Problem List   Diagnosis Date Noted  . Acute on chronic systolic heart failure (HCC) 07/01/2018  . Chronic kidney disease (CKD), stage III (moderate) (HCC) 07/01/2018  . CAD (coronary atherosclerotic disease) 07/01/2018  . H/O ETOH abuse 07/01/2018  . Acute on chronic congestive heart failure (HCC)   . NSTEMI (non-ST elevated myocardial infarction) (HCC) 05/12/2018  . Acute on chronic systolic (congestive) heart failure (HCC)   . Carotid stenosis, right   . Acute kidney injury superimposed on CKD (HCC)   . History of tobacco abuse 10/23/2017  . Hyperkalemia 09/18/2017  . Systolic heart failure (HCC) 07/08/2017  . Acute congestive heart failure (HCC)   . Dyspnea 03/23/2017  . Cellulitis of right ankle 01/23/2017  . Muscle spasm 10/23/2016  . Insomnia 04/11/2016  . Orthopnea 03/31/2016  . 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 HTN (hypertension) 06/15/2014  . HLD (hyperlipidemia) 04/05/2014  . PVD (peripheral vascular disease) with claudication (HCC) 05/09/2013  . GERD (gastroesophageal reflux disease) 03/28/2013  . AAA (abdominal aortic aneurysm) without rupture (HCC) 10/28/2012  . Anxiety state 02/12/2012  . Occlusion and stenosis of carotid artery without mention of cerebral infarction 01/19/2012  . Pulmonary nodule 05/19/2011  . CAD, ARTERY BYPASS GRAFT 10/19/2008  . DISC DISEASE, LUMBAR 10/19/2008    Current Outpatient Medications:  .  aspirin EC 81 MG tablet, Take 1 tablet (81 mg total) by mouth daily., Disp: 30 tablet,  Rfl: 2 .  atorvastatin (LIPITOR) 80 MG tablet, Take 1 tablet (80 mg total) by mouth every evening., Disp: 90 tablet, Rfl: 3 .  carvedilol (COREG) 3.125 MG tablet, Take 1 tablet (3.125 mg total) by mouth 2 (two) times daily., Disp: 60 tablet, Rfl: 11 .  citalopram (CELEXA) 20 MG tablet, Take 1 tablet (20 mg total) by mouth daily., Disp: 90 tablet, Rfl: 0 .  clopidogrel (PLAVIX) 75 MG tablet, Take 1 tablet (75 mg total) by mouth daily with breakfast., Disp: 30 tablet, Rfl: 5 .  digoxin (LANOXIN) 0.125 MG tablet, Take 1 tablet (0.125 mg total) by mouth daily., Disp: 30 tablet, Rfl: 5 .  ezetimibe (ZETIA) 10 MG tablet, Take 1 tablet (10 mg total) by mouth daily., Disp: 30 tablet, Rfl: 5 .  hydrALAZINE (APRESOLINE) 100 MG tablet, Take 1 tablet (100 mg total) by mouth every 8 (eight) hours., Disp: 90 tablet, Rfl: 5 .  isosorbide mononitrate (IMDUR) 60 MG 24 hr tablet, Take 1 tablet (60 mg total) by mouth daily., Disp: 30 tablet, Rfl: 5 .  lisinopril (PRINIVIL,ZESTRIL) 5 MG tablet, Take 1 tablet (5 mg total) by mouth daily., Disp: 30 tablet, Rfl: 11 .  nitroGLYCERIN (NITROSTAT) 0.4 MG SL tablet, Place 1 tablet (0.4 mg total) under the tongue every 5 (five) minutes x 3 doses as needed for chest pain. (Patient not taking: Reported on 09/02/2018), Disp: 25 tablet, Rfl: 12 .  potassium chloride SA (K-DUR,KLOR-CON) 20 MEQ tablet, Take 1 tablet (20 mEq total) by mouth daily., Disp: 60 tablet, Rfl: 3 .  spironolactone (ALDACTONE) 25 MG tablet, Take 1 tablet (25 mg total)  by mouth daily., Disp: 30 tablet, Rfl: 5 .  torsemide (DEMADEX) 20 MG tablet, Take 3 tablets (60 mg total) by mouth daily., Disp: 120 tablet, Rfl: 5 Allergies  Allergen Reactions  . Entresto [Sacubitril-Valsartan] Nausea Only and Other (See Comments)    Dizziness and "sick on stomach"      Social History   Socioeconomic History  . Marital status: Widowed    Spouse name: Not on file  . Number of children: 1  . Years of education: 9th  .  Highest education level: Not on file  Occupational History  . Occupation: not working.   Social Needs  . Financial resource strain: Not very hard  . Food insecurity:    Worry: Never true    Inability: Never true  . Transportation needs:    Medical: No    Non-medical: No  Tobacco Use  . Smoking status: Former Smoker    Packs/day: 0.50    Years: 30.00    Pack years: 15.00    Types: Cigarettes    Last attempt to quit: 05/04/2018    Years since quitting: 0.4  . Smokeless tobacco: Never Used  Substance and Sexual Activity  . Alcohol use: Not Currently    Alcohol/week: 5.0 standard drinks    Types: 5 Cans of beer per week    Comment: occasionally  . Drug use: Yes    Types: Marijuana  . Sexual activity: Yes  Lifestyle  . Physical activity:    Days per week: Not on file    Minutes per session: Not on file  . Stress: Not on file  Relationships  . Social connections:    Talks on phone: Not on file    Gets together: Not on file    Attends religious service: Not on file    Active member of club or organization: Not on file    Attends meetings of clubs or organizations: Not on file    Relationship status: Not on file  . Intimate partner violence:    Fear of current or ex partner: Not on file    Emotionally abused: Not on file    Physically abused: Not on file    Forced sexual activity: Not on file  Other Topics Concern  . Not on file  Social History Narrative   Pt is widowed and lives alone.   Patient is right handed   Patient drinks about 2cups of caffeine daily.       Physical Exam      Future Appointments  Date Time Provider Department Center  11/25/2018  8:00 AM [facility]  11/25/2018  9:00 AM Bensimhon, Bevelyn Buckles, MD [facility] None    BP 140/80   Pulse 70   Temp 98.2 F (36.8 C)   Resp 15   Wt 172 lb (78 kg)   SpO2 98%   BMI 23.99 kg/m   Weight yesterday-172  Last visit weight-169  Pt reports he is feeling great. He denies any sob,  no fevers, no cough, no edema noted. Denies c/p, no dizziness.  Pt continually misses his mid dose of hydralazine. Contacted clinic regarding this issues for further advice, amy was able to speak to pt on phone during visit and she reminded him again how important it is to take that mid dose is. Pt states he is out and about a lot of times and simply forgets it and does not take it with him when he leaves the house. He was reminded that  he should be staying at home with the COVID-19 around and pt states he will certainly try to do better and take the meds. I will call him next week to f/u.   Kerry Hough, EMT-Paramedic [phone] [facility] Paramedic  09/27/18

## 2018-10-13 ENCOUNTER — Telehealth (HOSPITAL_COMMUNITY): Payer: Self-pay

## 2018-10-13 NOTE — Telephone Encounter (Signed)
Contacted pt for phone visit, he does not want a home visit until the COVID-19 is over or unless its an absolute emergency.  He reported dizziness last week after taking his mid dose of hydralazine, I tried to get him to let me come by and check his b/p but he refused.  Today he states he feels better, his dizziness has subsided, he missed his pm dose of meds sat night and Sunday am he took all his morning meds at once instead of his staggering them out like he normally does and he felt terrible all day.  He denies sob, no c/p, weight staying around 173lbs. He reports good urine output. He denies issues with food, he has daughter to assist with med pick up and other things he needs. He reports good appetite. He denies any edema at this time.    Kerry Hough, EMT-Paramedic  10/13/18

## 2018-10-14 ENCOUNTER — Telehealth (HOSPITAL_COMMUNITY): Payer: Self-pay | Admitting: Licensed Clinical Social Worker

## 2018-10-14 NOTE — Telephone Encounter (Signed)
CSW reached out to pt to check in regarding food and medication status at this time. Pt has no concerns- has family nearby who get him everything he needs.  Pt lives alone but has his dogs that keep him company and stays in close contact with family.  CSW encouraged pt to reach out with any concerns and will continue to follow and assist as needed  Burna Sis, LCSW Clinical Social Worker [facility] [phone]

## 2018-11-01 ENCOUNTER — Other Ambulatory Visit (HOSPITAL_COMMUNITY): Payer: Self-pay

## 2018-11-01 NOTE — Progress Notes (Signed)
Telephone---  Contacted pt this week, pt does not wish to have a home visit at this time due to COVID-19. He now has a b/p monitor and he states his b/p have been ranging around 132/86 and his weight is between 167-170. He denies any sob, no edema noted, no dizziness, no h/a. he states 1 of 2 of his meds are giving his abd pain, he is trying to figure out which one it is by spacing out his meds to see which one it may be. He states he has been feeling great though.  Will continue to f/u with him. He knows to call me if he has any issues or concerns.   Kerry Hough, EMT-Paramedic 11/01/18

## 2018-11-15 ENCOUNTER — Telehealth (HOSPITAL_COMMUNITY): Payer: Self-pay

## 2018-11-15 NOTE — Telephone Encounter (Signed)
I called pt for a check in, he does not want me coming out yet due to COVID-19 going on. He said he was feeling great. He states his weights are 169, down from 170-171. He denies any sob, no dizziness, reports taking all his meds. He states he is taking his b/p sometimes, the last time it was 140s/90 something he said. I told him that was still higher than we like for it to be, I told him to keep taking his b/p and let me know if those numbers are still running elevated so we can let providers aware. He said he would.  Kerry Hough, EMT-Paramedic  11/15/18

## 2018-11-23 ENCOUNTER — Telehealth (HOSPITAL_COMMUNITY): Payer: Self-pay

## 2018-11-23 NOTE — Telephone Encounter (Signed)
Contacted pt to f/u. He states he is doing well, no complaints at this time. He is not ready for a home visit yet. Will continue to call for f/u.  Pt denies sob, no dizziness.  Taking all meds.   Kerry Hough, EMT-Paramedic  11/23/18

## 2018-11-25 ENCOUNTER — Other Ambulatory Visit (HOSPITAL_COMMUNITY): Payer: Medicaid Other

## 2018-11-25 ENCOUNTER — Encounter (HOSPITAL_COMMUNITY): Payer: Medicaid Other | Admitting: Internal Medicine

## 2018-12-29 ENCOUNTER — Telehealth (HOSPITAL_COMMUNITY): Payer: Self-pay

## 2018-12-29 NOTE — Telephone Encounter (Signed)
Spoke to pt today, he states he is doing great. He denies any complaints at this time. No issues with getting his meds or taking them. He states his weight is about 173lbs at home. He has been taking his b/p at home-he cant remember the numbers but states they are good. He has not let me come out since COVID-19 started but states he may let me come out to check on him next week. Will call back again next week.   Marylouise Stacks, EMT-Paramedic  12/29/18

## 2019-02-02 ENCOUNTER — Telehealth (HOSPITAL_COMMUNITY): Payer: Self-pay

## 2019-02-02 NOTE — Telephone Encounter (Signed)
I called pt to check in, he canceled his ECHO and provider appoint for tomor due to the covid-19. He is still not comfortable in going to the hosp for evaluations. He hasnt let me come out either for home visit due to the covid. I have been speaking to him over the phone for check ups. He is monitoring his b/p at home. Denies any issues with getting his meds or taking them. Pt says he would be willing for me to come out next week, so I will call him Monday to sch a visit.    Marylouise Stacks, EMT-Paramedic  02/02/19

## 2019-02-03 ENCOUNTER — Encounter (HOSPITAL_COMMUNITY): Payer: Medicaid Other | Admitting: Internal Medicine

## 2019-02-03 ENCOUNTER — Ambulatory Visit (HOSPITAL_COMMUNITY): Payer: Medicaid Other

## 2019-02-16 ENCOUNTER — Other Ambulatory Visit (HOSPITAL_COMMUNITY): Payer: Self-pay

## 2019-02-16 NOTE — Progress Notes (Signed)
Paramedicine Encounter    Patient ID: Randy [name], male    DOB: 02/11/1961, 58 y.o.   MRN: [id number]   Patient Care Team: Melene PlanKim, Rachel E, MD as PCP - General (Family Medicine) Bensimhon, Bevelyn Bucklesaniel R, MD as PCP - Cardiology (Cardiology) Deborha PaymentSimmons, Brittainy M, PA-C as Physician Assistant (Cardiology) Marvel PlanXu, Jindong, MD as Consulting Physician (Neurology) Burna SisUris, Jenna H, LCSW as Social Worker (Licensed Clinical Social Worker)  Patient Active Problem List   Diagnosis Date Noted  . Acute on chronic systolic heart failure (HCC) 07/01/2018  . Chronic kidney disease (CKD), stage III (moderate) (HCC) 07/01/2018  . CAD (coronary atherosclerotic disease) 07/01/2018  . H/O ETOH abuse 07/01/2018  . Acute on chronic congestive heart failure (HCC)   . NSTEMI (non-ST elevated myocardial infarction) (HCC) 05/12/2018  . Acute on chronic systolic (congestive) heart failure (HCC)   . Carotid stenosis, right   . Acute kidney injury superimposed on CKD (HCC)   . History of tobacco abuse 10/23/2017  . Hyperkalemia 09/18/2017  . Systolic heart failure (HCC) 07/08/2017  . Acute congestive heart failure (HCC)   . Dyspnea 03/23/2017  . Cellulitis of right ankle 01/23/2017  . Muscle spasm 10/23/2016  . Insomnia 04/11/2016  . Orthopnea 03/31/2016  . 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 HTN (hypertension) 06/15/2014  . HLD (hyperlipidemia) 04/05/2014  . PVD (peripheral vascular disease) with claudication (HCC) 05/09/2013  . GERD (gastroesophageal reflux disease) 03/28/2013  . AAA (abdominal aortic aneurysm) without rupture (HCC) 10/28/2012  . Anxiety state 02/12/2012  . Occlusion and stenosis of carotid artery without mention of cerebral infarction 01/19/2012  . Pulmonary nodule 05/19/2011  . CAD, ARTERY BYPASS GRAFT 10/19/2008  . DISC DISEASE, LUMBAR 10/19/2008    Current Outpatient Medications:  .  aspirin EC 81 MG tablet, Take 1 tablet (81 mg total) by mouth daily.,  Disp: 30 tablet, Rfl: 2 .  atorvastatin (LIPITOR) 80 MG tablet, Take 1 tablet (80 mg total) by mouth every evening., Disp: 90 tablet, Rfl: 3 .  carvedilol (COREG) 3.125 MG tablet, Take 1 tablet (3.125 mg total) by mouth 2 (two) times daily., Disp: 60 tablet, Rfl: 11 .  citalopram (CELEXA) 20 MG tablet, Take 1 tablet (20 mg total) by mouth daily., Disp: 90 tablet, Rfl: 0 .  clopidogrel (PLAVIX) 75 MG tablet, Take 1 tablet (75 mg total) by mouth daily with breakfast., Disp: 30 tablet, Rfl: 5 .  digoxin (LANOXIN) 0.125 MG tablet, Take 1 tablet (0.125 mg total) by mouth daily., Disp: 30 tablet, Rfl: 5 .  ezetimibe (ZETIA) 10 MG tablet, Take 1 tablet (10 mg total) by mouth daily., Disp: 30 tablet, Rfl: 5 .  hydrALAZINE (APRESOLINE) 100 MG tablet, Take 1 tablet (100 mg total) by mouth every 8 (eight) hours. (Patient taking differently: Take 100 mg by mouth every 8 (eight) hours. ), Disp: 90 tablet, Rfl: 5 .  isosorbide mononitrate (IMDUR) 60 MG 24 hr tablet, Take 1 tablet (60 mg total) by mouth daily., Disp: 30 tablet, Rfl: 5 .  lisinopril (PRINIVIL,ZESTRIL) 5 MG tablet, Take 1 tablet (5 mg total) by mouth daily., Disp: 30 tablet, Rfl: 11 .  potassium chloride SA (K-DUR,KLOR-CON) 20 MEQ tablet, Take 1 tablet (20 mEq total) by mouth daily., Disp: 60 tablet, Rfl: 3 .  nitroGLYCERIN (NITROSTAT) 0.4 MG SL tablet, Place 1 tablet (0.4 mg total) under the tongue every 5 (five) minutes x 3 doses as needed for chest pain. (Patient not taking: Reported on 09/02/2018), Disp: 25  tablet, Rfl: 12 .  spironolactone (ALDACTONE) 25 MG tablet, Take 1 tablet (25 mg total) by mouth daily., Disp: 30 tablet, Rfl: 5 .  torsemide (DEMADEX) 20 MG tablet, Take 3 tablets (60 mg total) by mouth daily., Disp: 120 tablet, Rfl: 5 Allergies  Allergen Reactions  . Entresto [Sacubitril-Valsartan] Nausea Only and Other (See Comments)    Dizziness and "sick on stomach"      Social History   Socioeconomic History  . Marital status:  Widowed    Spouse name: Not on file  . Number of children: 1  . Years of education: 9th  . Highest education level: Not on file  Occupational History  . Occupation: not working.   Social Needs  . Financial resource strain: Not very hard  . Food insecurity    Worry: Never true    Inability: Never true  . Transportation needs    Medical: No    Non-medical: No  Tobacco Use  . Smoking status: Former Smoker    Packs/day: 0.50    Years: 30.00    Pack years: 15.00    Types: Cigarettes    Quit date: 05/04/2018    Years since quitting: 0.7  . Smokeless tobacco: Never Used  Substance and Sexual Activity  . Alcohol use: Not Currently    Alcohol/week: 5.0 standard drinks    Types: 5 Cans of beer per week    Comment: occasionally  . Drug use: Yes    Types: Marijuana  . Sexual activity: Yes  Lifestyle  . Physical activity    Days per week: Not on file    Minutes per session: Not on file  . Stress: Not on file  Relationships  . Social Herbalist on phone: Not on file    Gets together: Not on file    Attends religious service: Not on file    Active member of club or organization: Not on file    Attends meetings of clubs or organizations: Not on file    Relationship status: Not on file  . Intimate partner violence    Fear of current or ex partner: Not on file    Emotionally abused: Not on file    Physically abused: Not on file    Forced sexual activity: Not on file  Other Topics Concern  . Not on file  Social History Narrative   Pt is widowed and lives alone.   Patient is right handed   Patient drinks about 2cups of caffeine daily.       Physical Exam      Future Appointments  Date Time Provider [place]  05/10/2019  1:00 PM [place] [facility] 1 [facility]  05/10/2019  2:00 PM Bensimhon, Shaune Pascal, MD [facility] None    BP (!) 180/106   Pulse 72   Temp (!) 97.3 F (36.3 C)   Resp 16   Wt 166 lb (75.3 kg)   SpO2 98%   BMI 23.15 kg/m   Weight  yesterday-166 Last visit weight-167-170  Pt reports he is doing great, no dizziness, no c/p, no light-headiness, no vision issues.  He has been keeping check on his b/p at home, he reports good numbers-he cant remember exact numbers but he states it was "normal".  His hydralazine he is taking twice a day--preference. Drinking on average 3 beers a day.  He drank 2 beers today at lunch and ate pizza.  He is adamant that nothing is wrong with him, his b/p  is high today. I have asked him to take his hydralazine three times a day. Against my advice he states he will continue this routine but will try to take the hydralazine TID. He has not been seen in months due to the COVID-19 and he did not want home visits. He still may not go his ECHO appoint in nov if things are still in question about the COVID.  I encouraged him to go at that time as that is an important test. I will come back in 2 wks for a b/p check and if taking hydralazine TID correcting his b/p, then he will be d/c from program.   Kerry Hough, EMT-Paramedic [phone] [facility] Paramedic  02/17/19

## 2019-03-01 ENCOUNTER — Telehealth (HOSPITAL_COMMUNITY): Payer: Self-pay

## 2019-03-01 NOTE — Telephone Encounter (Signed)
I called pt about home visit this week.  He reports he is fishing at the time and he prefers to sch home visit next week.  He reports he is trying to take the hydralazine TID but most days only BID. He states it hurts his stomach so he isnt going to take meds that makes him feel bad.  He reports taking his bp at home and it was around 138/88 or near that from what he remembers.  I will call him on Monday to sch day and time.    Marylouise Stacks, EMT-Paramedic  03/01/19

## 2019-03-21 ENCOUNTER — Other Ambulatory Visit (HOSPITAL_COMMUNITY): Payer: Self-pay

## 2019-03-21 MED ORDER — TORSEMIDE 20 MG PO TABS: 60.0000 mg | ORAL_TABLET | Freq: Every day | ORAL | 5 refills | Status: DC

## 2019-03-22 ENCOUNTER — Telehealth (HOSPITAL_COMMUNITY): Payer: Self-pay | Admitting: *Deleted

## 2019-03-22 ENCOUNTER — Telehealth (HOSPITAL_COMMUNITY): Payer: Self-pay

## 2019-03-22 NOTE — Telephone Encounter (Signed)
Katie w/paramedicine called and spoke with patient about scheduling home visit and medications. Pt told her he stopped his hydralazine 2 weeks ago because of sever stomach pain. Pain has stopped since he discontinued hydralazine. Katie asked pt about his bp and he said "its normal". Joellen Jersey will make home visit tomorrow and call our office with vitals.    Routed to Amy Clegg,NP

## 2019-03-22 NOTE — Telephone Encounter (Signed)
Agreed.  Thanks.  

## 2019-03-23 ENCOUNTER — Telehealth (HOSPITAL_COMMUNITY): Payer: Self-pay

## 2019-03-23 NOTE — Telephone Encounter (Signed)
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   Marylouise Stacks, EMT-Paramedic  03/23/19

## 2019-03-23 NOTE — Telephone Encounter (Signed)
I called pt regarding further appointments.  He states he is doing good.  He quit taking the hydralazine a couple wks ago due to him thinking it causing severe abd pain. At last visit he was only taking it twice a day.  I encouraged him to take it TID. Pt said after he stopped taking it he has felt better. He states he has been checking his b/p at home and he said they are "good numbers" so not sure what that means.  I will go out tomor afternoon for a b/p check and call clinic for further.   Marylouise Stacks, EMT-Paramedic 03/23/19

## 2019-03-24 ENCOUNTER — Telehealth (HOSPITAL_COMMUNITY): Payer: Self-pay

## 2019-03-24 NOTE — Telephone Encounter (Signed)
Pt called me to report he had to leave for dinner reservations soon and he would have to resch for Monday at 430-5.   Marylouise Stacks, EMT-Paramedic  03/24/19

## 2019-03-28 ENCOUNTER — Other Ambulatory Visit (HOSPITAL_COMMUNITY): Payer: Self-pay

## 2019-03-28 MED ORDER — CLOPIDOGREL BISULFATE 75 MG PO TABS: 75.0000 mg | ORAL_TABLET | Freq: Every day | ORAL | 5 refills | Status: DC

## 2019-03-28 NOTE — Progress Notes (Signed)
Paramedicine Encounter    Patient ID: Randy [name], male    DOB: April 13, 1961, 58 y.o.   MRN: [id number]   Patient Care Team: Wilber Oliphant, MD as PCP - General (Family Medicine) Bensimhon, Shaune Pascal, MD as PCP - Cardiology (Cardiology) Georgena Spurling as Physician Assistant (Cardiology) Rosalin Hawking, MD as Consulting Physician (Neurology) [name], LCSW as Social Worker (Licensed Clinical Social Worker)  Patient Active Problem List   Diagnosis Date Noted  . Acute on chronic systolic heart failure ([place]) 07/01/2018  . Chronic kidney disease (CKD), stage III (moderate) ([place]) 07/01/2018  . CAD (coronary atherosclerotic disease) 07/01/2018  . H/O ETOH abuse 07/01/2018  . Acute on chronic congestive heart failure ([place])   . NSTEMI (non-ST elevated myocardial infarction) ([place]) 05/12/2018  . Acute on chronic systolic (congestive) heart failure ([place])   . Carotid stenosis, right   . Acute kidney injury superimposed on CKD ([place])   . History of tobacco abuse 10/23/2017  . Hyperkalemia 09/18/2017  . Systolic heart failure ([place]) 07/08/2017  . Acute congestive heart failure ([place])   . Dyspnea 03/23/2017  . Cellulitis of right ankle 01/23/2017  . Muscle spasm 10/23/2016  . Insomnia 04/11/2016  . Orthopnea 03/31/2016  . 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 HTN (hypertension) 06/15/2014  . HLD (hyperlipidemia) 04/05/2014  . PVD (peripheral vascular disease) with claudication ([place]) 05/09/2013  . GERD (gastroesophageal reflux disease) 03/28/2013  . AAA (abdominal aortic aneurysm) without rupture ([place]) 10/28/2012  . Anxiety state 02/12/2012  . Occlusion and stenosis of carotid artery without mention of cerebral infarction 01/19/2012  . Pulmonary nodule 05/19/2011  . CAD, ARTERY BYPASS GRAFT 10/19/2008  . DISC DISEASE, LUMBAR 10/19/2008    Current Outpatient Medications:  .  aspirin EC 81 MG tablet, Take 1 tablet (81 mg total) by mouth daily.,  Disp: 30 tablet, Rfl: 2 .  atorvastatin (LIPITOR) 80 MG tablet, Take 1 tablet (80 mg total) by mouth every evening., Disp: 90 tablet, Rfl: 3 .  carvedilol (COREG) 3.125 MG tablet, Take 1 tablet (3.125 mg total) by mouth 2 (two) times daily., Disp: 60 tablet, Rfl: 11 .  citalopram (CELEXA) 20 MG tablet, Take 1 tablet (20 mg total) by mouth daily., Disp: 90 tablet, Rfl: 0 .  clopidogrel (PLAVIX) 75 MG tablet, Take 1 tablet (75 mg total) by mouth daily with breakfast., Disp: 30 tablet, Rfl: 5 .  digoxin (LANOXIN) 0.125 MG tablet, Take 1 tablet (0.125 mg total) by mouth daily., Disp: 30 tablet, Rfl: 5 .  ezetimibe (ZETIA) 10 MG tablet, Take 1 tablet (10 mg total) by mouth daily., Disp: 30 tablet, Rfl: 5 .  isosorbide mononitrate (IMDUR) 60 MG 24 hr tablet, Take 1 tablet (60 mg total) by mouth daily., Disp: 30 tablet, Rfl: 5 .  lisinopril (PRINIVIL,ZESTRIL) 5 MG tablet, Take 1 tablet (5 mg total) by mouth daily., Disp: 30 tablet, Rfl: 11 .  potassium chloride SA (K-DUR,KLOR-CON) 20 MEQ tablet, Take 1 tablet (20 mEq total) by mouth daily., Disp: 60 tablet, Rfl: 3 .  torsemide (DEMADEX) 20 MG tablet, Take 3 tablets (60 mg total) by mouth daily., Disp: 120 tablet, Rfl: 5 .  hydrALAZINE (APRESOLINE) 100 MG tablet, Take 1 tablet (100 mg total) by mouth every 8 (eight) hours. (Patient not taking: Reported on 03/28/2019), Disp: 90 tablet, Rfl: 5 .  nitroGLYCERIN (NITROSTAT) 0.4 MG SL tablet, Place 1 tablet (0.4 mg total) under the tongue every 5 (five) minutes x 3  doses as needed for chest pain. (Patient not taking: Reported on 09/02/2018), Disp: 25 tablet, Rfl: 12 .  spironolactone (ALDACTONE) 25 MG tablet, Take 1 tablet (25 mg total) by mouth daily., Disp: 30 tablet, Rfl: 5 Allergies  Allergen Reactions  . Entresto [Sacubitril-Valsartan] Nausea Only and Other (See Comments)    Dizziness and "sick on stomach"      Social History   Socioeconomic History  . Marital status: Widowed    Spouse name: Not on  file  . Number of children: 1  . Years of education: 9th  . Highest education level: Not on file  Occupational History  . Occupation: not working.   Social Needs  . Financial resource strain: Not very hard  . Food insecurity    Worry: Never true    Inability: Never true  . Transportation needs    Medical: No    Non-medical: No  Tobacco Use  . Smoking status: Former Smoker    Packs/day: 0.50    Years: 30.00    Pack years: 15.00    Types: Cigarettes    Quit date: 05/04/2018    Years since quitting: 0.9  . Smokeless tobacco: Never Used  Substance and Sexual Activity  . Alcohol use: Not Currently    Alcohol/week: 5.0 standard drinks    Types: 5 Cans of beer per week    Comment: occasionally  . Drug use: Yes    Types: Marijuana  . Sexual activity: Yes  Lifestyle  . Physical activity    Days per week: Not on file    Minutes per session: Not on file  . Stress: Not on file  Relationships  . Social Musician on phone: Not on file    Gets together: Not on file    Attends religious service: Not on file    Active member of club or organization: Not on file    Attends meetings of clubs or organizations: Not on file    Relationship status: Not on file  . Intimate partner violence    Fear of current or ex partner: Not on file    Emotionally abused: Not on file    Physically abused: Not on file    Forced sexual activity: Not on file  Other Topics Concern  . Not on file  Social History Narrative   Pt is widowed and lives alone.   Patient is right handed   Patient drinks about 2cups of caffeine daily.       Physical Exam      Future Appointments  Date Time Provider Department Center  05/10/2019  1:00 PM [facility] 1 [facility]  05/10/2019  2:00 PM Bensimhon, Bevelyn Buckles, MD [facility] None    BP (!) 170/110   Pulse 76   Temp 98 F (36.7 C)   Resp 18   Wt 166 lb (75.3 kg)   SpO2 99%   BMI 23.15 kg/m   Weight yesterday-166 Last visit  weight-166  Pt was laying down when I arrived, he reports he had abd pains last night and feels constipated. His LBM was yesterday.  he has not been taking his hydralazine-he reports that it is causing his abd pains and he is refusing to take them.  I suggested that since he is still having some abd pains and he is not taking that hydralazine anymore then it could be likely its not that medicine that is causing the pains but he is refusing that idea. He said  since he stopped taking that medicine his stomach has felt better and its not as bad.  no sob, his LBM was yesterday.  He denies dizziness, no c/p, no h/a.  He reports poor appetite however he ate 3 slices of pizza last night. He feels he is losing weight however his weight is the same as it was a month ago. His diet is filled with high sodium foods as he eats out about every meal of the day.  I spoke with him at length about better food choices, he doesn't have the time nor the desire to cook for himself.  His b/p is very high.  Due to the time of the day after clinic hours, I was not able to contact providers for further advice.  Will do so tomor.   Kerry Hough, EMT-Paramedic [phone] [facility] Paramedic  03/29/19

## 2019-03-31 ENCOUNTER — Other Ambulatory Visit (HOSPITAL_COMMUNITY): Payer: Self-pay

## 2019-03-31 MED ORDER — SPIRONOLACTONE 25 MG PO TABS: 25.0000 mg | ORAL_TABLET | Freq: Every day | ORAL | 5 refills | Status: DC

## 2019-04-12 ENCOUNTER — Telehealth (HOSPITAL_COMMUNITY): Payer: Self-pay

## 2019-04-12 NOTE — Telephone Encounter (Signed)
Called pt to follow up, no answer.   Marylouise Stacks, EMT-Paramedic  04/12/19

## 2019-04-18 ENCOUNTER — Telehealth (HOSPITAL_COMMUNITY): Payer: Self-pay

## 2019-04-19 NOTE — Telephone Encounter (Signed)
I spoke to patient today regarding future home visits by paramedicine, he states he is doing fine and no further visits necessary.  After last visit with his b/p elevated and I strongly encouraged him to take that hydralazine TID he refuses due to the abd pains it gave him, I spoke with Amy and right now there is nothing further to change with him on his meds. At last visit we reviewed his diet list and it contained alcohol, but he "doesn't drink as much as he used to", pizza, burgers etc so I cautioned him again on the high sodium foods and to avoid them.  He is going to do what he wants despite advice and if medications make him feel bad then he will not take them. I advised him to continue to check his b/p at home and if it has elevated readings continuously then he needs to contact the providers or myself. I encouraged him to keep his ECHO appoint however he is very reluctant to come due to [place]. I told him precautions are made at that time and again encouraged him to make that appointment.  Right now he can be d/c from paramedicine. He knows how to take his meds, get refills, no transportation needs. I advised him should he need further visits to let me know.   Marylouise Stacks, EMT-Paramedic  04/19/19

## 2019-05-04 ENCOUNTER — Other Ambulatory Visit: Payer: Self-pay | Admitting: Student in an Organized Health Care Education/Training Program

## 2019-05-04 DIAGNOSIS — I5020 Unspecified systolic (congestive) heart failure: Secondary | ICD-10-CM

## 2019-05-04 NOTE — Telephone Encounter (Signed)
Contacted pt and appointment scheduled virtually on 05/31/2019. April Zimmerman Rumple, CMA

## 2019-05-04 NOTE — Telephone Encounter (Signed)
Please call patient and schedule follow up appointment virtually. They have requested refill but have not been seen recently for chronic problem follow up. Please let them know that I will not be filling this medication again without follow up.   Wilber Oliphant, M.D.  PGY-2  Family Medicine  [phone] 05/04/2019 11:46 AM

## 2019-05-05 ENCOUNTER — Other Ambulatory Visit (HOSPITAL_COMMUNITY): Payer: Self-pay

## 2019-05-05 MED ORDER — ISOSORBIDE MONONITRATE ER 60 MG PO TB24: 60.0000 mg | ORAL_TABLET | Freq: Every day | ORAL | 5 refills | Status: DC

## 2019-05-10 ENCOUNTER — Ambulatory Visit (HOSPITAL_COMMUNITY): Payer: Medicaid Other

## 2019-05-10 ENCOUNTER — Other Ambulatory Visit: Payer: Self-pay

## 2019-05-10 ENCOUNTER — Ambulatory Visit (HOSPITAL_COMMUNITY)
Admission: RE | Admit: 2019-05-10 | Discharge: 2019-05-10 | Disposition: A | Discharge status: Home / Self Care | Payer: Medicaid Other | Source: Ambulatory Visit | Attending: Internal Medicine | Admitting: Internal Medicine

## 2019-05-10 DIAGNOSIS — I130 Hypertensive heart and chronic kidney disease with heart failure and stage 1 through stage 4 chronic kidney disease, or unspecified chronic kidney disease: Secondary | ICD-10-CM

## 2019-05-10 DIAGNOSIS — I2510 Atherosclerotic heart disease of native coronary artery without angina pectoris: Secondary | ICD-10-CM | POA: Diagnosis not present

## 2019-05-10 DIAGNOSIS — I10 Essential (primary) hypertension: Secondary | ICD-10-CM

## 2019-05-10 DIAGNOSIS — N1831 Chronic kidney disease, stage 3a: Secondary | ICD-10-CM | POA: Diagnosis not present

## 2019-05-10 DIAGNOSIS — I5022 Chronic systolic (congestive) heart failure: Secondary | ICD-10-CM

## 2019-05-10 DIAGNOSIS — F1010 Alcohol abuse, uncomplicated: Secondary | ICD-10-CM

## 2019-05-10 MED ORDER — LISINOPRIL 10 MG PO TABS: 10.0000 mg | ORAL_TABLET | Freq: Every day | ORAL | 11 refills | Status: DC

## 2019-05-10 NOTE — Patient Instructions (Signed)
Increase Lisinopril to 10 mg daily, a new prescription has been sent to your pharmacy for this  Randy [name], paramedicine will contact you to schedule an appointment for her to come by and check on you  Your physician recommends that you schedule a follow-up appointment in: 2-3 months with tele-health visit, our office will call you to schedule this.  If you have any questions or concerns before your next appointment please send [place] a message through [place] or call our office at [phone].

## 2019-05-10 NOTE — Progress Notes (Addendum)
Orders per Dr Vaughan Browner: 1. Increase lisinopril to 10mg  daily  2. Have katie from paramedicine see 1x/month  3. Telemed visit 2-3 months  4. In person appt when covid crisis less worrisome (refuses for now)      Spoke w/pt via phone, he is aware and agreeable to all, new rx sent to pharmacy for Lisinopril.  I called and spoke w/Katie and she will contact pt and sch appt to see him once a month.  Message sent to schedulers to arrange f/u appt.  AVS mailed to pt.

## 2019-05-10 NOTE — Progress Notes (Addendum)
Heart Failure TeleHealth Note  Due to national recommendations of social distancing due to COVID 19, Audio/video telehealth visit is felt to be most appropriate for this patient at this time.  See MyChart message from today for patient consent regarding telehealth for [facility].  Date:  05/10/2019   ID:  Randy [name], DOB 10-06-1960, MRN [id number]  Location: Home  Provider location: [facility] Type of Visit: Established patient  PCP:  Melene Plan, MD  Cardiologist:  Arvilla Meres, MD Primary HF: Bensimhon  Chief Complaint: Heart Failure follow-up   History of Present Illness:  Randy [name] is a 58 y.o. male with history of combined HF (diagnosed 03/2017), HTN, HLD, CAD s/p CABG 2010 and PCI (last ~2014), former tobacco use, CVA, and AAA.    Admitted 03/2017 with CHF exacerbation. Echo showed EF 30-35% with grade 2 DD. Refused cath at that time.   Quit smoking Jan 2019. Smoked for ~50 years prior 1 ppd. ETOH use (Bud Light) 18 pack/week, decreased from 18-24 beers daily. Has not smoked or had alcohol since 05/12/2018.   He was initially seen in the HF clinic June 2019. At that time he was set up for heart cath and switched to entresto. He was not on spiro with a history of hyperkalemia. He underwent LHC/RHC in July with 70-80% LM lesion.  Reduced EF was thought to be related to ETOH abuse.   Admitted to [facility] 05/12/2018 with increased dyspnea and chest pain. ECHO EF 15%. Diuresed with IV lasix but had poor response so milrinone was added. Diuresed over 20 pounds. Once adequately diuresed interventional cardiology took him to the cath lab and he underwent PCI DES to left main. Vascular consulted and repeated carotid U/S. This suggested total occlusion R ICA and 50-75% LICA. He did not require surgical intervention but will continue statin.  Admitted 1/2-07/03/18 with volume overload after stopping medications for 3 days. He was hypoxic on arrival  to ED. He diuresed with IV lasix. Repeat echo showed EF 20-25% (improved from 15%). He had a CT of chest that showed LLL nodule with recommendations of repeat CT vs PET in 3 months. DC weight: 168 lbs.   Today he returns for HF follow up. Last visit he was started on 3.125 mg carvedilol twice a day. Overall feeling fine. Denies SOB/PND/Orthopnea. No chest pain. Appetite ok. No fever or chills. Weight at home 169-170 pounds. Taking all medications. Followed by [facility]. Drinking about 2 beers a week  He presents via Web designer for a telehealth visit today. Says he feels good. Followed by [facility]amed[facility] Florentina Addison).  Denies CP. Can do all ADLs without problem. No edema, orthopnea or PND. Weight stable at 166 pounds. No tobacco. Still drinking a few (2-3) beers a few days per week . BP running high  Cardiac studies:   CT Chest 07/02/2018 1. Positive for a macrolobulated 1.7 x 1.6 x 1.5 cm low-density left lower lobe pulmonary nodule.    Echo 07/02/2018: - Left ventricle: The cavity size was mildly dilated. Wall thickness was normal. Systolic function was severely reduced. The estimated ejection fraction was in the range of 20% to 25%. Diffuse hypokinesis. Doppler parameters are consistent with a reversible restrictive pattern, indicative of decreased left ventricular diastolic compliance and/or increased left atrial pressure (grade 3 diastolic dysfunction). - Left atrium: The atrium was severely dilated. - Right ventricle: The cavity size was moderately dilated. Wall thickness was normal. Systolic function was moderately reduced. -  Right atrium: The atrium was severely dilated. - Pulmonary arteries: Systolic pressure was mildly increased. PA peak pressure: 37 mm Hg (S).   LHC 04/2018   Mid LM lesion is 90% stenosed.  Prox LAD lesion is 100% stenosed.  Ost 2nd Mrg to 2nd Mrg lesion is 100% stenosed.  2nd Mrg lesion is 99% stenosed.  Prox Cx to Mid Cx  lesion is 30% stenosed.  Ost RCA to Prox RCA lesion is 100% stenosed.  Mid Graft lesion is 30% stenosed.  Origin to Prox Graft lesion is 100% stenosed.  SVG.  Prox Graft lesion is 90% stenosed.  A drug-eluting stent was successfully placed using a STENT SYNERGY DES 3X16.  Post intervention, there is a 10% residual stenosis. 1. Severe stenosis of the native left mainstem, treated successfully with PCI using a 3.0 x 16 mm Synergy DES postdilated to high pressure with a 3.25 mm noncompliant balloon 2. Chronic occlusion of the LAD supplied by a widely patent LIMA graft 3. Severe stenosis of the saphenous vein graft to first OM with total occlusion of the OM branch just beyond the coronary anastomosis, unchanged from the previous study 4. Patent saphenous vein graft to first diagonal supplying collaterals to the obtuse marginal branch 5. Known chronic occlusion of the native RCA and saphenous vein graft RCA, not selectively injected Recommend uninterrupted dual antiplatelet therapy with Aspirin 81mg  daily and Clopidogrel 75mg  dailyfor a minimum of 12 months (ACS - Class I recommendation).   Review of systems complete and found to be negative unless listed in HPI.     Mellody DanceKeith Pohlmann denies symptoms worrisome for COVID 19.   Past Medical History:  Diagnosis Date  . AAA (abdominal aortic aneurysm) [facility](HCC) July 2013  . Alcohol abuse   . Anxiety   . CAD (coronary artery disease)    a. s/p CABG 2010.  . Carotid artery occlusion    a. prior R CEA, stenting of L carotid 2014. b. Severe carotid restenosis in 2017 -> planned for surgery but pt did not f/u.  [name]. Chronic combined systolic and diastolic CHF (congestive heart failure) (HCC)   . GERD (gastroesophageal reflux disease)   . Heart attack (HCC) 2010  . Hx of CABG 2010  . Hyperlipidemia   . Hypertension   . Lung nodule   . Polysubstance abuse (HCC)    a. mention of cocaine positivity in 2010 with patient stating unknowingly  exposed, also EtOH.  [name]. PVD (peripheral vascular disease) (HCC)    a. PAD s/p failed attempt at stenting of the left common iliac artery 2014.  . Stroke (HCC)   . Uncontrolled hypertension 02/02/2012   Past Surgical History:  Procedure Laterality Date  . ABDOMINAL AORTAGRAM N/A 04/19/2013   Procedure: ABDOMINAL AORTAGRAM;  Surgeon: Nada LibmanVance W Brabham, MD;  [facility];  Service: Cardiovascular;  Laterality: N/A;  . CAROTID ENDARTERECTOMY Left 01-13-13   Attempted cea  . CAROTID STENT INSERTION Left 02/22/2013   Procedure: CAROTID STENT INSERTION;  Surgeon: Nada LibmanVance W Brabham, MD;  [facility];  Service: Cardiovascular;  Laterality: Left;  . CORONARY ARTERY BYPASS GRAFT  2010  . CORONARY STENT INTERVENTION N/A 05/18/2018   Procedure: CORONARY STENT INTERVENTION;  Surgeon: Tonny Bollmanooper, Michael, MD;  [facility];  Service: Cardiovascular;  Laterality: N/A;  . CORONARY/GRAFT ANGIOGRAPHY N/A 05/18/2018   Procedure: CORONARY/GRAFT ANGIOGRAPHY;  Surgeon: Tonny Bollmanooper, Michael, MD;  [facility];  Service: Cardiovascular;  Laterality: N/A;  . ENDARTERECTOMY Left 01/13/2013  Procedure: ATTEMPTED ENDARTERECTOMY CAROTID;  Surgeon: Serafina Mitchell, MD;  Location: [place];  Service: Vascular;  Laterality: Left;  . RIGHT/LEFT HEART CATH AND CORONARY/GRAFT ANGIOGRAPHY N/A 12/29/2017   Procedure: RIGHT/LEFT HEART CATH AND CORONARY/GRAFT ANGIOGRAPHY;  Surgeon: Jolaine Artist, MD;  Location: [place] CV LAB;  Service: Cardiovascular;  Laterality: N/A;     Current Outpatient Medications  Medication Sig Dispense Refill  . aspirin EC 81 MG tablet Take 1 tablet (81 mg total) by mouth daily. 30 tablet 2  . atorvastatin (LIPITOR) 80 MG tablet Take 1 tablet (80 mg total) by mouth every evening. 90 tablet 3  . carvedilol (COREG) 3.125 MG tablet Take 1 tablet (3.125 mg total) by mouth 2 (two) times daily. 60 tablet 11  . citalopram (CELEXA) 20 MG tablet TAKE 1 TABLET BY MOUTH  EVERY DAY 90 tablet 0  . clopidogrel (PLAVIX) 75 MG tablet Take 1 tablet (75 mg total) by mouth daily with breakfast. 30 tablet 5  . digoxin (LANOXIN) 0.125 MG tablet Take 1 tablet (0.125 mg total) by mouth daily. 30 tablet 5  . ezetimibe (ZETIA) 10 MG tablet Take 1 tablet (10 mg total) by mouth daily. 30 tablet 5  . hydrALAZINE (APRESOLINE) 100 MG tablet Take 1 tablet (100 mg total) by mouth every 8 (eight) hours. (Patient not taking: Reported on 03/28/2019) 90 tablet 5  . isosorbide mononitrate (IMDUR) 60 MG 24 hr tablet Take 1 tablet (60 mg total) by mouth daily. 30 tablet 5  . lisinopril (PRINIVIL,ZESTRIL) 5 MG tablet Take 1 tablet (5 mg total) by mouth daily. 30 tablet 11  . nitroGLYCERIN (NITROSTAT) 0.4 MG SL tablet Place 1 tablet (0.4 mg total) under the tongue every 5 (five) minutes x 3 doses as needed for chest pain. (Patient not taking: Reported on 09/02/2018) 25 tablet 12  . potassium chloride SA (K-DUR,KLOR-CON) 20 MEQ tablet Take 1 tablet (20 mEq total) by mouth daily. 60 tablet 3  . spironolactone (ALDACTONE) 25 MG tablet Take 1 tablet (25 mg total) by mouth daily. 30 tablet 5  . torsemide (DEMADEX) 20 MG tablet Take 3 tablets (60 mg total) by mouth daily. 120 tablet 5   No current facility-administered medications for this encounter.     Allergies:   Entresto [sacubitril-valsartan]   Social History:  The patient  reports that he quit smoking about a year ago. His smoking use included cigarettes. He has a 15.00 pack-year smoking history. He has never used smokeless tobacco. He reports previous alcohol use of about 5.0 standard drinks of alcohol per week. He reports current drug use. Drug: Marijuana.   Family History:  The patient's family history includes Asthma in his mother; Cancer in his father; Diabetes in his mother; Hyperlipidemia in his mother; Hypertension in his mother; Lung cancer in his father; Stroke in his sister.   ROS:  Please see the history of present illness.   All  other systems are personally reviewed and negative.   Exam:  (Video/Tele Health Call; Exam is subjective and or/visual.) General:  Speaks in full sentences. No resp difficulty. Lungs: Normal respiratory effort with conversation.  Abdomen: Non-distended per patient report Extremities: Pt denies edema. Neuro: Alert & oriented x 3.   Recent Labs: 07/01/2018: ALT 146; B Natriuretic Peptide 4,024.5; Hemoglobin 12.6; Platelets 255 09/01/2018: BUN 16; Creatinine, Ser 1.59; Potassium 3.9; Sodium 135  Personally reviewed   Wt Readings from Last 3 Encounters:  03/28/19 75.3 kg (166 lb)  02/16/19 75.3 kg (166 lb)  09/27/18 78 kg (172 lb)      ASSESSMENT AND PLAN:  1. 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 ECHO in November down to 15%. Echo 06/2018 EF 20-25% - Stable NYHA II.   - Continue torsemide 80 mg daily + 40 meq K daily.  - Continue carvedilol to 6.25 mg twice a day.  - Continue digoxin 0.125 mg daily.  - Continue 25 mg spironolactone daily -  BP up. Will increase lisinopril to 10 daily. Intolerant entresto due to nausea.  - Continue hydralazine 100 mg three times a day + imdur 60 mg daily.  - Discussed possibility of ICD if EF remains low. Repeat ECHO in 3 months to decide on ICD (refuses to come to hospital with COVID crisis) - Consider Farxiga at next visit.  2. CAD s/p CABG 2010 -LHC7/2/19 showed patent LIMA to LAD, SVG to RCA occluded, SVG to OM-2 with 95% prox lesion. Has unprotected AV-groove LCX which feeds collaterals to distal RCA.  - 11/20 S/P DES to left main.  - Plan for dual antiplatelet therapy with asa mg daily + plavix for minimum of 12 months.  - No s/s ischemia -  On ASA, plavix, statin, b-blocker - Cannot tolerate zetia due to stomach pain. Needs lipids rechecked. If LDL > 70 can consider [facility]-9 - Refuses cardiac rehab.   3. Hx of Tobacco use - Quit smoking in November. No  change.  4. HTN - BP up. - Increase lisinopril  - Keep BP log   5. CKD Stage III - Baseline ~1.2-1.5 -Check BMET today   6. ?OSA - He snores heavily. - Referred for sleep study last appointment. Has refused.   7. Alcohol abuse - has cut back. Reinforced need to quit completely  8. Carotid Stenosis - Carotid US 04/14/16: Right ICA 80-99%, Left ICA upper range <50% vs low range >50%. Carotid U/S completed 04/2018 . This suggested total occlusion R ICA and 50-75% LICA. He did not require surgical intervention but will continue statin and follow up with [facility] in 1 year. No change.  9. LLL nodule - Noted on CT during admission. Will need follow up CT vs PET/CT in 3 months (April 2020). We discussed briefly.  - Repeat CT scan when he is willing to come in  10. CKD 3a - continue to follow  COVID screen The patient does not have any symptoms that suggest any further testing/ screening at this time.  Social distancing reinforced today.  Recommended follow-up:  As above  Relevant cardiac medications were reviewed at length with the patient today.   The patient does not have concerns regarding their medications at this time.   The following changes were made today:  As above  Today, I have spent 22  minutes with the patient with telehealth technology discussing the above issues .    Signed, Arvilla Meres, MD  05/10/2019 2:47 PM  [facility] [address] [facility] [place] [address] [phone] (office) [phone] (fax)

## 2019-05-10 NOTE — Addendum Note (Signed)
Encounter addended by: Scarlette Calico, RN on: 05/10/2019 4:44 PM  Actions taken: Order list changed, Clinical Note Signed

## 2019-05-23 ENCOUNTER — Ambulatory Visit: Payer: Medicaid Other

## 2019-05-23 ENCOUNTER — Telehealth (HOSPITAL_COMMUNITY): Payer: Self-pay

## 2019-05-23 NOTE — Telephone Encounter (Signed)
I spoke with pt to follow up on dr bensimhons request for him to be seen monthly  Due to holiday this week pt is agreeable for me to call him on Monday and f/u for a home visit next week.   Marylouise Stacks, EMT-Paramedic  05/23/19

## 2019-05-31 ENCOUNTER — Telehealth (INDEPENDENT_AMBULATORY_CARE_PROVIDER_SITE_OTHER): Payer: Medicaid Other | Admitting: Family Medicine

## 2019-05-31 ENCOUNTER — Other Ambulatory Visit (HOSPITAL_COMMUNITY): Payer: Self-pay

## 2019-05-31 ENCOUNTER — Other Ambulatory Visit: Payer: Self-pay

## 2019-05-31 ENCOUNTER — Encounter: Payer: Self-pay | Admitting: Family Medicine

## 2019-05-31 VITALS — BP 188/106 | Wt 153.0 lb

## 2019-05-31 DIAGNOSIS — I5022 Chronic systolic (congestive) heart failure: Secondary | ICD-10-CM

## 2019-05-31 DIAGNOSIS — I10 Essential (primary) hypertension: Secondary | ICD-10-CM

## 2019-05-31 DIAGNOSIS — F411 Generalized anxiety disorder: Secondary | ICD-10-CM | POA: Diagnosis not present

## 2019-05-31 DIAGNOSIS — E782 Mixed hyperlipidemia: Secondary | ICD-10-CM

## 2019-05-31 DIAGNOSIS — N1831 Chronic kidney disease, stage 3a: Secondary | ICD-10-CM

## 2019-05-31 DIAGNOSIS — I25810 Atherosclerosis of coronary artery bypass graft(s) without angina pectoris: Secondary | ICD-10-CM

## 2019-05-31 NOTE — Assessment & Plan Note (Signed)
188/106 today. Patient reports that this is normal for him at home. He reports compliance with his medications, low salt diet, no sodas, no smoking, very few ETOH.  Patient currently taking: Torsemide 60 mg daily, spironolactone 25 mg daily, lisinopril 10 mg daily, hydralazine 100 mg every 8 hours, carvedilol 6.25 mg twice daily.  Patient also takes Imdur 60 mg daily and digoxin 0.125 mg daily.  He has not had to use nitroglycerin since his hospitalization in January. I believe that patient is compliant with his medications.  He was doing his pillbox when I called him and was able to tell me all of his medications.  I have asked him to continue taking his blood pressure daily in the mornings and that I will call him back in about 1 week to check in on those numbers.  They will likely remain elevated.  I will reach out to Dr. Haroldine Laws to ask about any changes in cardiac medications that can be optimized for his blood pressure.

## 2019-05-31 NOTE — Progress Notes (Signed)
.[place] Telemedicine Visit  Patient consented to have virtual visit. Method of visit: Video was attempted, but technology challenges prevented patient from using video, so visit was conducted via telephone.  Encounter participants: Patient: Randy [name] - located at home Provider: Wilber Oliphant - located at [facility] Others (if applicable): none  Chief Complaint: Anxiety medication   HPI: Anxiety medication follow-up: Patient's virtual visit today was to review patient's medication list, problem list and reassess anxiety and citalopram efficacy.  Citalopram started for panic attacks in 06/2017. Patient has not had any panic attacks since.  He reports that he feels very good and does not feel anxious.  He certainly has anxiety over coronavirus pandemic and is being very careful to keep himself from being exposed.  This is resulted in him staying at home a lot and feeling depressed from time to time.  He reports that he overall is doing very well.  Alcohol use: Patient does not drink as much as he previously did. Thanksgiving weekend drank 2 beers.  Typically has 3-4 beers in one week, up to 2 beers at a time.   Smoking: Continues to be abstinent  ROS: per HPI Review of Systems  Constitutional: Negative for chills and fever.  HENT: Negative for congestion, sneezing, sore throat and trouble swallowing.   Eyes: Negative for visual disturbance.  Respiratory: Negative for cough, chest tightness and shortness of breath.   Cardiovascular: Negative for chest pain, palpitations and leg swelling.  Gastrointestinal: Negative for abdominal pain, constipation, diarrhea, nausea and vomiting.  Endocrine: Negative for polyuria.  Genitourinary: Negative for decreased urine volume and difficulty urinating.       No blood or frothy in urine  Musculoskeletal: Negative for back pain, joint swelling and neck pain.  Skin: Negative for wound.       No wounds on legs  Neurological:  Negative for dizziness, speech difficulty, weakness, light-headedness and headaches.  Psychiatric/Behavioral: Negative for agitation and sleep disturbance.   Pertinent PMHx: Cardiology: HFrEF (20-25% EF in Jan 2020),   Exam:  BP (!) 188/106   Wt 153 lb (69.4 kg)   BMI 21.34 kg/m  Respiratory: Breathing wnl. No obvious respiratory distress  Assessment/Plan: HTN (hypertension) 188/106 today. Patient reports that this is normal for him at home. He reports compliance with his medications, low salt diet, no sodas, no smoking, very few ETOH.  Patient currently taking: Torsemide 60 mg daily, spironolactone 25 mg daily, lisinopril 10 mg daily, hydralazine 100 mg every 8 hours, carvedilol 6.25 mg twice daily.  Patient also takes Imdur 60 mg daily and digoxin 0.125 mg daily.  He has not had to use nitroglycerin since his hospitalization in January. I believe that patient is compliant with his medications.  He was doing his pillbox when I called him and was able to tell me all of his medications.  I have asked him to continue taking his blood pressure daily in the mornings and that I will call him back in about 1 week to check in on those numbers.  They will likely remain elevated.  I will reach out to Dr. Haroldine Laws to ask about any changes in cardiac medications that can be optimized for his blood pressure.  HLD (hyperlipidemia) Continue atorvastatin and ezetimibe.  Last lipid panel in November 2019.  Chronic kidney disease (CKD), stage III (moderate) (HCC) Patient continues to have good response to 60 mg torsemide and 25 mg spironolactone daily.  He denies any changes in color or frequency  of his urine.   CAD (coronary atherosclerotic disease) On dual antiplatelet therapy with aspirin and Plavix.  CABG in November 2019. Can likely d/c plavix, will leave to cardiology.   Generalized anxiety disorder Is doing well with Celexa.  We will not make any changes at this time.  Perhaps attempt to take off  after coronavirus given raised anxiety during pandemic.  Chronic HFrEF (heart failure with reduced ejection fraction) (HCC) Patient currently taking spironolactone 25mg , torsemide 60 mg daily and on potassium supplements.  He has a history of hyperkalemia.  It would be nice to get a BMP however, patient refusing to go to high risk areas such as hospital or clinic.  Follows with Dr. .  Plans per cardiology.    Time spent during visit with patient: 30 minutes  Follow up call in one week.   [name], M.D.  10:22 AM 05/31/2019

## 2019-05-31 NOTE — Assessment & Plan Note (Signed)
Patient continues to have good response to 60 mg torsemide and 25 mg spironolactone daily.  He denies any changes in color or frequency of his urine.

## 2019-05-31 NOTE — Progress Notes (Signed)
Paramedicine Encounter    Patient ID: Randy [name], male    DOB: 16-Dec-1960, 58 y.o.   MRN: [id number]   Patient Care Team: Melene Plan, MD as PCP - General (Family Medicine) Bensimhon, Bevelyn Buckles, MD as PCP - Cardiology (Cardiology) Deborha Payment as Physician Assistant (Cardiology) Marvel Plan, MD as Consulting Physician (Neurology)  Patient Active Problem List   Diagnosis Date Noted  . Chronic kidney disease (CKD), stage III (moderate) 07/01/2018  . CAD (coronary atherosclerotic disease) 07/01/2018  . H/O ETOH abuse 07/01/2018  . NSTEMI (non-ST elevated myocardial infarction) (HCC) 05/12/2018  . Carotid stenosis, right   . History of tobacco abuse 10/23/2017  . Hyperkalemia 09/18/2017  . Chronic HFrEF (heart failure with reduced ejection fraction) (HCC) 07/08/2017  . 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 HTN (hypertension) 06/15/2014  . HLD (hyperlipidemia) 04/05/2014  . PVD (peripheral vascular disease) with claudication (HCC) 05/09/2013  . AAA (abdominal aortic aneurysm) without rupture (HCC) 10/28/2012  . Generalized anxiety disorder 02/12/2012  . Occlusion and stenosis of carotid artery without mention of cerebral infarction 01/19/2012  . Pulmonary nodule 05/19/2011  . CAD, ARTERY BYPASS GRAFT 10/19/2008    Current Outpatient Medications:  .  aspirin EC 81 MG tablet, Take 1 tablet (81 mg total) by mouth daily., Disp: 30 tablet, Rfl: 2 .  atorvastatin (LIPITOR) 80 MG tablet, Take 1 tablet (80 mg total) by mouth every evening., Disp: 90 tablet, Rfl: 3 .  carvedilol (COREG) 3.125 MG tablet, Take 1 tablet (3.125 mg total) by mouth 2 (two) times daily. (Patient taking differently: Take 6.25 mg by mouth 2 (two) times daily. ), Disp: 60 tablet, Rfl: 11 .  citalopram (CELEXA) 20 MG tablet, TAKE 1 TABLET BY MOUTH EVERY DAY, Disp: 90 tablet, Rfl: 0 .  clopidogrel (PLAVIX) 75 MG tablet, Take 1 tablet (75 mg total) by mouth daily with  breakfast., Disp: 30 tablet, Rfl: 5 .  digoxin (LANOXIN) 0.125 MG tablet, Take 1 tablet (0.125 mg total) by mouth daily., Disp: 30 tablet, Rfl: 5 .  isosorbide mononitrate (IMDUR) 60 MG 24 hr tablet, Take 1 tablet (60 mg total) by mouth daily., Disp: 30 tablet, Rfl: 5 .  lisinopril (ZESTRIL) 10 MG tablet, Take 1 tablet (10 mg total) by mouth daily., Disp: 30 tablet, Rfl: 11 .  potassium chloride SA (K-DUR,KLOR-CON) 20 MEQ tablet, Take 1 tablet (20 mEq total) by mouth daily. (Patient taking differently: Take 20 mEq by mouth once. ), Disp: 60 tablet, Rfl: 3 .  spironolactone (ALDACTONE) 25 MG tablet, Take 1 tablet (25 mg total) by mouth daily., Disp: 30 tablet, Rfl: 5 .  torsemide (DEMADEX) 20 MG tablet, Take 3 tablets (60 mg total) by mouth daily., Disp: 120 tablet, Rfl: 5 .  ezetimibe (ZETIA) 10 MG tablet, Take 1 tablet (10 mg total) by mouth daily. (Patient not taking: Reported on 05/31/2019), Disp: 30 tablet, Rfl: 5 .  hydrALAZINE (APRESOLINE) 100 MG tablet, Take 1 tablet (100 mg total) by mouth every 8 (eight) hours. (Patient not taking: Reported on 05/31/2019), Disp: 90 tablet, Rfl: 5 .  nitroGLYCERIN (NITROSTAT) 0.4 MG SL tablet, Place 1 tablet (0.4 mg total) under the tongue every 5 (five) minutes x 3 doses as needed for chest pain. (Patient not taking: Reported on 09/02/2018), Disp: 25 tablet, Rfl: 12 Allergies  Allergen Reactions  . Entresto [Sacubitril-Valsartan] Nausea Only and Other (See Comments)    Dizziness and "sick on stomach"  Social History   Socioeconomic History  . Marital status: Widowed    Spouse name: Not on file  . Number of children: 1  . Years of education: 9th  . Highest education level: Not on file  Occupational History  . Occupation: not working.   Social Needs  . Financial resource strain: Not very hard  . Food insecurity    Worry: Never true    Inability: Never true  . Transportation needs    Medical: No    Non-medical: No  Tobacco Use  . Smoking  status: Former Smoker    Packs/day: 0.50    Years: 30.00    Pack years: 15.00    Types: Cigarettes    Quit date: 05/04/2018    Years since quitting: 1.0  . Smokeless tobacco: Never Used  Substance and Sexual Activity  . Alcohol use: Not Currently    Alcohol/week: 5.0 standard drinks    Types: 5 Cans of beer per week    Comment: occasionally  . Drug use: Yes    Types: Marijuana  . Sexual activity: Yes  Lifestyle  . Physical activity    Days per week: Not on file    Minutes per session: Not on file  . Stress: Not on file  Relationships  . Social Herbalist on phone: Not on file    Gets together: Not on file    Attends religious service: Not on file    Active member of club or organization: Not on file    Attends meetings of clubs or organizations: Not on file    Relationship status: Not on file  . Intimate partner violence    Fear of current or ex partner: Not on file    Emotionally abused: Not on file    Physically abused: Not on file    Forced sexual activity: Not on file  Other Topics Concern  . Not on file  Social History Narrative   Pt is widowed and lives alone.   Patient is right handed   Patient drinks about 2cups of caffeine daily.       Physical Exam      No future appointments.  BP 130/84   Pulse 62   Resp 16   Wt 153 lb (69.4 kg)   SpO2 98%   BMI 21.34 kg/m   Weight yesterday-? Last visit weight-166  Pt reports he is doing good.  He denies sob. No dizziness. No c/p.  His weight is down from a few months ago--166 to 153 No edema noted. He states he is not eating like he used to, appetite is ~eh sometimes.  Drinking has decreased a lot!  Smoking down a lot too.  Activity is great.  Takes his meds except the zetia and hydralazine--causes abd pains too bad so he quit those.  His b/p is much better. I noticed on his virtual PCP appoint this morning when he checked it with his home machine is was quite higher--he had recently taken his  meds at that time.  I compared his b/p home machine to what I got and it was within 6 points systolic/diastolic.  I noticed in his last clinic note he needs to have labs done.  He is still very hesitant to come inside but he said for labs he can be in and out quick and if there was a time with the less people he would go and continue the proper precautions. So I will call and ask about  the best time for him to come in.   Kerry Hough, EMT-Paramedic [phone] [facility] Paramedic  05/31/19

## 2019-05-31 NOTE — Assessment & Plan Note (Addendum)
Patient currently taking spironolactone 25mg , torsemide 60 mg daily and on potassium supplements.  He has a history of hyperkalemia.  It would be nice to get a BMP however, patient refusing to go to high risk areas such as hospital or clinic.  Follows with Dr. Haroldine Laws.  Plans per cardiology.

## 2019-05-31 NOTE — Assessment & Plan Note (Addendum)
On dual antiplatelet therapy with aspirin and Plavix.  CABG in November 2019. Can likely d/c plavix, will leave to cardiology.

## 2019-05-31 NOTE — Assessment & Plan Note (Signed)
Is doing well with Celexa.  We will not make any changes at this time.  Perhaps attempt to take off after coronavirus given raised anxiety during pandemic.

## 2019-05-31 NOTE — Assessment & Plan Note (Signed)
Continue atorvastatin and ezetimibe.  Last lipid panel in November 2019.

## 2019-06-03 ENCOUNTER — Ambulatory Visit (HOSPITAL_COMMUNITY)
Admission: RE | Admit: 2019-06-03 | Discharge: 2019-06-03 | Disposition: A | Discharge status: Home / Self Care | Payer: Medicaid Other | Source: Ambulatory Visit | Attending: Internal Medicine | Admitting: Internal Medicine

## 2019-06-03 ENCOUNTER — Other Ambulatory Visit: Payer: Self-pay

## 2019-06-03 DIAGNOSIS — I5022 Chronic systolic (congestive) heart failure: Secondary | ICD-10-CM | POA: Insufficient documentation

## 2019-06-03 LAB — BASIC METABOLIC PANEL
Anion gap: 11 (ref 5–15)
BUN: 19 mg/dL (ref 6–20)
CO2: 30 mmol/L (ref 22–32)
Calcium: 9.2 mg/dL (ref 8.9–10.3)
Chloride: 92 mmol/L — ABNORMAL LOW (ref 98–111)
Creatinine, Ser: 1.71 mg/dL — ABNORMAL HIGH (ref 0.61–1.24)
GFR calc Af Amer: 49 mL/min — ABNORMAL LOW (ref >60)
GFR calc non Af Amer: 43 mL/min — ABNORMAL LOW (ref >60)
Glucose, Bld: 140 mg/dL — ABNORMAL HIGH (ref 70–99)
Potassium: 4.1 mmol/L (ref 3.5–5.1)
Sodium: 133 mmol/L — ABNORMAL LOW (ref 135–145)

## 2019-06-15 ENCOUNTER — Other Ambulatory Visit (HOSPITAL_COMMUNITY): Payer: Self-pay

## 2019-06-15 NOTE — Progress Notes (Signed)
Paramedicine Encounter    Patient ID: Randy [name], male    DOB: 1960/07/27, 58 y.o.   MRN: [id number]   Patient Care Team: Melene Plan, MD as PCP - General (Family Medicine) Bensimhon, Bevelyn Buckles, MD as PCP - Cardiology (Cardiology) Deborha Payment as Physician Assistant (Cardiology) Marvel Plan, MD as Consulting Physician (Neurology)  Patient Active Problem List   Diagnosis Date Noted  . Chronic kidney disease (CKD), stage III (moderate) 07/01/2018  . CAD (coronary atherosclerotic disease) 07/01/2018  . H/O ETOH abuse 07/01/2018  . NSTEMI (non-ST elevated myocardial infarction) (HCC) 05/12/2018  . Carotid stenosis, right   . History of tobacco abuse 10/23/2017  . Hyperkalemia 09/18/2017  . Chronic HFrEF (heart failure with reduced ejection fraction) (HCC) 07/08/2017  . 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 HTN (hypertension) 06/15/2014  . HLD (hyperlipidemia) 04/05/2014  . PVD (peripheral vascular disease) with claudication (HCC) 05/09/2013  . AAA (abdominal aortic aneurysm) without rupture (HCC) 10/28/2012  . Generalized anxiety disorder 02/12/2012  . Occlusion and stenosis of carotid artery without mention of cerebral infarction 01/19/2012  . Pulmonary nodule 05/19/2011  . CAD, ARTERY BYPASS GRAFT 10/19/2008    Current Outpatient Medications:  .  aspirin EC 81 MG tablet, Take 1 tablet (81 mg total) by mouth daily., Disp: 30 tablet, Rfl: 2 .  carvedilol (COREG) 3.125 MG tablet, Take 1 tablet (3.125 mg total) by mouth 2 (two) times daily. (Patient taking differently: Take 6.25 mg by mouth 2 (two) times daily. ), Disp: 60 tablet, Rfl: 11 .  citalopram (CELEXA) 20 MG tablet, TAKE 1 TABLET BY MOUTH EVERY DAY, Disp: 90 tablet, Rfl: 0 .  clopidogrel (PLAVIX) 75 MG tablet, Take 1 tablet (75 mg total) by mouth daily with breakfast., Disp: 30 tablet, Rfl: 5 .  digoxin (LANOXIN) 0.125 MG tablet, Take 1 tablet (0.125 mg total) by mouth  daily., Disp: 30 tablet, Rfl: 5 .  isosorbide mononitrate (IMDUR) 60 MG 24 hr tablet, Take 1 tablet (60 mg total) by mouth daily., Disp: 30 tablet, Rfl: 5 .  lisinopril (ZESTRIL) 10 MG tablet, Take 1 tablet (10 mg total) by mouth daily., Disp: 30 tablet, Rfl: 11 .  potassium chloride SA (K-DUR,KLOR-CON) 20 MEQ tablet, Take 1 tablet (20 mEq total) by mouth daily. (Patient taking differently: Take 20 mEq by mouth once. ), Disp: 60 tablet, Rfl: 3 .  spironolactone (ALDACTONE) 25 MG tablet, Take 1 tablet (25 mg total) by mouth daily., Disp: 30 tablet, Rfl: 5 .  torsemide (DEMADEX) 20 MG tablet, Take 3 tablets (60 mg total) by mouth daily., Disp: 120 tablet, Rfl: 5 .  atorvastatin (LIPITOR) 80 MG tablet, Take 1 tablet (80 mg total) by mouth every evening., Disp: 90 tablet, Rfl: 3 .  ezetimibe (ZETIA) 10 MG tablet, Take 1 tablet (10 mg total) by mouth daily. (Patient not taking: Reported on 05/31/2019), Disp: 30 tablet, Rfl: 5 .  hydrALAZINE (APRESOLINE) 100 MG tablet, Take 1 tablet (100 mg total) by mouth every 8 (eight) hours. (Patient not taking: Reported on 05/31/2019), Disp: 90 tablet, Rfl: 5 .  nitroGLYCERIN (NITROSTAT) 0.4 MG SL tablet, Place 1 tablet (0.4 mg total) under the tongue every 5 (five) minutes x 3 doses as needed for chest pain. (Patient not taking: Reported on 09/02/2018), Disp: 25 tablet, Rfl: 12 Allergies  Allergen Reactions  . Entresto [Sacubitril-Valsartan] Nausea Only and Other (See Comments)    Dizziness and "sick on stomach"  Social History   Socioeconomic History  . Marital status: Widowed    Spouse name: Not on file  . Number of children: 1  . Years of education: 9th  . Highest education level: Not on file  Occupational History  . Occupation: not working.   Tobacco Use  . Smoking status: Former Smoker    Packs/day: 0.50    Years: 30.00    Pack years: 15.00    Types: Cigarettes    Quit date: 05/04/2018    Years since quitting: 1.1  . Smokeless tobacco: Never  Used  Substance and Sexual Activity  . Alcohol use: Not Currently    Alcohol/week: 5.0 standard drinks    Types: 5 Cans of beer per week    Comment: occasionally  . Drug use: Yes    Types: Marijuana  . Sexual activity: Yes  Other Topics Concern  . Not on file  Social History Narrative   Pt is widowed and lives alone.   Patient is right handed   Patient drinks about 2cups of caffeine daily.      Social Determinants of Health   Financial Resource Strain: Low Risk   . Difficulty of Paying Living Expenses: Not on file  Food Insecurity: No Food Insecurity  . Worried About Charity fundraiser in the Last Year: Not on file  . Ran Out of Food in the Last Year: Not on file  Transportation Needs: No Transportation Needs  . Lack of Transportation (Medical): Not on file  . Lack of Transportation (Non-Medical): Not on file  Physical Activity:   . Days of Exercise per Week: Not on file  . Minutes of Exercise per Session: Not on file  Stress:   . Feeling of Stress : Not on file  Social Connections:   . Frequency of Communication with Friends and Family: Not on file  . Frequency of Social Gatherings with Friends and Family: Not on file  . Attends Religious Services: Not on file  . Active Member of Clubs or Organizations: Not on file  . Attends Archivist Meetings: Not on file  . Marital Status: Not on file  Intimate Partner Violence:   . Fear of Current or Ex-Partner: Not on file  . Emotionally Abused: Not on file  . Physically Abused: Not on file  . Sexually Abused: Not on file    Physical Exam      No future appointments.  BP (!) 150/98   Pulse 60   Resp 16   Wt 167 lb (75.8 kg)   SpO2 97%   BMI 23.29 kg/m   Weight yesterday-? Last visit weight-153  Pt reports he is feeling good, he had a party on Sunday for his g/f for her bday and he partook in about 5-6 beers and smoked marijuana.  Pt denies sob. Pt denies dizziness, no c/p.  Didn't weigh  yesterday--last weight was 167 on Sunday.  He had been weighing on his carpet on the floor so the 153lb from last time could very much be incorrect due to that.  No edema noted.  He just took his meds within the past hour. Last visit his b/p was great.  last time we compared his home b/p machine to what I got and it was very close.  He has not used that home b/p machine since then. Will f/u in 3-4 wks.   Marylouise Stacks, [facility] Paramedic  06/15/19

## 2019-06-27 ENCOUNTER — Other Ambulatory Visit (HOSPITAL_COMMUNITY): Payer: Self-pay

## 2019-06-27 MED ORDER — DIGOXIN 125 MCG PO TABS: 0.1250 mg | ORAL_TABLET | Freq: Every day | ORAL | 3 refills | Status: DC

## 2019-07-18 ENCOUNTER — Other Ambulatory Visit (HOSPITAL_COMMUNITY): Payer: Self-pay

## 2019-07-18 NOTE — Progress Notes (Signed)
Paramedicine Encounter    Patient ID: Randy [name], male    DOB: July 21, 1960, 59 y.o.   MRN: [id number]   Patient Care Team: Wilber Oliphant, MD as PCP - General (Family Medicine) Bensimhon, Shaune Pascal, MD as PCP - Cardiology (Cardiology) Georgena Spurling as Physician Assistant (Cardiology) Rosalin Hawking, MD as Consulting Physician (Neurology)  Patient Active Problem List   Diagnosis Date Noted  . Chronic kidney disease (CKD), stage III (moderate) 07/01/2018  . CAD (coronary atherosclerotic disease) 07/01/2018  . H/O ETOH abuse 07/01/2018  . NSTEMI (non-ST elevated myocardial infarction) ([place]) 05/12/2018  . Carotid stenosis, right   . History of tobacco abuse 10/23/2017  . Hyperkalemia 09/18/2017  . Chronic HFrEF (heart failure with reduced ejection fraction) ([place]) 07/08/2017  . 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 HTN (hypertension) 06/15/2014  . HLD (hyperlipidemia) 04/05/2014  . PVD (peripheral vascular disease) with claudication ([place]) 05/09/2013  . AAA (abdominal aortic aneurysm) without rupture ([place]) 10/28/2012  . Generalized anxiety disorder 02/12/2012  . Occlusion and stenosis of carotid artery without mention of cerebral infarction 01/19/2012  . Pulmonary nodule 05/19/2011  . CAD, ARTERY BYPASS GRAFT 10/19/2008    Current Outpatient Medications:  .  aspirin EC 81 MG tablet, Take 1 tablet (81 mg total) by mouth daily., Disp: 30 tablet, Rfl: 2 .  atorvastatin (LIPITOR) 80 MG tablet, Take 1 tablet (80 mg total) by mouth every evening., Disp: 90 tablet, Rfl: 3 .  carvedilol (COREG) 3.125 MG tablet, Take 1 tablet (3.125 mg total) by mouth 2 (two) times daily. (Patient taking differently: Take 6.25 mg by mouth 2 (two) times daily. ), Disp: 60 tablet, Rfl: 11 .  citalopram (CELEXA) 20 MG tablet, TAKE 1 TABLET BY MOUTH EVERY DAY, Disp: 90 tablet, Rfl: 0 .  clopidogrel (PLAVIX) 75 MG tablet, Take 1 tablet (75 mg total) by mouth daily with  breakfast., Disp: 30 tablet, Rfl: 5 .  digoxin (LANOXIN) 0.125 MG tablet, Take 1 tablet (0.125 mg total) by mouth daily., Disp: 90 tablet, Rfl: 3 .  ezetimibe (ZETIA) 10 MG tablet, Take 1 tablet (10 mg total) by mouth daily. (Patient not taking: Reported on 05/31/2019), Disp: 30 tablet, Rfl: 5 .  hydrALAZINE (APRESOLINE) 100 MG tablet, Take 1 tablet (100 mg total) by mouth every 8 (eight) hours. (Patient not taking: Reported on 05/31/2019), Disp: 90 tablet, Rfl: 5 .  isosorbide mononitrate (IMDUR) 60 MG 24 hr tablet, Take 1 tablet (60 mg total) by mouth daily., Disp: 30 tablet, Rfl: 5 .  lisinopril (ZESTRIL) 10 MG tablet, Take 1 tablet (10 mg total) by mouth daily., Disp: 30 tablet, Rfl: 11 .  nitroGLYCERIN (NITROSTAT) 0.4 MG SL tablet, Place 1 tablet (0.4 mg total) under the tongue every 5 (five) minutes x 3 doses as needed for chest pain. (Patient not taking: Reported on 09/02/2018), Disp: 25 tablet, Rfl: 12 .  potassium chloride SA (K-DUR,KLOR-CON) 20 MEQ tablet, Take 1 tablet (20 mEq total) by mouth daily. (Patient taking differently: Take 20 mEq by mouth once. ), Disp: 60 tablet, Rfl: 3 .  spironolactone (ALDACTONE) 25 MG tablet, Take 1 tablet (25 mg total) by mouth daily., Disp: 30 tablet, Rfl: 5 .  torsemide (DEMADEX) 20 MG tablet, Take 3 tablets (60 mg total) by mouth daily., Disp: 120 tablet, Rfl: 5 Allergies  Allergen Reactions  . Entresto [Sacubitril-Valsartan] Nausea Only and Other (See Comments)    Dizziness and "sick on stomach"  Social History   Socioeconomic History  . Marital status: Widowed    Spouse name: Not on file  . Number of children: 1  . Years of education: 9th  . Highest education level: Not on file  Occupational History  . Occupation: not working.   Tobacco Use  . Smoking status: Former Smoker    Packs/day: 0.50    Years: 30.00    Pack years: 15.00    Types: Cigarettes    Quit date: 05/04/2018    Years since quitting: 1.2  . Smokeless tobacco: Never Used   Substance and Sexual Activity  . Alcohol use: Not Currently    Alcohol/week: 5.0 standard drinks    Types: 5 Cans of beer per week    Comment: occasionally  . Drug use: Yes    Types: Marijuana  . Sexual activity: Yes  Other Topics Concern  . Not on file  Social History Narrative   Pt is widowed and lives alone.   Patient is right handed   Patient drinks about 2cups of caffeine daily.      Social Determinants of Health   Financial Resource Strain:   . Difficulty of Paying Living Expenses: Not on file  Food Insecurity:   . Worried About Programme researcher, broadcasting/film/video in the Last Year: Not on file  . Ran Out of Food in the Last Year: Not on file  Transportation Needs:   . Lack of Transportation (Medical): Not on file  . Lack of Transportation (Non-Medical): Not on file  Physical Activity:   . Days of Exercise per Week: Not on file  . Minutes of Exercise per Session: Not on file  Stress:   . Feeling of Stress : Not on file  Social Connections:   . Frequency of Communication with Friends and Family: Not on file  . Frequency of Social Gatherings with Friends and Family: Not on file  . Attends Religious Services: Not on file  . Active Member of Clubs or Organizations: Not on file  . Attends Banker Meetings: Not on file  . Marital Status: Not on file  Intimate Partner Violence:   . Fear of Current or Ex-Partner: Not on file  . Emotionally Abused: Not on file  . Physically Abused: Not on file  . Sexually Abused: Not on file    Physical Exam      No future appointments.  BP (!) 174/100   Pulse 80   Temp (!) 97.5 F (36.4 C)   Resp 18   Wt 168 lb (76.2 kg)   SpO2 98%   BMI 23.43 kg/m   Weight yesterday-? Did not weigh  Last visit weight-167  He reports he is doing well. He denies sob, no dizziness, denies c/p. he has cut back on his drinking even more. Over the wknd he had only 2 beers which is a great improvement to what he was doing.  Appetite is good.  Not weighing every day but weighs a couple times a week.  He has not taken his meds this morning. He ran out last night and is going to get his meds this afternoon and get started back on his b/p meds.  He is going to check his b/p tomor about an hour after he takes his meds in the morning and call me to let me know.   Kerry Hough, EMT-Paramedic [phone] [facility] Paramedic  07/18/19

## 2019-07-19 ENCOUNTER — Emergency Department (HOSPITAL_COMMUNITY): Payer: Medicaid Other

## 2019-07-19 ENCOUNTER — Other Ambulatory Visit (HOSPITAL_COMMUNITY): Payer: Self-pay

## 2019-07-19 ENCOUNTER — Emergency Department (HOSPITAL_COMMUNITY)
Admission: EM | Admit: 2019-07-19 | Discharge: 2019-07-19 | Disposition: A | Discharge status: Home / Self Care | Payer: Medicaid Other | Attending: Emergency Medicine | Admitting: Emergency Medicine

## 2019-07-19 ENCOUNTER — Encounter: Payer: Self-pay | Admitting: Family Medicine

## 2019-07-19 ENCOUNTER — Encounter (HOSPITAL_COMMUNITY): Payer: Self-pay

## 2019-07-19 ENCOUNTER — Other Ambulatory Visit: Payer: Self-pay

## 2019-07-19 DIAGNOSIS — I739 Peripheral vascular disease, unspecified: Secondary | ICD-10-CM | POA: Insufficient documentation

## 2019-07-19 DIAGNOSIS — Z20822 Contact with and (suspected) exposure to covid-19: Secondary | ICD-10-CM | POA: Diagnosis not present

## 2019-07-19 DIAGNOSIS — I959 Hypotension, unspecified: Secondary | ICD-10-CM | POA: Diagnosis not present

## 2019-07-19 DIAGNOSIS — S3993XA Unspecified injury of pelvis, initial encounter: Secondary | ICD-10-CM | POA: Diagnosis not present

## 2019-07-19 DIAGNOSIS — Z7982 Long term (current) use of aspirin: Secondary | ICD-10-CM | POA: Diagnosis not present

## 2019-07-19 DIAGNOSIS — R0902 Hypoxemia: Secondary | ICD-10-CM | POA: Diagnosis not present

## 2019-07-19 DIAGNOSIS — N183 Chronic kidney disease, stage 3 unspecified: Secondary | ICD-10-CM | POA: Insufficient documentation

## 2019-07-19 DIAGNOSIS — I5042 Chronic combined systolic (congestive) and diastolic (congestive) heart failure: Secondary | ICD-10-CM | POA: Diagnosis not present

## 2019-07-19 DIAGNOSIS — S3991XA Unspecified injury of abdomen, initial encounter: Secondary | ICD-10-CM | POA: Diagnosis not present

## 2019-07-19 DIAGNOSIS — I130 Hypertensive heart and chronic kidney disease with heart failure and stage 1 through stage 4 chronic kidney disease, or unspecified chronic kidney disease: Secondary | ICD-10-CM | POA: Diagnosis not present

## 2019-07-19 DIAGNOSIS — Z79899 Other long term (current) drug therapy: Secondary | ICD-10-CM | POA: Diagnosis not present

## 2019-07-19 DIAGNOSIS — Z7901 Long term (current) use of anticoagulants: Secondary | ICD-10-CM | POA: Diagnosis not present

## 2019-07-19 DIAGNOSIS — Z041 Encounter for examination and observation following transport accident: Secondary | ICD-10-CM | POA: Diagnosis present

## 2019-07-19 DIAGNOSIS — S0990XA Unspecified injury of head, initial encounter: Secondary | ICD-10-CM | POA: Diagnosis not present

## 2019-07-19 DIAGNOSIS — S199XXA Unspecified injury of neck, initial encounter: Secondary | ICD-10-CM | POA: Diagnosis not present

## 2019-07-19 DIAGNOSIS — G44309 Post-traumatic headache, unspecified, not intractable: Secondary | ICD-10-CM | POA: Diagnosis not present

## 2019-07-19 DIAGNOSIS — R519 Headache, unspecified: Secondary | ICD-10-CM | POA: Diagnosis not present

## 2019-07-19 DIAGNOSIS — R55 Syncope and collapse: Secondary | ICD-10-CM | POA: Insufficient documentation

## 2019-07-19 DIAGNOSIS — I252 Old myocardial infarction: Secondary | ICD-10-CM | POA: Insufficient documentation

## 2019-07-19 DIAGNOSIS — R7402 Elevation of levels of lactic acid dehydrogenase (LDH): Secondary | ICD-10-CM | POA: Insufficient documentation

## 2019-07-19 DIAGNOSIS — I499 Cardiac arrhythmia, unspecified: Secondary | ICD-10-CM | POA: Diagnosis not present

## 2019-07-19 DIAGNOSIS — S299XXA Unspecified injury of thorax, initial encounter: Secondary | ICD-10-CM | POA: Diagnosis not present

## 2019-07-19 DIAGNOSIS — Z743 Need for continuous supervision: Secondary | ICD-10-CM | POA: Diagnosis not present

## 2019-07-19 DIAGNOSIS — Z951 Presence of aortocoronary bypass graft: Secondary | ICD-10-CM | POA: Insufficient documentation

## 2019-07-19 DIAGNOSIS — I714 Abdominal aortic aneurysm, without rupture: Secondary | ICD-10-CM | POA: Diagnosis not present

## 2019-07-19 DIAGNOSIS — Z87891 Personal history of nicotine dependence: Secondary | ICD-10-CM | POA: Diagnosis not present

## 2019-07-19 DIAGNOSIS — R42 Dizziness and giddiness: Secondary | ICD-10-CM | POA: Diagnosis not present

## 2019-07-19 DIAGNOSIS — R7401 Elevation of levels of liver transaminase levels: Secondary | ICD-10-CM | POA: Diagnosis not present

## 2019-07-19 LAB — COMPREHENSIVE METABOLIC PANEL
ALT: 15 U/L (ref 0–44)
AST: 22 U/L (ref 15–41)
Albumin: 3.6 g/dL (ref 3.5–5.0)
Alkaline Phosphatase: 89 U/L (ref 38–126)
Anion gap: 14 (ref 5–15)
BUN: 21 mg/dL — ABNORMAL HIGH (ref 6–20)
CO2: 25 mmol/L (ref 22–32)
Calcium: 9.2 mg/dL (ref 8.9–10.3)
Chloride: 92 mmol/L — ABNORMAL LOW (ref 98–111)
Creatinine, Ser: 2.27 mg/dL — ABNORMAL HIGH (ref 0.61–1.24)
GFR calc Af Amer: 35 mL/min — ABNORMAL LOW (ref >60)
GFR calc non Af Amer: 30 mL/min — ABNORMAL LOW (ref >60)
Glucose, Bld: 143 mg/dL — ABNORMAL HIGH (ref 70–99)
Potassium: 3.5 mmol/L (ref 3.5–5.1)
Sodium: 131 mmol/L — ABNORMAL LOW (ref 135–145)
Total Bilirubin: 1.3 mg/dL — ABNORMAL HIGH (ref 0.3–1.2)
Total Protein: 7.3 g/dL (ref 6.5–8.1)

## 2019-07-19 LAB — CDS SEROLOGY

## 2019-07-19 LAB — CBC
HCT: 51.7 % (ref 39.0–52.0)
Hemoglobin: 18 g/dL — ABNORMAL HIGH (ref 13.0–17.0)
MCH: 30.2 pg (ref 26.0–34.0)
MCHC: 34.8 g/dL (ref 30.0–36.0)
MCV: 86.7 fL (ref 80.0–100.0)
Platelets: 188 10*3/uL (ref 150–400)
RBC: 5.96 MIL/uL — ABNORMAL HIGH (ref 4.22–5.81)
RDW: 15.7 % — ABNORMAL HIGH (ref 11.5–15.5)
WBC: 4.3 10*3/uL (ref 4.0–10.5)
nRBC: 0 % (ref 0.0–0.2)

## 2019-07-19 LAB — I-STAT CHEM 8, ED
BUN: 26 mg/dL — ABNORMAL HIGH (ref 6–20)
Calcium, Ion: 0.91 mmol/L — ABNORMAL LOW (ref 1.15–1.40)
Chloride: 98 mmol/L (ref 98–111)
Creatinine, Ser: 2 mg/dL — ABNORMAL HIGH (ref 0.61–1.24)
Glucose, Bld: 133 mg/dL — ABNORMAL HIGH (ref 70–99)
HCT: 55 % — ABNORMAL HIGH (ref 39.0–52.0)
Hemoglobin: 18.7 g/dL — ABNORMAL HIGH (ref 13.0–17.0)
Potassium: 3.8 mmol/L (ref 3.5–5.1)
Sodium: 132 mmol/L — ABNORMAL LOW (ref 135–145)
TCO2: 24 mmol/L (ref 22–32)

## 2019-07-19 LAB — PROTIME-INR
INR: 1.1 (ref 0.8–1.2)
Prothrombin Time: 13.8 seconds (ref 11.4–15.2)

## 2019-07-19 LAB — TROPONIN I (HIGH SENSITIVITY): Troponin I (High Sensitivity): 16 ng/L (ref <18)

## 2019-07-19 LAB — POC SARS CORONAVIRUS 2 AG -  ED
SARS Coronavirus 2 Ag: NEGATIVE
SARS Coronavirus 2 Ag: NEGATIVE

## 2019-07-19 LAB — ETHANOL: Alcohol, Ethyl (B): <10 mg/dL (ref <10)

## 2019-07-19 LAB — SAMPLE TO BLOOD BANK

## 2019-07-19 LAB — LACTIC ACID, PLASMA: Lactic Acid, Venous: 4 mmol/L (ref 0.5–1.9)

## 2019-07-19 MED ORDER — ONDANSETRON HCL 4 MG/2ML IJ SOLN
INTRAMUSCULAR | Status: AC | PRN
  Administered 2019-07-19: 4 mg via INTRAVENOUS

## 2019-07-19 MED ORDER — ONDANSETRON HCL 4 MG/2ML IJ SOLN
INTRAMUSCULAR | Status: AC
  Filled 2019-07-19: qty 2
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
## 2019-07-19 NOTE — Progress Notes (Signed)
Orthopedic Tech Progress Note Patient Details:  Randy [name] Dec 25, 1960 [id number]  Patient ID: Randy [name], male   DOB: [birth year], 59 y.o.   MRN: [id number]   Randy [name] 07/19/2019, 1:25 PMLevel one Trauma

## 2019-07-19 NOTE — Progress Notes (Signed)
Spoke with Dr. Renette Butters with Radiology and there is a possible new lacunar R thalamic infarct seen on head CT. I spoke with Dr. Renaye Rakers and suggested calling a code stroke. He will reevaulate the patient and determine if a code stroke is indicated.   Randy [name], [facility]
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
## 2019-07-19 NOTE — ED Notes (Signed)
Extensive conversation had with patient and daughter Randy [name] via phone at bedside about risks of leaving AMA up to and including death. Pt adamant about leaving, despite medical/nursing counseling. BP 85 systolic on leaving, able to ambulate and dress self. Daughter en route to pick pt up.
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
## 2019-07-20 ENCOUNTER — Ambulatory Visit (HOSPITAL_COMMUNITY)
Admission: RE | Admit: 2019-07-20 | Discharge: 2019-07-20 | Disposition: A | Discharge status: Home / Self Care | Payer: Medicaid Other | Source: Ambulatory Visit | Attending: Family Medicine | Admitting: Family Medicine

## 2019-07-20 ENCOUNTER — Telehealth (HOSPITAL_COMMUNITY): Payer: Self-pay

## 2019-07-20 ENCOUNTER — Ambulatory Visit (INDEPENDENT_AMBULATORY_CARE_PROVIDER_SITE_OTHER): Payer: Medicaid Other | Admitting: Family Medicine

## 2019-07-20 ENCOUNTER — Other Ambulatory Visit: Payer: Self-pay

## 2019-07-20 VITALS — BP 128/78 | HR 64 | Wt 166.0 lb

## 2019-07-20 DIAGNOSIS — R9431 Abnormal electrocardiogram [ECG] [EKG]: Secondary | ICD-10-CM

## 2019-07-20 DIAGNOSIS — I4581 Long QT syndrome: Secondary | ICD-10-CM | POA: Insufficient documentation

## 2019-07-20 DIAGNOSIS — I5022 Chronic systolic (congestive) heart failure: Secondary | ICD-10-CM | POA: Diagnosis not present

## 2019-07-20 DIAGNOSIS — R0902 Hypoxemia: Secondary | ICD-10-CM | POA: Insufficient documentation

## 2019-07-20 HISTORY — DX: Hypoxemia: R09.02

## 2019-07-20 HISTORY — DX: Abnormal electrocardiogram (ECG) (EKG): R94.31
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
## 2019-07-20 NOTE — Assessment & Plan Note (Signed)
Repeat ECG from the emergency department which showed QT of 500 now reduced to 480s which is an improvement but we are still concerned given this patient's recent syncope, they did refuse admission but will follow closely outpatient with cardiology

## 2019-07-20 NOTE — Assessment & Plan Note (Signed)
Significant history of CHF with last echo showing 20 to 25% concerning in a patient with syncope, patient refuses inpatient admission but does say he will follow closely with Dr. [name] office.  We did full basic labs including dig level which are pending

## 2019-07-20 NOTE — Patient Instructions (Signed)
It was a pleasure to see you today! Thank you for choosing [facility] for your primary care. Randy [name] was seen for hospital follow-up for syncope. Come back to the clinic if you get any changes in your symptoms.  Today we talked about her concern for the significant risk of your life for not going into the hospital to be monitored.  You have had a syncopal event, he had concerning findings on your EKG have a significant heart history as well as low oxygen whenever you start walking around.  We discussed that you place more value on being at home if something happened to you and we want to try to support you and make that as safe as possible despite it being against our medical advice.  Were going to order some oxygen for you to use at home when you are walking around until your oxygen recovers, we are going to message Dr. Augustina Mood and are asking you to do the same thing today to see if they can get a heart monitor for you or if they think that is appropriate.  We also drew some labs to try and check on your metabolites to make sure that your blood levels are okay.  Please remember that you should not be driving until you are cleared so by cardiology.   Please bring all your medications to every doctors visit   Sign up for My Chart to have easy access to your labs results, and communication with your Primary care physician.     Please check-out at the front desk before leaving the clinic.     Best,  Dr. Marthenia Rolling FAMILY MEDICINE RESIDENT - PGY3 07/20/2019 12:02 PM

## 2019-07-20 NOTE — Assessment & Plan Note (Signed)
Patient desatted to mid 80s while ambulating, unsure if this is contributing to syncope but is certainly concern.  Patient does slightly refuse admission but does accept offer of home DME oxygen.  This is been ordered with critical care management on an urgent basis.
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
## 2019-07-21 ENCOUNTER — Ambulatory Visit (HOSPITAL_COMMUNITY)
Admission: RE | Admit: 2019-07-21 | Discharge: 2019-07-21 | Disposition: A | Discharge status: Home / Self Care | Payer: Medicaid Other | Source: Ambulatory Visit | Attending: Adult Health | Admitting: Adult Health

## 2019-07-21 ENCOUNTER — Other Ambulatory Visit (HOSPITAL_COMMUNITY): Payer: Self-pay

## 2019-07-21 ENCOUNTER — Encounter (HOSPITAL_COMMUNITY): Payer: Self-pay

## 2019-07-21 VITALS — BP 98/64 | HR 62 | Wt 169.0 lb

## 2019-07-21 DIAGNOSIS — Z8673 Personal history of transient ischemic attack (TIA), and cerebral infarction without residual deficits: Secondary | ICD-10-CM | POA: Insufficient documentation

## 2019-07-21 DIAGNOSIS — I130 Hypertensive heart and chronic kidney disease with heart failure and stage 1 through stage 4 chronic kidney disease, or unspecified chronic kidney disease: Secondary | ICD-10-CM | POA: Diagnosis not present

## 2019-07-21 DIAGNOSIS — I6522 Occlusion and stenosis of left carotid artery: Secondary | ICD-10-CM | POA: Diagnosis not present

## 2019-07-21 DIAGNOSIS — Z8349 Family history of other endocrine, nutritional and metabolic diseases: Secondary | ICD-10-CM | POA: Diagnosis not present

## 2019-07-21 DIAGNOSIS — Z7982 Long term (current) use of aspirin: Secondary | ICD-10-CM | POA: Insufficient documentation

## 2019-07-21 DIAGNOSIS — I428 Other cardiomyopathies: Secondary | ICD-10-CM | POA: Insufficient documentation

## 2019-07-21 DIAGNOSIS — N183 Chronic kidney disease, stage 3 unspecified: Secondary | ICD-10-CM | POA: Diagnosis not present

## 2019-07-21 DIAGNOSIS — Z8249 Family history of ischemic heart disease and other diseases of the circulatory system: Secondary | ICD-10-CM | POA: Insufficient documentation

## 2019-07-21 DIAGNOSIS — Z951 Presence of aortocoronary bypass graft: Secondary | ICD-10-CM | POA: Diagnosis not present

## 2019-07-21 DIAGNOSIS — Z87891 Personal history of nicotine dependence: Secondary | ICD-10-CM | POA: Diagnosis not present

## 2019-07-21 DIAGNOSIS — R0683 Snoring: Secondary | ICD-10-CM | POA: Insufficient documentation

## 2019-07-21 DIAGNOSIS — Z955 Presence of coronary angioplasty implant and graft: Secondary | ICD-10-CM | POA: Diagnosis not present

## 2019-07-21 DIAGNOSIS — I5022 Chronic systolic (congestive) heart failure: Secondary | ICD-10-CM

## 2019-07-21 DIAGNOSIS — I25810 Atherosclerosis of coronary artery bypass graft(s) without angina pectoris: Secondary | ICD-10-CM | POA: Insufficient documentation

## 2019-07-21 DIAGNOSIS — R911 Solitary pulmonary nodule: Secondary | ICD-10-CM | POA: Diagnosis not present

## 2019-07-21 DIAGNOSIS — I5042 Chronic combined systolic (congestive) and diastolic (congestive) heart failure: Secondary | ICD-10-CM | POA: Insufficient documentation

## 2019-07-21 DIAGNOSIS — E785 Hyperlipidemia, unspecified: Secondary | ICD-10-CM | POA: Insufficient documentation

## 2019-07-21 DIAGNOSIS — Z823 Family history of stroke: Secondary | ICD-10-CM | POA: Insufficient documentation

## 2019-07-21 DIAGNOSIS — Z7902 Long term (current) use of antithrombotics/antiplatelets: Secondary | ICD-10-CM | POA: Insufficient documentation

## 2019-07-21 DIAGNOSIS — N1831 Chronic kidney disease, stage 3a: Secondary | ICD-10-CM

## 2019-07-21 DIAGNOSIS — R5383 Other fatigue: Secondary | ICD-10-CM

## 2019-07-21 DIAGNOSIS — F419 Anxiety disorder, unspecified: Secondary | ICD-10-CM | POA: Insufficient documentation

## 2019-07-21 DIAGNOSIS — F1010 Alcohol abuse, uncomplicated: Secondary | ICD-10-CM

## 2019-07-21 DIAGNOSIS — I739 Peripheral vascular disease, unspecified: Secondary | ICD-10-CM | POA: Diagnosis not present

## 2019-07-21 DIAGNOSIS — Z801 Family history of malignant neoplasm of trachea, bronchus and lung: Secondary | ICD-10-CM | POA: Insufficient documentation

## 2019-07-21 DIAGNOSIS — I2510 Atherosclerotic heart disease of native coronary artery without angina pectoris: Secondary | ICD-10-CM

## 2019-07-21 DIAGNOSIS — R55 Syncope and collapse: Secondary | ICD-10-CM | POA: Insufficient documentation

## 2019-07-21 DIAGNOSIS — I252 Old myocardial infarction: Secondary | ICD-10-CM | POA: Insufficient documentation

## 2019-07-21 DIAGNOSIS — Z79899 Other long term (current) drug therapy: Secondary | ICD-10-CM | POA: Insufficient documentation

## 2019-07-21 DIAGNOSIS — F1911 Other psychoactive substance abuse, in remission: Secondary | ICD-10-CM | POA: Diagnosis not present

## 2019-07-21 DIAGNOSIS — R9431 Abnormal electrocardiogram [ECG] [EKG]: Secondary | ICD-10-CM | POA: Insufficient documentation

## 2019-07-21 DIAGNOSIS — I10 Essential (primary) hypertension: Secondary | ICD-10-CM

## 2019-07-21 DIAGNOSIS — Z833 Family history of diabetes mellitus: Secondary | ICD-10-CM | POA: Diagnosis not present

## 2019-07-21 LAB — COMPREHENSIVE METABOLIC PANEL
ALT: 11 IU/L (ref 0–44)
AST: 19 IU/L (ref 0–40)
Albumin/Globulin Ratio: 1.6 (ref 1.2–2.2)
Albumin: 4.1 g/dL (ref 3.8–4.9)
Alkaline Phosphatase: 89 IU/L (ref 39–117)
BUN/Creatinine Ratio: 11 (ref 10–24)
BUN: 26 mg/dL (ref 8–27)
Bilirubin Total: 0.9 mg/dL (ref 0.0–1.2)
CO2: 23 mmol/L (ref 20–29)
Calcium: 9.1 mg/dL (ref 8.6–10.2)
Chloride: 93 mmol/L — ABNORMAL LOW (ref 96–106)
Creatinine, Ser: 2.31 mg/dL — ABNORMAL HIGH (ref 0.76–1.27)
GFR calc Af Amer: 34 mL/min/{1.73_m2} — ABNORMAL LOW (ref >59)
GFR calc non Af Amer: 30 mL/min/{1.73_m2} — ABNORMAL LOW (ref >59)
Globulin, Total: 2.5 g/dL (ref 1.5–4.5)
Glucose: 87 mg/dL (ref 65–99)
Potassium: 4 mmol/L (ref 3.5–5.2)
Sodium: 133 mmol/L — ABNORMAL LOW (ref 134–144)
Total Protein: 6.6 g/dL (ref 6.0–8.5)

## 2019-07-21 LAB — DIGOXIN LEVEL: Digoxin, Serum: <0.4 ng/mL — ABNORMAL LOW (ref 0.5–0.9)

## 2019-07-21 LAB — MAGNESIUM: Magnesium: 2.1 mg/dL (ref 1.6–2.3)

## 2019-07-21 MED ORDER — TORSEMIDE 20 MG PO TABS: 20.0000 mg | ORAL_TABLET | Freq: Every day | ORAL | 3 refills | Status: DC

## 2019-07-21 NOTE — Progress Notes (Signed)
Patient Name:  Randy [name]       DOB: October 03, 1960      Height: 169    Weight: 5'10"  Office Name: [facility]         Referring Provider:Amy Clegg,NP   Today's Date:07/21/2019  Date:   STOP BANG RISK ASSESSMENT S (snore) Have you been told that you snore?     NO   T (tired) Are you often tired, fatigued, or sleepy during the day?   NO  O (obstruction) Do you stop breathing, choke, or gasp during sleep? NO   P (pressure) Do you have or are you being treated for high blood pressure? YES   B (BMI) Is your body index greater than 35 kg/m? NO   A (age) Are you 42 years old or older? YES   N (neck) Do you have a neck circumference greater than 16 inches?   NO   G (gender) Are you a male? YES   TOTAL STOP/BANG "YES" ANSWERS                                                                        For Office Use Only              Procedure Order Form    YES to 3+ Stop Bang questions OR two clinical symptoms - patient qualifies for WatchPAT (CPT 95800)     Submit: This Form + Patient Face Sheet + Clinical Note via CloudPAT or Fax: [phone]         Clinical Notes: Will consult Sleep Specialist and refer for management of therapy due to patient increased risk of Sleep Apnea. Ordering a sleep study due to the following two clinical symptoms: Excessive daytime sleepiness G47.10 / Gastroesophageal reflux K21.9 / Nocturia R35.1 / Morning Headaches G44.221 / Difficulty concentrating R41.840 / Memory problems or poor judgment G31.84 / Personality changes or irritability R45.4 / Loud snoring R06.83 / Depression F32.9 / Unrefreshed by sleep G47.8 / Impotence N52.9 / History of high blood pressure R03.0 / Insomnia G47.00    I understand that I am proceeding with a home sleep apnea test as ordered by my treating physician. I understand that untreated sleep apnea is a serious cardiovascular risk factor and it is my responsibility to perform the test and seek management for sleep  apnea. I will be contacted with the results and be managed for sleep apnea by a local sleep physician. I will be receiving equipment and further instructions from [facility]. I shall promptly ship back the equipment via the included mailing label. I understand my insurance will be billed for the test and as the patient I am responsible for any insurance related out-of-pocket costs incurred. I have been provided with written instructions and can call for additional video or telephonic instruction, with 24-hour availability of qualified personnel to answer any questions: Patient Help Desk [phone].  Patient Signature ______________________________________________________   Date______________________ Patient Telemedicine Verbal Consent

## 2019-07-21 NOTE — Progress Notes (Signed)
Paramedicine Encounter   Patient ID: Randy [name] , male,   DOB: 05/06/1961,58 y.o.,  MRN: [id number]   Met patient in clinic today with provider.   B/p-98/64 p-62 sp02-98 Weight @ clinic-169  Pt here for f/u from his syncopal episode the other day and he left AMA from hosp.  He states he feels much better, his b/p at home  EKG done today. When he was in hosp the day of his syncopal episode he was having runs of v. Bigeminy, yesterday the EKG looked more normal.  -no torsemide for 2 days, then decrease to 20mg daily.  -stop potassium Will f/u next week.   Katie Lynch, EMT-Paramedic [phone] 07/21/2019        
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
## 2019-07-21 NOTE — Patient Instructions (Signed)
STOP Potassium HOLD Torsemide then decrease Torsemide to 20 mg, one tab daily  Your provider has recommended that  you wear a Zio Patch for 3 days.  This monitor will record your heart rhythm for our review.  IF you have any symptoms while wearing the monitor please press the button.  If you have any issues with the patch or you notice a red or orange light on it please call the company at [phone].  Once you remove the patch please mail it back to the company as soon as possible so we can get the results.  Your physician has requested that you have an echocardiogram. Echocardiography is a painless test that uses sound waves to create images of your heart. It provides your doctor with information about the size and shape of your heart and how well your heart's chambers and valves are working. This procedure takes approximately one hour. There are no restrictions for this procedure.  Your physician recommends that you schedule a follow-up appointment in: 3 weeks with Amy Clegg,NP  Do the following things EVERYDAY: 1) Weigh yourself in the morning before breakfast. Write it down and keep it in a log. 2) Take your medicines as prescribed 3) Eat low salt foods--Limit salt (sodium) to 2000 mg per day.  4) Stay as active as you can everyday 5) Limit all fluids for the day to less than 2 liters  At the [facility], you and your health needs are our priority. As part of our continuing mission to provide you with exceptional heart care, we have created designated Provider Care Teams. These Care Teams include your primary Cardiologist (physician) and Advanced Practice Providers (APPs- Physician Assistants and Nurse Practitioners) who all work together to provide you with the care you need, when you need it.   You may see any of the following providers on your designated Care Team at your next follow up: [name] Dr Arvilla Meres . Dr Marca Ancona . Tonye Becket, NP . Robbie Lis,  PA . Karle Plumber, PharmD   Please be sure to bring in all your medications bottles to every appointment.

## 2019-07-21 NOTE — Progress Notes (Signed)
  Entered in error. See progress note by Tonye Becket NP

## 2019-07-22 ENCOUNTER — Telehealth: Payer: Self-pay | Admitting: Family Medicine

## 2019-07-22 NOTE — Chronic Care Management (AMB) (Signed)
  Care Management   Note  07/22/2019 Name: Kinnie Kaupp MRN: [id number] DOB: June 24, 1961  Guadalupe Nickless is a 59 y.o. year old male who is a primary care patient of Melene Plan, MD. I reached out to [employer] by phone today in response to a referral sent by Mr. Jashan Kastens's health plan.    Mr. Sleight was given information about care management services today including:  1. Care management services include personalized support from designated clinical staff supervised by his physician, including individualized plan of care and coordination with other care providers 2. 24/7 contact phone numbers for assistance for urgent and routine care needs. 3. The patient may stop care management services at any time by phone call to the office staff.  Patient agreed to services and verbal consent obtained.   Follow up plan: Telephone appointment with care management team member scheduled for:07/25/2019  Elisha Ponder, LPN Health Advisor, Embedded Care Coordination [facility] Care Management ??[email or web address] ??[phone]

## 2019-07-25 ENCOUNTER — Other Ambulatory Visit: Payer: Self-pay | Admitting: Family Medicine

## 2019-07-25 ENCOUNTER — Other Ambulatory Visit: Payer: Self-pay

## 2019-07-25 ENCOUNTER — Ambulatory Visit: Payer: Medicaid Other

## 2019-07-26 ENCOUNTER — Telehealth (HOSPITAL_COMMUNITY): Payer: Self-pay | Admitting: *Deleted

## 2019-07-26 ENCOUNTER — Other Ambulatory Visit (HOSPITAL_COMMUNITY): Payer: Self-pay

## 2019-07-26 NOTE — Telephone Encounter (Signed)
Katie with paramedicine called to report pts systolic bp sitting was 110 standing it was 82 but pt is asymptomatic. Per Amy Clegg,NP hold torsemide one day and then take it every other day. Katie aware.
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
## 2019-07-27 ENCOUNTER — Telehealth: Payer: Self-pay | Admitting: Family Medicine

## 2019-07-27 DIAGNOSIS — R413 Other amnesia: Secondary | ICD-10-CM | POA: Insufficient documentation

## 2019-07-27 DIAGNOSIS — G44309 Post-traumatic headache, unspecified, not intractable: Secondary | ICD-10-CM

## 2019-07-27 HISTORY — DX: Other amnesia: R41.3
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
## 2019-08-01 ENCOUNTER — Encounter: Payer: Self-pay | Admitting: Neurology

## 2019-08-01 ENCOUNTER — Encounter (HOSPITAL_COMMUNITY): Payer: Medicaid Other | Admitting: Internal Medicine

## 2019-08-01 ENCOUNTER — Other Ambulatory Visit: Payer: Self-pay

## 2019-08-01 ENCOUNTER — Other Ambulatory Visit (HOSPITAL_COMMUNITY): Payer: Medicaid Other

## 2019-08-01 ENCOUNTER — Ambulatory Visit: Payer: Medicaid Other | Admitting: Neurology

## 2019-08-01 VITALS — BP 109/80 | HR 58 | Temp 97.3°F | Ht 70.0 in | Wt 169.5 lb

## 2019-08-01 DIAGNOSIS — R4020 Unspecified coma: Secondary | ICD-10-CM | POA: Diagnosis not present

## 2019-08-01 DIAGNOSIS — F0781 Postconcussional syndrome: Secondary | ICD-10-CM | POA: Diagnosis not present

## 2019-08-01 DIAGNOSIS — I6521 Occlusion and stenosis of right carotid artery: Secondary | ICD-10-CM | POA: Diagnosis not present

## 2019-08-01 DIAGNOSIS — I6522 Occlusion and stenosis of left carotid artery: Secondary | ICD-10-CM
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
## 2019-08-04 ENCOUNTER — Inpatient Hospital Stay (HOSPITAL_COMMUNITY): Admission: RE | Admit: 2019-08-04 | Payer: Medicaid Other | Source: Ambulatory Visit

## 2019-08-04 ENCOUNTER — Ambulatory Visit (HOSPITAL_COMMUNITY): Payer: Medicaid Other

## 2019-08-08 ENCOUNTER — Other Ambulatory Visit: Payer: Self-pay | Admitting: *Deleted

## 2019-08-08 ENCOUNTER — Telehealth (HOSPITAL_COMMUNITY): Payer: Self-pay

## 2019-08-08 ENCOUNTER — Other Ambulatory Visit: Payer: No Typology Code available for payment source

## 2019-08-08 DIAGNOSIS — I6522 Occlusion and stenosis of left carotid artery: Secondary | ICD-10-CM

## 2019-08-08 DIAGNOSIS — I6521 Occlusion and stenosis of right carotid artery: Secondary | ICD-10-CM

## 2019-08-08 DIAGNOSIS — R4020 Unspecified coma: Secondary | ICD-10-CM

## 2019-08-08 NOTE — Telephone Encounter (Signed)
Called pt to make him aware of zio results. Pt thankful for the news. No further questions at this time.

## 2019-08-08 NOTE — Telephone Encounter (Signed)
-----   Message from Sherald Hess, NP sent at 08/08/2019 11:40 AM EST ----- Please call. Monitor results ok. No arrhythmias noted. No change.

## 2019-08-09 ENCOUNTER — Other Ambulatory Visit (HOSPITAL_COMMUNITY): Payer: Self-pay
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
## 2019-08-15 ENCOUNTER — Other Ambulatory Visit: Payer: Self-pay

## 2019-08-15 ENCOUNTER — Ambulatory Visit (INDEPENDENT_AMBULATORY_CARE_PROVIDER_SITE_OTHER): Payer: Medicaid Other | Admitting: Neurology

## 2019-08-15 DIAGNOSIS — R4020 Unspecified coma: Secondary | ICD-10-CM | POA: Diagnosis not present

## 2019-08-15 DIAGNOSIS — F0781 Postconcussional syndrome: Secondary | ICD-10-CM

## 2019-08-15 NOTE — Progress Notes (Signed)
   [facility]  EEG (ELECTROENCEPHALOGRAM) REPORT   STUDY DATE: 08/15/2019 PATIENT NAME: Randy [name] DOB: [birth year] MRN: [id number]  ORDERING CLINICIAN: Lisle Skillman A. Epimenio Foot, MD. PhD  TECHNOLOGIST: Elvis Coil, RPSGT TECHNIQUE: Electroencephalogram was recorded utilizing standard 10-20 system of lead placement and reformatted into average and bipolar montages.  RECORDING TIME: 25 minutes 44 seconds  CLINICAL INFORMATION: 59 year old man with episode off loss of consciousness  FINDINGS: A digital EEG was performed while the patient was awake and drowsy. While awake and most alert there was a 8-9 hz posterior dominant rhythm. Voltages and frequencies were symmetric.  There were no focal, lateralizing, epileptiform activity or seizures seen.  Photic stimulation had a normal driving response. Hyperventilation and recovery did not change the underlying rhythms. EKG channel shows normal sinus rhythm.  The patient briefly became drowsy but did not have definitive sleep.  IMPRESSION: This is a normal EEG while the patient was awake and drowsy   INTERPRETING PHYSICIAN:   Joshuah Minella A. Epimenio Foot, MD, PhD, [facility] Certified in Neurology, Clinical Neurophysiology, Sleep Medicine, Pain Medicine and Neuroimaging  [facility] [address] [place], [place] [address] [phone]

## 2019-08-16 ENCOUNTER — Ambulatory Visit (HOSPITAL_BASED_OUTPATIENT_CLINIC_OR_DEPARTMENT_OTHER)
Admission: RE | Admit: 2019-08-16 | Discharge: 2019-08-16 | Disposition: A | Discharge status: Home / Self Care | Payer: Medicaid Other | Source: Ambulatory Visit | Attending: Neurology | Admitting: Neurology

## 2019-08-16 ENCOUNTER — Ambulatory Visit (HOSPITAL_BASED_OUTPATIENT_CLINIC_OR_DEPARTMENT_OTHER)
Admission: RE | Admit: 2019-08-16 | Discharge: 2019-08-16 | Disposition: A | Discharge status: Home / Self Care | Payer: Medicaid Other | Source: Ambulatory Visit | Attending: Cardiology | Admitting: Cardiology

## 2019-08-16 ENCOUNTER — Other Ambulatory Visit (HOSPITAL_COMMUNITY): Payer: Self-pay

## 2019-08-16 ENCOUNTER — Other Ambulatory Visit: Payer: Self-pay

## 2019-08-16 ENCOUNTER — Telehealth: Payer: Self-pay | Admitting: *Deleted

## 2019-08-16 ENCOUNTER — Ambulatory Visit (HOSPITAL_COMMUNITY)
Admission: RE | Admit: 2019-08-16 | Discharge: 2019-08-16 | Disposition: A | Discharge status: Home / Self Care | Payer: Medicaid Other | Source: Ambulatory Visit | Attending: Adult Health | Admitting: Adult Health

## 2019-08-16 ENCOUNTER — Encounter (HOSPITAL_COMMUNITY): Payer: Self-pay

## 2019-08-16 VITALS — BP 140/88 | HR 61 | Wt 168.0 lb

## 2019-08-16 DIAGNOSIS — Z7901 Long term (current) use of anticoagulants: Secondary | ICD-10-CM | POA: Insufficient documentation

## 2019-08-16 DIAGNOSIS — I739 Peripheral vascular disease, unspecified: Secondary | ICD-10-CM | POA: Insufficient documentation

## 2019-08-16 DIAGNOSIS — I6522 Occlusion and stenosis of left carotid artery: Secondary | ICD-10-CM | POA: Diagnosis not present

## 2019-08-16 DIAGNOSIS — Z87891 Personal history of nicotine dependence: Secondary | ICD-10-CM | POA: Insufficient documentation

## 2019-08-16 DIAGNOSIS — E785 Hyperlipidemia, unspecified: Secondary | ICD-10-CM | POA: Diagnosis not present

## 2019-08-16 DIAGNOSIS — I5042 Chronic combined systolic (congestive) and diastolic (congestive) heart failure: Secondary | ICD-10-CM | POA: Diagnosis not present

## 2019-08-16 DIAGNOSIS — R0683 Snoring: Secondary | ICD-10-CM | POA: Insufficient documentation

## 2019-08-16 DIAGNOSIS — I428 Other cardiomyopathies: Secondary | ICD-10-CM | POA: Diagnosis not present

## 2019-08-16 DIAGNOSIS — I252 Old myocardial infarction: Secondary | ICD-10-CM | POA: Insufficient documentation

## 2019-08-16 DIAGNOSIS — R4020 Unspecified coma: Secondary | ICD-10-CM

## 2019-08-16 DIAGNOSIS — N183 Chronic kidney disease, stage 3 unspecified: Secondary | ICD-10-CM | POA: Insufficient documentation

## 2019-08-16 DIAGNOSIS — Z955 Presence of coronary angioplasty implant and graft: Secondary | ICD-10-CM | POA: Diagnosis not present

## 2019-08-16 DIAGNOSIS — R911 Solitary pulmonary nodule: Secondary | ICD-10-CM | POA: Insufficient documentation

## 2019-08-16 DIAGNOSIS — I130 Hypertensive heart and chronic kidney disease with heart failure and stage 1 through stage 4 chronic kidney disease, or unspecified chronic kidney disease: Secondary | ICD-10-CM | POA: Diagnosis not present

## 2019-08-16 DIAGNOSIS — Z8673 Personal history of transient ischemic attack (TIA), and cerebral infarction without residual deficits: Secondary | ICD-10-CM | POA: Diagnosis not present

## 2019-08-16 DIAGNOSIS — I6523 Occlusion and stenosis of bilateral carotid arteries: Secondary | ICD-10-CM | POA: Insufficient documentation

## 2019-08-16 DIAGNOSIS — Z7902 Long term (current) use of antithrombotics/antiplatelets: Secondary | ICD-10-CM | POA: Diagnosis not present

## 2019-08-16 DIAGNOSIS — R55 Syncope and collapse: Secondary | ICD-10-CM | POA: Insufficient documentation

## 2019-08-16 DIAGNOSIS — I2510 Atherosclerotic heart disease of native coronary artery without angina pectoris: Secondary | ICD-10-CM | POA: Insufficient documentation

## 2019-08-16 DIAGNOSIS — I6521 Occlusion and stenosis of right carotid artery: Secondary | ICD-10-CM

## 2019-08-16 DIAGNOSIS — R42 Dizziness and giddiness: Secondary | ICD-10-CM | POA: Insufficient documentation

## 2019-08-16 DIAGNOSIS — Z8249 Family history of ischemic heart disease and other diseases of the circulatory system: Secondary | ICD-10-CM | POA: Insufficient documentation

## 2019-08-16 DIAGNOSIS — I5022 Chronic systolic (congestive) heart failure: Secondary | ICD-10-CM

## 2019-08-16 DIAGNOSIS — Z951 Presence of aortocoronary bypass graft: Secondary | ICD-10-CM | POA: Diagnosis not present

## 2019-08-16 DIAGNOSIS — Z7982 Long term (current) use of aspirin: Secondary | ICD-10-CM | POA: Diagnosis not present

## 2019-08-16 DIAGNOSIS — Z79899 Other long term (current) drug therapy: Secondary | ICD-10-CM | POA: Diagnosis not present

## 2019-08-16 LAB — BASIC METABOLIC PANEL WITH GFR
Anion gap: 8 (ref 5–15)
BUN: 23 mg/dL — ABNORMAL HIGH (ref 6–20)
CO2: 25 mmol/L (ref 22–32)
Calcium: 9.1 mg/dL (ref 8.9–10.3)
Chloride: 99 mmol/L (ref 98–111)
Creatinine, Ser: 1.78 mg/dL — ABNORMAL HIGH (ref 0.61–1.24)
GFR calc Af Amer: 47 mL/min — ABNORMAL LOW
GFR calc non Af Amer: 41 mL/min — ABNORMAL LOW
Glucose, Bld: 95 mg/dL (ref 70–99)
Potassium: 5.2 mmol/L — ABNORMAL HIGH (ref 3.5–5.1)
Sodium: 132 mmol/L — ABNORMAL LOW (ref 135–145)

## 2019-08-16 LAB — DIGOXIN LEVEL: Digoxin Level: 0.4 ng/mL — ABNORMAL LOW (ref 0.8–2.0)

## 2019-08-16 NOTE — Progress Notes (Signed)
  Echocardiogram 2D Echocardiogram has been performed.  Divonte Senger G Mikaela Hilgeman 08/16/2019, 1:43 PM

## 2019-08-16 NOTE — Telephone Encounter (Signed)
-----   Message from Asa Lente, MD sent at 08/15/2019  6:35 PM EST ----- Please let him know that the EEG was normal.
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
## 2019-08-16 NOTE — Progress Notes (Signed)
Paramedicine Encounter   Patient ID: Randy [name] , male,   DOB: 02/25/1961,58 y.o.,  MRN: [id number]   Met patient in clinic today with provider.   B/p-160/88 p-61 sp02-98 Weight @ clinic-168  Pt had ECHO and [place] of carotid done today. Pt denies any other issues or symptoms. Waiting on results of ECHO and US carotids.  He has not been getting in that mid day dose of hydralazine-he said he would try to do better with that. No edema noted.  Will f/u next week.   Marylouise Stacks, [place] 08/16/2019

## 2019-08-16 NOTE — Patient Instructions (Signed)
Labs done today, we will notify you of abnormal results  Your physician recommends that you return for a FASTING lipid profile: in 1-2 weeks  Non-Cardiac CT scanning, (CAT scanning), is a noninvasive, special x-ray that produces cross-sectional images of the body using x-rays and a computer. CT scans help physicians diagnose and treat medical conditions. For some CT exams, a contrast material is used to enhance visibility in the area of the body being studied. CT scans provide greater clarity and reveal more details than regular x-ray exams.  Due in April 2021  Your physician recommends that you schedule a follow-up appointment in: 6-8 weeks  If you have any questions or concerns before your next appointment please send [place] a message through [place] or call our office at [phone].  At the [facility], you and your health needs are our priority. As part of our continuing mission to provide you with exceptional heart care, we have created designated Provider Care Teams. These Care Teams include your primary Cardiologist (physician) and Advanced Practice Providers (APPs- Physician Assistants and Nurse Practitioners) who all work together to provide you with the care you need, when you need it.   You may see any of the following providers on your designated Care Team at your next follow up: [name] Dr Arvilla Meres . Dr Marca Ancona . Tonye Becket, NP . Robbie Lis, PA . Karle Plumber, PharmD   Please be sure to bring in all your medications bottles to every appointment.

## 2019-08-16 NOTE — Telephone Encounter (Signed)
Called and spoke with pt about normal EEG results. Pt verbalized understanding.  

## 2019-08-17 ENCOUNTER — Telehealth: Payer: Self-pay | Admitting: *Deleted

## 2019-08-17 NOTE — Telephone Encounter (Signed)
-----   Message from Asa Lente, MD sent at 08/17/2019 10:23 AM EST ----- Please let him know the ultrasound was similar to his last one --- the right internal carotid artery is blocked and the left is 50% closed.      He should continue to followup with vascular surgery

## 2019-08-17 NOTE — Telephone Encounter (Signed)
Called and spoke with pt about results per Dr. Epimenio Foot note. He verbalized understanding.  He saw Pennie Rushing, MD yesterday with cardiology. They ran a couple tests and he is waiting to hear back about results. He will continue to follow with vascular surgery. Instructed him to call if he has any questions/concerns. He verbalized understanding.

## 2019-08-18 ENCOUNTER — Telehealth (HOSPITAL_COMMUNITY): Payer: Self-pay | Admitting: Licensed Clinical Social Worker

## 2019-08-18 NOTE — Telephone Encounter (Signed)
CSW received call from [employer] informing that pt never received his home sleep study that was ordered on 1/21- paramedic called [facility] to see if maybe pt missed a call from them to confirm order but they did not see patient in their system.  CSW sent message to clinic staff requesting referral be resent- clinic RN to resend today  CSW will continue to follow and assist as needed  Burna Sis, LCSW Clinical Social Worker [facility] Desk#: [phone] Cell#: [phone]

## 2019-08-18 NOTE — Telephone Encounter (Signed)
Order, OV note, stop bang and demographics all faxed to [facility] [employer] at [phone] via epic  

## 2019-08-25 ENCOUNTER — Other Ambulatory Visit (HOSPITAL_COMMUNITY): Payer: Self-pay

## 2019-08-25 ENCOUNTER — Other Ambulatory Visit: Payer: Self-pay | Admitting: Family Medicine

## 2019-08-25 ENCOUNTER — Ambulatory Visit: Payer: Self-pay

## 2019-08-25 DIAGNOSIS — I5020 Unspecified systolic (congestive) heart failure: Secondary | ICD-10-CM

## 2019-08-25 MED ORDER — CITALOPRAM HYDROBROMIDE 20 MG PO TABS: 20.0000 mg | ORAL_TABLET | Freq: Every day | ORAL | 0 refills | Status: DC
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
## 2019-08-25 NOTE — Chronic Care Management (AMB) (Signed)
  Chronic Care Management   Note  08/25/2019 Name: Randy [name] MRN: [id number] DOB: [birth year]  RN Case Manager received message from EMT Kerry Hough that the patient needs a refill on his citalopram 20mg  1 daily.  He has ran out.  I have forwarded the message to his provider.    Follow up plan: No further follow up needed by the RN Case Manager at this time.   RN, BSN, [facility] Care Management Coordinator [facility] Phone: [phone] Fax: [phone]

## 2019-08-30 ENCOUNTER — Other Ambulatory Visit (HOSPITAL_COMMUNITY): Payer: Medicaid Other

## 2019-09-01 ENCOUNTER — Other Ambulatory Visit (HOSPITAL_COMMUNITY): Payer: Self-pay

## 2019-09-01 ENCOUNTER — Telehealth (HOSPITAL_COMMUNITY): Payer: Self-pay

## 2019-09-01 MED ORDER — CARVEDILOL 12.5 MG PO TABS: 6.2500 mg | ORAL_TABLET | Freq: Two times a day (BID) | ORAL | 3 refills | Status: DC

## 2019-09-01 NOTE — Telephone Encounter (Signed)
Katie EMT-P reports pt was feeling bad on hydralazine. Per Robbie Lis PA-C pt to stop hydralazine and increase Carvedilol to 12.5mg  bid.

## 2019-09-01 NOTE — Progress Notes (Signed)
Paramedicine Encounter    Patient ID: Kurtiss Wence, male    DOB: 03/28/1961, 59 y.o.   MRN: [id number]   Patient Care Team: Melene Plan, MD as PCP - General (Family Medicine) Bensimhon, Bevelyn Buckles, MD as PCP - Cardiology (Cardiology) Allayne Butcher, PA-C as Physician Assistant (Cardiology) Marvel Plan, MD as Consulting Physician (Neurology) Melene Plan, MD (Family Medicine) Juanell Fairly, RN as [facility] Care Management Juanell Fairly, RN as Case Manager  Patient Active Problem List   Diagnosis Date Noted  . Memory change 07/27/2019  . Post-traumatic headache, not intractable 07/27/2019  . Hypoxia 07/20/2019  . Prolonged QT interval 07/20/2019  . Chronic kidney disease (CKD), stage III (moderate) 07/01/2018  . CAD (coronary atherosclerotic disease) 07/01/2018  . H/O ETOH abuse 07/01/2018  . NSTEMI (non-ST elevated myocardial infarction) (HCC) 05/12/2018  . Carotid stenosis, right   . History of tobacco abuse 10/23/2017  . Hyperkalemia 09/18/2017  . Chronic HFrEF (heart failure with reduced ejection fraction) (HCC) 07/08/2017  . 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 HTN (hypertension) 06/15/2014  . HLD (hyperlipidemia) 04/05/2014  . PVD (peripheral vascular disease) with claudication (HCC) 05/09/2013  . AAA (abdominal aortic aneurysm) without rupture (HCC) 10/28/2012  . Generalized anxiety disorder 02/12/2012  . Carotid occlusion, left 01/19/2012  . Pulmonary nodule 05/19/2011  . CAD, ARTERY BYPASS GRAFT 10/19/2008    Current Outpatient Medications:  .  aspirin EC 81 MG tablet, Take 1 tablet (81 mg total) by mouth daily., Disp: 30 tablet, Rfl: 2 .  carvedilol (COREG) 6.25 MG tablet, Take 6.25 mg by mouth 2 (two) times daily with a meal., Disp: , Rfl:  .  citalopram (CELEXA) 20 MG tablet, Take 1 tablet (20 mg total) by mouth daily., Disp: 90 tablet, Rfl: 0 .  clopidogrel (PLAVIX) 75 MG tablet, Take 1 tablet (75 mg  total) by mouth daily with breakfast., Disp: 30 tablet, Rfl: 5 .  digoxin (LANOXIN) 0.125 MG tablet, Take 1 tablet (0.125 mg total) by mouth daily., Disp: 90 tablet, Rfl: 3 .  isosorbide mononitrate (IMDUR) 60 MG 24 hr tablet, Take 1 tablet (60 mg total) by mouth daily., Disp: 30 tablet, Rfl: 5 .  lisinopril (ZESTRIL) 10 MG tablet, Take 1 tablet (10 mg total) by mouth daily., Disp: 30 tablet, Rfl: 11 .  torsemide (DEMADEX) 20 MG tablet, Take 1 tablet (20 mg total) by mouth daily., Disp: 30 tablet, Rfl: 3 .  atorvastatin (LIPITOR) 80 MG tablet, Take 1 tablet (80 mg total) by mouth every evening., Disp: 90 tablet, Rfl: 3 .  ezetimibe (ZETIA) 10 MG tablet, Take 1 tablet (10 mg total) by mouth daily. (Patient not taking: Reported on 08/09/2019), Disp: 30 tablet, Rfl: 5 .  hydrALAZINE (APRESOLINE) 100 MG tablet, Take 1 tablet (100 mg total) by mouth every 8 (eight) hours. (Patient not taking: Reported on 09/01/2019), Disp: 90 tablet, Rfl: 5 .  nitroGLYCERIN (NITROSTAT) 0.4 MG SL tablet, Place 1 tablet (0.4 mg total) under the tongue every 5 (five) minutes x 3 doses as needed for chest pain. (Patient not taking: Reported on 08/09/2019), Disp: 25 tablet, Rfl: 12 .  spironolactone (ALDACTONE) 25 MG tablet, Take 1 tablet (25 mg total) by mouth daily., Disp: 30 tablet, Rfl: 5 Allergies  Allergen Reactions  . Entresto [Sacubitril-Valsartan] Nausea Only and Other (See Comments)    Dizziness and "sick on stomach"      Social History   Socioeconomic History  . Marital status:  Unknown    Spouse name: Not on file  . Number of children: 1  . Years of education: 9th  . Highest education level: Not on file  Occupational History  . Occupation: not working.   Tobacco Use  . Smoking status: Former Smoker    Packs/day: 0.50    Years: 30.00    Pack years: 15.00    Types: Cigarettes    Quit date: 05/04/2018    Years since quitting: 1.3  . Smokeless tobacco: Never Used  Substance and Sexual Activity  . Alcohol  use: Yes    Comment: daily  . Drug use: Not Currently    Types: Marijuana  . Sexual activity: Yes  Other Topics Concern  . Not on file  Social History Narrative   Pt is widowed and lives alone.   Patient is right handed   Patient drinks about 2cups of caffeine daily.      Social Determinants of Health   Financial Resource Strain:   . Difficulty of Paying Living Expenses: Not on file  Food Insecurity:   . Worried About Charity fundraiser in the Last Year: Not on file  . Ran Out of Food in the Last Year: Not on file  Transportation Needs:   . Lack of Transportation (Medical): Not on file  . Lack of Transportation (Non-Medical): Not on file  Physical Activity:   . Days of Exercise per Week: Not on file  . Minutes of Exercise per Session: Not on file  Stress:   . Feeling of Stress : Not on file  Social Connections:   . Frequency of Communication with Friends and Family: Not on file  . Frequency of Social Gatherings with Friends and Family: Not on file  . Attends Religious Services: Not on file  . Active Member of Clubs or Organizations: Not on file  . Attends Archivist Meetings: Not on file  . Marital Status: Not on file  Intimate Partner Violence:   . Fear of Current or Ex-Partner: Not on file  . Emotionally Abused: Not on file  . Physically Abused: Not on file  . Sexually Abused: Not on file    Physical Exam      Future Appointments  Date Time Provider [place]  09/02/2019  9:30 AM [facility] LAB [facility] None  10/17/2019 10:40 AM Bensimhon, Shaune Pascal, MD [facility] None    BP (!) 150/98   Pulse 70   Resp 16   SpO2 98%   Weight yesterday-163 Last visit weight-162  Pt reports he is doing ok, he denies sob, no edema noted, he denies any more dizzy episodes and he refusing to take the hydralazine anymore.  He needs refill on atorvastatin.  He wants to move pharmacies to [place] to aide in the co-pay costs and get it waived.  He is not  taking ezetimibe. States it gives his stomach pains.  He is out of the citalopram.  He missed his lab appointment the other day-called to get that resch. He is going [place].  He has taken his home b/p a few times the past few wks and its been mostly in the 150s/90s and 2 good readings of 118/70, 130/82.    Spoke to [place], [place] and she advised for him to increase carvedilol to 12.5mg  BID as the lisinopril cannot be increased due to elevated potassium levels in the past.   Marylouise Stacks, [place] Paramedic  09/01/19

## 2019-09-02 ENCOUNTER — Ambulatory Visit (HOSPITAL_COMMUNITY)
Admission: RE | Admit: 2019-09-02 | Discharge: 2019-09-02 | Disposition: A | Discharge status: Home / Self Care | Payer: Medicaid Other | Source: Ambulatory Visit | Attending: Internal Medicine | Admitting: Internal Medicine

## 2019-09-02 ENCOUNTER — Telehealth (HOSPITAL_COMMUNITY): Payer: Self-pay

## 2019-09-02 ENCOUNTER — Other Ambulatory Visit: Payer: Self-pay

## 2019-09-02 DIAGNOSIS — E785 Hyperlipidemia, unspecified: Secondary | ICD-10-CM | POA: Insufficient documentation

## 2019-09-02 LAB — HEPATIC FUNCTION PANEL
ALT: 15 U/L (ref 0–44)
AST: 18 U/L (ref 15–41)
Albumin: 3.5 g/dL (ref 3.5–5.0)
Alkaline Phosphatase: 72 U/L (ref 38–126)
Bilirubin, Direct: 0.2 mg/dL (ref 0.0–0.2)
Indirect Bilirubin: 1.1 mg/dL — ABNORMAL HIGH (ref 0.3–0.9)
Total Bilirubin: 1.3 mg/dL — ABNORMAL HIGH (ref 0.3–1.2)
Total Protein: 6.9 g/dL (ref 6.5–8.1)

## 2019-09-02 LAB — LIPID PANEL
Cholesterol: 295 mg/dL — ABNORMAL HIGH (ref 0–200)
HDL: 43 mg/dL (ref >40)
LDL Cholesterol: 229 mg/dL — ABNORMAL HIGH (ref 0–99)
Total CHOL/HDL Ratio: 6.9 RATIO
Triglycerides: 115 mg/dL (ref <150)
VLDL: 23 mg/dL (ref 0–40)

## 2019-09-02 MED ORDER — CARVEDILOL 12.5 MG PO TABS: 12.5000 mg | ORAL_TABLET | Freq: Two times a day (BID) | ORAL | 3 refills | Status: AC

## 2019-09-02 NOTE — Telephone Encounter (Signed)
-----   Message from Amy D Clegg, NP sent at 09/01/2019  2:47 PM EST ----- Refer to EP for ICD. EF remains low.  

## 2019-09-02 NOTE — Telephone Encounter (Signed)
-----   Message from Allayne Butcher, [place] sent at 09/01/2019  9:09 PM EST ----- Regarding: Coreg dose Can you change coreg dose on new order to 12.5 mg bid. You ordered the 12.5 mg tablet but forgot to change the dosage. Still saying 6.125 mg bid (0.5 tablet). Thanks

## 2019-09-02 NOTE — Telephone Encounter (Signed)
Corrections made

## 2019-09-02 NOTE — Telephone Encounter (Signed)
Called to review results. lmom

## 2019-09-08 ENCOUNTER — Telehealth (HOSPITAL_COMMUNITY): Payer: Self-pay

## 2019-09-08 ENCOUNTER — Telehealth (HOSPITAL_COMMUNITY): Payer: Self-pay | Admitting: *Deleted

## 2019-09-08 ENCOUNTER — Other Ambulatory Visit (HOSPITAL_COMMUNITY): Payer: Self-pay | Admitting: *Deleted

## 2019-09-08 DIAGNOSIS — I5022 Chronic systolic (congestive) heart failure: Secondary | ICD-10-CM

## 2019-09-08 MED ORDER — ATORVASTATIN CALCIUM 80 MG PO TABS: 80.0000 mg | ORAL_TABLET | Freq: Every evening | ORAL | 3 refills | Status: AC

## 2019-09-08 NOTE — Telephone Encounter (Signed)
Katie w/paramedicine called to let us know pt needs refill of Atorvastatin, refill sent in.  She also reports that she just noticed his K was 5.5 on 2/16 and was supposed to be rechecked but never was, she states pt has still been taking spiro.  Unsure why lab was never rechecked but advised it should be checked asap, lab appt sch for tomorrow AM.

## 2019-09-08 NOTE — Telephone Encounter (Signed)
I called pt reporting I was running a little behind schedule, he request that we resch for next week-I did notice that pt had an elevated K in labs a few wks ago and it was missed somehow that he needed to get it rechecked to see if his spiro needs to be stopped.  so got him sch for labs in the morning. He will go for that.  Will f/u next week.  Also got his atorvastatin sent in [facility] so he can restart that.   Kerry Hough, EMT-Paramedic  09/08/19

## 2019-09-09 ENCOUNTER — Encounter (HOSPITAL_COMMUNITY): Payer: Self-pay

## 2019-09-09 ENCOUNTER — Other Ambulatory Visit (HOSPITAL_COMMUNITY): Payer: Medicaid Other

## 2019-09-09 ENCOUNTER — Ambulatory Visit (HOSPITAL_COMMUNITY)
Admission: RE | Admit: 2019-09-09 | Discharge: 2019-09-09 | Disposition: A | Discharge status: Home / Self Care | Payer: Medicaid Other | Source: Ambulatory Visit | Attending: Internal Medicine | Admitting: Internal Medicine

## 2019-09-09 ENCOUNTER — Telehealth (HOSPITAL_COMMUNITY): Payer: Self-pay

## 2019-09-09 ENCOUNTER — Other Ambulatory Visit: Payer: Self-pay

## 2019-09-09 DIAGNOSIS — I5022 Chronic systolic (congestive) heart failure: Secondary | ICD-10-CM | POA: Diagnosis not present

## 2019-09-09 DIAGNOSIS — E785 Hyperlipidemia, unspecified: Secondary | ICD-10-CM

## 2019-09-09 LAB — BASIC METABOLIC PANEL
Anion gap: 12 (ref 5–15)
BUN: 14 mg/dL (ref 6–20)
CO2: 28 mmol/L (ref 22–32)
Calcium: 9.1 mg/dL (ref 8.9–10.3)
Chloride: 96 mmol/L — ABNORMAL LOW (ref 98–111)
Creatinine, Ser: 1.81 mg/dL — ABNORMAL HIGH (ref 0.61–1.24)
GFR calc Af Amer: 46 mL/min — ABNORMAL LOW (ref >60)
GFR calc non Af Amer: 40 mL/min — ABNORMAL LOW (ref >60)
Glucose, Bld: 88 mg/dL (ref 70–99)
Potassium: 4.4 mmol/L (ref 3.5–5.1)
Sodium: 136 mmol/L (ref 135–145)

## 2019-09-09 NOTE — Telephone Encounter (Signed)
-----   Message from Sherald Hess, NP sent at 09/01/2019  2:47 PM EST ----- Refer to EP for ICD. EF remains low.

## 2019-09-09 NOTE — Telephone Encounter (Signed)
Got pt on phone. Review labs/ referrals/ echo. No further questions at this time. Referrals placed.

## 2019-09-13 ENCOUNTER — Other Ambulatory Visit (HOSPITAL_COMMUNITY): Payer: Self-pay

## 2019-09-13 NOTE — Progress Notes (Signed)
Paramedicine Encounter    Patient ID: Randy [name], male    DOB: 12-Jun-1961, 59 y.o.   MRN: [id number]   Patient Care Team: Melene Plan, MD as PCP - General (Family Medicine) Bensimhon, Bevelyn Buckles, MD as PCP - Cardiology (Cardiology) Allayne Butcher, PA-C as Physician Assistant (Cardiology) Marvel Plan, MD as Consulting Physician (Neurology) Melene Plan, MD (Family Medicine) Juanell Fairly, RN as [facility] Care Management Juanell Fairly, RN as Case Manager  Patient Active Problem List   Diagnosis Date Noted  . Memory change 07/27/2019  . Post-traumatic headache, not intractable 07/27/2019  . Hypoxia 07/20/2019  . Prolonged QT interval 07/20/2019  . Chronic kidney disease (CKD), stage III (moderate) 07/01/2018  . CAD (coronary atherosclerotic disease) 07/01/2018  . H/O ETOH abuse 07/01/2018  . NSTEMI (non-ST elevated myocardial infarction) (HCC) 05/12/2018  . Carotid stenosis, right   . History of tobacco abuse 10/23/2017  . Hyperkalemia 09/18/2017  . Chronic HFrEF (heart failure with reduced ejection fraction) (HCC) 07/08/2017  . 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 HTN (hypertension) 06/15/2014  . HLD (hyperlipidemia) 04/05/2014  . PVD (peripheral vascular disease) with claudication (HCC) 05/09/2013  . AAA (abdominal aortic aneurysm) without rupture (HCC) 10/28/2012  . Generalized anxiety disorder 02/12/2012  . Carotid occlusion, left 01/19/2012  . Pulmonary nodule 05/19/2011  . CAD, ARTERY BYPASS GRAFT 10/19/2008    Current Outpatient Medications:  .  aspirin EC 81 MG tablet, Take 1 tablet (81 mg total) by mouth daily., Disp: 30 tablet, Rfl: 2 .  carvedilol (COREG) 12.5 MG tablet, Take 1 tablet (12.5 mg total) by mouth 2 (two) times daily with a meal., Disp: 60 tablet, Rfl: 3 .  citalopram (CELEXA) 20 MG tablet, Take 1 tablet (20 mg total) by mouth daily., Disp: 90 tablet, Rfl: 0 .  clopidogrel (PLAVIX) 75 MG  tablet, Take 1 tablet (75 mg total) by mouth daily with breakfast., Disp: 30 tablet, Rfl: 5 .  digoxin (LANOXIN) 0.125 MG tablet, Take 1 tablet (0.125 mg total) by mouth daily., Disp: 90 tablet, Rfl: 3 .  isosorbide mononitrate (IMDUR) 60 MG 24 hr tablet, Take 1 tablet (60 mg total) by mouth daily., Disp: 30 tablet, Rfl: 5 .  lisinopril (ZESTRIL) 10 MG tablet, Take 1 tablet (10 mg total) by mouth daily., Disp: 30 tablet, Rfl: 11 .  torsemide (DEMADEX) 20 MG tablet, Take 1 tablet (20 mg total) by mouth daily., Disp: 30 tablet, Rfl: 3 .  atorvastatin (LIPITOR) 80 MG tablet, Take 1 tablet (80 mg total) by mouth every evening. (Patient not taking: Reported on 09/13/2019), Disp: 90 tablet, Rfl: 3 .  ezetimibe (ZETIA) 10 MG tablet, Take 1 tablet (10 mg total) by mouth daily. (Patient not taking: Reported on 08/09/2019), Disp: 30 tablet, Rfl: 5 .  nitroGLYCERIN (NITROSTAT) 0.4 MG SL tablet, Place 1 tablet (0.4 mg total) under the tongue every 5 (five) minutes x 3 doses as needed for chest pain. (Patient not taking: Reported on 08/09/2019), Disp: 25 tablet, Rfl: 12 .  spironolactone (ALDACTONE) 25 MG tablet, Take 1 tablet (25 mg total) by mouth daily., Disp: 30 tablet, Rfl: 5 Allergies  Allergen Reactions  . Entresto [Sacubitril-Valsartan] Nausea Only and Other (See Comments)    Dizziness and "sick on stomach"      Social History   Socioeconomic History  . Marital status: Unknown    Spouse name: Not on file  . Number of children: 1  . Years of education:  9th  . Highest education level: Not on file  Occupational History  . Occupation: not working.   Tobacco Use  . Smoking status: Former Smoker    Packs/day: 0.50    Years: 30.00    Pack years: 15.00    Types: Cigarettes    Quit date: 05/04/2018    Years since quitting: 1.3  . Smokeless tobacco: Never Used  Substance and Sexual Activity  . Alcohol use: Yes    Comment: daily  . Drug use: Not Currently    Types: Marijuana  . Sexual activity:  Yes  Other Topics Concern  . Not on file  Social History Narrative   Pt is widowed and lives alone.   Patient is right handed   Patient drinks about 2cups of caffeine daily.      Social Determinants of Health   Financial Resource Strain:   . Difficulty of Paying Living Expenses:   Food Insecurity:   . Worried About Charity fundraiser in the Last Year:   . Arboriculturist in the Last Year:   Transportation Needs:   . Film/video editor (Medical):   [name] Lack of Transportation (Non-Medical):   Physical Activity:   . Days of Exercise per Week:   . Minutes of Exercise per Session:   Stress:   . Feeling of Stress :   Social Connections:   . Frequency of Communication with Friends and Family:   . Frequency of Social Gatherings with Friends and Family:   . Attends Religious Services:   . Active Member of Clubs or Organizations:   . Attends Archivist Meetings:   [name] Marital Status:   Intimate Partner Violence:   . Fear of Current or Ex-Partner:   . Emotionally Abused:   [name] Physically Abused:   . Sexually Abused:     Physical Exam      Future Appointments  Date Time Provider [place]  09/30/2019 10:00 AM Evans Lance, MD [facility]  10/17/2019 10:40 AM Bensimhon, Shaune Pascal, MD [facility] None  10/28/2019  9:00 AM Hilty, Nadean Corwin, MD [facility]    BP 128/84   Pulse (!) 58   Temp 99.5 F (37.5 C)   Resp 16   Wt 164 lb (74.4 kg)   SpO2 98%   BMI 23.53 kg/m   Weight yesterday-?? Last visit weight-163  Pt reports he is feeling good, he denies sob, no c/p, no dizziness unless he stands up really fast.  He was referred to EP for ICD-pt told me if he had to stay in the hosp he did not want to do it.  [facility] his atorvastatin and he reports he will pick it up.  I told him he needed to restart that ASAP.  He is not taking the hydralazine.  -isosorbide, lisinopril, spiro, clopidogrel, carvedilol  -called [facility] to get those filled-he has all the rx except for the lisinopril.   -he has not started the increased dose of carvedilol, although that was relayed to him a couple times.  Pharmacy has correct dose of carvedilol so that will be started once he gets meds.   Marylouise Stacks, [facility] [facility] Paramedic  09/13/19

## 2019-09-22 ENCOUNTER — Other Ambulatory Visit (HOSPITAL_COMMUNITY): Payer: Self-pay

## 2019-09-22 NOTE — Progress Notes (Signed)
Paramedicine Encounter    Patient ID: Randy [name], male    DOB: June 18, 1961, 59 y.o.   MRN: [id number]   Patient Care Team: Melene Plan, MD as PCP - General (Family Medicine) Bensimhon, Bevelyn Buckles, MD as PCP - Cardiology (Cardiology) Allayne Butcher, PA-C as Physician Assistant (Cardiology) Marvel Plan, MD as Consulting Physician (Neurology) Melene Plan, MD (Family Medicine) Juanell Fairly, RN as [facility] Care Management Juanell Fairly, RN as Case Manager  Patient Active Problem List   Diagnosis Date Noted  . Memory change 07/27/2019  . Post-traumatic headache, not intractable 07/27/2019  . Hypoxia 07/20/2019  . Prolonged QT interval 07/20/2019  . Chronic kidney disease (CKD), stage III (moderate) 07/01/2018  . CAD (coronary atherosclerotic disease) 07/01/2018  . H/O ETOH abuse 07/01/2018  . NSTEMI (non-ST elevated myocardial infarction) (HCC) 05/12/2018  . Carotid stenosis, right   . History of tobacco abuse 10/23/2017  . Hyperkalemia 09/18/2017  . Chronic HFrEF (heart failure with reduced ejection fraction) (HCC) 07/08/2017  . 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 HTN (hypertension) 06/15/2014  . HLD (hyperlipidemia) 04/05/2014  . PVD (peripheral vascular disease) with claudication (HCC) 05/09/2013  . AAA (abdominal aortic aneurysm) without rupture (HCC) 10/28/2012  . Generalized anxiety disorder 02/12/2012  . Carotid occlusion, left 01/19/2012  . Pulmonary nodule 05/19/2011  . CAD, ARTERY BYPASS GRAFT 10/19/2008    Current Outpatient Medications:  .  aspirin EC 81 MG tablet, Take 1 tablet (81 mg total) by mouth daily., Disp: 30 tablet, Rfl: 2 .  atorvastatin (LIPITOR) 80 MG tablet, Take 1 tablet (80 mg total) by mouth every evening., Disp: 90 tablet, Rfl: 3 .  carvedilol (COREG) 12.5 MG tablet, Take 1 tablet (12.5 mg total) by mouth 2 (two) times daily with a meal., Disp: 60 tablet, Rfl: 3 .  citalopram (CELEXA) 20  MG tablet, Take 1 tablet (20 mg total) by mouth daily., Disp: 90 tablet, Rfl: 0 .  clopidogrel (PLAVIX) 75 MG tablet, Take 1 tablet (75 mg total) by mouth daily with breakfast., Disp: 30 tablet, Rfl: 5 .  digoxin (LANOXIN) 0.125 MG tablet, Take 1 tablet (0.125 mg total) by mouth daily., Disp: 90 tablet, Rfl: 3 .  isosorbide mononitrate (IMDUR) 60 MG 24 hr tablet, Take 1 tablet (60 mg total) by mouth daily., Disp: 30 tablet, Rfl: 5 .  lisinopril (ZESTRIL) 10 MG tablet, Take 1 tablet (10 mg total) by mouth daily., Disp: 30 tablet, Rfl: 11 .  torsemide (DEMADEX) 20 MG tablet, Take 1 tablet (20 mg total) by mouth daily., Disp: 30 tablet, Rfl: 3 .  ezetimibe (ZETIA) 10 MG tablet, Take 1 tablet (10 mg total) by mouth daily. (Patient not taking: Reported on 08/09/2019), Disp: 30 tablet, Rfl: 5 .  nitroGLYCERIN (NITROSTAT) 0.4 MG SL tablet, Place 1 tablet (0.4 mg total) under the tongue every 5 (five) minutes x 3 doses as needed for chest pain. (Patient not taking: Reported on 08/09/2019), Disp: 25 tablet, Rfl: 12 .  spironolactone (ALDACTONE) 25 MG tablet, Take 1 tablet (25 mg total) by mouth daily., Disp: 30 tablet, Rfl: 5 Allergies  Allergen Reactions  . Entresto [Sacubitril-Valsartan] Nausea Only and Other (See Comments)    Dizziness and "sick on stomach"      Social History   Socioeconomic History  . Marital status: Unknown    Spouse name: Not on file  . Number of children: 1  . Years of education: 9th  . Highest education level:  Not on file  Occupational History  . Occupation: not working.   Tobacco Use  . Smoking status: Former Smoker    Packs/day: 0.50    Years: 30.00    Pack years: 15.00    Types: Cigarettes    Quit date: 05/04/2018    Years since quitting: 1.3  . Smokeless tobacco: Never Used  Substance and Sexual Activity  . Alcohol use: Yes    Comment: daily  . Drug use: Not Currently    Types: Marijuana  . Sexual activity: Yes  Other Topics Concern  . Not on file  Social  History Narrative   Pt is widowed and lives alone.   Patient is right handed   Patient drinks about 2cups of caffeine daily.      Social Determinants of Health   Financial Resource Strain:   . Difficulty of Paying Living Expenses:   Food Insecurity:   . Worried About Programme researcher, broadcasting/film/video in the Last Year:   . Barista in the Last Year:   Transportation Needs:   . Freight forwarder (Medical):   [name] Lack of Transportation (Non-Medical):   Physical Activity:   . Days of Exercise per Week:   . Minutes of Exercise per Session:   Stress:   . Feeling of Stress :   Social Connections:   . Frequency of Communication with Friends and Family:   . Frequency of Social Gatherings with Friends and Family:   . Attends Religious Services:   . Active Member of Clubs or Organizations:   . Attends Banker Meetings:   [name] Marital Status:   Intimate Partner Violence:   . Fear of Current or Ex-Partner:   . Emotionally Abused:   [name] Physically Abused:   . Sexually Abused:     Physical Exam      Future Appointments  Date Time Provider Department Center  09/30/2019 10:00 AM Marinus Maw, MD [facility]  10/17/2019 10:40 AM Bensimhon, Bevelyn Buckles, MD [facility] None  10/24/2019  3:00 PM [facility]  10/28/2019  8:45 AM Hilty, Lisette Abu, MD [facility]    BP 92/62   Pulse 60   Temp 98.8 F (37.1 C)   Resp 16   SpO2 98%  B/p standing- 94/70 Weight yesterday-167 Last visit weight-164  Pt reports he is doing good. He has no issues or complaints at this time.  He denies c/p, no sob, no dizziness. No edema.  He will start the atorvastatin this week. He just p/u from pharmacy a few days ago.  meds verified.  He does not want to proceed with ICD but he is willing to go talk with Dr. Ladona Ridgel to listen to what he says and talk about possible other options.  B/p low as noted-he took meds around 8am. He denies dizziness when standing or at any other  times.  Will f/u next week.   Kerry Hough, EMT-Paramedic [phone] [facility] Paramedic  09/22/19

## 2019-09-26 ENCOUNTER — Telehealth: Payer: Self-pay | Admitting: *Deleted

## 2019-09-26 ENCOUNTER — Telehealth: Payer: Self-pay | Admitting: Licensed Clinical Social Worker

## 2019-09-26 NOTE — Telephone Encounter (Signed)
Fax came from [facility] stating after multiple attempts the above patient has not returned their calls to schedule his study. They will try one more time if no response they will void the order.

## 2019-09-26 NOTE — Telephone Encounter (Signed)
CSW consulted to help pt connect with [facility] so he can set up his sleep study.  CSW attempted to call pt so I could assist with connecting him with [facility] representative- no answer- left VM requesting return call  CSW then sent message to [facility] Paramedic to request potential in person visit this week if he is not reachable by phone so we can connect him with rep and get everything scheduled.  CSW will continue to follow and assist as needed  Burna Sis, LCSW Clinical Social Worker [facility] Desk#: [phone] Cell#: [phone]

## 2019-09-26 NOTE — Telephone Encounter (Signed)
We need to try and contact for sleep study. Can Paramedicine help with this?  Amy Clegg NP-C  4:06 PM

## 2019-09-27 ENCOUNTER — Other Ambulatory Visit (HOSPITAL_COMMUNITY): Payer: Self-pay

## 2019-09-27 NOTE — Progress Notes (Signed)
Paramedicine Encounter    Patient ID: Randy [name], male    DOB: June 18, 1961, 59 y.o.   MRN: [id number]   Patient Care Team: Melene Plan, MD as PCP - General (Family Medicine) Bensimhon, Bevelyn Buckles, MD as PCP - Cardiology (Cardiology) Allayne Butcher, PA-C as Physician Assistant (Cardiology) Marvel Plan, MD as Consulting Physician (Neurology) Melene Plan, MD (Family Medicine) Juanell Fairly, RN as [facility] Care Management Juanell Fairly, RN as Case Manager  Patient Active Problem List   Diagnosis Date Noted  . Memory change 07/27/2019  . Post-traumatic headache, not intractable 07/27/2019  . Hypoxia 07/20/2019  . Prolonged QT interval 07/20/2019  . Chronic kidney disease (CKD), stage III (moderate) 07/01/2018  . CAD (coronary atherosclerotic disease) 07/01/2018  . H/O ETOH abuse 07/01/2018  . NSTEMI (non-ST elevated myocardial infarction) (HCC) 05/12/2018  . Carotid stenosis, right   . History of tobacco abuse 10/23/2017  . Hyperkalemia 09/18/2017  . Chronic HFrEF (heart failure with reduced ejection fraction) (HCC) 07/08/2017  . 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 HTN (hypertension) 06/15/2014  . HLD (hyperlipidemia) 04/05/2014  . PVD (peripheral vascular disease) with claudication (HCC) 05/09/2013  . AAA (abdominal aortic aneurysm) without rupture (HCC) 10/28/2012  . Generalized anxiety disorder 02/12/2012  . Carotid occlusion, left 01/19/2012  . Pulmonary nodule 05/19/2011  . CAD, ARTERY BYPASS GRAFT 10/19/2008    Current Outpatient Medications:  .  aspirin EC 81 MG tablet, Take 1 tablet (81 mg total) by mouth daily., Disp: 30 tablet, Rfl: 2 .  atorvastatin (LIPITOR) 80 MG tablet, Take 1 tablet (80 mg total) by mouth every evening., Disp: 90 tablet, Rfl: 3 .  carvedilol (COREG) 12.5 MG tablet, Take 1 tablet (12.5 mg total) by mouth 2 (two) times daily with a meal., Disp: 60 tablet, Rfl: 3 .  citalopram (CELEXA) 20  MG tablet, Take 1 tablet (20 mg total) by mouth daily., Disp: 90 tablet, Rfl: 0 .  clopidogrel (PLAVIX) 75 MG tablet, Take 1 tablet (75 mg total) by mouth daily with breakfast., Disp: 30 tablet, Rfl: 5 .  digoxin (LANOXIN) 0.125 MG tablet, Take 1 tablet (0.125 mg total) by mouth daily., Disp: 90 tablet, Rfl: 3 .  isosorbide mononitrate (IMDUR) 60 MG 24 hr tablet, Take 1 tablet (60 mg total) by mouth daily., Disp: 30 tablet, Rfl: 5 .  lisinopril (ZESTRIL) 10 MG tablet, Take 1 tablet (10 mg total) by mouth daily., Disp: 30 tablet, Rfl: 11 .  torsemide (DEMADEX) 20 MG tablet, Take 1 tablet (20 mg total) by mouth daily., Disp: 30 tablet, Rfl: 3 .  ezetimibe (ZETIA) 10 MG tablet, Take 1 tablet (10 mg total) by mouth daily. (Patient not taking: Reported on 08/09/2019), Disp: 30 tablet, Rfl: 5 .  nitroGLYCERIN (NITROSTAT) 0.4 MG SL tablet, Place 1 tablet (0.4 mg total) under the tongue every 5 (five) minutes x 3 doses as needed for chest pain. (Patient not taking: Reported on 08/09/2019), Disp: 25 tablet, Rfl: 12 .  spironolactone (ALDACTONE) 25 MG tablet, Take 1 tablet (25 mg total) by mouth daily., Disp: 30 tablet, Rfl: 5 Allergies  Allergen Reactions  . Entresto [Sacubitril-Valsartan] Nausea Only and Other (See Comments)    Dizziness and "sick on stomach"      Social History   Socioeconomic History  . Marital status: Unknown    Spouse name: Not on file  . Number of children: 1  . Years of education: 9th  . Highest education level:  Not on file  Occupational History  . Occupation: not working.   Tobacco Use  . Smoking status: Former Smoker    Packs/day: 0.50    Years: 30.00    Pack years: 15.00    Types: Cigarettes    Quit date: 05/04/2018    Years since quitting: 1.4  . Smokeless tobacco: Never Used  Substance and Sexual Activity  . Alcohol use: Yes    Comment: daily  . Drug use: Not Currently    Types: Marijuana  . Sexual activity: Yes  Other Topics Concern  . Not on file  Social  History Narrative   Pt is widowed and lives alone.   Patient is right handed   Patient drinks about 2cups of caffeine daily.      Social Determinants of Health   Financial Resource Strain:   . Difficulty of Paying Living Expenses:   Food Insecurity:   . Worried About Charity fundraiser in the Last Year:   . Arboriculturist in the Last Year:   Transportation Needs:   . Film/video editor (Medical):   [name] Lack of Transportation (Non-Medical):   Physical Activity:   . Days of Exercise per Week:   . Minutes of Exercise per Session:   Stress:   . Feeling of Stress :   Social Connections:   . Frequency of Communication with Friends and Family:   . Frequency of Social Gatherings with Friends and Family:   . Attends Religious Services:   . Active Member of Clubs or Organizations:   . Attends Archivist Meetings:   [name] Marital Status:   Intimate Partner Violence:   . Fear of Current or Ex-Partner:   . Emotionally Abused:   [name] Physically Abused:   . Sexually Abused:     Physical Exam      Future Appointments  Date Time Provider [place]  09/30/2019 10:00 AM Evans Lance, MD [facility]  10/17/2019 10:40 AM Bensimhon, Shaune Pascal, MD [facility] None  10/24/2019  3:00 PM [facility] 2 [facility]  10/28/2019  8:45 AM Hilty, Nadean Corwin, MD [facility]    BP (!) 92/58   Pulse (!) 58   Temp 98.4 F (36.9 C)   Resp 16   Wt 167 lb (75.8 kg)   SpO2 98%   BMI 23.96 kg/m    B/p standing-104 Weight yesterday-167 Last visit weight-167  Pt reports he is doing good. They have not been able to get his sleep study sch due to not being able to reach him. Eliezer Lofts will call me when she gets the rep on the phone and then get this done.  jenna just informed us that his co-pay amount due to medicaid is $199. He was not aware of this. And he is not able to provide this co-pay amount. She will keep me informed.  Pt denies sob, no dizziness, no c/p. No more  syncopal episodes. Appetite ok. Just cant eat as much at once like he used to.  B/p same as last week.  He denies any dizziness. His b/p did not drop when standing. He states he is drinking several bottles of water daily.  He is going to take his b/p here at home and check it in the AM and the PM and well see how his numbers are next week.      Marylouise Stacks, [facility] Paramedic  09/27/19

## 2019-09-30 ENCOUNTER — Institutional Professional Consult (permissible substitution): Payer: Medicaid Other | Admitting: Internal Medicine

## 2019-10-05 ENCOUNTER — Telehealth (HOSPITAL_COMMUNITY): Payer: Self-pay | Admitting: Licensed Clinical Social Worker

## 2019-10-05 ENCOUNTER — Encounter: Payer: Self-pay | Admitting: Internal Medicine

## 2019-10-05 ENCOUNTER — Telehealth: Payer: Medicaid Other | Admitting: Internal Medicine

## 2019-10-05 VITALS — BP 160/93 | HR 73 | Ht 70.0 in | Wt 166.0 lb

## 2019-10-05 NOTE — Telephone Encounter (Signed)
CSW called [facility] and left message to finish setting up pt account and provide payment- unable to reach left VM requesting return call  Burna Sis, LCSW Clinical Social Worker [facility] Desk#: [phone] Cell#: [phone]

## 2019-10-07 ENCOUNTER — Telehealth (HOSPITAL_COMMUNITY): Payer: Self-pay | Admitting: Licensed Clinical Social Worker

## 2019-10-07 NOTE — Telephone Encounter (Signed)
CSW able to pay for pt [facility] home sleep study and confirmed that they are shipping out to patient home.  Patient informed and expressed understanding.  CSW will continue to follow and assist as needed  Burna Sis, LCSW Clinical Social Worker [facility] Desk#: [phone] Cell#: [phone]

## 2019-10-17 ENCOUNTER — Encounter (HOSPITAL_COMMUNITY): Payer: Medicaid Other | Admitting: Internal Medicine

## 2019-10-24 ENCOUNTER — Ambulatory Visit (HOSPITAL_COMMUNITY): Admission: RE | Admit: 2019-10-24 | Payer: Medicaid Other | Source: Ambulatory Visit

## 2019-10-25 ENCOUNTER — Other Ambulatory Visit (HOSPITAL_COMMUNITY): Payer: Self-pay | Admitting: Internal Medicine

## 2019-10-28 ENCOUNTER — Ambulatory Visit: Payer: Medicaid Other | Admitting: Internal Medicine

## 2019-11-01 ENCOUNTER — Other Ambulatory Visit (HOSPITAL_COMMUNITY): Payer: Self-pay

## 2019-11-01 NOTE — Progress Notes (Signed)
Paramedicine Encounter    Patient ID: Randy [name], male    DOB: 11-Apr-1961, 59 y.o.   MRN: [id number]   Patient Care Team: Melene Plan, MD as PCP - General (Family Medicine) Bensimhon, Bevelyn Buckles, MD as PCP - Cardiology (Cardiology) Allayne Butcher, PA-C as Physician Assistant (Cardiology) Marvel Plan, MD as Consulting Physician (Neurology) Melene Plan, MD (Family Medicine) Juanell Fairly, RN as [facility] Care Management Juanell Fairly, RN as Case Manager  Patient Active Problem List   Diagnosis Date Noted  . Memory change 07/27/2019  . Post-traumatic headache, not intractable 07/27/2019  . Hypoxia 07/20/2019  . Prolonged QT interval 07/20/2019  . Chronic kidney disease (CKD), stage III (moderate) 07/01/2018  . CAD (coronary atherosclerotic disease) 07/01/2018  . H/O ETOH abuse 07/01/2018  . NSTEMI (non-ST elevated myocardial infarction) (HCC) 05/12/2018  . Carotid stenosis, right   . History of tobacco abuse 10/23/2017  . Hyperkalemia 09/18/2017  . Chronic HFrEF (heart failure with reduced ejection fraction) (HCC) 07/08/2017  . 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 HTN (hypertension) 06/15/2014  . HLD (hyperlipidemia) 04/05/2014  . PVD (peripheral vascular disease) with claudication (HCC) 05/09/2013  . AAA (abdominal aortic aneurysm) without rupture (HCC) 10/28/2012  . Generalized anxiety disorder 02/12/2012  . Carotid occlusion, left 01/19/2012  . Pulmonary nodule 05/19/2011  . CAD, ARTERY BYPASS GRAFT 10/19/2008    Current Outpatient Medications:  .  aspirin EC 81 MG tablet, Take 1 tablet (81 mg total) by mouth daily., Disp: 30 tablet, Rfl: 2 .  atorvastatin (LIPITOR) 80 MG tablet, Take 1 tablet (80 mg total) by mouth every evening., Disp: 90 tablet, Rfl: 3 .  carvedilol (COREG) 12.5 MG tablet, Take 1 tablet (12.5 mg total) by mouth 2 (two) times daily with a meal., Disp: 60 tablet, Rfl: 3 .  citalopram (CELEXA) 20  MG tablet, Take 1 tablet (20 mg total) by mouth daily., Disp: 90 tablet, Rfl: 0 .  clopidogrel (PLAVIX) 75 MG tablet, TAKE ONE TABLET BY MOUTH ONCE DAILY WITH BREAKFAST, Disp: 30 tablet, Rfl: 5 .  digoxin (LANOXIN) 0.125 MG tablet, Take 1 tablet (0.125 mg total) by mouth daily., Disp: 90 tablet, Rfl: 3 .  isosorbide mononitrate (IMDUR) 60 MG 24 hr tablet, TAKE ONE TABLET BY MOUTH DAILY., Disp: 30 tablet, Rfl: 5 .  lisinopril (ZESTRIL) 10 MG tablet, Take 1 tablet (10 mg total) by mouth daily., Disp: 30 tablet, Rfl: 11 .  spironolactone (ALDACTONE) 25 MG tablet, TAKE ONE TABLET BY MOUTH DAILY., Disp: 30 tablet, Rfl: 5 .  ezetimibe (ZETIA) 10 MG tablet, Take 1 tablet (10 mg total) by mouth daily. (Patient not taking: Reported on 11/01/2019), Disp: 30 tablet, Rfl: 5 .  nitroGLYCERIN (NITROSTAT) 0.4 MG SL tablet, Place 1 tablet (0.4 mg total) under the tongue every 5 (five) minutes x 3 doses as needed for chest pain. (Patient not taking: Reported on 11/01/2019), Disp: 25 tablet, Rfl: 12 .  torsemide (DEMADEX) 20 MG tablet, Take 1 tablet (20 mg total) by mouth daily., Disp: 30 tablet, Rfl: 3 Allergies  Allergen Reactions  . Entresto [Sacubitril-Valsartan] Nausea Only and Other (See Comments)    Dizziness and "sick on stomach"      Social History   Socioeconomic History  . Marital status: Unknown    Spouse name: Not on file  . Number of children: 1  . Years of education: 9th  . Highest education level: Not on file  Occupational History  .  Occupation: not working.   Tobacco Use  . Smoking status: Former Smoker    Packs/day: 0.50    Years: 30.00    Pack years: 15.00    Types: Cigarettes    Quit date: 05/04/2018    Years since quitting: 1.4  . Smokeless tobacco: Never Used  Substance and Sexual Activity  . Alcohol use: Yes    Comment: daily  . Drug use: Not Currently    Types: Marijuana  . Sexual activity: Yes  Other Topics Concern  . Not on file  Social History Narrative   Pt is  widowed and lives alone.   Patient is right handed   Patient drinks about 2cups of caffeine daily.      Social Determinants of Health   Financial Resource Strain:   . Difficulty of Paying Living Expenses:   Food Insecurity:   . Worried About Charity fundraiser in the Last Year:   . Arboriculturist in the Last Year:   Transportation Needs:   . Film/video editor (Medical):   [name] Lack of Transportation (Non-Medical):   Physical Activity:   . Days of Exercise per Week:   . Minutes of Exercise per Session:   Stress:   . Feeling of Stress :   Social Connections:   . Frequency of Communication with Friends and Family:   . Frequency of Social Gatherings with Friends and Family:   . Attends Religious Services:   . Active Member of Clubs or Organizations:   . Attends Archivist Meetings:   [name] Marital Status:   Intimate Partner Violence:   . Fear of Current or Ex-Partner:   . Emotionally Abused:   [name] Physically Abused:   . Sexually Abused:     Physical Exam      Future Appointments  Date Time Provider [place]  12/26/2019 11:40 AM Bensimhon, Shaune Pascal, MD [facility] None    BP 110/72   Pulse 60   Temp 98.2 F (36.8 C)   Resp 18   SpO2 98%   Weight yesterday-167 Last visit weight-167  Pt reports he is doing good. He denies any complaints.  He denies sob, no dizziness, no c/p.  He missed his chest CT appointment. He will need to resch that.  He got his sleep study device and will be going to his daughters to spend night to use her bluetooth devices to be able to use it and he will do that sometime this week.  He has been forgetting his doctor appointments a lot lately and he has missed 2 appointments recently. I wrote the numbers down to call to resch.  Reviewed meds--for some reason he has not been taking his atorvastatin-still not sure why!  He isnt either.  Other meds he has everything and places it in his pill box himself.   Marylouise Stacks,  [place] [facility] Paramedic  11/01/19

## 2019-11-02 NOTE — Progress Notes (Unsigned)
No show. Erroneus encounter. GT

## 2019-11-09 ENCOUNTER — Telehealth (HOSPITAL_COMMUNITY): Payer: Self-pay

## 2019-11-09 NOTE — Telephone Encounter (Signed)
Phone visit with pt today,he is just waking up around 130pm.   he has not done his sleep study yet. He is planning on doing it, I have encouraged him to get it done.  He has to borrow a phone to use it on wifi to use the sleep study app.  He reports his weights are staying same.  He denies increased sob, no dizziness.   He has not been taking his b/p at home lately.  Encouraged him to do that.  Will see him for home visit next week.   Kerry Hough, EMT-Paramedic  11/09/19

## 2019-11-17 ENCOUNTER — Other Ambulatory Visit (HOSPITAL_COMMUNITY): Payer: Self-pay

## 2019-11-17 NOTE — Progress Notes (Signed)
Paramedicine Encounter    Patient ID: Randy [name], male    DOB: 11-May-1961, 59 y.o.   MRN: [id number]   Patient Care Team: Melene Plan, MD as PCP - General (Family Medicine) Bensimhon, Bevelyn Buckles, MD as PCP - Cardiology (Cardiology) Allayne Butcher, PA-C as Physician Assistant (Cardiology) Marvel Plan, MD as Consulting Physician (Neurology) Melene Plan, MD (Family Medicine) Juanell Fairly, RN as [facility] Care Management Juanell Fairly, RN as Case Manager  Patient Active Problem List   Diagnosis Date Noted  . Memory change 07/27/2019  . Post-traumatic headache, not intractable 07/27/2019  . Hypoxia 07/20/2019  . Prolonged QT interval 07/20/2019  . Chronic kidney disease (CKD), stage III (moderate) 07/01/2018  . CAD (coronary atherosclerotic disease) 07/01/2018  . H/O ETOH abuse 07/01/2018  . NSTEMI (non-ST elevated myocardial infarction) (HCC) 05/12/2018  . Carotid stenosis, right   . History of tobacco abuse 10/23/2017  . Hyperkalemia 09/18/2017  . Chronic HFrEF (heart failure with reduced ejection fraction) (HCC) 07/08/2017  . 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 HTN (hypertension) 06/15/2014  . HLD (hyperlipidemia) 04/05/2014  . PVD (peripheral vascular disease) with claudication (HCC) 05/09/2013  . AAA (abdominal aortic aneurysm) without rupture (HCC) 10/28/2012  . Generalized anxiety disorder 02/12/2012  . Carotid occlusion, left 01/19/2012  . Pulmonary nodule 05/19/2011  . CAD, ARTERY BYPASS GRAFT 10/19/2008    Current Outpatient Medications:  .  aspirin EC 81 MG tablet, Take 1 tablet (81 mg total) by mouth daily., Disp: 30 tablet, Rfl: 2 .  atorvastatin (LIPITOR) 80 MG tablet, Take 1 tablet (80 mg total) by mouth every evening., Disp: 90 tablet, Rfl: 3 .  carvedilol (COREG) 12.5 MG tablet, Take 1 tablet (12.5 mg total) by mouth 2 (two) times daily with a meal., Disp: 60 tablet, Rfl: 3 .  citalopram (CELEXA) 20  MG tablet, Take 1 tablet (20 mg total) by mouth daily., Disp: 90 tablet, Rfl: 0 .  clopidogrel (PLAVIX) 75 MG tablet, TAKE ONE TABLET BY MOUTH ONCE DAILY WITH BREAKFAST, Disp: 30 tablet, Rfl: 5 .  digoxin (LANOXIN) 0.125 MG tablet, Take 1 tablet (0.125 mg total) by mouth daily., Disp: 90 tablet, Rfl: 3 .  isosorbide mononitrate (IMDUR) 60 MG 24 hr tablet, TAKE ONE TABLET BY MOUTH DAILY., Disp: 30 tablet, Rfl: 5 .  lisinopril (ZESTRIL) 10 MG tablet, Take 1 tablet (10 mg total) by mouth daily., Disp: 30 tablet, Rfl: 11 .  spironolactone (ALDACTONE) 25 MG tablet, TAKE ONE TABLET BY MOUTH DAILY., Disp: 30 tablet, Rfl: 5 .  ezetimibe (ZETIA) 10 MG tablet, Take 1 tablet (10 mg total) by mouth daily. (Patient not taking: Reported on 11/01/2019), Disp: 30 tablet, Rfl: 5 .  nitroGLYCERIN (NITROSTAT) 0.4 MG SL tablet, Place 1 tablet (0.4 mg total) under the tongue every 5 (five) minutes x 3 doses as needed for chest pain. (Patient not taking: Reported on 11/17/2019), Disp: 25 tablet, Rfl: 12 .  torsemide (DEMADEX) 20 MG tablet, Take 1 tablet (20 mg total) by mouth daily., Disp: 30 tablet, Rfl: 3 Allergies  Allergen Reactions  . Entresto [Sacubitril-Valsartan] Nausea Only and Other (See Comments)    Dizziness and "sick on stomach"      Social History   Socioeconomic History  . Marital status: Unknown    Spouse name: Not on file  . Number of children: 1  . Years of education: 9th  . Highest education level: Not on file  Occupational History  .  Occupation: not working.   Tobacco Use  . Smoking status: Former Smoker    Packs/day: 0.50    Years: 30.00    Pack years: 15.00    Types: Cigarettes    Quit date: 05/04/2018    Years since quitting: 1.5  . Smokeless tobacco: Never Used  Substance and Sexual Activity  . Alcohol use: Yes    Comment: daily  . Drug use: Not Currently    Types: Marijuana  . Sexual activity: Yes  Other Topics Concern  . Not on file  Social History Narrative   Pt is  widowed and lives alone.   Patient is right handed   Patient drinks about 2cups of caffeine daily.      Social Determinants of Health   Financial Resource Strain:   . Difficulty of Paying Living Expenses:   Food Insecurity:   . Worried About Charity fundraiser in the Last Year:   . Arboriculturist in the Last Year:   Transportation Needs:   . Film/video editor (Medical):   [name] Lack of Transportation (Non-Medical):   Physical Activity:   . Days of Exercise per Week:   . Minutes of Exercise per Session:   Stress:   . Feeling of Stress :   Social Connections:   . Frequency of Communication with Friends and Family:   . Frequency of Social Gatherings with Friends and Family:   . Attends Religious Services:   . Active Member of Clubs or Organizations:   . Attends Archivist Meetings:   [name] Marital Status:   Intimate Partner Violence:   . Fear of Current or Ex-Partner:   . Emotionally Abused:   [name] Physically Abused:   . Sexually Abused:     Physical Exam      Future Appointments  Date Time Provider [place]  11/23/2019  8:00 AM [facility] 2 [facility]-CT [place]  11/30/2019  9:00 AM Evans Lance, MD [facility]  12/26/2019 11:40 AM Bensimhon, Shaune Pascal, MD [facility] None  01/18/2020  9:15 AM Hilty, Nadean Corwin, MD [facility]    BP 124/76   Pulse 64   Temp 98.2 F (36.8 C)   Resp 16   Wt 166 lb (75.3 kg)   SpO2 98%   BMI 23.82 kg/m   Weight yesterday-166 Last visit weight-167  Pt reports feeling good, he denies sob, no dizziness. No edema noted.  He was slouched over when listening to BBS and there was a wheeze to his lower rt l=side--once he sat up his lungs were clear. He denies coughing/no fever.   tried to set up his internet to a spare smart phone his family gave him to use for his sleep study. His internet was not working so he is going to call [employer] to get someone to come out to see what the problem is.  But he does have  the equipment for sleep study.  Got his chest CT reschduled for next Wednesday 5/26 to arrive at 745.  He is out of his torsemide X 3day--called pharmacy to get that ordered--he didn't call himself due to he wasn't sure if [facility] had it pulled from [facility] and he wasn't sure how to handle that.  meds verified. Will f/u next week once he gets [employer] out there.    Marylouise Stacks, [facility] Paramedic  11/17/19

## 2019-11-22 ENCOUNTER — Telehealth (HOSPITAL_COMMUNITY): Payer: Self-pay

## 2019-11-22 NOTE — Telephone Encounter (Signed)
Spoke to pt today to see if [employer] had came out yet to fix his wifi so we can get sleep study done but pt reports he got a phone call from the sleep study company and was told that they now have a device that doesn't need a phone or computer to do the sleep study, it just plugs up in the wall somehow. He has not got it yet. I will f/u with him next week. I also reminded him of his appointment tomor for the chest CT. He was planning on going.   Kerry Hough, EMT-Paramedic  11/22/19

## 2019-11-23 ENCOUNTER — Ambulatory Visit (HOSPITAL_COMMUNITY): Admission: RE | Admit: 2019-11-23 | Payer: Medicaid Other | Source: Ambulatory Visit

## 2019-11-30 ENCOUNTER — Institutional Professional Consult (permissible substitution): Payer: Medicaid Other | Admitting: Internal Medicine

## 2019-12-05 ENCOUNTER — Ambulatory Visit: Payer: Self-pay

## 2019-12-05 ENCOUNTER — Telehealth (HOSPITAL_COMMUNITY): Payer: Self-pay

## 2019-12-05 ENCOUNTER — Other Ambulatory Visit: Payer: Self-pay | Admitting: Family Medicine

## 2019-12-05 NOTE — Chronic Care Management (AMB) (Signed)
  Chronic Care Management   Note  12/05/2019 Name: Dereon Corkery MRN: [id number] DOB: 07/24/1960  RNCM received a note from Kerry Hough EMT-Paramedic requesting me to send message to Dr. Selena Batten for the patient to refill his Citalopram and send the refills to [facility].  RNCM  Sent the request to the Physician.  Juanell Fairly RN, BSN, [facility] Care Management Coordinator [facility] Phone: [phone] Fax: [phone]

## 2019-12-05 NOTE — Telephone Encounter (Signed)
Pt had called me and left VM regarding trouble getting some meds refilled.  It was his citalopram and digoxin. He called [facility] himself but the pharmacist wasn't in and th girls that work there couldn't give him the information why it couldn't be refilled.  I called pharmacy this morning and the digoxin is now ready for p/u but the citalopram had no more refills on it and [facility] sent over request to wrong doctor. I sent message to his PCP office requesting refill be sent to [facility].   Kerry Hough, EMT-Paramedic  12/05/19

## 2019-12-07 ENCOUNTER — Other Ambulatory Visit: Payer: Self-pay | Admitting: Family Medicine

## 2019-12-07 DIAGNOSIS — I5020 Unspecified systolic (congestive) heart failure: Secondary | ICD-10-CM

## 2019-12-07 MED ORDER — CITALOPRAM HYDROBROMIDE 20 MG PO TABS: 20.0000 mg | ORAL_TABLET | Freq: Every day | ORAL | 0 refills | Status: DC

## 2019-12-25 NOTE — Progress Notes (Signed)
Date:  12/26/2019   ID:  Randy [name], DOB Dec 25, 1960, MRN [id number]  Location: Home  Provider location: [facility] Type of Visit: Established patient  PCP:  Randy Plan, MD  Cardiologist:  Randy Meres, MD Primary HF: Randy [name]  Chief Complaint: F/u for Syncope and Chronic Systolic Heart Failure    History of Present Illness: Randy [name] is a 59 y.o. male with history of combined HF (diagnosed 03/2017), HTN, HLD, CAD s/p CABG 2010 and PCI (last ~2014), former tobacco use, CVA, and AAA.    Admitted 03/2017 with CHF exacerbation. Echo showed EF 30-35% with grade 2 DD. Refused cath at that time.   Quit smoking Jan 2019. Smoked for ~50 years prior 1 ppd. ETOH use (Bud Light) 18 pack/week, decreased from 18-24 beers daily. Has not smoked or had alcohol since 05/12/2018.   He was initially seen in the HF clinic June 2019. At that time he was set up for heart cath and switched to entresto. He was not on spiro with a history of hyperkalemia. He underwent LHC/RHC in July with 70-80% LM lesion.  Reduced EF was thought to be related to ETOH abuse.   Admitted to [facility] 05/12/2018 with increased dyspnea and chest pain. ECHO EF 15%. Diuresed with IV lasix but had poor response so milrinone was added. Diuresed over 20 pounds. Once adequately diuresed interventional cardiology took him to the cath lab and he underwent PCI DES to left main. Vascular consulted and repeated carotid U/S. This suggested total occlusion R ICA and 50-75% LICA. He did not require surgical intervention but will continue statin.  Admitted 1/2-07/03/18 with volume overload after stopping medications for 3 days. He was hypoxic on arrival to ED. He diuresed with IV lasix. Repeat echo showed EF 20-25% (improved from 15%). He had a CT of chest that showed LLL nodule with recommendations of repeat CT vs PET in 3 months. DC weight: 168 lbs.   On 07/18/19 he was seen by HF Paramedicine. He was out of 3  blood pressure medications. BP was elevated so he was instructed to get his medications. He was out of carvedilol, imdur, and lisinopril.   Had syncopal episode while driving on 9/70/26. He was seen by PCP the next day and considered for hospital admit but he refused He had low O2 sats>>>Seen in [facility] 1/21 for f/u and additional w/u for syncope. Felt to be dehydrated. SCr up ~2.0. Instructed to hold torsemide x 2 days, then resume lower dose,  20 mg once a day. 72 Zio patch was ordered and unremarkable. No arrhthymias.   Echo 2/21 EF 25-30% RV moderately down. Scheduled to see Randy [name] in EP 10/05/19 but missed appt,   Returns for f/u. Here w/ paramedicine. Says he is feeling pretty good. Just not motivated to do much. Denies dyspnea. Able to get around well. Says his legs give out and calves get tight if he walks to far. No rest pain. No sores.  No CP. Complaint with meds.   Cardiac studies:   CT Chest 07/02/2018 1. Positive for a macrolobulated 1.7 x 1.6 x 1.5 cm low-density left lower lobe pulmonary nodule.    Echo 07/02/2018: - Left ventricle: The cavity size was mildly dilated. Wall thickness was normal. Systolic function was severely reduced. The estimated ejection fraction was in the range of 20% to 25%. Diffuse hypokinesis. Doppler parameters are consistent with a reversible restrictive pattern, indicative of decreased left ventricular diastolic compliance and/or increased  left atrial pressure (grade 3 diastolic dysfunction). - Left atrium: The atrium was severely dilated. - Right ventricle: The cavity size was moderately dilated. Wall thickness was normal. Systolic function was moderately reduced. - Right atrium: The atrium was severely dilated. - Pulmonary arteries: Systolic pressure was mildly increased. PA peak pressure: 37 mm Hg (S).   LHC 04/2018   Mid LM lesion is 90% stenosed.  Prox LAD lesion is 100% stenosed.  Ost 2nd Mrg to 2nd Mrg lesion is 100%  stenosed.  2nd Mrg lesion is 99% stenosed.  Prox Cx to Mid Cx lesion is 30% stenosed.  Ost RCA to Prox RCA lesion is 100% stenosed.  Mid Graft lesion is 30% stenosed.  Origin to Prox Graft lesion is 100% stenosed.  SVG.  Prox Graft lesion is 90% stenosed.  A drug-eluting stent was successfully placed using a STENT SYNERGY DES 3X16.  Post intervention, there is a 10% residual stenosis. 1. Severe stenosis of the native left mainstem, treated successfully with PCI using a 3.0 x 16 mm Synergy DES postdilated to high pressure with a 3.25 mm noncompliant balloon 2. Chronic occlusion of the LAD supplied by a widely patent LIMA graft 3. Severe stenosis of the saphenous vein graft to first OM with total occlusion of the OM branch just beyond the coronary anastomosis, unchanged from the previous study 4. Patent saphenous vein graft to first diagonal supplying collaterals to the obtuse marginal branch 5. Known chronic occlusion of the native RCA and saphenous vein graft RCA, not selectively injected Recommend uninterrupted dual antiplatelet therapy with Aspirin 81mg  daily and Clopidogrel 75mg  dailyfor a minimum of 12 months (ACS - Class I recommendation).  Zio Patch 1/21 Study Highlights  1. Sinus rhythm - avg HR of 67 2. Occasional PVCs (~1.5%)  3. No high-grade arrhythmias or blocks.       Review of systems complete and found to be negative unless listed in HPI.     Randy [name] denies symptoms worrisome for COVID 19.   Past Medical History:  Diagnosis Date   AAA (abdominal aortic aneurysm) [facility]) July 2013   AAA (abdominal aortic aneurysm) without rupture ([place]) 10/28/2012   Alcohol abuse    Anxiety    CAD (coronary artery disease)    a. s/p CABG 2010.   CAD, ARTERY BYPASS GRAFT 10/19/2008   CABG and carotid endarterectomy in 2010.  9/12 lexiscan Nuclear stress test that showed mild LV with no ischemic changes. His EF was 45%.  Dr. Lia [name]    Carotid artery  occlusion    a. prior R CEA, stenting of L carotid 2014. b. Severe carotid restenosis in 2017 -> planned for surgery but pt did not f/u.   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 ([place]) 06/16/2014   CHF (congestive heart failure) (HCC)    Chronic combined systolic and diastolic CHF (congestive heart failure) ([place])    Chronic HFrEF (heart failure with reduced ejection fraction) ([place]) 07/08/2017   Generalized anxiety disorder 02/12/2012   GERD (gastroesophageal reflux disease)    Heart attack ([place]) 2010   HTN (hypertension) 06/15/2014   Hx of CABG 2010   Hyperlipidemia    Hypertension    Hypoxia 07/20/2019   Lung nodule    Memory change 07/27/2019   NSTEMI (non-ST elevated myocardial infarction) ([place]) 05/12/2018   Polysubstance abuse ([place])    a. mention of cocaine positivity in 2010 with patient stating unknowingly exposed,  also EtOH.   Prolonged QT interval 07/20/2019   PVD (peripheral vascular disease) (HCC)    a. PAD s/p failed attempt at stenting of the left common iliac artery 2014.   PVD (peripheral vascular disease) with claudication (HCC) 05/09/2013   Stroke (HCC)    Uncontrolled hypertension 02/02/2012   Past Surgical History:  Procedure Laterality Date   ABDOMINAL AORTAGRAM N/A 04/19/2013   Procedure: ABDOMINAL Ronny Flurry;  Surgeon: Nada Libman, MD;  [facility] CATH LAB;  Service: Cardiovascular;  Laterality: N/A;   CAROTID ENDARTERECTOMY Left 01-13-13   Attempted cea   CAROTID STENT INSERTION Left 02/22/2013   Procedure: CAROTID STENT INSERTION;  Surgeon: Nada Libman, MD;  Location: [facility] CATH LAB;  Service: Cardiovascular;  Laterality: Left;   CORONARY ARTERY BYPASS GRAFT  2010   CORONARY STENT INTERVENTION N/A 05/18/2018   Procedure: CORONARY STENT INTERVENTION;  Surgeon: Tonny Bollman, MD;  Location: [facility] INVASIVE CV LAB;  Service: Cardiovascular;  Laterality: N/A;    CORONARY/GRAFT ANGIOGRAPHY N/A 05/18/2018   Procedure: CORONARY/GRAFT ANGIOGRAPHY;  Surgeon: Tonny Bollman, MD;  Location: [facility] INVASIVE CV LAB;  Service: Cardiovascular;  Laterality: N/A;   ENDARTERECTOMY Left 01/13/2013   Procedure: ATTEMPTED ENDARTERECTOMY CAROTID;  Surgeon: Nada Libman, MD;  Location: [facility] OR;  Service: Vascular;  Laterality: Left;   RIGHT/LEFT HEART CATH AND CORONARY/GRAFT ANGIOGRAPHY N/A 12/29/2017   Procedure: RIGHT/LEFT HEART CATH AND CORONARY/GRAFT ANGIOGRAPHY;  Surgeon: Dolores Patty, MD;  Location: [facility] INVASIVE CV LAB;  Service: Cardiovascular;  Laterality: N/A;     Current Outpatient Medications  Medication Sig Dispense Refill   aspirin EC 81 MG tablet Take 1 tablet (81 mg total) by mouth daily. 30 tablet 2   atorvastatin (LIPITOR) 80 MG tablet Take 1 tablet (80 mg total) by mouth every evening. 90 tablet 3   carvedilol (COREG) 12.5 MG tablet Take 1 tablet (12.5 mg total) by mouth 2 (two) times daily with a meal. 60 tablet 3   citalopram (CELEXA) 20 MG tablet Take 1 tablet (20 mg total) by mouth daily. 90 tablet 0   clopidogrel (PLAVIX) 75 MG tablet TAKE ONE TABLET BY MOUTH ONCE DAILY WITH BREAKFAST 30 tablet 5   digoxin (LANOXIN) 0.125 MG tablet Take 1 tablet (0.125 mg total) by mouth daily. 90 tablet 3   isosorbide mononitrate (IMDUR) 60 MG 24 hr tablet TAKE ONE TABLET BY MOUTH DAILY. 30 tablet 5   lisinopril (ZESTRIL) 10 MG tablet Take 1 tablet (10 mg total) by mouth daily. 30 tablet 11   spironolactone (ALDACTONE) 25 MG tablet TAKE ONE TABLET BY MOUTH DAILY. 30 tablet 5   torsemide (DEMADEX) 20 MG tablet Take 20 mg by mouth daily.     No current facility-administered medications for this encounter.    Allergies:   Entresto [sacubitril-valsartan]   Social History:  The patient  reports that he quit smoking about 19 months ago. His smoking use included cigarettes. He has a 15.00 pack-year smoking history. He has never used smokeless  tobacco. He reports current alcohol use. He reports previous drug use. Drug: Marijuana.   Family History:  The patient's family history includes Asthma in his mother; Cancer in his father; Diabetes in his mother; Hyperlipidemia in his mother; Hypertension in his mother; Lung cancer in his father; Stroke in his sister.   ROS:  Please see the history of present illness.   All other systems are personally reviewed and negative.  Vitals:   12/26/19 1048  BP: 140/90  Pulse: 64  SpO2: 99%   Wt Readings from Last 3 Encounters:  12/26/19 74.1 kg (163 lb 6.4 oz)  11/17/19 75.3 kg (166 lb)  10/05/19 75.3 kg (166 lb)    PHYSICAL EXAM: General:  Well appearing. No respiratory difficulty HEENT: normal Neck: supple. no JVD. Carotids 2+ bilat; no bruits. No lymphadenopathy or thyromegaly appreciated. Cor: PMI nondisplaced. Regular rate & rhythm. No rubs, gallops or murmurs. Lungs: clear decreased throughout Abdomen: soft, nontender, nondistended. No hepatosplenomegaly. No bruits or masses. Good bowel sounds. Extremities: no cyanosis, clubbing, rash, edema distal pulses nonpalpable Neuro: alert & oriented x 3, cranial nerves grossly intact. moves all 4 extremities w/o difficulty. Affect pleasant.   Recent Labs: 07/19/2019: Hemoglobin 18.7; Platelets 188 07/20/2019: Magnesium 2.1 09/02/2019: ALT 15 09/09/2019: BUN 14; Creatinine, Ser 1.81; Potassium 4.4; Sodium 136  Personally reviewed   Wt Readings from Last 3 Encounters:  12/26/19 74.1 kg (163 lb 6.4 oz)  11/17/19 75.3 kg (166 lb)  10/05/19 75.3 kg (166 lb)      ASSESSMENT AND [name]:  1. 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 CABG 2010 - Echo in 2017 showed EF 55% - Echo 03/2017: EF 30-35% with grade 2 DD. (EF felt to be down due to ETOH) - Echo 06/2018 EF 20-25% - Echo 2/21 EF 25-30% RV moderately down.  - Stable NYHA II-III. Euvolemic on exam. - Start Farxiga 10 - Change torsemide 20 mg PRN - Continue carvedilol to 12.5 mg  twice a day.  - Continue 25 mg spironolactone daily - Continue lisinopril10 daily. Intolerant entresto due to nausea.  - Continue hydralazine 100 mg three times a day + imdur 60 mg daily.  - Stop digoxin - Long discussion about ICD. He agrees to proceed. Refer to EP - Check labs  2. CAD s/p CABG 2010 -LHC7/2/19 showed patent LIMA to LAD, SVG to RCA occluded, SVG to OM-2 with 95% prox but no runoff vessel. LM 80% Has unprotected AV-groove LCX which feeds collaterals to distal RCA.  - 11/20 S/P DES to left main.  -  No s/s angina -  Continue  ASA, plavix, statin, b-blocker - Cannot tolerate zetia due to stomach pain.  -  Needs lipids rechecked. If LDL > 70 can consider PCSK-9 - Will check FLP and HFTs next week (not fasting today) - Refuses cardiac rehab.   3. Syncope: event 1/21. Head CT unremarkable.  - Zio patch w/o significant arrhthymias - Scheduled to see Randy [name] in EP 10/05/19 but missed appt - No recurrence  4. AAA - 4.8 cm AAA on CT scan 1/21 - needs f/u. Given CKD will get u/s to monitor instead of CT  5. CKD Stage III - Baseline ~1.5-1.8 - recent increase to 2 secondary to dehydration. Diuretics reduced - repeat BMP   6. ?OSA - He snores heavily. - Refuses sleep study.   7. Alcohol abuse - has cut back. Reinforced need to quit completely  8. Carotid Stenosis - Carotid US 04/14/16: Right ICA 80-99%, Left ICA upper range <50% vs low range >50%. Carotid U/S completed 04/2018 . This suggested total occlusion R ICA and 50-75% LICA. He did not require surgical intervention. - Carotid u/s: RCCA occluded. LICA patent stent with "stenotic flow: - continue asa, Plavix and statin.   9. LLL nodule - Stable on CT 1/21 - f/u 1 year  10. Hx of Tobacco use - Remains quit  11. LE claudication. - check ABIs - refer to [facility] for carotid f/u, AAA and  PAD. Can consider Xarelto 2.5 bid based on Compass Trial  12. HTN - BP well controlled at home  Total time spent 45  minutes. Over half that time spent discussing above.    Randy Meres, MD  12/26/2019 11:53 AM  [facility] [address] [facility] [place] [address] [phone] (office) [phone] (fax)

## 2019-12-26 ENCOUNTER — Ambulatory Visit (HOSPITAL_COMMUNITY)
Admission: RE | Admit: 2019-12-26 | Discharge: 2019-12-26 | Disposition: A | Discharge status: Home / Self Care | Payer: Medicaid Other | Source: Ambulatory Visit | Attending: Internal Medicine | Admitting: Internal Medicine

## 2019-12-26 ENCOUNTER — Other Ambulatory Visit: Payer: Self-pay

## 2019-12-26 ENCOUNTER — Other Ambulatory Visit (HOSPITAL_COMMUNITY): Payer: Self-pay

## 2019-12-26 ENCOUNTER — Encounter (HOSPITAL_COMMUNITY): Payer: Self-pay | Admitting: Internal Medicine

## 2019-12-26 VITALS — BP 140/90 | HR 64 | Wt 163.4 lb

## 2019-12-26 DIAGNOSIS — Z7982 Long term (current) use of aspirin: Secondary | ICD-10-CM | POA: Diagnosis not present

## 2019-12-26 DIAGNOSIS — I428 Other cardiomyopathies: Secondary | ICD-10-CM | POA: Diagnosis not present

## 2019-12-26 DIAGNOSIS — Z79899 Other long term (current) drug therapy: Secondary | ICD-10-CM | POA: Diagnosis not present

## 2019-12-26 DIAGNOSIS — R0683 Snoring: Secondary | ICD-10-CM | POA: Diagnosis not present

## 2019-12-26 DIAGNOSIS — Z8673 Personal history of transient ischemic attack (TIA), and cerebral infarction without residual deficits: Secondary | ICD-10-CM | POA: Insufficient documentation

## 2019-12-26 DIAGNOSIS — Z8249 Family history of ischemic heart disease and other diseases of the circulatory system: Secondary | ICD-10-CM | POA: Diagnosis not present

## 2019-12-26 DIAGNOSIS — E785 Hyperlipidemia, unspecified: Secondary | ICD-10-CM | POA: Diagnosis not present

## 2019-12-26 DIAGNOSIS — Z951 Presence of aortocoronary bypass graft: Secondary | ICD-10-CM | POA: Insufficient documentation

## 2019-12-26 DIAGNOSIS — I714 Abdominal aortic aneurysm, without rupture, unspecified: Secondary | ICD-10-CM

## 2019-12-26 DIAGNOSIS — I5022 Chronic systolic (congestive) heart failure: Secondary | ICD-10-CM | POA: Diagnosis not present

## 2019-12-26 DIAGNOSIS — Z87891 Personal history of nicotine dependence: Secondary | ICD-10-CM | POA: Insufficient documentation

## 2019-12-26 DIAGNOSIS — Z955 Presence of coronary angioplasty implant and graft: Secondary | ICD-10-CM | POA: Insufficient documentation

## 2019-12-26 DIAGNOSIS — Z7902 Long term (current) use of antithrombotics/antiplatelets: Secondary | ICD-10-CM | POA: Insufficient documentation

## 2019-12-26 DIAGNOSIS — I6523 Occlusion and stenosis of bilateral carotid arteries: Secondary | ICD-10-CM | POA: Diagnosis not present

## 2019-12-26 DIAGNOSIS — I6522 Occlusion and stenosis of left carotid artery: Secondary | ICD-10-CM | POA: Diagnosis not present

## 2019-12-26 DIAGNOSIS — I739 Peripheral vascular disease, unspecified: Secondary | ICD-10-CM | POA: Insufficient documentation

## 2019-12-26 DIAGNOSIS — N183 Chronic kidney disease, stage 3 unspecified: Secondary | ICD-10-CM | POA: Diagnosis not present

## 2019-12-26 DIAGNOSIS — I252 Old myocardial infarction: Secondary | ICD-10-CM | POA: Insufficient documentation

## 2019-12-26 DIAGNOSIS — Z8349 Family history of other endocrine, nutritional and metabolic diseases: Secondary | ICD-10-CM | POA: Insufficient documentation

## 2019-12-26 DIAGNOSIS — I130 Hypertensive heart and chronic kidney disease with heart failure and stage 1 through stage 4 chronic kidney disease, or unspecified chronic kidney disease: Secondary | ICD-10-CM | POA: Insufficient documentation

## 2019-12-26 DIAGNOSIS — I2510 Atherosclerotic heart disease of native coronary artery without angina pectoris: Secondary | ICD-10-CM | POA: Diagnosis not present

## 2019-12-26 DIAGNOSIS — K219 Gastro-esophageal reflux disease without esophagitis: Secondary | ICD-10-CM | POA: Insufficient documentation

## 2019-12-26 DIAGNOSIS — R911 Solitary pulmonary nodule: Secondary | ICD-10-CM | POA: Insufficient documentation

## 2019-12-26 LAB — LIPID PANEL
Cholesterol: 264 mg/dL — ABNORMAL HIGH (ref 0–200)
HDL: 44 mg/dL (ref >40)
LDL Cholesterol: 200 mg/dL — ABNORMAL HIGH (ref 0–99)
Total CHOL/HDL Ratio: 6 RATIO
Triglycerides: 99 mg/dL (ref <150)
VLDL: 20 mg/dL (ref 0–40)

## 2019-12-26 LAB — COMPREHENSIVE METABOLIC PANEL
ALT: 10 U/L (ref 0–44)
AST: 13 U/L — ABNORMAL LOW (ref 15–41)
Albumin: 3.2 g/dL — ABNORMAL LOW (ref 3.5–5.0)
Alkaline Phosphatase: 67 U/L (ref 38–126)
Anion gap: 10 (ref 5–15)
BUN: 10 mg/dL (ref 8–23)
CO2: 21 mmol/L — ABNORMAL LOW (ref 22–32)
Calcium: 8.7 mg/dL — ABNORMAL LOW (ref 8.9–10.3)
Chloride: 101 mmol/L (ref 98–111)
Creatinine, Ser: 1.56 mg/dL — ABNORMAL HIGH (ref 0.61–1.24)
GFR calc Af Amer: 55 mL/min — ABNORMAL LOW (ref >60)
GFR calc non Af Amer: 47 mL/min — ABNORMAL LOW (ref >60)
Glucose, Bld: 93 mg/dL (ref 70–99)
Potassium: 5.1 mmol/L (ref 3.5–5.1)
Sodium: 132 mmol/L — ABNORMAL LOW (ref 135–145)
Total Bilirubin: 1 mg/dL (ref 0.3–1.2)
Total Protein: 5.9 g/dL — ABNORMAL LOW (ref 6.5–8.1)

## 2019-12-26 LAB — CBC
HCT: 47.7 % (ref 39.0–52.0)
Hemoglobin: 15.5 g/dL (ref 13.0–17.0)
MCH: 29 pg (ref 26.0–34.0)
MCHC: 32.5 g/dL (ref 30.0–36.0)
MCV: 89.3 fL (ref 80.0–100.0)
Platelets: 101 10*3/uL — ABNORMAL LOW (ref 150–400)
RBC: 5.34 MIL/uL (ref 4.22–5.81)
RDW: 17.2 % — ABNORMAL HIGH (ref 11.5–15.5)
WBC: 3.5 10*3/uL — ABNORMAL LOW (ref 4.0–10.5)
nRBC: 0 % (ref 0.0–0.2)

## 2019-12-26 LAB — BRAIN NATRIURETIC PEPTIDE: B Natriuretic Peptide: 4463.8 pg/mL — ABNORMAL HIGH (ref 0.0–100.0)

## 2019-12-26 MED ORDER — TORSEMIDE 20 MG PO TABS: 20.0000 mg | ORAL_TABLET | Freq: Every day | ORAL | Status: DC | PRN

## 2019-12-26 MED ORDER — DAPAGLIFLOZIN PROPANEDIOL 10 MG PO TABS: 10.0000 mg | ORAL_TABLET | Freq: Every day | ORAL | 6 refills | Status: AC

## 2019-12-26 NOTE — Progress Notes (Signed)
Paramedicine Encounter   Patient ID: Randy [name] , male,   DOB: 11-07-1960,59 y.o.,  MRN: [id number]   Met patient in clinic today with provider.  Weight @ clinic-163 B/p-140/90 p-64 sp02-99  Pt here for f/u.  Leg cramps-but refusing stints in legs that doc wanted to do.  Dr. Sung Amabile will refer him to vein and vascular for further follow up.   Stop digoxin.  Start farxiga 47m.  Stop torsemide and use as needed.   [place]ato his AAA and [place]oreain pressures in legs.  Will move forward with ICD.   Labs done today.  I need to call the sleep study company again to f/u on his equipment to be delivered.   Called [employer] [facility] sleep [facility] and they advised he would have to get back with itamar to see if that was approved or not and someone is suppose to call me back.    KMarylouise Stacks EHinckley6/28/2021

## 2019-12-26 NOTE — Patient Instructions (Addendum)
Stop Digoxin  Decrease Torsemide to AS NEEDED Only  Start Farxiga 10 mg daily  Labs done today, we will contact you for any abnormal results  Your physician has requested that you have an abdominal aorta duplex. During this test, an ultrasound is used to evaluate the aorta. Allow 30 minutes for this exam. Do not eat after midnight the day before and avoid carbonated beverages  Your physician has requested that you have an ankle brachial index (ABI). During this test an ultrasound and blood pressure cuff are used to evaluate the arteries that supply the arms and legs with blood. Allow thirty minutes for this exam. There are no restrictions or special instructions.  You have been referred to Dr Randie Heinz at [facility], they will call you for an appointment  You have been referred to EP to discuss getting a defibrillator, they will call you for an appointment  Please call our office in December to schedule your follow up appointment  If you have any questions or concerns before your next appointment please send [place] a message through [facility] or call our office at [phone].    TO LEAVE A MESSAGE FOR THE NURSE SELECT OPTION 2, PLEASE LEAVE A MESSAGE INCLUDING: . YOUR NAME . DATE OF BIRTH . CALL BACK NUMBER . REASON FOR CALL**this is important as we prioritize the call backs  YOU WILL RECEIVE A CALL BACK THE SAME DAY AS LONG AS YOU CALL BEFORE 4:00 PM  At the [facility], you and your health needs are our priority. As part of our continuing mission to provide you with exceptional heart care, we have created designated Provider Care Teams. These Care Teams include your primary Cardiologist (physician) and Advanced Practice Providers (APPs- Physician Assistants and Nurse Practitioners) who all work together to provide you with the care you need, when you need it.   You may see any of the following providers on your designated Care Team at your next follow  up: [name] Dr Arvilla Meres . Dr Marca Ancona . Tonye Becket, NP . Robbie Lis, PA . Karle Plumber, PharmD   Please be sure to bring in all your medications bottles to every appointment.

## 2019-12-27 ENCOUNTER — Institutional Professional Consult (permissible substitution): Payer: Medicaid Other | Admitting: Internal Medicine

## 2019-12-27 ENCOUNTER — Telehealth (HOSPITAL_COMMUNITY): Payer: Self-pay | Admitting: Pharmacy Technician

## 2019-12-27 NOTE — Telephone Encounter (Signed)
Patient Advocate Encounter   Received notification from Medicaid that prior authorization for Marcelline Deist is required.   PA submitted on CoverMyMeds Key [id number] W Status is pending   Will continue to follow.

## 2019-12-28 NOTE — Telephone Encounter (Signed)
[facility] Patient Advocate Encounter  Prior Authorization for Marcelline Deist has been approved.    PA# [id number] Effective dates: 12/27/2019 through 12/21/2020  Archer Asa, CPhT

## 2019-12-29 ENCOUNTER — Other Ambulatory Visit: Payer: Self-pay

## 2019-12-29 ENCOUNTER — Encounter: Payer: Self-pay | Admitting: Family Medicine

## 2019-12-29 ENCOUNTER — Ambulatory Visit (INDEPENDENT_AMBULATORY_CARE_PROVIDER_SITE_OTHER): Payer: Medicaid Other | Admitting: Family Medicine

## 2019-12-29 VITALS — BP 112/64 | HR 60 | Wt 162.0 lb

## 2019-12-29 DIAGNOSIS — F411 Generalized anxiety disorder: Secondary | ICD-10-CM

## 2019-12-29 DIAGNOSIS — Z1211 Encounter for screening for malignant neoplasm of colon: Secondary | ICD-10-CM | POA: Diagnosis present

## 2019-12-29 DIAGNOSIS — I5020 Unspecified systolic (congestive) heart failure: Secondary | ICD-10-CM

## 2019-12-29 MED ORDER — CITALOPRAM HYDROBROMIDE 20 MG PO TABS: 20.0000 mg | ORAL_TABLET | Freq: Every day | ORAL | 3 refills | Status: AC

## 2019-12-29 NOTE — Patient Instructions (Signed)
Randy [name],   It was so wonderful to meet you in person today! I am glad you are doing so well! Keep up the great work!  We'll continue your Celexa and I will refill for a year  I have sent an referral to GI doctor for a colonoscopy  I will talk to Dr. B about adding medication for your cholesterol   Take care and call if you have any questions!

## 2019-12-29 NOTE — Progress Notes (Signed)
   SUBJECTIVE:  CHIEF COMPLAINT / HPI:   Overall doing well no complaints. Had a few medication changes with cardiology appt the other day.   Anxiety Continues celexa daily. No issues or side effects.  PHQ-9 is 0 today, GAD 7 is 0.  Patient would like to continue medication at this time. Overall doing well and no complaints at this time.   PERTINENT  PMH / PSH: CAD, HFrEF, CKD III  OBJECTIVE:  BP 112/64   Pulse 60   Wt 162 lb (73.5 kg)   SpO2 97%   BMI 23.24 kg/m   General: well appearing  Psych: Patient is well groomed with good hygiene.  Speech is normal with normal rate, latency, volume and intonation.  Behavior is normal with normal eye contact.  Patient is friendly, cooperative.  Tight and logical thought process is.  No abnormal thought content.  Mood is good with appropriate affect and range of emotion.  ASSESSMENT/PLAN:  Generalized anxiety disorder Continue Celexa as requested by patient. Discussed possibly coming off of medication given how well he is doing. Patient declines at this time. Will continue with same dose and monitor with other comorbitidies. History of QTc prolongation (last 457 in January 2021). If worsening, can slowly taper. No renal dosing changes necessary.     Melene Plan, MD [facility]

## 2019-12-30 ENCOUNTER — Encounter: Payer: Self-pay | Admitting: Internal Medicine

## 2019-12-30 ENCOUNTER — Encounter: Payer: Self-pay | Admitting: Family Medicine

## 2019-12-30 ENCOUNTER — Ambulatory Visit: Payer: Medicaid Other | Admitting: Internal Medicine

## 2019-12-30 VITALS — BP 154/88 | HR 90 | Ht 70.0 in | Wt 162.0 lb

## 2019-12-30 DIAGNOSIS — I5022 Chronic systolic (congestive) heart failure: Secondary | ICD-10-CM

## 2019-12-30 NOTE — Progress Notes (Signed)
Pt left before he was seen

## 2019-12-30 NOTE — Assessment & Plan Note (Addendum)
Continue Celexa as requested by patient. Discussed possibly coming off of medication given how well he is doing. Patient declines at this time. Will continue with same dose and monitor with other comorbitidies. History of QTc prolongation (last 457 in January 2021). If worsening, can slowly taper. No renal dosing changes necessary.

## 2020-01-04 ENCOUNTER — Telehealth (HOSPITAL_COMMUNITY): Payer: Self-pay

## 2020-01-04 NOTE — Telephone Encounter (Signed)
Called [facility] sleep study to f/u on his delivery of sleep study device. She reports that she is not able to reach anyone regarding this and will send an email and they are suppose to call me back.   Kerry Hough, EMT-Paramedic  01/04/20

## 2020-01-04 NOTE — Telephone Encounter (Signed)
[facility] number- [phone]

## 2020-01-10 ENCOUNTER — Other Ambulatory Visit (HOSPITAL_COMMUNITY): Payer: Self-pay

## 2020-01-10 NOTE — Progress Notes (Signed)
Paramedicine Encounter    Patient ID: Randy [name], male    DOB: July 18, 1960, 59 y.o.   MRN: [id number]   Patient Care Team: Wilber Oliphant, MD as PCP - General (Family Medicine) Bensimhon, Shaune Pascal, MD as PCP - Cardiology (Cardiology) Consuelo Pandy, PA-C as Physician Assistant (Cardiology) Rosalin Hawking, MD as Consulting Physician (Neurology) Wilber Oliphant, MD (Family Medicine) Lazaro Arms, RN as [place] Management Lazaro Arms, RN as Case Manager  Patient Active Problem List   Diagnosis Date Noted  . Prolonged QT interval 07/20/2019  . Chronic kidney disease (CKD), stage III (moderate) 07/01/2018  . CAD (coronary atherosclerotic disease) 07/01/2018  . H/O ETOH abuse 07/01/2018  . NSTEMI (non-ST elevated myocardial infarction) ([place]) 05/12/2018  . Carotid stenosis, right   . History of tobacco abuse 10/23/2017  . Hyperkalemia 09/18/2017  . Chronic HFrEF (heart failure with reduced ejection fraction) ([place]) 07/08/2017  . 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 HTN (hypertension) 06/15/2014  . HLD (hyperlipidemia) 04/05/2014  . PVD (peripheral vascular disease) with claudication ([place]) 05/09/2013  . AAA (abdominal aortic aneurysm) without rupture ([place]) 10/28/2012  . Generalized anxiety disorder 02/12/2012  . Carotid occlusion, left 01/19/2012  . Pulmonary nodule 05/19/2011  . CAD, ARTERY BYPASS GRAFT 10/19/2008    Current Outpatient Medications:  .  aspirin EC 81 MG tablet, Take 1 tablet (81 mg total) by mouth daily., Disp: 30 tablet, Rfl: 2 .  atorvastatin (LIPITOR) 80 MG tablet, Take 1 tablet (80 mg total) by mouth every evening., Disp: 90 tablet, Rfl: 3 .  carvedilol (COREG) 12.5 MG tablet, Take 1 tablet (12.5 mg total) by mouth 2 (two) times daily with a meal., Disp: 60 tablet, Rfl: 3 .  citalopram (CELEXA) 20 MG tablet, Take 1 tablet (20 mg total) by mouth daily., Disp: 90 tablet, Rfl: 3 .  clopidogrel (PLAVIX) 75  MG tablet, TAKE ONE TABLET BY MOUTH ONCE DAILY WITH BREAKFAST, Disp: 30 tablet, Rfl: 5 .  dapagliflozin propanediol (FARXIGA) 10 MG TABS tablet, Take 1 tablet (10 mg total) by mouth daily before breakfast., Disp: 30 tablet, Rfl: 6 .  isosorbide mononitrate (IMDUR) 60 MG 24 hr tablet, TAKE ONE TABLET BY MOUTH DAILY., Disp: 30 tablet, Rfl: 5 .  lisinopril (ZESTRIL) 10 MG tablet, Take 1 tablet (10 mg total) by mouth daily., Disp: 30 tablet, Rfl: 11 .  spironolactone (ALDACTONE) 25 MG tablet, TAKE ONE TABLET BY MOUTH DAILY., Disp: 30 tablet, Rfl: 5 .  torsemide (DEMADEX) 20 MG tablet, Take 1 tablet (20 mg total) by mouth daily as needed., Disp: , Rfl:  Allergies  Allergen Reactions  . Entresto [Sacubitril-Valsartan] Nausea Only and Other (See Comments)    Dizziness and "sick on stomach"      Social History   Socioeconomic History  . Marital status: Unknown    Spouse name: Not on file  . Number of children: 1  . Years of education: 9th  . Highest education level: Not on file  Occupational History  . Occupation: not working.   Tobacco Use  . Smoking status: Former Smoker    Packs/day: 0.50    Years: 30.00    Pack years: 15.00    Types: Cigarettes    Quit date: 05/04/2018    Years since quitting: 1.6  . Smokeless tobacco: Never Used  Vaping Use  . Vaping Use: Never used  Substance and Sexual Activity  . Alcohol use: Yes    Comment: daily  .  Drug use: Not Currently    Types: Marijuana  . Sexual activity: Yes  Other Topics Concern  . Not on file  Social History Narrative   Pt is widowed and lives alone.   Patient is right handed   Patient drinks about 2cups of caffeine daily.      Social Determinants of Health   Financial Resource Strain:   . Difficulty of Paying Living Expenses:   Food Insecurity:   . Worried About Charity fundraiser in the Last Year:   . Arboriculturist in the Last Year:   Transportation Needs:   . Film/video editor (Medical):   [name] Lack of  Transportation (Non-Medical):   Physical Activity:   . Days of Exercise per Week:   . Minutes of Exercise per Session:   Stress:   . Feeling of Stress :   Social Connections:   . Frequency of Communication with Friends and Family:   . Frequency of Social Gatherings with Friends and Family:   . Attends Religious Services:   . Active Member of Clubs or Organizations:   . Attends Archivist Meetings:   [name] Marital Status:   Intimate Partner Violence:   . Fear of Current or Ex-Partner:   . Emotionally Abused:   [name] Physically Abused:   . Sexually Abused:     Physical Exam      Future Appointments  Date Time Provider Leon  01/18/2020  9:15 AM Pixie Casino, MD [facility]  02/02/2020 10:00 AM [facility]-CV NL VASC 4 [facility]  02/02/2020 11:00 AM [facility]-CV NL VASC 4 [facility]  02/13/2020  9:00 AM Deboraha Sprang, MD [facility]    BP 110/74   Pulse 66   Temp 98.2 F (36.8 C)   Resp 18   SpO2 99%  B/P standing-122/84 Weight yesterday-165 Last visit weight-163 @ clinic  Pt reports he went to see dr Caryl Comes but he waited for nearly 2 hrs and he ended up leaving at 5pm when his appointment was at 330 so he was never seen by doctor. He told them to call him back when he could be seen in a more timely manner. This was for an ICD referral. Looks like he got it resch.  Pt denies sob. No dizziness. No c/p.  He did get the new sleep study kit for a non-bluetooth needed device.  He has not needed to take torsemide.  He did report at end of visit that sometimes when he stands too fast he gets light-headed. No noted orthostatics. But he said its usually when he gets up too fast. So he doesn't try to get up so fast.  He got his sleep study--he will do that tonight and instructions were given to him to send it back when he is done.   Marylouise Stacks, [facility] Paramedic  01/10/20

## 2020-01-18 ENCOUNTER — Ambulatory Visit: Payer: Medicaid Other | Admitting: Internal Medicine

## 2020-01-22 ENCOUNTER — Encounter: Payer: Self-pay | Admitting: Cardiology

## 2020-01-22 DIAGNOSIS — G4733 Obstructive sleep apnea (adult) (pediatric): Secondary | ICD-10-CM

## 2020-01-24 ENCOUNTER — Encounter: Payer: Self-pay | Admitting: General Practice

## 2020-01-31 ENCOUNTER — Telehealth (HOSPITAL_COMMUNITY): Payer: Self-pay

## 2020-01-31 NOTE — Telephone Encounter (Signed)
I called pt this week for f/u on sleep study. He did get the test done and sent back in.  He is not getting very anxious about returning to doctors offices with COVID cases increasing so I told him to be sure he calls to tell the provider that he will be willing to do phone visit but not in person. However he does have his imaging tests coming up in a couple days. I encouraged him to be sure to go that. He said he would be there.   Kerry Hough, EMT-Paramedic  01/31/20

## 2020-02-02 ENCOUNTER — Ambulatory Visit (HOSPITAL_COMMUNITY): Payer: Medicaid Other

## 2020-02-02 ENCOUNTER — Telehealth (HOSPITAL_COMMUNITY): Payer: Self-pay

## 2020-02-02 NOTE — Telephone Encounter (Signed)
Pt called me this morning stating he has been up all night vomiting and chills and he is unable to make his appointments today. He asked for the phone number to office to let them know, I gave it to him for him to call.   Kerry Hough, EMT-Paramedic  02/02/20

## 2020-02-11 ENCOUNTER — Ambulatory Visit: Payer: Medicaid Other

## 2020-02-11 DIAGNOSIS — R5383 Other fatigue: Secondary | ICD-10-CM

## 2020-02-13 ENCOUNTER — Other Ambulatory Visit: Payer: Self-pay

## 2020-02-13 ENCOUNTER — Institutional Professional Consult (permissible substitution): Payer: Medicaid Other | Admitting: Internal Medicine

## 2020-02-13 DIAGNOSIS — I6523 Occlusion and stenosis of bilateral carotid arteries: Secondary | ICD-10-CM

## 2020-02-15 ENCOUNTER — Telehealth: Payer: Self-pay | Admitting: *Deleted

## 2020-02-15 NOTE — Telephone Encounter (Signed)
Informed patient of sleep study results and patient understanding was verbalized. °Patient understands his sleep study showed they have sleep apnea and recommend auto CPAP titration through DME.  Orders have been placed in Epic. Please set 8 week OV with me.   °Pt is aware and agreeable to his results. ° °Upon patient request DME selection is [facility]. °Patient understands he will be contacted by [facility] to set up his cpap. °Patient understands to call if [facility] does not contact him with new setup in a timely manner. °Patient understands they will be called once confirmation has been received from [facility] that they have received their new machine to schedule 10 week follow up appointment. ° °[facility] notified of new cpap order  °Please add to airview °Patient was grateful for the call and thanked me. ° °

## 2020-02-15 NOTE — Procedures (Signed)
    Sleep Study Report Patient Information First Name: Randy Last Name: [name] ID: [id number] Birth Date: [birth year] Age: 59 Gender: Male BMI: 24.3 (W=170 lb, H=5' 10'') Sleep Study Information Study Date: Referring Physician Information [place]: Jan 22, 2020 S/H/A Version: [id number] / 4.1.1553 / 43 Name:   IMPRESSION:  1. Severe obstructive sleep apnea (OSA). (ICD 10 - G47.33) 2. Mild oxyhemoglobin desaturation during the recorded time.  3. Snoring was detected in 20.0% of the study.  4. Cardiac rate was within normal limits.  RECOMMENDATIONS: 1. Clinical correlation of these findings is necessary. The most common symptoms of OSA are snoring, gasping for breath while sleeping, daytime sleepiness, and fatigue.  2. Recommend proceeding with the following:  a. Auto-CPAP therapy with a pressure range of 5 - 20 cmH2O.   3. Close follow-up is necessary to ensure success with CPAP therapy for maximal benefit.  4. Healthy sleep recommendations include: adequate nightly sleep (normal 7-9 hrs/night), avoidance of caffeine after noon and alcohol near bedtime, and maintaining a sleep environment that is cool, dark, and quiet.   5. Weight loss for overweight patients is recommended. Even modest amounts of weight loss can significantly improve the severity of sleep apnea.   6. Snoring recommendations include: weight loss where appropriate, side sleeping, and avoidance of alcohol before bed.   7. Operation of a motor vehicle or dangerous equipment must be avoided when feeling drowsy, excessively sleepy, or mentally fatigued.  Report prepared by:  Electronically Signed: Armanda Magic, MD, [facility], Diplomate ABSM  Feb 10, 2020

## 2020-02-15 NOTE — Telephone Encounter (Signed)
-----   Message from Quintella Reichert, MD sent at 02/15/2020  1:32 PM EDT ----- Please let patient know that they have sleep apnea and recommend auto CPAP titration through DME  Orders have been placed in Epic. Please set 8 week OV with me.

## 2020-02-27 ENCOUNTER — Ambulatory Visit: Payer: Medicaid Other | Admitting: Surgery

## 2020-02-27 ENCOUNTER — Encounter (HOSPITAL_COMMUNITY): Payer: Medicaid Other

## 2020-03-01 ENCOUNTER — Ambulatory Visit: Payer: Medicaid Other

## 2020-03-01 ENCOUNTER — Ambulatory Visit: Payer: Medicaid Other | Admitting: Family Medicine

## 2020-03-01 NOTE — Chronic Care Management (AMB) (Signed)
Care Management   Follow Up Note   03/01/2020 Name: Randy [name] MRN: [id number] DOB: 09-25-1960  Referred by: Melene Plan, MD Reason for referral : Chronic Care Management (Constipation)   Randy [name] is a 59 y.o. year old male who is a primary care patient of Melene Plan, MD. The care management team was consulted for assistance with care management and care coordination needs.    Review of patient status, including review of consultants reports, relevant laboratory and other test results, and collaboration with appropriate care team members and the patient's provider was performed as part of comprehensive patient evaluation and provision of chronic care management services.    SDOH (Social Determinants of Health) assessments performed: No See Care Plan activities for detailed interventions related to [name])     Advanced Directives: See Care Plan and Vynca application for related entries.   Goals Addressed              This Visit's Progress   .  I am having constipation (pt-stated)        CARE PLAN ENTRY (see longitudinal plan of care for additional care plan information)  Current Barriers:  [name] Knowledge Deficits related to constipation as evidenced by patient stating he has not had a bowel movement in 2-3 days   - RNCM was sent an staff message from Kerry Hough a paramedic for the patient who states that he had not had a bowel movement for 2-3 days.  He had been using stool softener and still no relief and was complaining of pain.  She was concerned and wanted to know if something could be set up for him outpatient like an U/S. The patient is adamant about not going to the hospital. I sent the message to his PCP and she suggest the patient could have a telemed visit.  Nurse Case Manager Clinical Goal(s):  [name] Over the next 14 days, patient will verbalize understanding of plan for constipation.  Interventions:  . Inter-disciplinary care team collaboration (see longitudinal  plan of care) . Provided education to patient re: constipation.  Assessed his progress now.  He stated that today he was not having pain but his bowels still were not moving like they were suppose to.  He drank coffee this morning and he had " rabbit pellets" come out.  I advised him that I could get him a telemed visit but he said he has an old phone.  He had no objection to coming into the office.   . Discuss ways to help with constipation by eating fiber, drinking water, drinking his coffee is good, exercise, continue with stool softeners . Reviewed scheduled/upcoming provider appointments including: He has a visit today 03/01/20 at 4 pm with [name] . He stated that he also stated that he wanted to get some information about the covid shot.  I let him know that we do offer the shot in the office and provider would be happy to discuss it with him  Patient Self Care Activities:  . Patient verbalizes understanding of plan Self administers medications as prescribed . Attends all scheduled provider appointments . Performs ADL's independently . Performs IADL's independently . Calls provider office for new concerns or questions   Initial goal documentation         The care management team will reach out to the patient again over the next 14 days.   Juanell Fairly RN, BSN, [facility] Care Management Coordinator [facility] Phone: [phone] Fax: [phone]

## 2020-03-01 NOTE — Patient Instructions (Signed)
Visit Information  Goals Addressed              This Visit's Progress   .  I am having constipation (pt-stated)        CARE PLAN ENTRY (see longitudinal plan of care for additional care plan information)  Current Barriers:  [name] Knowledge Deficits related to constipation as evidenced by patient stating he has not had a bowel movement in 2-3 days   - RNCM was sent an staff message from Kerry Hough a paramedic for the patient who states that he had not had a bowel movement for 2-3 days.  He had been using stool softener and still no relief and was complaining of pain.  She was concerned and wanted to know if something could be set up for him outpatient like an U/S. The patient is adamant about not going to the hospital. I sent the message to his PCP and she suggest the patient could have a telemed visit.  Nurse Case Manager Clinical Goal(s):  [name] Over the next 14 days, patient will verbalize understanding of plan for constipation.  Interventions:  . Inter-disciplinary care team collaboration (see longitudinal plan of care) . Provided education to patient re: constipation.  Assessed his progress now.  He stated that today he was not having pain but his bowels still were not moving like they were suppose to.  He drank coffee this morning and he had " rabbit pellets" come out.  I advised him that I could get him a telemed visit but he said he has an old phone.  He had no objection to coming into the office.   . Discuss ways to help with constipation by eating fiber, drinking water, drinking his coffee is good, exercise, continue with stool softeners . Reviewed scheduled/upcoming provider appointments including: He has a visit today 03/01/20 at 4 pm with [name] . He stated that he also stated that he wanted to get some information about the covid shot.  I let him know that we do offer the shot in the office and provider would be happy to discuss it with him  Patient Self Care Activities:  . Patient  verbalizes understanding of plan Self administers medications as prescribed . Attends all scheduled provider appointments . Performs ADL's independently . Performs IADL's independently . Calls provider office for new concerns or questions   Initial goal documentation        Randy [name] was given information about Care Management services today including:  1. Care Management services include personalized support from designated clinical staff supervised by his physician, including individualized plan of care and coordination with other care providers 2. 24/7 contact phone numbers for assistance for urgent and routine care needs. 3. The patient may stop CCM services at any time (effective at the end of the month) by phone call to the office staff.  Patient agreed to services and verbal consent obtained.   The patient verbalized understanding of instructions provided today and declined a print copy of patient instruction materials.   The care management team will reach out to the patient again over the next 14 days.   Juanell Fairly RN, BSN, [facility] Care Management Coordinator [facility] Phone: [phone] Fax: [phone]

## 2020-03-02 ENCOUNTER — Ambulatory Visit (HOSPITAL_COMMUNITY): Payer: Medicaid Other

## 2020-03-06 ENCOUNTER — Encounter (HOSPITAL_COMMUNITY): Payer: Medicaid Other

## 2020-03-08 ENCOUNTER — Ambulatory Visit: Payer: Medicaid Other

## 2020-03-12 ENCOUNTER — Telehealth (HOSPITAL_COMMUNITY): Payer: Self-pay

## 2020-03-12 NOTE — Telephone Encounter (Signed)
Called pt to f/u on the CPAP. He said he is going Wednesday to get the equipment. Now whether this is a fitting and then he has to come back and get it or they have it in stock he isnt sure yet. But I will f/u either this week or next week.   Kerry Hough, EMT-Paramedic  03/12/20

## 2020-03-14 DIAGNOSIS — G4733 Obstructive sleep apnea (adult) (pediatric): Secondary | ICD-10-CM | POA: Diagnosis not present

## 2020-03-20 ENCOUNTER — Other Ambulatory Visit (HOSPITAL_COMMUNITY): Payer: Self-pay

## 2020-03-20 NOTE — Progress Notes (Signed)
Paramedicine Encounter    Patient ID: Randy [name], male    DOB: [birth year], 59 y.o.   MRN: [id number]   Patient Care Team: Melene Plan, MD as PCP - General (Family Medicine) Bensimhon, Bevelyn Buckles, MD as PCP - Cardiology (Cardiology) Allayne Butcher, PA-C as Physician Assistant (Cardiology) Marvel Plan, MD as Consulting Physician (Neurology) Melene Plan, MD (Family Medicine) Juanell Fairly, RN as [facility] Care Management Juanell Fairly, RN as Case Manager  Patient Active Problem List   Diagnosis Date Noted  . Prolonged QT interval 07/20/2019  . Chronic kidney disease (CKD), stage III (moderate) 07/01/2018  . CAD (coronary atherosclerotic disease) 07/01/2018  . H/O ETOH abuse 07/01/2018  . NSTEMI (non-ST elevated myocardial infarction) (HCC) 05/12/2018  . Carotid stenosis, right   . History of tobacco abuse 10/23/2017  . Hyperkalemia 09/18/2017  . Chronic HFrEF (heart failure with reduced ejection fraction) (HCC) 07/08/2017  . 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 HTN (hypertension) 06/15/2014  . HLD (hyperlipidemia) 04/05/2014  . PVD (peripheral vascular disease) with claudication (HCC) 05/09/2013  . AAA (abdominal aortic aneurysm) without rupture (HCC) 10/28/2012  . Generalized anxiety disorder 02/12/2012  . Carotid occlusion, left 01/19/2012  . Pulmonary nodule 05/19/2011  . CAD, ARTERY BYPASS GRAFT 10/19/2008    Current Outpatient Medications:  .  aspirin EC 81 MG tablet, Take 1 tablet (81 mg total) by mouth daily., Disp: 30 tablet, Rfl: 2 .  atorvastatin (LIPITOR) 80 MG tablet, Take 1 tablet (80 mg total) by mouth every evening., Disp: 90 tablet, Rfl: 3 .  carvedilol (COREG) 12.5 MG tablet, Take 1 tablet (12.5 mg total) by mouth 2 (two) times daily with a meal., Disp: 60 tablet, Rfl: 3 .  citalopram (CELEXA) 20 MG tablet, Take 1 tablet (20 mg total) by mouth daily., Disp: 90 tablet, Rfl: 3 .  clopidogrel (PLAVIX) 75  MG tablet, TAKE ONE TABLET BY MOUTH ONCE DAILY WITH BREAKFAST, Disp: 30 tablet, Rfl: 5 .  dapagliflozin propanediol (FARXIGA) 10 MG TABS tablet, Take 1 tablet (10 mg total) by mouth daily before breakfast., Disp: 30 tablet, Rfl: 6 .  isosorbide mononitrate (IMDUR) 60 MG 24 hr tablet, TAKE ONE TABLET BY MOUTH DAILY., Disp: 30 tablet, Rfl: 5 .  lisinopril (ZESTRIL) 10 MG tablet, Take 1 tablet (10 mg total) by mouth daily., Disp: 30 tablet, Rfl: 11 .  spironolactone (ALDACTONE) 25 MG tablet, TAKE ONE TABLET BY MOUTH DAILY., Disp: 30 tablet, Rfl: 5 .  torsemide (DEMADEX) 20 MG tablet, Take 1 tablet (20 mg total) by mouth daily as needed., Disp: , Rfl:  Allergies  Allergen Reactions  . Entresto [Sacubitril-Valsartan] Nausea Only and Other (See Comments)    Dizziness and "sick on stomach"      Social History   Socioeconomic History  . Marital status: Unknown    Spouse name: Not on file  . Number of children: 1  . Years of education: 9th  . Highest education level: Not on file  Occupational History  . Occupation: not working.   Tobacco Use  . Smoking status: Former Smoker    Packs/day: 0.50    Years: 30.00    Pack years: 15.00    Types: Cigarettes    Quit date: 05/04/2018    Years since quitting: 1.8  . Smokeless tobacco: Never Used  Vaping Use  . Vaping Use: Never used  Substance and Sexual Activity  . Alcohol use: Yes    Comment: daily  .  Drug use: Not Currently    Types: Marijuana  . Sexual activity: Yes  Other Topics Concern  . Not on file  Social History Narrative   Pt is widowed and lives alone.   Patient is right handed   Patient drinks about 2cups of caffeine daily.      Social Determinants of Health   Financial Resource Strain:   . Difficulty of Paying Living Expenses: Not on file  Food Insecurity:   . Worried About Programme researcher, broadcasting/film/video in the Last Year: Not on file  . Ran Out of Food in the Last Year: Not on file  Transportation Needs:   . Lack of  Transportation (Medical): Not on file  . Lack of Transportation (Non-Medical): Not on file  Physical Activity:   . Days of Exercise per Week: Not on file  . Minutes of Exercise per Session: Not on file  Stress:   . Feeling of Stress : Not on file  Social Connections:   . Frequency of Communication with Friends and Family: Not on file  . Frequency of Social Gatherings with Friends and Family: Not on file  . Attends Religious Services: Not on file  . Active Member of Clubs or Organizations: Not on file  . Attends Banker Meetings: Not on file  . Marital Status: Not on file  Intimate Partner Violence:   . Fear of Current or Ex-Partner: Not on file  . Emotionally Abused: Not on file  . Physically Abused: Not on file  . Sexually Abused: Not on file    Physical Exam      Future Appointments  Date Time Provider Department Center  03/29/2020 10:00 AM [facility]  03/29/2020 11:00 AM [facility]    BP 110/68   Pulse (!) 56   Temp 97.7 F (36.5 C)   Resp 18   Wt 158 lb (71.7 kg)   SpO2 98%   BMI 22.67 kg/m   Weight yesterday-? Last visit weight-163  Pt reports he is doing good. He began to get sob so he started using his torsemide again, 1 tablet daily.  Since he has restarted that he feels better.  He also started the digoxin again when he got sob with his torsemide, I told him that was stopped and the relief he got was from the torsemide not the digoxin. So he should continue the torsemide as needed and to stop the digoxin.  He got his CPAP equipment.  He has been using it intermittently. He said he needs water for it and he ran out.  He reports he has not used alcohol for the past 2 months.  meds verified.  He has been taking his carvedilol 1/2 tab as a whole tab daily b/c he was scared he would miss his PM dose. I told hm to do like the instructions said--1/2 tab BID. He said he would.  He found his SSI statement and  his income is $35.  Right now, he has all equipment needed for CPAP other than water and he will get that. He has all meds, co-pay are waived, option for delivery of meds and no issues getting meds.  He can now be graduated from [employer].   Kerry Hough, EMT-Paramedic [phone] [facility] Paramedic  03/20/20

## 2020-03-27 NOTE — Chronic Care Management (AMB) (Signed)
Care Management   Follow Up Note   03/27/2020 Name: Randy [name] MRN: [id number] DOB: Mar 05, 1961  Referred by: Melene Plan, MD Reason for referral : Appointment (Follow up)   Randy [name] is a 59 y.o. year old male who is a primary care patient of Selena Batten Vinnie Langton, MD. The care management team was consulted for assistance with care management and care coordination needs.    Review of patient status, including review of consultants reports, relevant laboratory and other test results, and collaboration with appropriate care team members and the patient's provider was performed as part of comprehensive patient evaluation and provision of chronic care management services.    SDOH (Social Determinants of Health) assessments performed: No See Care Plan activities for detailed interventions related to [facility])     Advanced Directives: See Care Plan and Vynca application for related entries.   Goals Addressed              This Visit's Progress   .  COMPLETED: I am having constipation (pt-stated)        CARE PLAN ENTRY (see longitudinal plan of care for additional care plan information)  Current Barriers:  [name] Knowledge Deficits related to constipation as evidenced by patient stating he has not had a bowel movement in 2-3 days   - RNCM was sent an staff message from Kerry Hough a paramedic for the patient who states that he had not had a bowel movement for 2-3 days.  He had been using stool softener and still no relief and was complaining of pain.  She was concerned and wanted to know if something could be set up for him outpatient like an U/S. The patient is adamant about not going to the hospital. I sent the message to his PCP and she suggest the patient could have a telemed visit.  Nurse Case Manager Clinical Goal(s):  [name] Over the next 14 days, patient will verbalize understanding of plan for constipation.  Interventions:  . Inter-disciplinary care team collaboration (see longitudinal plan  of care) . Provided education to patient re: constipation.  Assessed his progress now.  He stated that today he was not having pain but his bowels still were not moving like they were suppose to.  He drank coffee this morning and he had " rabbit pellets" come out.  I advised him that I could get him a telemed visit but he said he has an old phone.  He had no objection to coming into the office.   . Discuss ways to help with constipation by eating fiber, drinking water, drinking his coffee is good, exercise, continue with stool softeners . Reviewed scheduled/upcoming provider appointments including: He has a visit today 03/01/20 at 4 pm with [name] . He stated that he also stated that he wanted to get some information about the covid shot.  I let him know that we do offer the shot in the office and provider would be happy to discuss it with him . Followed up with th epatient and he stated that he did not keep the appointment .  He stated that he felt better and he was able to move his bowels.  He appreciated me calling. . I encouraged him that he has my contact numbers and if he needs me in the future to feel free to give me a call.  Patient Self Care Activities:  . Patient verbalizes understanding of plan Self administers medications as prescribed . Attends all scheduled provider appointments . Performs  ADL's independently . Performs IADL's independently . Calls provider office for new concerns or questions   Please see past updates related to this goal by clicking on the "Past Updates" button in the selected goal          No further follow up required:   Juanell Fairly RN, BSN, [facility] Care Management Coordinator [facility] Phone: [phone] Fax: [phone]

## 2020-03-27 NOTE — Patient Instructions (Signed)
Visit Information  Goals Addressed              This Visit's Progress   .  COMPLETED: I am having constipation (pt-stated)        CARE PLAN ENTRY (see longitudinal plan of care for additional care plan information)  Current Barriers:  [name] Knowledge Deficits related to constipation as evidenced by patient stating he has not had a bowel movement in 2-3 days   - RNCM was sent an staff message from Kerry Hough a paramedic for the patient who states that he had not had a bowel movement for 2-3 days.  He had been using stool softener and still no relief and was complaining of pain.  She was concerned and wanted to know if something could be set up for him outpatient like an U/S. The patient is adamant about not going to the hospital. I sent the message to his PCP and she suggest the patient could have a telemed visit.  Nurse Case Manager Clinical Goal(s):  [name] Over the next 14 days, patient will verbalize understanding of plan for constipation.  Interventions:  . Inter-disciplinary care team collaboration (see longitudinal plan of care) . Provided education to patient re: constipation.  Assessed his progress now.  He stated that today he was not having pain but his bowels still were not moving like they were suppose to.  He drank coffee this morning and he had " rabbit pellets" come out.  I advised him that I could get him a telemed visit but he said he has an old phone.  He had no objection to coming into the office.   . Discuss ways to help with constipation by eating fiber, drinking water, drinking his coffee is good, exercise, continue with stool softeners . Reviewed scheduled/upcoming provider appointments including: He has a visit today 03/01/20 at 4 pm with [name] . He stated that he also stated that he wanted to get some information about the covid shot.  I let him know that we do offer the shot in the office and provider would be happy to discuss it with him . Followed up with th epatient  and he stated that he did not keep the appointment .  He stated that he felt better and he was able to move his bowels.  He appreciated me calling. . I encouraged him that he has my contact numbers and if he needs me in the future to feel free to give me a call.  Patient Self Care Activities:  . Patient verbalizes understanding of plan Self administers medications as prescribed . Attends all scheduled provider appointments . Performs ADL's independently . Performs IADL's independently . Calls provider office for new concerns or questions   Please see past updates related to this goal by clicking on the "Past Updates" button in the selected goal         Mr. Stage was given information about Care Management services today including:  1. Care Management services include personalized support from designated clinical staff supervised by his physician, including individualized plan of care and coordination with other care providers 2. 24/7 contact phone numbers for assistance for urgent and routine care needs. 3. The patient may stop CCM services at any time (effective at the end of the month) by phone call to the office staff.  Patient agreed to services and verbal consent obtained.   The patient verbalized understanding of instructions provided today and declined a print copy of patient instruction materials.  No further follow up required:   Juanell Fairly RN, BSN, [facility] Care Management Coordinator [facility] Phone: [phone] Fax: [phone]

## 2020-03-29 ENCOUNTER — Ambulatory Visit (HOSPITAL_COMMUNITY)
Admission: RE | Admit: 2020-03-29 | Discharge: 2020-03-29 | Disposition: A | Discharge status: Home / Self Care | Payer: Medicaid Other | Source: Ambulatory Visit | Attending: Cardiology | Admitting: Cardiology

## 2020-03-29 ENCOUNTER — Ambulatory Visit (HOSPITAL_BASED_OUTPATIENT_CLINIC_OR_DEPARTMENT_OTHER)
Admission: RE | Admit: 2020-03-29 | Discharge: 2020-03-29 | Disposition: A | Discharge status: Home / Self Care | Payer: Medicaid Other | Source: Ambulatory Visit | Attending: Internal Medicine | Admitting: Internal Medicine

## 2020-03-29 ENCOUNTER — Other Ambulatory Visit: Payer: Self-pay

## 2020-03-29 DIAGNOSIS — I714 Abdominal aortic aneurysm, without rupture, unspecified: Secondary | ICD-10-CM

## 2020-03-29 DIAGNOSIS — I739 Peripheral vascular disease, unspecified: Secondary | ICD-10-CM | POA: Insufficient documentation

## 2020-03-30 DIAGNOSIS — G4733 Obstructive sleep apnea (adult) (pediatric): Secondary | ICD-10-CM | POA: Diagnosis not present

## 2020-04-02 ENCOUNTER — Telehealth (HOSPITAL_COMMUNITY): Payer: Self-pay

## 2020-04-02 DIAGNOSIS — I739 Peripheral vascular disease, unspecified: Secondary | ICD-10-CM

## 2020-04-02 NOTE — Telephone Encounter (Signed)
Samara Snide, RN  04/02/2020 1:20 PM EDT Back to Top    Patient aware and verbalized understanding. Referral placed

## 2020-04-02 NOTE — Telephone Encounter (Signed)
-----   Message from Dolores Patty, MD sent at 03/31/2020  5:18 PM EDT ----- Please refer to [facility]

## 2020-04-10 ENCOUNTER — Telehealth: Payer: Self-pay | Admitting: *Deleted

## 2020-04-10 NOTE — Telephone Encounter (Signed)
-----   Message from Quintella Reichert, MD sent at 04/07/2020  4:51 PM EDT ----- Please set up visit with me to discuss optios ----- Message ----- From: Reesa Chew, CMA Sent: 04/06/2020   2:09 PM EDT To: Quintella Reichert, MD  Patient says he can't sleep with nothing over his face.because he feels like he is suffocating and he is not comfortable with the unit.[name] He ask why he can't just sleep with a humidifier. He is thinking about giving his cpap back. I ask him to please wait until I talk to the doctor.  Please advise

## 2020-04-10 NOTE — Telephone Encounter (Signed)
Appointment made for 05/01/20 at 3pm.

## 2020-04-12 NOTE — Telephone Encounter (Addendum)
Per [facility] patient missed 3 appointments to get a new mask. Since September 15th has used only 2 days for 2 minutes. [facility] will not reschedule him again. [facility] will pick his machine up.

## 2020-04-18 ENCOUNTER — Other Ambulatory Visit (HOSPITAL_COMMUNITY): Payer: Self-pay | Admitting: Internal Medicine

## 2020-04-23 ENCOUNTER — Other Ambulatory Visit: Payer: Self-pay

## 2020-04-23 DIAGNOSIS — I6523 Occlusion and stenosis of bilateral carotid arteries: Secondary | ICD-10-CM

## 2020-04-30 DIAGNOSIS — G4733 Obstructive sleep apnea (adult) (pediatric): Secondary | ICD-10-CM | POA: Diagnosis not present

## 2020-05-01 ENCOUNTER — Telehealth: Payer: Medicaid Other | Admitting: Cardiology

## 2020-05-07 ENCOUNTER — Ambulatory Visit: Payer: Medicaid Other | Admitting: Surgery

## 2020-05-07 ENCOUNTER — Telehealth (HOSPITAL_COMMUNITY): Payer: Self-pay

## 2020-05-07 ENCOUNTER — Encounter (HOSPITAL_COMMUNITY): Payer: Medicaid Other

## 2020-05-07 NOTE — Telephone Encounter (Signed)
Pt called me concerned about his equipment and that he is not able to use his mask b/c it feels suffocating.  I advised him to take it back to where he got his supplies from and tell them that.  It looks like he has been in contact with someone at the primary cardiologist office and he was getting me confused with them.  He thought a humidifier would do just as good so I had to explain the differences b/c he was really confused about that.  He said he would go there ASAP.   Kerry Hough, EMT-Paramedic  05/07/20

## 2020-05-16 ENCOUNTER — Other Ambulatory Visit (HOSPITAL_COMMUNITY): Payer: Self-pay | Admitting: Internal Medicine

## 2020-05-30 DIAGNOSIS — G4733 Obstructive sleep apnea (adult) (pediatric): Secondary | ICD-10-CM | POA: Diagnosis not present

## 2020-06-20 ENCOUNTER — Other Ambulatory Visit (HOSPITAL_COMMUNITY): Payer: Self-pay | Admitting: Adult Health

## 2020-07-02 ENCOUNTER — Ambulatory Visit: Payer: Medicaid Other | Admitting: Surgery

## 2020-07-02 ENCOUNTER — Encounter (HOSPITAL_COMMUNITY): Payer: Medicaid Other

## 2020-08-03 DIAGNOSIS — R404 Transient alteration of awareness: Secondary | ICD-10-CM | POA: Diagnosis not present

## 2020-08-03 DIAGNOSIS — Z743 Need for continuous supervision: Secondary | ICD-10-CM | POA: Diagnosis not present

## 2020-08-28 DIAGNOSIS — 419620001 Death: Secondary | SNOMED CT | POA: Diagnosis not present

## 2020-08-28 DEATH — deceased
